# Patient Record
Sex: Female | Born: 1960 | Race: White | Hispanic: No | Marital: Single | Smoking: Current every day smoker
Health system: Southern US, Community
[De-identification: ages and names within clinical notes are randomized; demographics above are authoritative.]

## PROBLEM LIST (undated history)

## (undated) DIAGNOSIS — J449 Chronic obstructive pulmonary disease, unspecified: Secondary | ICD-10-CM

## (undated) DIAGNOSIS — N390 Urinary tract infection, site not specified: Secondary | ICD-10-CM

## (undated) DIAGNOSIS — F32A Depression, unspecified: Secondary | ICD-10-CM

## (undated) DIAGNOSIS — K219 Gastro-esophageal reflux disease without esophagitis: Secondary | ICD-10-CM

## (undated) DIAGNOSIS — F329 Major depressive disorder, single episode, unspecified: Secondary | ICD-10-CM

## (undated) DIAGNOSIS — B019 Varicella without complication: Secondary | ICD-10-CM

## (undated) DIAGNOSIS — J45909 Unspecified asthma, uncomplicated: Secondary | ICD-10-CM

## (undated) DIAGNOSIS — R51 Headache: Secondary | ICD-10-CM

## (undated) DIAGNOSIS — I10 Essential (primary) hypertension: Secondary | ICD-10-CM

## (undated) DIAGNOSIS — R519 Headache, unspecified: Secondary | ICD-10-CM

## (undated) HISTORY — DX: Unspecified asthma, uncomplicated: J45.909

## (undated) HISTORY — DX: Chronic obstructive pulmonary disease, unspecified: J44.9

## (undated) HISTORY — DX: Major depressive disorder, single episode, unspecified: F32.9

## (undated) HISTORY — PX: TONSILLECTOMY: SUR1361

## (undated) HISTORY — PX: ESOPHAGEAL DILATION: SHX303

## (undated) HISTORY — DX: Headache: R51

## (undated) HISTORY — DX: Varicella without complication: B01.9

## (undated) HISTORY — DX: Headache, unspecified: R51.9

## (undated) HISTORY — DX: Depression, unspecified: F32.A

## (undated) HISTORY — DX: Gastro-esophageal reflux disease without esophagitis: K21.9

## (undated) HISTORY — DX: Urinary tract infection, site not specified: N39.0

---

## 2003-03-16 ENCOUNTER — Encounter: Admission: RE | Admit: 2003-03-16 | Discharge: 2003-06-14 | Payer: Self-pay | Admitting: Surgical Oncology

## 2005-03-20 ENCOUNTER — Emergency Department (HOSPITAL_COMMUNITY): Admission: EM | Admit: 2005-03-20 | Discharge: 2005-03-21 | Payer: Self-pay | Admitting: *Deleted

## 2005-12-25 ENCOUNTER — Emergency Department (HOSPITAL_COMMUNITY): Admission: EM | Admit: 2005-12-25 | Discharge: 2005-12-25 | Payer: Self-pay | Admitting: Emergency Medicine

## 2005-12-25 IMAGING — CT CT HEAD W/O CM
1 series · 16 of 28 positions shown, 20 images · IV contrast (agent unspecified)
Comparison: none

CLINICAL DATA: Severe headache for a day. 
 HEAD CT WITHOUT CONTRAST:
TECHNIQUE: Contiguous axial images were obtained from the base of the skull through the vertex according to standard protocol without contrast.
 There is no evidence of intracranial hemorrhage, brain edema, or mass effect.  No other intra-axial abnormalities are seen, and the ventricles are within normal limits.  No abnormal extra-axial fluid collections or masses are identified.  No skull abnormalities are noted.

[Series 751: — · axial · 0.43mm/px · z∈[-653,-528]mm · 16 of 28 slices shown, 20 images]
[im 2/28  brain]
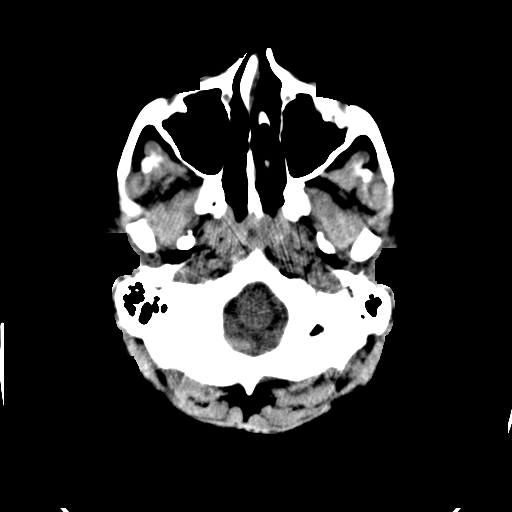
[im 2/28  bone]
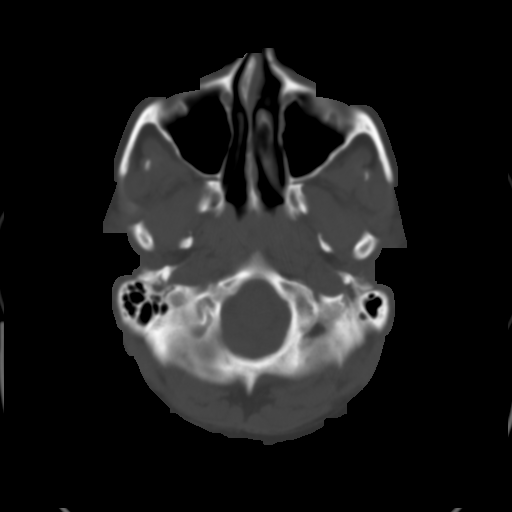
[im 4/28  brain]
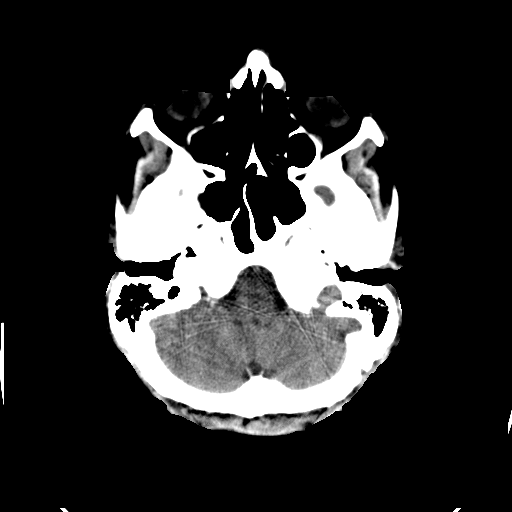
[im 6/28  brain]
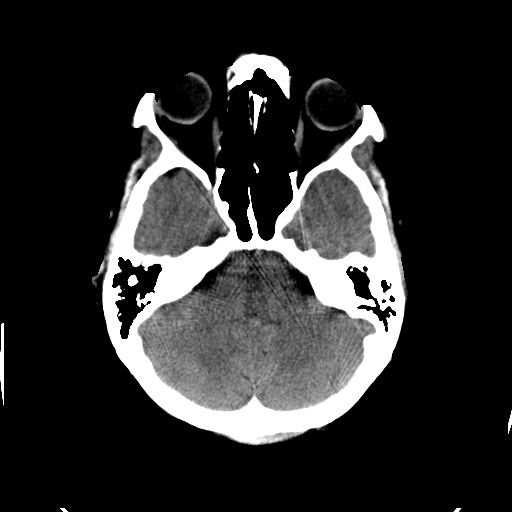
[im 7/28  brain]
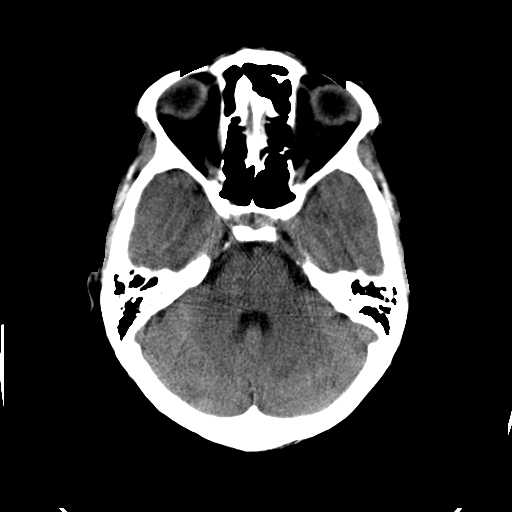
[im 9/28  brain]
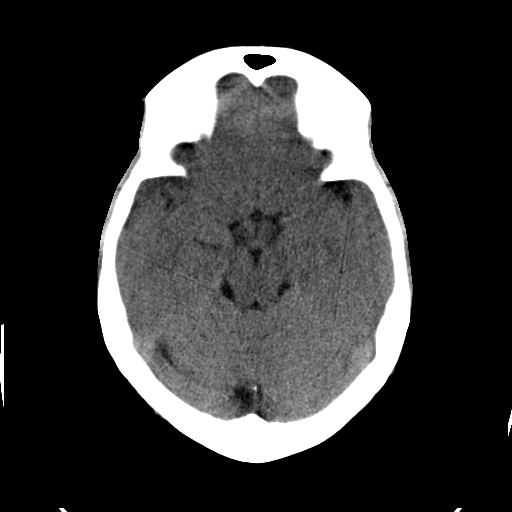
[im 9/28  bone]
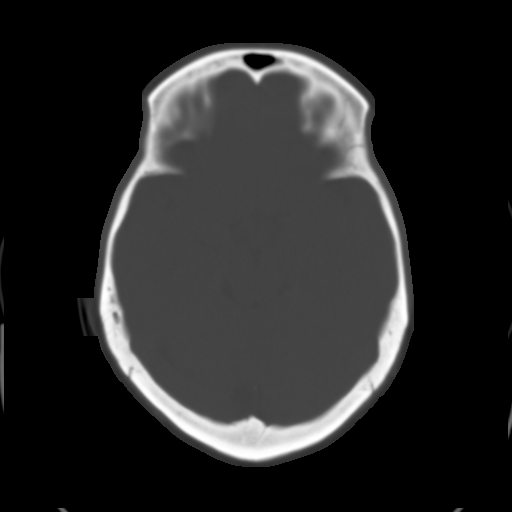
[im 10/28  brain]
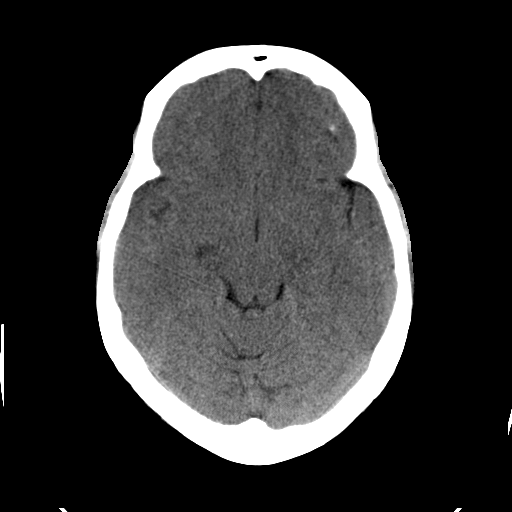
[im 12/28  brain]
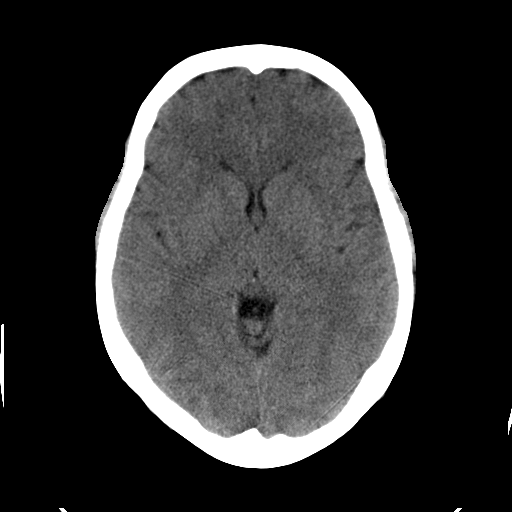
[im 14/28  brain]
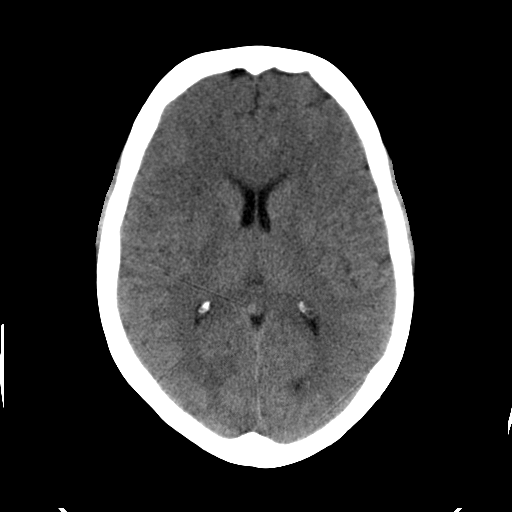
[im 15/28  brain]
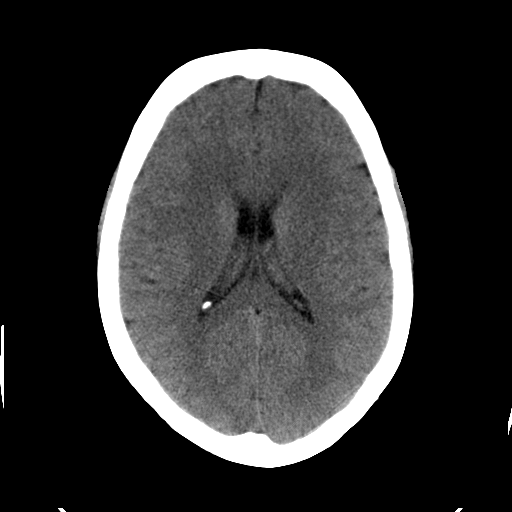
[im 15/28  bone]
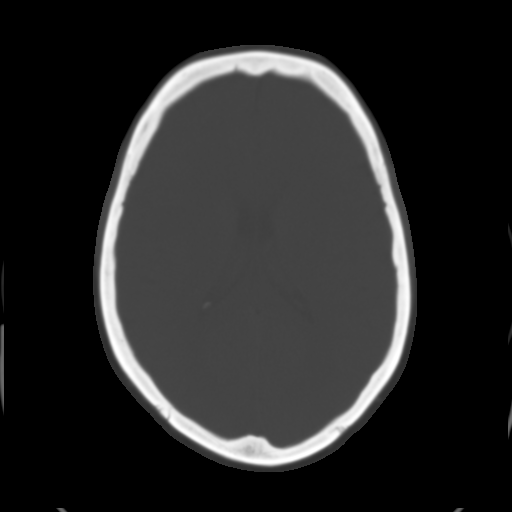
[im 17/28  brain]
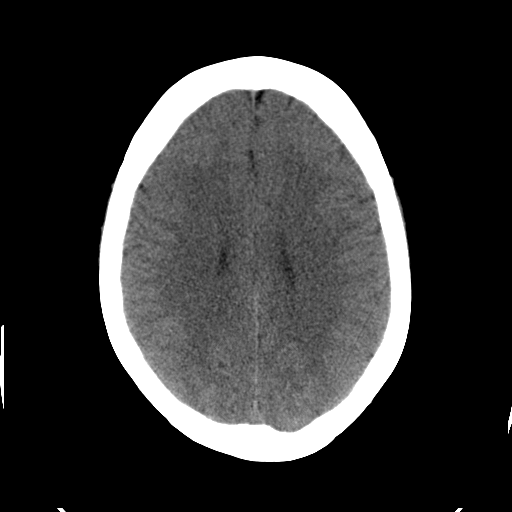
[im 19/28  brain]
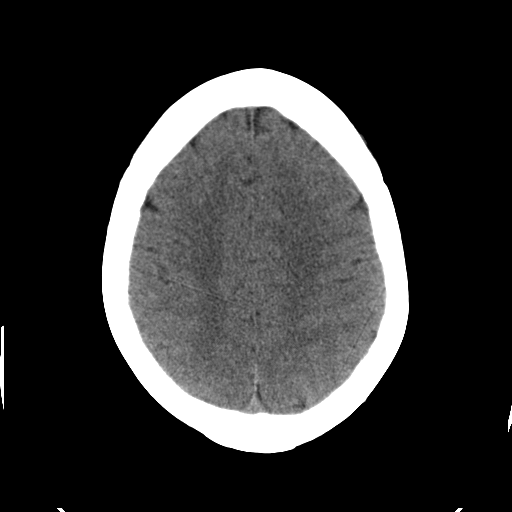
[im 20/28  brain]
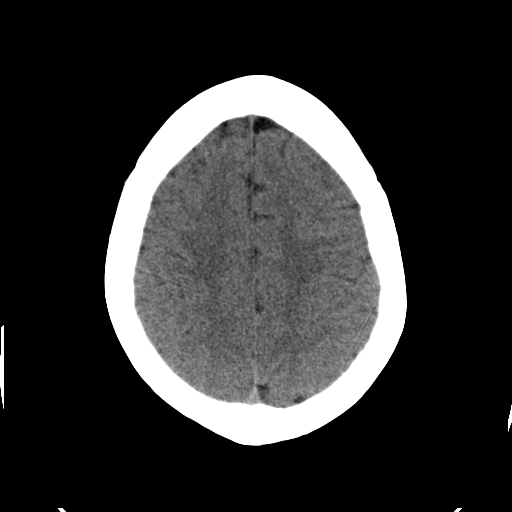
[im 22/28  brain]
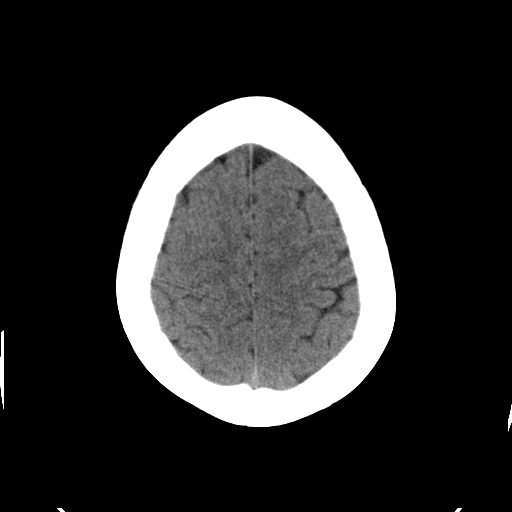
[im 22/28  bone]
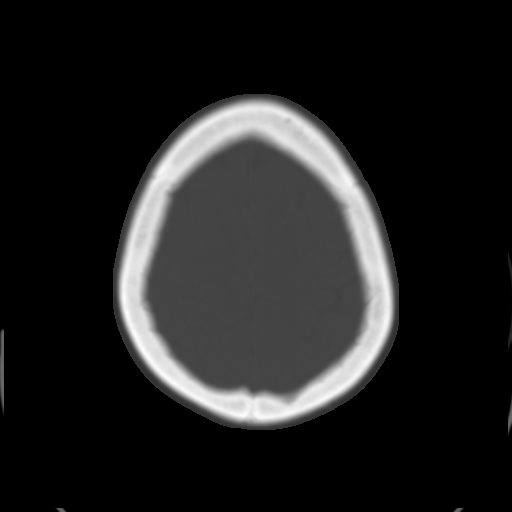
[im 23/28  brain]
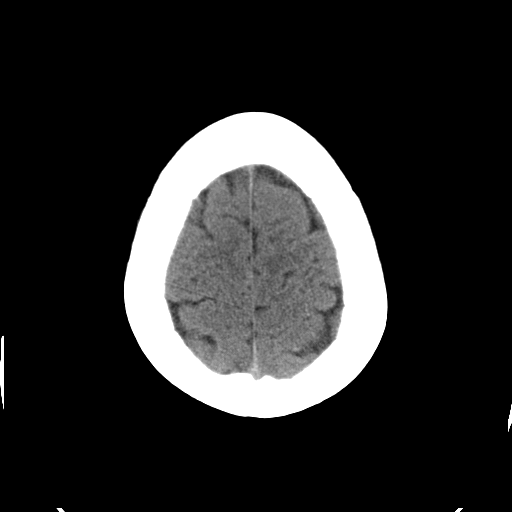
[im 25/28  brain]
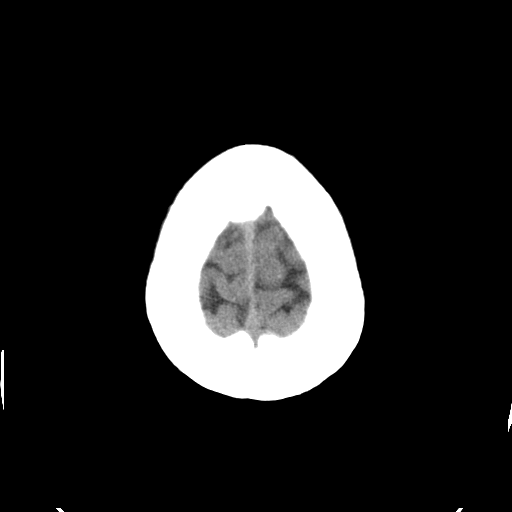
[im 27/28  brain]
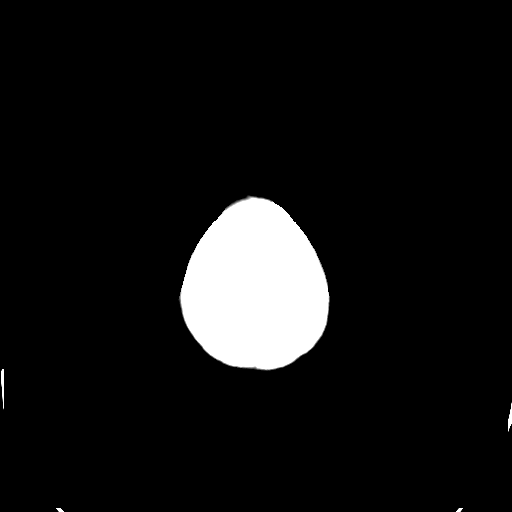

[16 of 28 positions shown; findings below may reference images not displayed]

IMPRESSION: Negative non-contrast head CT.

## 2010-12-13 ENCOUNTER — Emergency Department (HOSPITAL_COMMUNITY)
Admission: EM | Admit: 2010-12-13 | Discharge: 2010-12-13 | Disposition: A | Discharge status: Home / Self Care | Payer: Self-pay | Attending: Emergency Medicine | Admitting: Emergency Medicine

## 2010-12-13 DIAGNOSIS — H6690 Otitis media, unspecified, unspecified ear: Secondary | ICD-10-CM | POA: Insufficient documentation

## 2010-12-13 DIAGNOSIS — B850 Pediculosis due to Pediculus humanus capitis: Secondary | ICD-10-CM | POA: Insufficient documentation

## 2010-12-13 DIAGNOSIS — J45909 Unspecified asthma, uncomplicated: Secondary | ICD-10-CM | POA: Insufficient documentation

## 2010-12-13 DIAGNOSIS — R509 Fever, unspecified: Secondary | ICD-10-CM | POA: Insufficient documentation

## 2010-12-13 DIAGNOSIS — R51 Headache: Secondary | ICD-10-CM | POA: Insufficient documentation

## 2013-01-27 DIAGNOSIS — R55 Syncope and collapse: Secondary | ICD-10-CM

## 2013-10-06 ENCOUNTER — Encounter: Payer: Self-pay | Admitting: Physician Assistant

## 2013-10-06 ENCOUNTER — Ambulatory Visit (INDEPENDENT_AMBULATORY_CARE_PROVIDER_SITE_OTHER): Payer: Self-pay | Admitting: Physician Assistant

## 2013-10-06 VITALS — BP 108/72 | HR 84 | Temp 97.6°F | Resp 18 | Ht 61.5 in | Wt 167.0 lb

## 2013-10-06 DIAGNOSIS — F411 Generalized anxiety disorder: Secondary | ICD-10-CM

## 2013-10-06 DIAGNOSIS — J449 Chronic obstructive pulmonary disease, unspecified: Secondary | ICD-10-CM

## 2013-10-06 DIAGNOSIS — Z23 Encounter for immunization: Secondary | ICD-10-CM

## 2013-10-06 DIAGNOSIS — Z0000 Encounter for general adult medical examination without abnormal findings: Secondary | ICD-10-CM

## 2013-10-06 DIAGNOSIS — K219 Gastro-esophageal reflux disease without esophagitis: Secondary | ICD-10-CM

## 2013-10-06 DIAGNOSIS — Z720 Tobacco use: Secondary | ICD-10-CM

## 2013-10-06 DIAGNOSIS — F17200 Nicotine dependence, unspecified, uncomplicated: Secondary | ICD-10-CM

## 2013-10-06 MED ORDER — UMECLIDINIUM-VILANTEROL 62.5-25 MCG/INH IN AEPB: 1.0000 | INHALATION_SPRAY | Freq: Every day | RESPIRATORY_TRACT | Status: DC

## 2013-10-06 MED ORDER — SERTRALINE HCL 50 MG PO TABS: 50.0000 mg | ORAL_TABLET | Freq: Every day | ORAL | Status: DC

## 2013-10-06 MED ORDER — ALPRAZOLAM 0.25 MG PO TABS: 0.2500 mg | ORAL_TABLET | Freq: Two times a day (BID) | ORAL | Status: DC | PRN

## 2013-10-06 NOTE — Progress Notes (Signed)
Pre visit review using our clinic review tool, if applicable. No additional management support is needed unless otherwise documented below in the visit note/SLS  

## 2013-10-06 NOTE — Patient Instructions (Signed)
It was great to meet you today Michaela Wells  Labs have been ordered for you, when you report to lab please be fasting.     Chronic Obstructive Pulmonary Disease Chronic obstructive pulmonary disease (COPD) is a common lung problem. In COPD, the flow of air from the lungs is limited. The way your lungs work will probably never return to normal, but there are things you can do to improve you lungs and make yourself feel better. HOME CARE  Take all medicines as told by your doctor.  Only take over-the-counter or prescription medicines as told by your doctor.  Avoid medicines or cough syrups that dry up your airway (such as antihistamines) and do not allow you to get rid of thick spit. You do not need to avoid them if told differently by your doctor.  If you smoke, stop. Smoking makes the problem worse.  Avoid being around things that make your breathing worse (like smoke, chemicals, and fumes).  Use oxygen therapy and therapy to help improve your lungs (pulmonary rehabilitation) if told by your doctor. If you need home oxygen therapy, ask your doctor if you should buy a tool to measure your oxygen level (oximeter).  Avoid people who have a sickness you can catch (contagious).  Avoid going outside when it is very hot, cold, or humid.  Eat healthy foods. Eat smaller meals more often. Rest before meals.  Stay active, but remember to also rest.  Make sure to get all the shots (vaccines) your doctor recommends. Ask your doctor if you need a pneumonia shot.  Learn and use tips on how to relax.  Learn and use tips on how to control your breathing as told by your doctor. Try:  Breathing in (inhaling) through your nose for 1 second. Then, pucker your lips and breath out (exhale) through your lips for 2 seconds.  Putting one hand on your belly (abdomen). Breathe in slowly through your nose for 1 second. Your hand on your belly should move out. Pucker your lips and breathe out slowly through  your lips. Your hand on your belly should move in as you breathe out.  Learn and use controlled coughing to clear thick spit from your lungs. 1. Lean your head a little forward. 2. Breathe in deeply. 3. Try to hold your breath for 3 seconds. 4. Keep your mouth slightly open while coughing 2 times. 5. Spit any thick spit out into a tissue. 6. Rest and do the steps again 1 or 2 times as needed. GET HELP IF:  You cough up more thick spit than usual.  There is a change in the color or thickness of the spit.  It is harder to breathe than usual.  Your breathing is faster than usual. GET HELP RIGHT AWAY IF:   You have shortness of breath while resting.  You have shortness of breath that stops you from:  Being able to talk.  Doing normal activities.  You chest hurts for longer than 5 minutes.  Your skin color is more blue than usual.  Your pulse oximeter shows that you have low oxygen for longer than 5 minutes. MAKE SURE YOU:   Understand these instructions.  Will watch your condition.  Will get help right away if you are not doing well or get worse. Document Released: 01/29/2008 Document Revised: 06/02/2013 Document Reviewed: 04/08/2013 [facility] Patient Information 2014 [place], [place].  Generalized Anxiety Disorder Generalized anxiety disorder (GAD) is a mental disorder. It interferes with life functions, including relationships,  work, and school. GAD is different from normal anxiety, which everyone experiences at some point in their lives in response to specific life events and activities. Normal anxiety actually helps [place] prepare for and get through these life events and activities. Normal anxiety goes away after the event or activity is over.  GAD causes anxiety that is not necessarily related to specific events or activities. It also causes excess anxiety in proportion to specific events or activities. The anxiety associated with GAD is also difficult to control. GAD can  vary from mild to severe. People with severe GAD can have intense waves of anxiety with physical symptoms (panic attacks).  SYMPTOMS The anxiety and worry associated with GAD are difficult to control. This anxiety and worry are related to many life events and activities and also occur more days than not for 6 months or longer. People with GAD also have three or more of the following symptoms (one or more in children):  Restlessness.   Fatigue.  Difficulty concentrating.   Irritability.  Muscle tension.  Difficulty sleeping or unsatisfying sleep. DIAGNOSIS GAD is diagnosed through an assessment by your caregiver. Your caregiver will ask you questions aboutyour mood,physical symptoms, and events in your life. Your caregiver may ask you about your medical history and use of alcohol or drugs, including prescription medications. Your caregiver may also do a physical exam and blood tests. Certain medical conditions and the use of certain substances can cause symptoms similar to those associated with GAD. Your caregiver may refer you to a mental health specialist for further evaluation. TREATMENT The following therapies are usually used to treat GAD:   Medication Antidepressant medication usually is prescribed for long-term daily control. Antianxiety medications may be added in severe cases, especially when panic attacks occur.   Talk therapy (psychotherapy) Certain types of talk therapy can be helpful in treating GAD by providing support, education, and guidance. A form of talk therapy called cognitive behavioral therapy can teach you healthy ways to think about and react to daily life events and activities.  Stress managementtechniques These include yoga, meditation, and exercise and can be very helpful when they are practiced regularly. A mental health specialist can help determine which treatment is best for you. Some people see improvement with one therapy. However, other people require  a combination of therapies. Document Released: 12/07/2012 Document Reviewed: 12/07/2012 [facility] Patient Information 2014 [place], [place].     Health Maintenance, Female A healthy lifestyle and preventative care can promote health and wellness.  Maintain regular health, dental, and eye exams.  Eat a healthy diet. Foods like vegetables, fruits, whole grains, low-fat dairy products, and lean protein foods contain the nutrients you need without too many calories. Decrease your intake of foods high in solid fats, added sugars, and salt. Get information about a proper diet from your caregiver, if necessary.  Regular physical exercise is one of the most important things you can do for your health. Most adults should get at least 150 minutes of moderate-intensity exercise (any activity that increases your heart rate and causes you to sweat) each week. In addition, most adults need muscle-strengthening exercises on 2 or more days a week.   Maintain a healthy weight. The body mass index (BMI) is a screening tool to identify possible weight problems. It provides an estimate of body fat based on height and weight. Your caregiver can help determine your BMI, and can help you achieve or maintain a healthy weight. For adults 20 years and older:  A BMI below 18.5 is considered underweight.  A BMI of 18.5 to 24.9 is normal.  A BMI of 25 to 29.9 is considered overweight.  A BMI of 30 and above is considered obese.  Maintain normal blood lipids and cholesterol by exercising and minimizing your intake of saturated fat. Eat a balanced diet with plenty of fruits and vegetables. Blood tests for lipids and cholesterol should begin at age 15 and be repeated every 5 years. If your lipid or cholesterol levels are high, you are over 50, or you are a high risk for heart disease, you may need your cholesterol levels checked more frequently.Ongoing high lipid and cholesterol levels should be treated with medicines if  diet and exercise are not effective.  If you smoke, find out from your caregiver how to quit. If you do not use tobacco, do not start.  Lung cancer screening is recommended for adults aged 62 80 years who are at high risk for developing lung cancer because of a history of smoking. Yearly low-dose computed tomography (CT) is recommended for people who have at least a 30-pack-year history of smoking and are a current smoker or have quit within the past 15 years. A pack year of smoking is smoking an average of 1 pack of cigarettes a day for 1 year (for example: 1 pack a day for 30 years or 2 packs a day for 15 years). Yearly screening should continue until the smoker has stopped smoking for at least 15 years. Yearly screening should also be stopped for people who develop a health problem that would prevent them from having lung cancer treatment.  If you are pregnant, do not drink alcohol. If you are breastfeeding, be very cautious about drinking alcohol. If you are not pregnant and choose to drink alcohol, do not exceed 1 drink per day. One drink is considered to be 12 ounces (355 mL) of beer, 5 ounces (148 mL) of wine, or 1.5 ounces (44 mL) of liquor.  Avoid use of street drugs. Do not share needles with anyone. Ask for help if you need support or instructions about stopping the use of drugs.  High blood pressure causes heart disease and increases the risk of stroke. Blood pressure should be checked at least every 1 to 2 years. Ongoing high blood pressure should be treated with medicines, if weight loss and exercise are not effective.  If you are 69 to 54 years old, ask your caregiver if you should take aspirin to prevent strokes.  Diabetes screening involves taking a blood sample to check your fasting blood sugar level. This should be done once every 3 years, after age 53, if you are within normal weight and without risk factors for diabetes. Testing should be considered at a younger age or be carried  out more frequently if you are overweight and have at least 1 risk factor for diabetes.  Breast cancer screening is essential preventative care for women. You should practice "breast self-awareness." This means understanding the normal appearance and feel of your breasts and may include breast self-examination. Any changes detected, no matter how small, should be reported to a caregiver. Women in their 36s and 30s should have a clinical breast exam (CBE) by a caregiver as part of a regular health exam every 1 to 3 years. After age 28, women should have a CBE every year. Starting at age 27, women should consider having a mammogram (breast X-ray) every year. Women who have a family history of breast cancer  should talk to their caregiver about genetic screening. Women at a high risk of breast cancer should talk to their caregiver about having an MRI and a mammogram every year.  Breast cancer gene (BRCA)-related cancer risk assessment is recommended for women who have family members with BRCA-related cancers. BRCA-related cancers include breast, ovarian, tubal, and peritoneal cancers. Having family members with these cancers may be associated with an increased risk for harmful changes (mutations) in the breast cancer genes BRCA1 and BRCA2. Results of the assessment will determine the need for genetic counseling and BRCA1 and BRCA2 testing.  The Pap test is a screening test for cervical cancer. Women should have a Pap test starting at age 47. Between ages 49 and 61, Pap tests should be repeated every 2 years. Beginning at age 13, you should have a Pap test every 3 years as long as the past 3 Pap tests have been normal. If you had a hysterectomy for a problem that was not cancer or a condition that could lead to cancer, then you no longer need Pap tests. If you are between ages 9 and 48, and you have had normal Pap tests going back 10 years, you no longer need Pap tests. If you have had past treatment for cervical  cancer or a condition that could lead to cancer, you need Pap tests and screening for cancer for at least 20 years after your treatment. If Pap tests have been discontinued, risk factors (such as a new sexual partner) need to be reassessed to determine if screening should be resumed. Some women have medical problems that increase the chance of getting cervical cancer. In these cases, your caregiver may recommend more frequent screening and Pap tests.  The human papillomavirus (HPV) test is an additional test that may be used for cervical cancer screening. The HPV test looks for the virus that can cause the cell changes on the cervix. The cells collected during the Pap test can be tested for HPV. The HPV test could be used to screen women aged 62 years and older, and should be used in women of any age who have unclear Pap test results. After the age of 98, women should have HPV testing at the same frequency as a Pap test.  Colorectal cancer can be detected and often prevented. Most routine colorectal cancer screening begins at the age of 80 and continues through age 36. However, your caregiver may recommend screening at an earlier age if you have risk factors for colon cancer. On a yearly basis, your caregiver may provide home test kits to check for hidden blood in the stool. Use of a small camera at the end of a tube, to directly examine the colon (sigmoidoscopy or colonoscopy), can detect the earliest forms of colorectal cancer. Talk to your caregiver about this at age 64, when routine screening begins. Direct examination of the colon should be repeated every 5 to 10 years through age 77, unless early forms of pre-cancerous polyps or small growths are found.  Hepatitis C blood testing is recommended for all people born from [birth year] through 1966 and any individual with known risks for hepatitis C.  Practice safe sex. Use condoms and avoid high-risk sexual practices to reduce the spread of sexually transmitted  infections (STIs). Sexually active women aged 58 and younger should be checked for Chlamydia, which is a common sexually transmitted infection. Older women with new or multiple partners should also be tested for Chlamydia. Testing for other STIs is recommended if  you are sexually active and at increased risk.  Osteoporosis is a disease in which the bones lose minerals and strength with aging. This can result in serious bone fractures. The risk of osteoporosis can be identified using a bone density scan. Women ages 20 and over and women at risk for fractures or osteoporosis should discuss screening with their caregivers. Ask your caregiver whether you should be taking a calcium supplement or vitamin D to reduce the rate of osteoporosis.  Menopause can be associated with physical symptoms and risks. Hormone replacement therapy is available to decrease symptoms and risks. You should talk to your caregiver about whether hormone replacement therapy is right for you.  Use sunscreen. Apply sunscreen liberally and repeatedly throughout the day. You should seek shade when your shadow is shorter than you. Protect yourself by wearing long sleeves, pants, a wide-brimmed hat, and sunglasses year round, whenever you are outdoors.  Notify your caregiver of new moles or changes in moles, especially if there is a change in shape or color. Also notify your caregiver if a mole is larger than the size of a pencil eraser.  Stay current with your immunizations. Document Released: 02/25/2011 Document Revised: 12/07/2012 Document Reviewed: 02/25/2011 [facility] Patient Information 2014 [place].

## 2013-10-07 ENCOUNTER — Telehealth: Payer: Self-pay | Admitting: *Deleted

## 2013-10-07 NOTE — Telephone Encounter (Signed)
Consulted with Baltazar ApoNancy Hartman, PA-C, no "cough syrup" was intended-only her maintenance and her rescue inhalers.  Attempted to phone patient back, left message educating patient on differences between "maintenance inhaler" and "rescue inhalers".

## 2013-10-07 NOTE — Telephone Encounter (Signed)
Patient phoned triage line at 1720 yesterday, stating she had just seen Baltazar ApoNancy Hartman in [place] and was provided 3 prescriptions & was under the understanding that Harriett Sineancy was prescribing something for her cough.  Upon reviewing MAR & newly prescribed meds, there are 2 inhalers listed.  Weren't these the meds for her cough or did you intend to prescribe additional?  Please advise.   CB# [phone]

## 2013-10-09 DIAGNOSIS — Z720 Tobacco use: Secondary | ICD-10-CM | POA: Insufficient documentation

## 2013-10-09 DIAGNOSIS — J449 Chronic obstructive pulmonary disease, unspecified: Secondary | ICD-10-CM | POA: Insufficient documentation

## 2013-10-09 DIAGNOSIS — K219 Gastro-esophageal reflux disease without esophagitis: Secondary | ICD-10-CM | POA: Insufficient documentation

## 2013-10-09 DIAGNOSIS — F411 Generalized anxiety disorder: Secondary | ICD-10-CM | POA: Insufficient documentation

## 2013-10-09 NOTE — Assessment & Plan Note (Signed)
Spend 5 minutes of overall visit discussing diet, exercise and triggers. Patient will continue use of TUMS for now.

## 2013-10-09 NOTE — Assessment & Plan Note (Signed)
Spent 5 minutes of overall visit discussing smoking cessation and the benefits to overall health and current medical conditions.  Patient is wanting to quit, has decreased amount in last couple weeks. Will continue to work hard.  Encouraged patient for effort.

## 2013-10-09 NOTE — Assessment & Plan Note (Addendum)
Rx for Zoloft 50 mg one tab daily and Xanax 0.25 mg one tab twice daily as needed. Counseled patient on use of medication and explained Xanax prescription would be short term until Zoloft had time to take full effect. Counseled patient on Toxicology Assurance program Patient to RTO in two months for evaluation of anxiety.

## 2013-10-09 NOTE — Assessment & Plan Note (Signed)
Anora, 1 puff daily. Samples provided for 72 days.

## 2013-10-09 NOTE — Progress Notes (Signed)
Patient ID: Michaela PaneRhonda L Wells is a 53 y.o. female DOB: [phone] MRN: [id number]     HPI:  Patient is a 53 year old female who presents to the office to establish care. Patient reports has not been to the PCP in nearly 13 years. Has smoked for over 30 years. Recently seen in local ED with sinus issues and SOB. Treated for sinus infection and COPD. Placed on antibiotics, completed, a cough suppressant and an albuterol inhaler which is almost gone. States has never taken care of herself and is very worried about her health. Reports she is depressed and anxious over her children, two girls in prison, one daughter always truant in school, and now patient has to go to court because of truancy. Has not been to GYN since 2001, has not had recommended screenings or immunizations in years. Would like to quit smoking, is working on weaning herself off since recent diagnosis of COPD. Smokes only 10 cigarettes a day however, experiences anxiety over quitting because smoked for so long. History of GERD uses Tums for symptom relief. Denies chest pain/palpitations, fever, change in activity/appetite, change in bowel/bladder habits, visual change/disturbance, extremity swelling, numbness, tingling or weakness.   Influenza: no Tetanus: not for a long time PAP:2001 LMP: 2009 Mammogram: 1997 Colonoscopy: never   ROS: As stated in HPI. All other systems negative  Past Medical History  Diagnosis Date  . COPD (chronic obstructive pulmonary disease)   . Bronchial asthma   . Chicken pox   . Depression   . Frequent headaches   . UTI (lower urinary tract infection)   . GERD (gastroesophageal reflux disease)    Family History  Problem Relation Age of Onset  . Other Mother     MVA-Deceased [pt was age 25]  . COPD Father     Deceased  . Heart attack Maternal Grandmother   . Stroke Maternal Grandmother   . Brain cancer Maternal Grandmother   . Arthritis/Rheumatoid Maternal Grandmother   . Heart attack  Maternal Grandfather   . Stroke Maternal Grandfather   . Heart disease Maternal Aunt   . Stroke Maternal Aunt     #1  . Heart attack Maternal Aunt     #1  . Hypertension Maternal Aunt     #1  . Arthritis/Rheumatoid Sister   . COPD Sister   . Heart disease Maternal Uncle   . Heart attack Maternal Uncle     x4  . Heart disease Brother     #1  . Heart attack Brother     #1  . Diabetes Brother   . Diabetes Maternal Aunt   . Mental retardation Maternal Aunt   . Healthy Daughter     x2  . Drug abuse Daughter     #1  . Drug abuse Daughter     #2  . Kidney Stones Daughter     #2  . Other Son     Agoraphobia   History   Social History  . Marital Status: Single    Spouse Name: N/A    Number of Children: N/A  . Years of Education: N/A   Social History Main Topics  . Smoking status: Current Every Day Smoker  . Smokeless tobacco: Not on file  . Alcohol Use: Not on file  . Drug Use: Not on file  . Sexual Activity: Not on file   Other Topics Concern  . Not on file   Social History Narrative  . No narrative on file  Past Surgical History  Procedure Laterality Date  . Tonsillectomy    . Esophageal dilation     No current outpatient prescriptions on file prior to visit.   No current facility-administered medications on file prior to visit.   Allergies  Allergen Reactions  . Penicillins     PE: CONSTITUTIONAL: Well developed, well nourished, pleasant, appears older than stated age, appears anxious. Thumbs tucked into clenched fists bilateral. HEENT: normocephalic, atraumatic, bilateral ext/int canals normal. Bilateral TM's without injections, bulging, erythema. Nose normal, uvula midline, oropharynx clear and moist. Poor dentition throughout, multiple caries. EYES: PERRLA, bilateral EOM and conjunctiva normal NECK: FROM, supple, without thyromegaly or mass CARDIO: RRR, normal S1 and S2, distal pulses intact. Extremities without edema. PULM/CHEST CTA bilateral,  no wheezes, rales or rhonchi. Non tender. ABD: appearance normal, soft, nontender. Normal bowel sounds x 4 quadrants GU: deferred to GYN. MUSC: FROM U/LE bilateral LYMPH: no cervical, supraclavicular adenopathy NEURO: alert and oriented x 3, no cranial nerve deficit, motor strength and coordination NL. Negative romberg. Gait normal. SKIN: warm, dry, no rash or lesions noted. PSYCH: Mood and affect anxious, speech normal.    ASSESSMENT and PLAN  CPX/v70.0 - Patient has been counseled on age-appropriate routine health concerns for screening and prevention. These are reviewed and up-to-date. Immunizations are up-to-date or declined. Labs ordered and will be reviewed.  Tdap update today.  COPD: Anora, 1 puff daily. Samples provided for 72 days.  GAD: Rx for Zoloft and Xanax. Counseled patient on use of medication and explained Xanax prescription would be short term until Zoloft had time to take full effect. Counseled patient on Toxicology Assurance program Patient to RTO in 2 months for evaluation of anxiety.  GERD: Spend 5 minutes of overall visit discussing diet, exercise and triggers. Patient will continue use of TUMS for now.  Smoking: Spent 5 minutes of overall visit discussing smoking cessation and the benefits to overall health and current medical conditions.  Patient is wanting to quit, has decreased amount in last couple weeks. Will continue to work hard.  Encouraged patient for effort.

## 2013-10-12 ENCOUNTER — Encounter: Payer: Self-pay | Admitting: Obstetrics and Gynecology

## 2013-10-14 ENCOUNTER — Encounter: Payer: Self-pay | Admitting: Physician Assistant

## 2013-10-20 ENCOUNTER — Other Ambulatory Visit: Payer: Self-pay | Admitting: *Deleted

## 2013-10-20 ENCOUNTER — Telehealth: Payer: Self-pay | Admitting: Physician Assistant

## 2013-10-20 MED ORDER — ALBUTEROL SULFATE HFA 108 (90 BASE) MCG/ACT IN AERS: 1.0000 | INHALATION_SPRAY | RESPIRATORY_TRACT | Status: DC | PRN

## 2013-10-20 NOTE — Telephone Encounter (Signed)
Patient wants to know if she can use Nicoderm patches that she bought at the store to help her stop smoking. States that on the package is says to ask her PCP before using.  Patient also wants to know if she can have an inhaler to assist her with her breathing issues. Please advise.

## 2013-10-20 NOTE — Telephone Encounter (Signed)
Pt needs an inhaler that she can carry around and use whenever she needs it. Whether it be once a day or multiple times a day. Pt also wanted to know if she can use more than one inhaler together.

## 2013-11-19 ENCOUNTER — Encounter: Payer: Self-pay | Admitting: Medical

## 2013-12-07 ENCOUNTER — Ambulatory Visit: Payer: Self-pay | Admitting: Physician Assistant

## 2014-11-07 DIAGNOSIS — F1210 Cannabis abuse, uncomplicated: Secondary | ICD-10-CM | POA: Insufficient documentation

## 2014-11-07 DIAGNOSIS — F419 Anxiety disorder, unspecified: Secondary | ICD-10-CM | POA: Insufficient documentation

## 2014-11-07 DIAGNOSIS — F32A Depression, unspecified: Secondary | ICD-10-CM | POA: Diagnosis present

## 2015-01-24 ENCOUNTER — Other Ambulatory Visit (HOSPITAL_COMMUNITY)
Admission: RE | Admit: 2015-01-24 | Discharge: 2015-01-24 | Disposition: A | Discharge status: Home / Self Care | Payer: Self-pay | Source: Ambulatory Visit | Attending: Family Medicine | Admitting: Family Medicine

## 2015-01-24 DIAGNOSIS — Z01419 Encounter for gynecological examination (general) (routine) without abnormal findings: Secondary | ICD-10-CM | POA: Insufficient documentation

## 2015-01-24 DIAGNOSIS — Z1151 Encounter for screening for human papillomavirus (HPV): Secondary | ICD-10-CM | POA: Insufficient documentation

## 2018-01-04 ENCOUNTER — Encounter (HOSPITAL_COMMUNITY): Payer: Self-pay | Admitting: Emergency Medicine

## 2018-01-04 ENCOUNTER — Emergency Department (HOSPITAL_COMMUNITY): Payer: Self-pay

## 2018-01-04 ENCOUNTER — Emergency Department (HOSPITAL_COMMUNITY)
Admission: EM | Admit: 2018-01-04 | Discharge: 2018-01-04 | Disposition: A | Discharge status: Home / Self Care | Payer: Self-pay | Attending: Emergency Medicine | Admitting: Emergency Medicine

## 2018-01-04 DIAGNOSIS — Y9389 Activity, other specified: Secondary | ICD-10-CM | POA: Insufficient documentation

## 2018-01-04 DIAGNOSIS — F319 Bipolar disorder, unspecified: Secondary | ICD-10-CM

## 2018-01-04 DIAGNOSIS — Y999 Unspecified external cause status: Secondary | ICD-10-CM | POA: Insufficient documentation

## 2018-01-04 DIAGNOSIS — R51 Headache: Secondary | ICD-10-CM | POA: Insufficient documentation

## 2018-01-04 DIAGNOSIS — S82891A Other fracture of right lower leg, initial encounter for closed fracture: Secondary | ICD-10-CM

## 2018-01-04 DIAGNOSIS — Y92008 Other place in unspecified non-institutional (private) residence as the place of occurrence of the external cause: Secondary | ICD-10-CM | POA: Insufficient documentation

## 2018-01-04 DIAGNOSIS — S20211A Contusion of right front wall of thorax, initial encounter: Secondary | ICD-10-CM | POA: Insufficient documentation

## 2018-01-04 DIAGNOSIS — W1789XA Other fall from one level to another, initial encounter: Secondary | ICD-10-CM | POA: Insufficient documentation

## 2018-01-04 DIAGNOSIS — F22 Delusional disorders: Secondary | ICD-10-CM

## 2018-01-04 DIAGNOSIS — J449 Chronic obstructive pulmonary disease, unspecified: Secondary | ICD-10-CM | POA: Insufficient documentation

## 2018-01-04 DIAGNOSIS — Z79899 Other long term (current) drug therapy: Secondary | ICD-10-CM | POA: Insufficient documentation

## 2018-01-04 DIAGNOSIS — S298XXA Other specified injuries of thorax, initial encounter: Secondary | ICD-10-CM

## 2018-01-04 DIAGNOSIS — W19XXXA Unspecified fall, initial encounter: Secondary | ICD-10-CM

## 2018-01-04 DIAGNOSIS — F17200 Nicotine dependence, unspecified, uncomplicated: Secondary | ICD-10-CM | POA: Insufficient documentation

## 2018-01-04 LAB — BASIC METABOLIC PANEL
Anion gap: 11 (ref 5–15)
BUN: 7 mg/dL (ref 6–20)
CHLORIDE: 108 mmol/L (ref 101–111)
CO2: 23 mmol/L (ref 22–32)
CREATININE: 1 mg/dL (ref 0.44–1.00)
Calcium: 7.9 mg/dL — ABNORMAL LOW (ref 8.9–10.3)
GFR calc non Af Amer: >60 mL/min (ref >60)
Glucose, Bld: 101 mg/dL — ABNORMAL HIGH (ref 65–99)
POTASSIUM: 3.3 mmol/L — AB (ref 3.5–5.1)
Sodium: 142 mmol/L (ref 135–145)

## 2018-01-04 LAB — CBC WITH DIFFERENTIAL/PLATELET
BASOS ABS: 0 10*3/uL (ref 0.0–0.1)
Basophils Relative: 0 %
EOS PCT: 3 %
Eosinophils Absolute: 0.3 10*3/uL (ref 0.0–0.7)
HEMATOCRIT: 37.7 % (ref 36.0–46.0)
HEMOGLOBIN: 11.8 g/dL — AB (ref 12.0–15.0)
LYMPHS ABS: 2.2 10*3/uL (ref 0.7–4.0)
LYMPHS PCT: 28 %
MCH: 29.1 pg (ref 26.0–34.0)
MCHC: 31.3 g/dL (ref 30.0–36.0)
MCV: 93.1 fL (ref 78.0–100.0)
Monocytes Absolute: 0.9 10*3/uL (ref 0.1–1.0)
Monocytes Relative: 11 %
NEUTROS ABS: 4.3 10*3/uL (ref 1.7–7.7)
Neutrophils Relative %: 58 %
PLATELETS: 414 10*3/uL — AB (ref 150–400)
RBC: 4.05 MIL/uL (ref 3.87–5.11)
RDW: 14.4 % (ref 11.5–15.5)
WBC: 7.6 10*3/uL (ref 4.0–10.5)

## 2018-01-04 LAB — RAPID URINE DRUG SCREEN, HOSP PERFORMED
AMPHETAMINES: NOT DETECTED
Barbiturates: NOT DETECTED
Benzodiazepines: POSITIVE — AB
Cocaine: NOT DETECTED
OPIATES: NOT DETECTED
TETRAHYDROCANNABINOL: POSITIVE — AB

## 2018-01-04 LAB — ETHANOL

## 2018-01-04 LAB — URINALYSIS, ROUTINE W REFLEX MICROSCOPIC
Bilirubin Urine: NEGATIVE
Glucose, UA: NEGATIVE mg/dL
Hgb urine dipstick: NEGATIVE
Ketones, ur: NEGATIVE mg/dL
Nitrite: NEGATIVE
PH: 5 (ref 5.0–8.0)
Protein, ur: NEGATIVE mg/dL
Specific Gravity, Urine: 1.021 (ref 1.005–1.030)

## 2018-01-04 IMAGING — DX DG ANKLE COMPLETE 3+V*R*
3 series · 3 of 3 positions shown · non-contrast
Comparison: None.

CLINICAL DATA: Acute RIGHT ankle pain following fall. Initial
encounter.

EXAM:
RIGHT ANKLE - COMPLETE 3+ VIEW

[x ankle ap right]
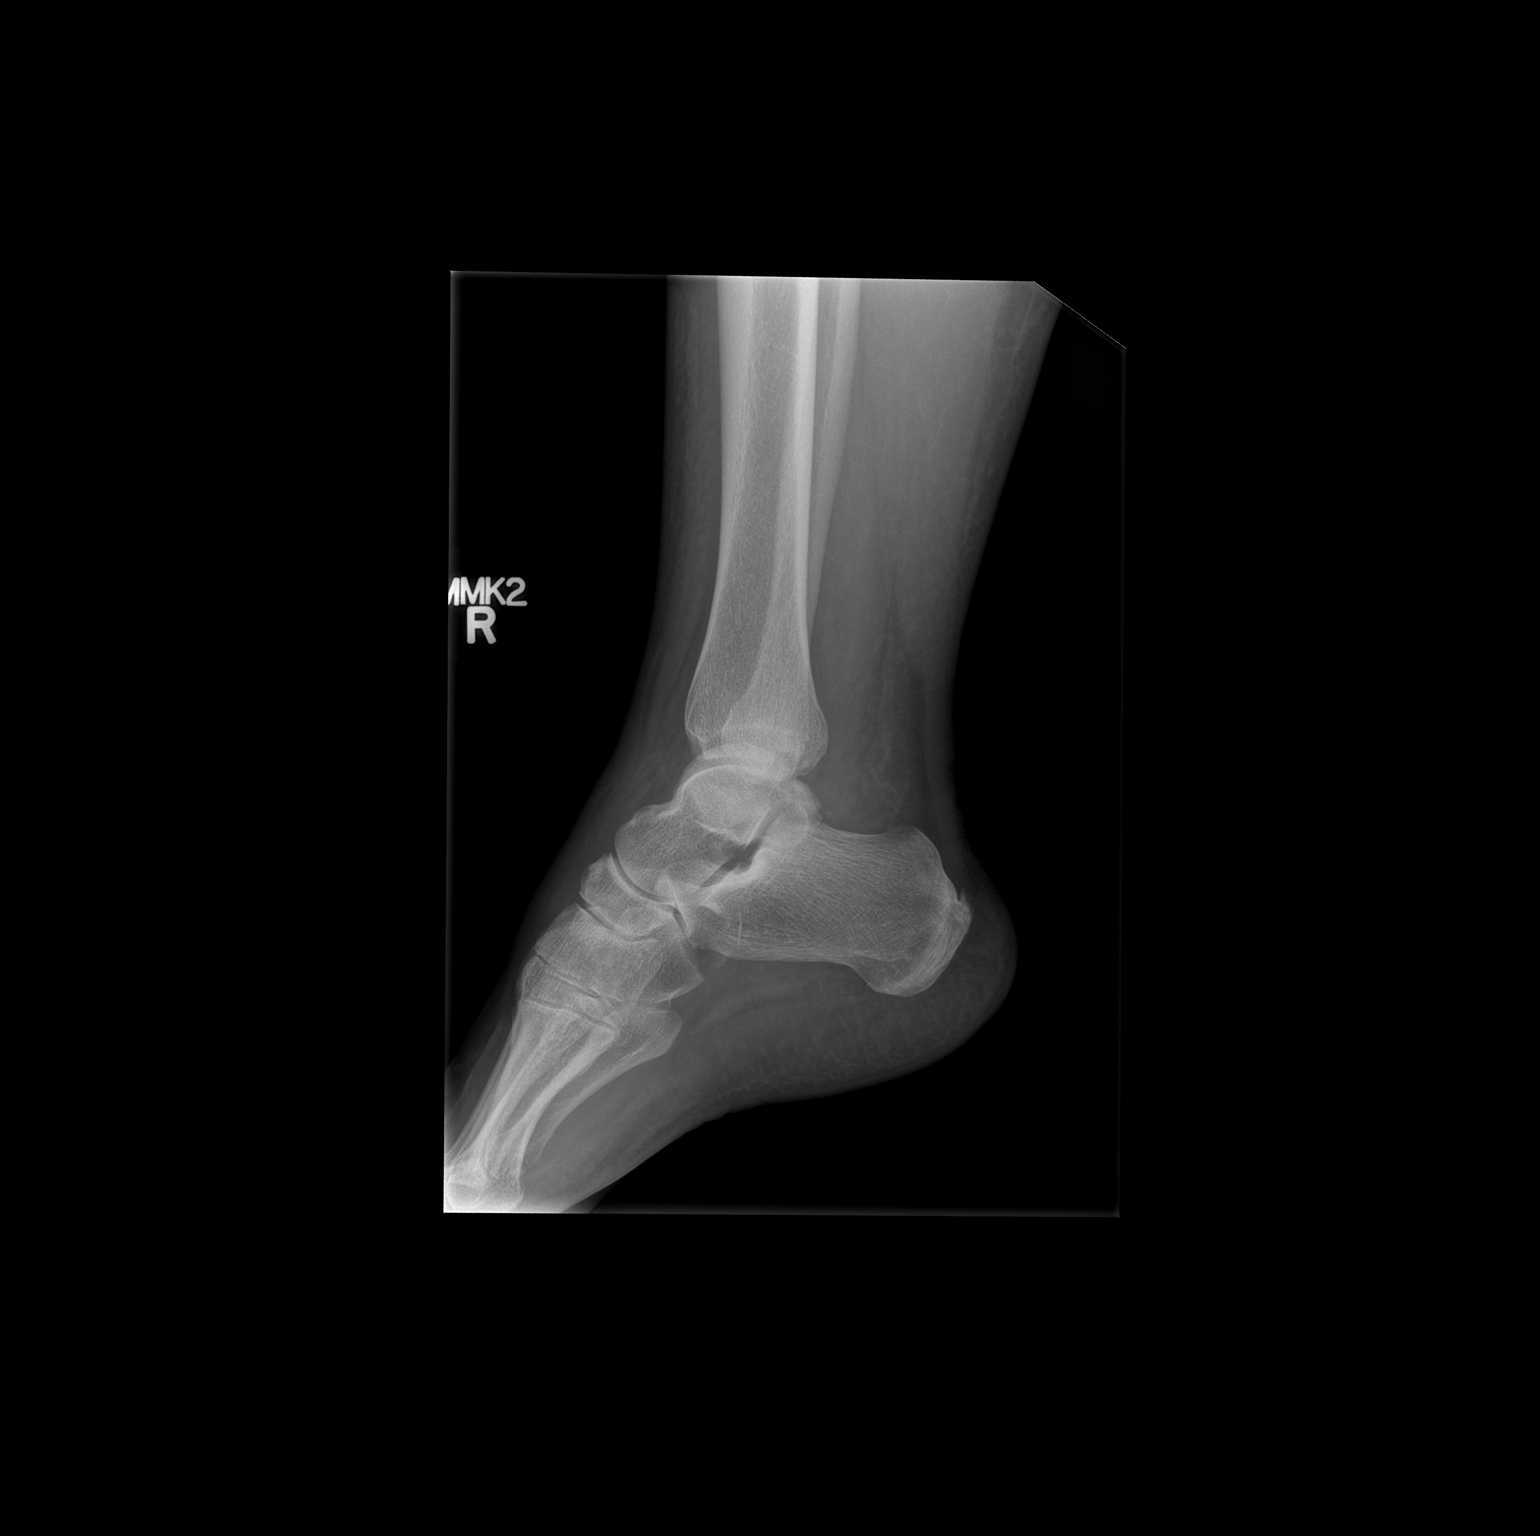

[x ankle obl right]
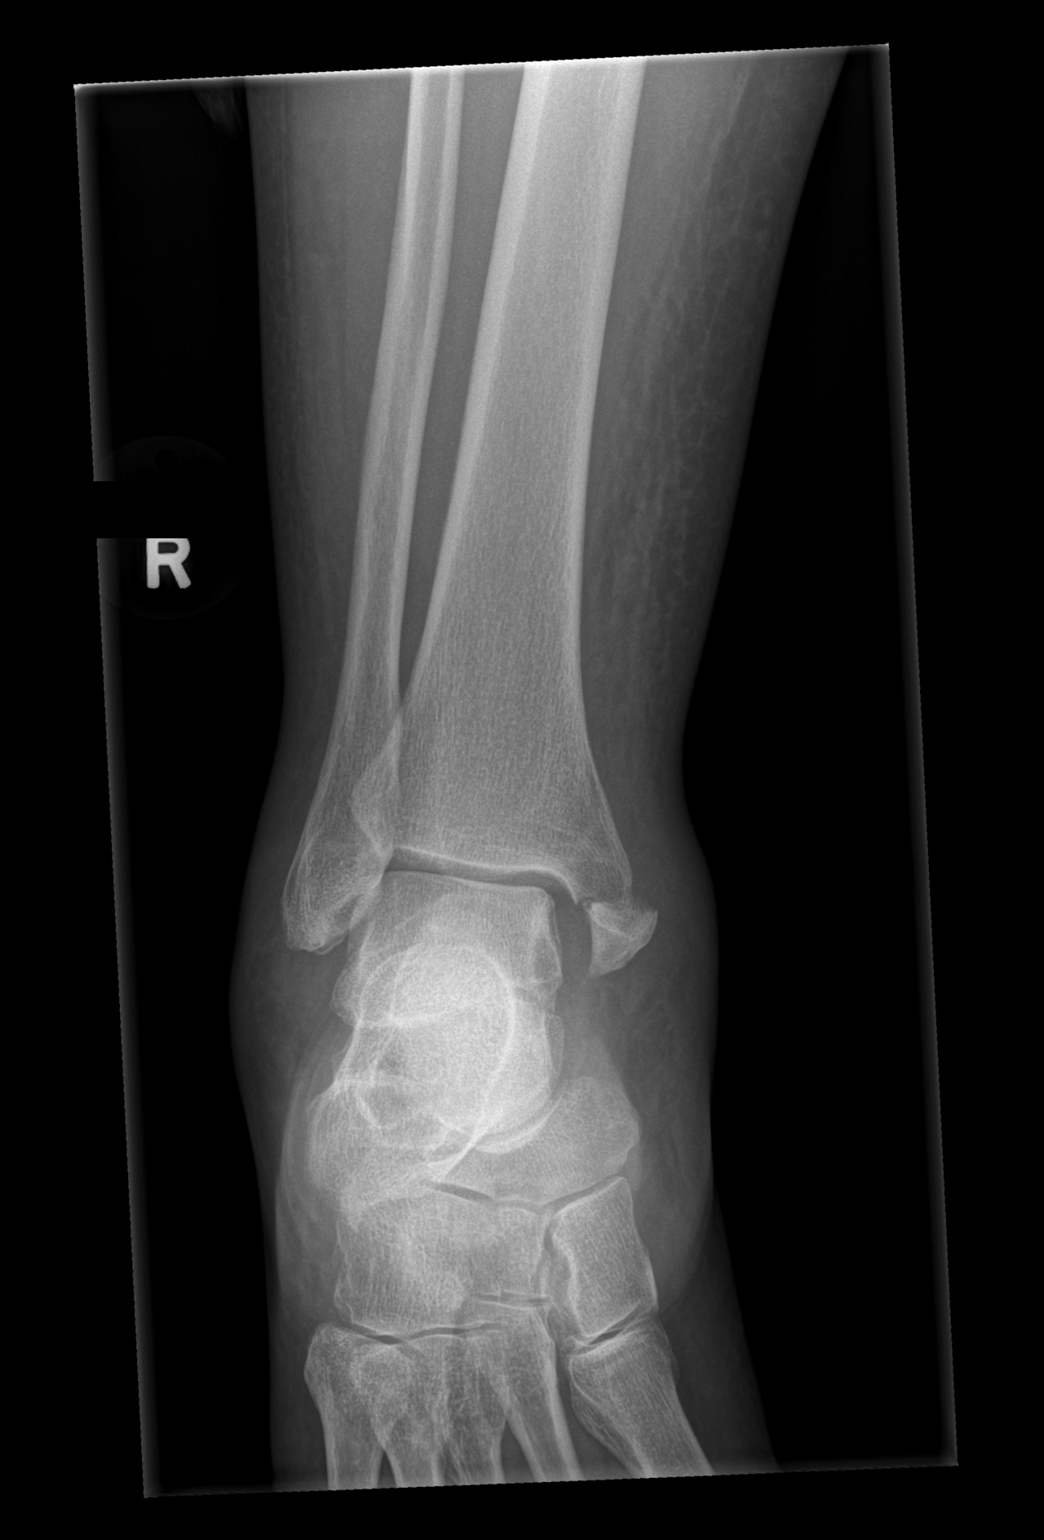

[x ankle lat right]
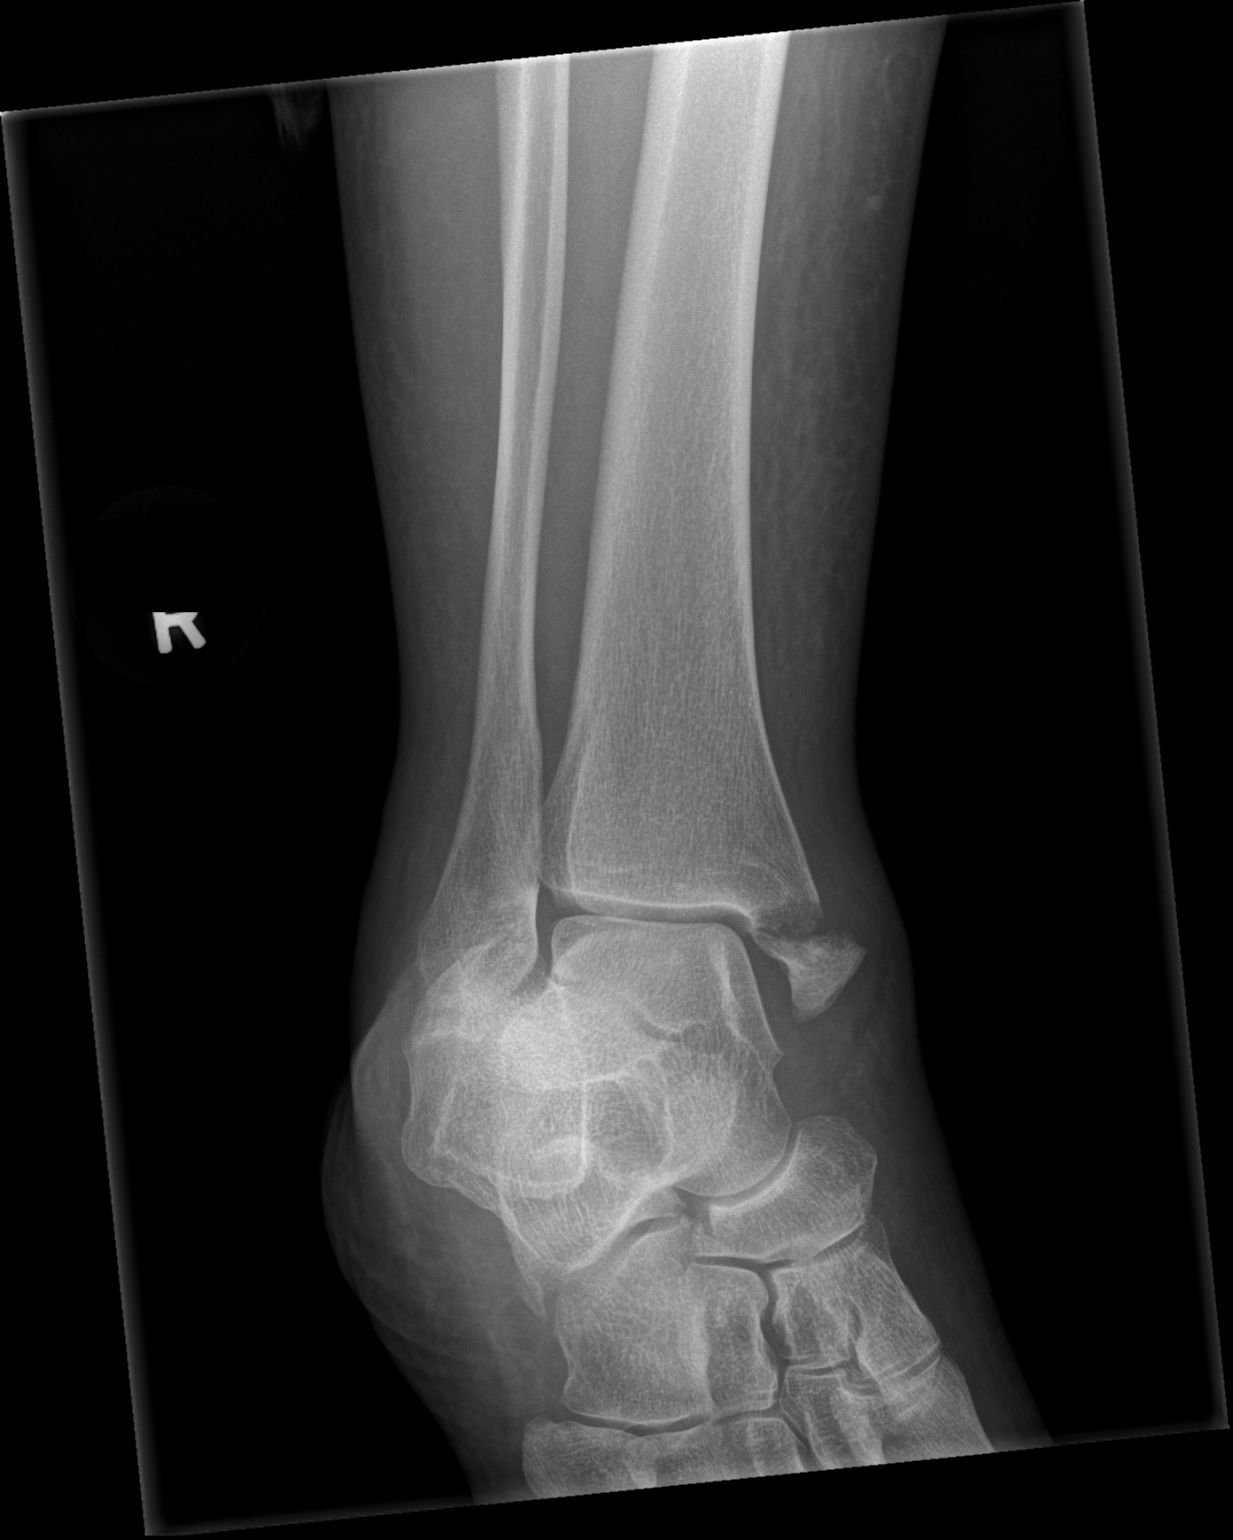

[3 of 3 positions shown; findings below may reference images not displayed]

FINDINGS: A transverse fracture of the MEDIAL malleolus is noted with
approximately 4 mm distraction.

No dislocation identified.

No other fractures are identified.
IMPRESSION: MEDIAL malleolar fracture as described. Consider imaging of the
proximal fibula to exclude fracture as clinically indicated.

## 2018-01-04 IMAGING — DX DG RIBS W/ CHEST 3+V*R*
3 series · 3 of 3 positions shown · non-contrast
Comparison: [DATE] and prior chest radiographs

CLINICAL DATA: Chest and RIGHT rib pain following fall. Initial
encounter.

EXAM:
RIGHT RIBS AND CHEST - 3+ VIEW

[t chest supine]
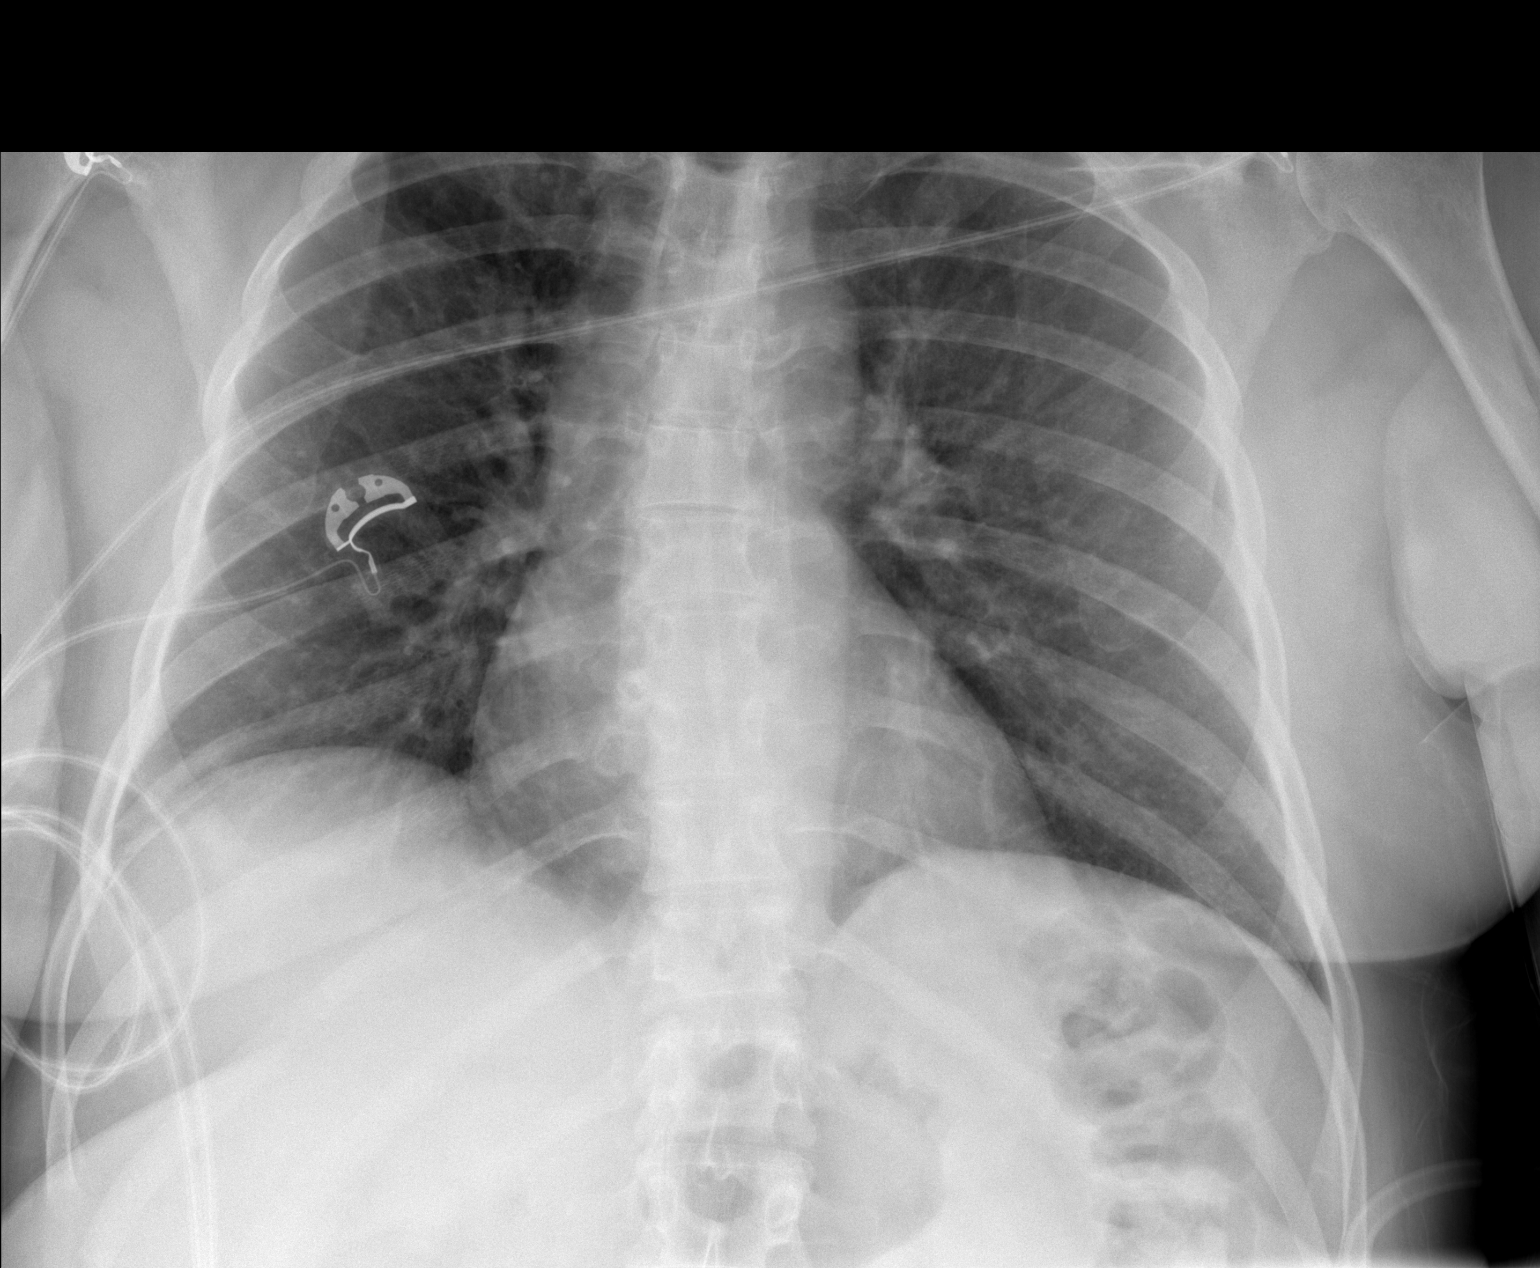

[t ribs ap upper right]
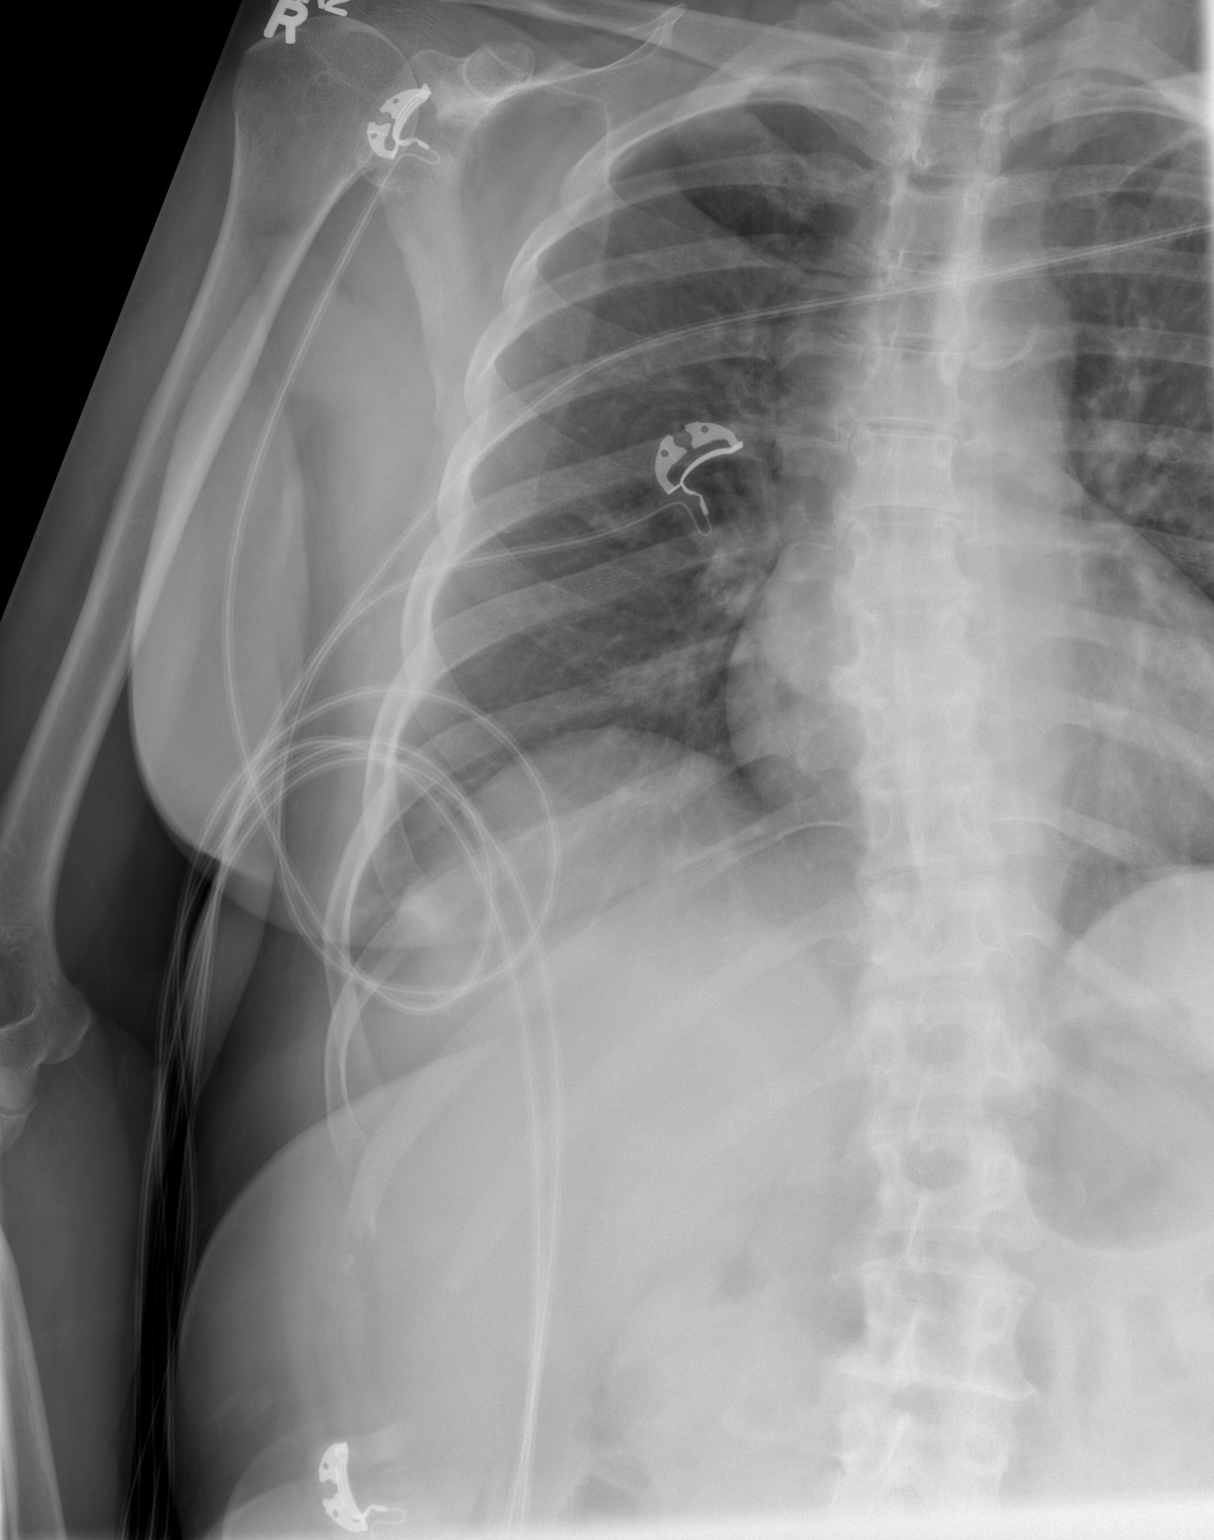

[t ribs ap lower right]
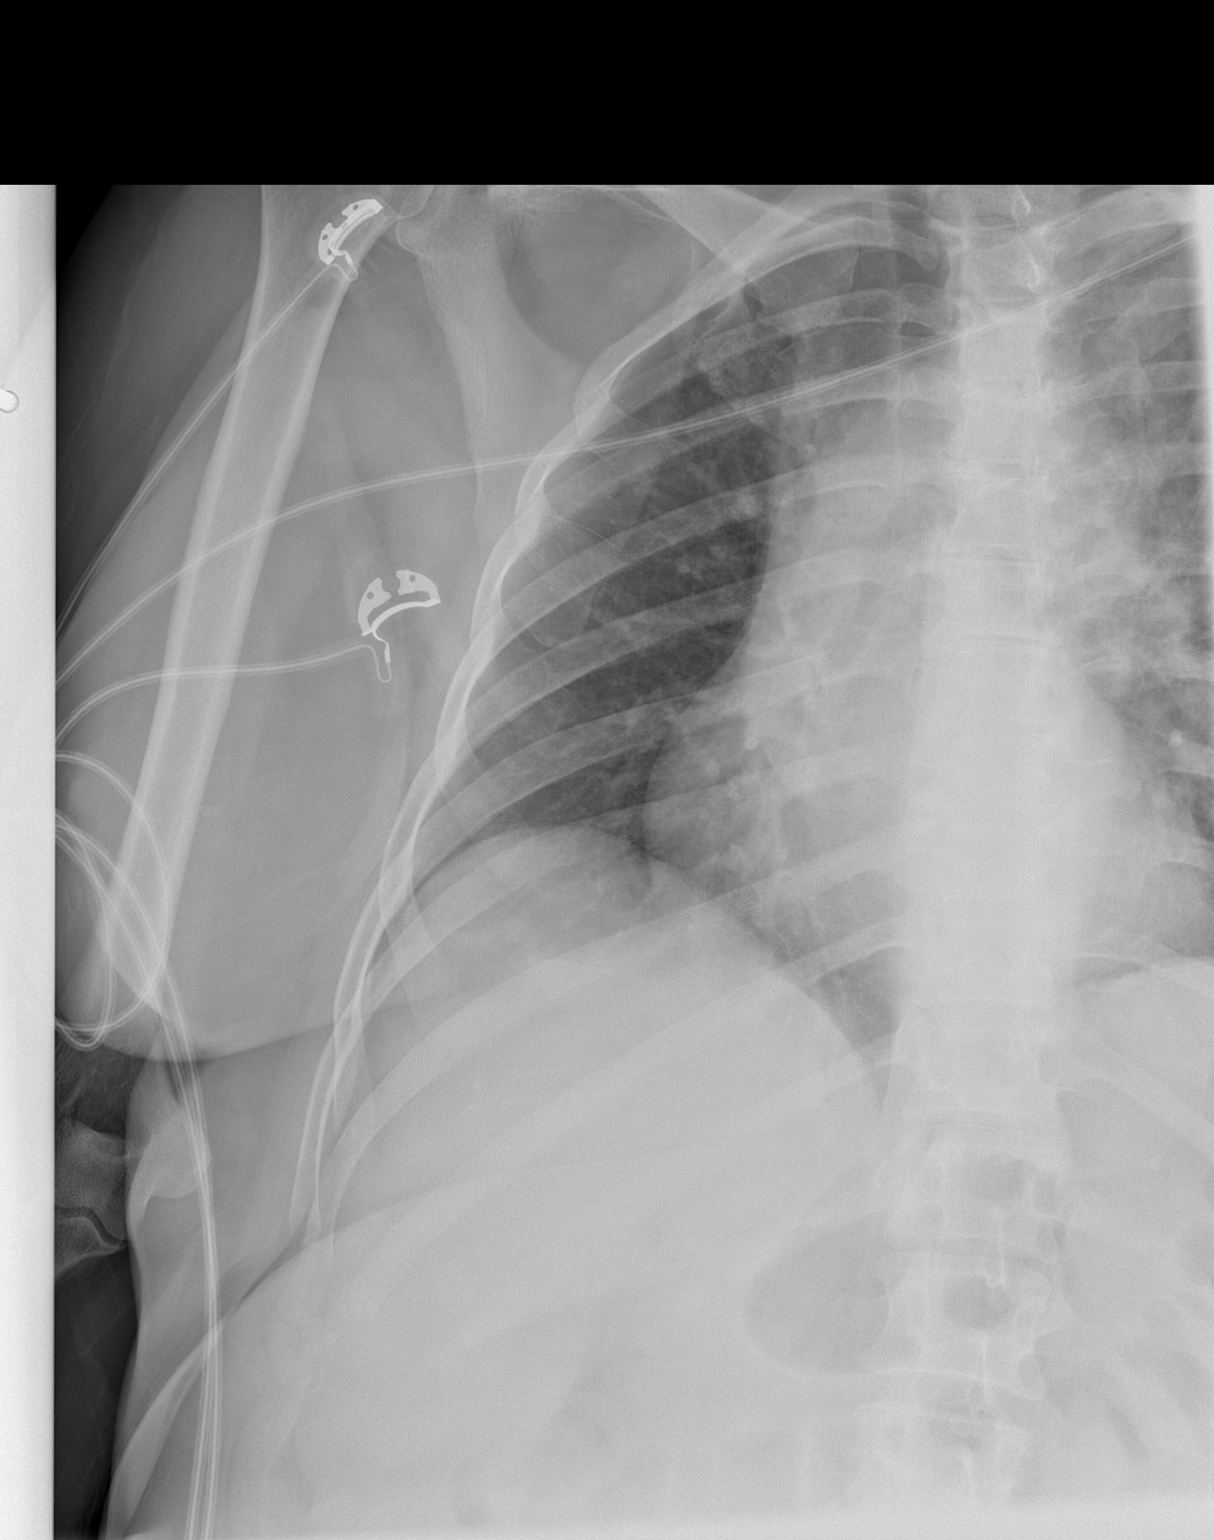

[3 of 3 positions shown; findings below may reference images not displayed]

FINDINGS: No fracture or other bone lesions are seen involving the ribs. There
is no evidence of pneumothorax or pleural effusion. Both lungs are
clear. Heart size and mediastinal contours are within normal limits.
IMPRESSION: Negative.

## 2018-01-04 IMAGING — CT CT CERVICAL SPINE W/O CM
5 of 8 series · 13 of 33 positions shown, 14 images · non-contrast
Comparison: Head CT [DATE]

CLINICAL DATA: Fell from attic about 9 feet

EXAM:
CT HEAD WITHOUT CONTRAST
CT CERVICAL SPINE WITHOUT CONTRAST
TECHNIQUE: Multidetector CT imaging of the head and cervical spine was
performed following the standard protocol without intravenous
contrast. Multiplanar CT image reconstructions of the cervical spine
were also generated.

[Series 5: head bone · axial · 0.45mm/px · z∈[-91,-35]mm · 2 of 86 slices shown]
[im 29/86  bone]
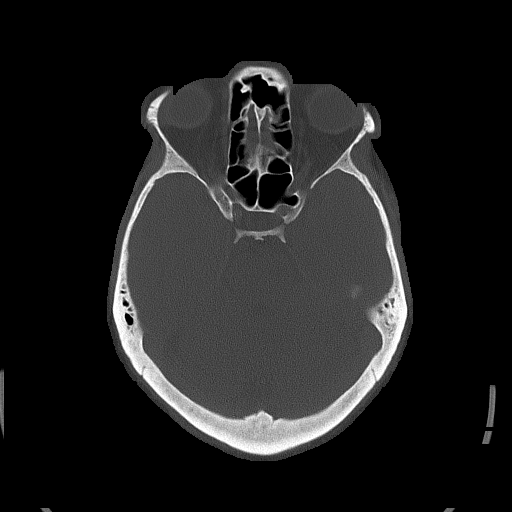
[im 57/86  bone]
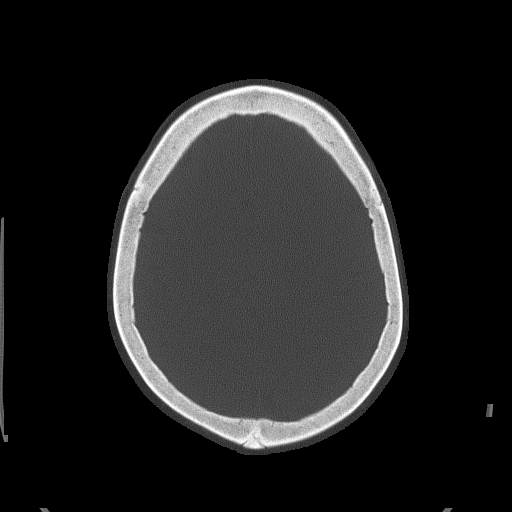

[Series 6: head without cor · coronal · non-contrast · 0.34mm/px · 3 of 77 slices shown]
[im 20/77  bone]
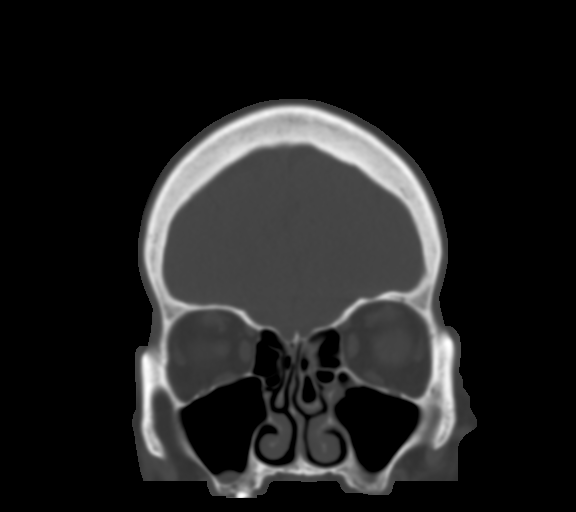
[im 39/77  bone]
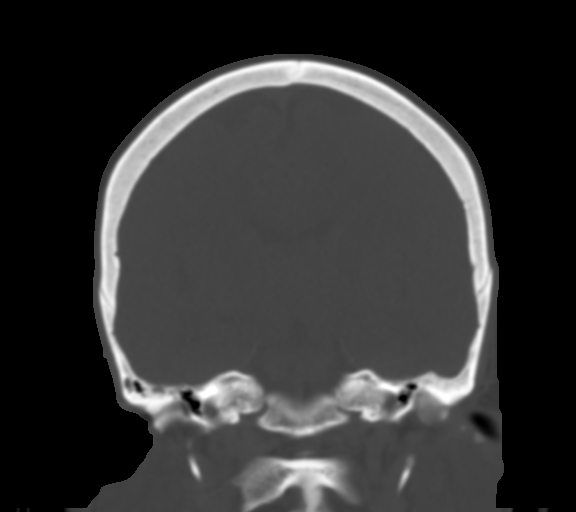
[im 58/77  bone]
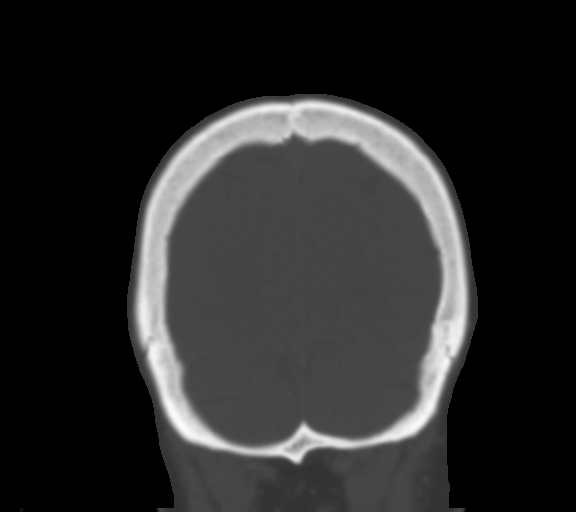

[Series 8: c_spine 2.0 st · axial · 0.37mm/px · z∈[-205,-149]mm · 2 of 86 slices shown, 3 images]
[im 29/86  soft-tissue]
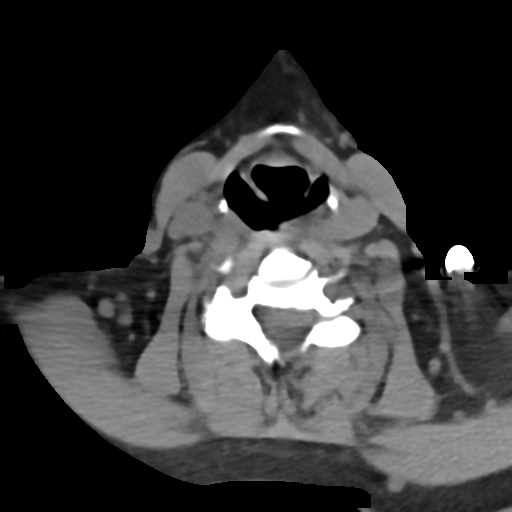
[im 29/86  bone]
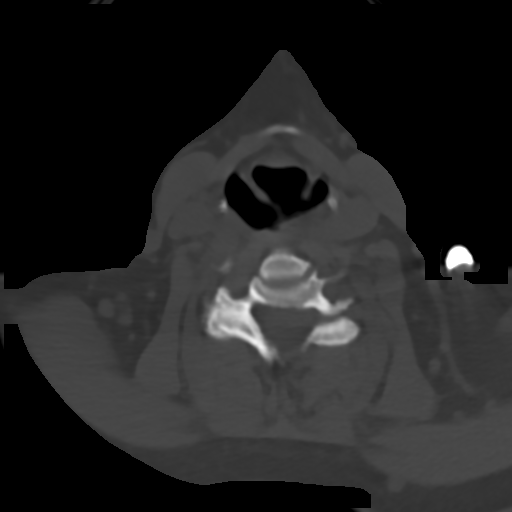
[im 57/86  bone]
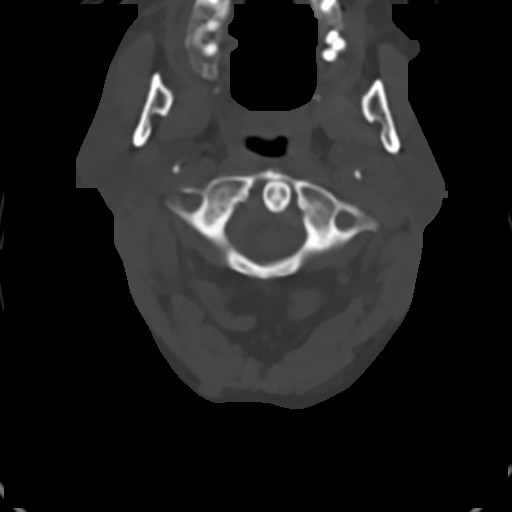

[Series 10: c_spine 2.0 sag bone · sagittal · 0.29mm/px · 4 of 61 slices shown]
[im 13/61  bone]
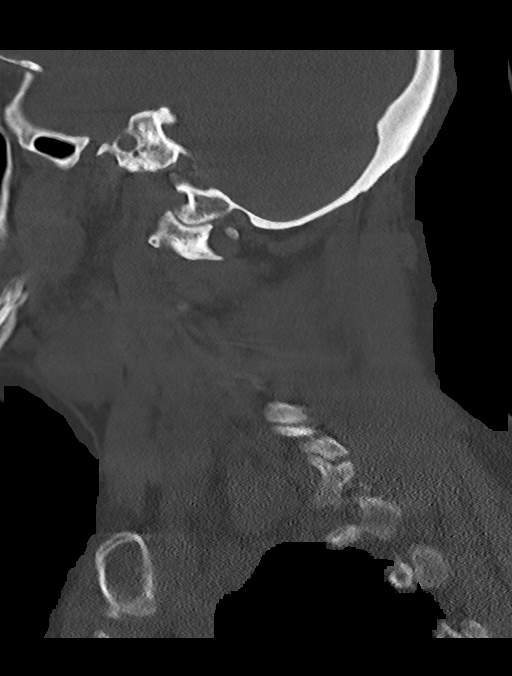
[im 25/61  bone]
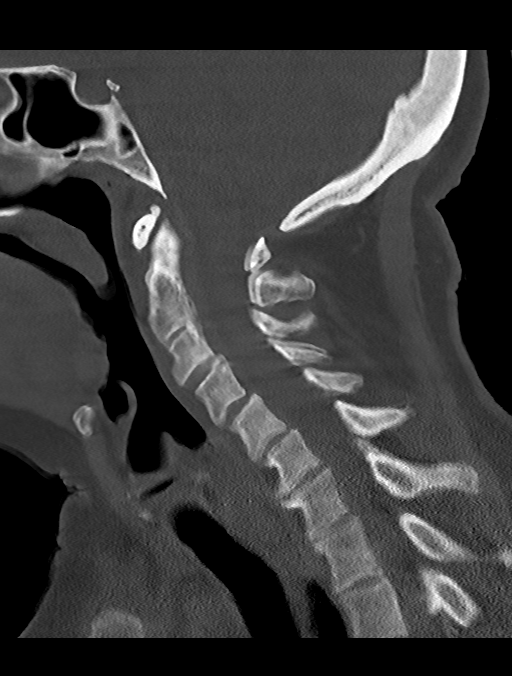
[im 37/61  bone]
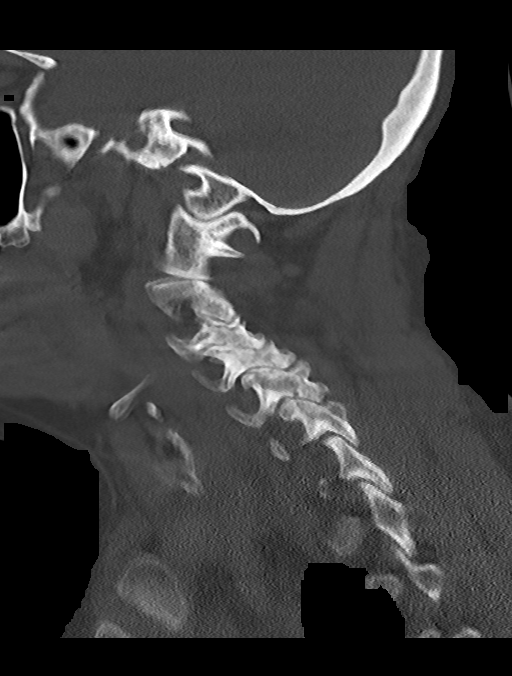
[im 49/61  bone]
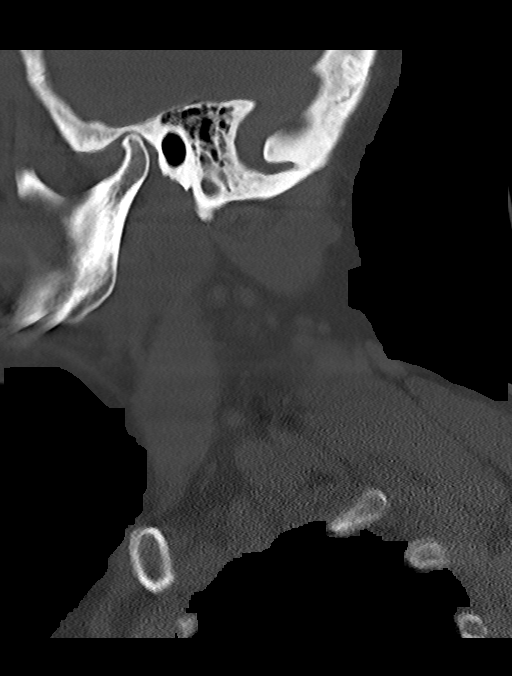

[Series 12: c_spine 2.0 orthogonals · axial · 0.21mm/px · z∈[-235,-188]mm · 2 of 84 slices shown]
[im 28/84  bone]
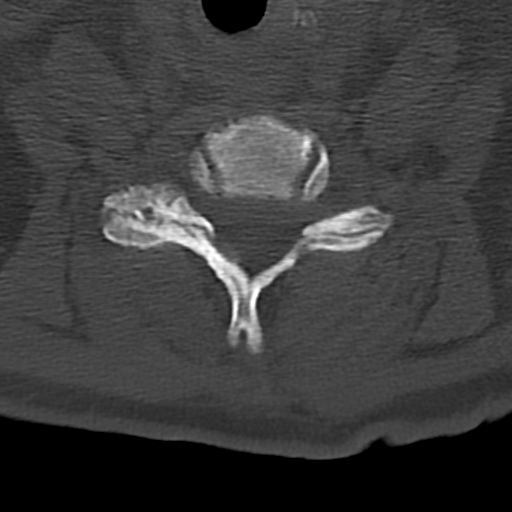
[im 56/84  bone]
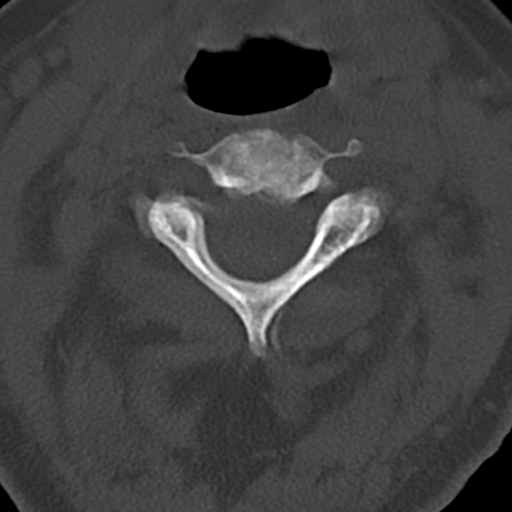

[13 of 33 positions shown; findings below may reference images not displayed]

FINDINGS: CT HEAD FINDINGS

Brain: No evidence of acute infarction, hemorrhage, hydrocephalus,
extra-axial collection or mass lesion/mass effect.

Vascular: No hyperdense vessel or unexpected calcification.

Skull: No fracture.  Left mastoid effusion.

Sinuses/Orbits: Mucosal thickening in the maxillary ethmoid and
sphenoid sinuses. No acute orbital abnormality

Other: None

CT CERVICAL SPINE FINDINGS

Alignment: Trace anterolisthesis C5 on C6. Facet alignment within
normal limits. Motion degraded study.

Skull base and vertebrae: No obvious fracture. Vertebral body
heights grossly maintained.

Soft tissues and spinal canal: No prevertebral fluid or swelling. No
visible canal hematoma.

Disc levels: Mild to moderate degenerative changes at C5-C6 and
C6-C7. Multiple level facet hypertrophy, left greater than right.

Upper chest: Negative.

Other: Lymph nodes at the base of neck with small subcentimeter
lymph nodes at the supraclavicular fossa right greater than left.
IMPRESSION: 1. Negative non contrasted CT appearance of the brain.
2. Motion degraded cervical spine CT which limits the study. Trace
anterolisthesis of C5 on C6 likely degenerative. No gross fracture
or malalignment seen
3. Multiple small cervical and supraclavicular fossa lymph nodes

## 2018-01-04 MED ORDER — TRAMADOL HCL 50 MG PO TABS: 50.0000 mg | ORAL_TABLET | Freq: Four times a day (QID) | ORAL | 0 refills | Status: DC | PRN

## 2018-01-04 MED ORDER — TRAMADOL HCL 50 MG PO TABS
50.0000 mg | ORAL_TABLET | Freq: Once | ORAL | Status: AC
  Administered 2018-01-04: 50 mg via ORAL
  Filled 2018-01-04: qty 1

## 2018-01-04 NOTE — Discharge Instructions (Addendum)
It is very important to follow-up for psychiatry treatment as soon as possible.  Use the information attached to help you find a doctor.  One of the best places to go is [facility] for immediate treatment.  You also need to see an orthopedic doctor about the right ankle fracture.  Wear the cam walker, whenever you are up to help the discomfort.  Return here, if needed, for problems.

## 2018-01-04 NOTE — ED Triage Notes (Signed)
Per [facility] pt coming from home. Reports behavioral crisis x 2 days. Pt went into attic and fell through ceiling yesterday landing on vinyl floor, approx 16ft. Pt again went into attic today and fell, landing on mattress. Pt has hx of manic depression. EMS reports house was covered in feces and filthy. Pt given  albuterol and 0.5 Atrovent en route. 20 G in right hand.

## 2018-01-04 NOTE — ED Notes (Signed)
Dr. Wentz at the bedside.  

## 2018-01-04 NOTE — ED Provider Notes (Signed)
[facility] EMERGENCY DEPARTMENT Provider Note   CSN: [id number] Arrival date & time: 01/04/18  1857     History   Chief Complaint Chief Complaint  Patient presents with  . Fall  . Behavioral Issues    HPI Michaela Wells is a 57 y.o. female.  HPI   She is here for evaluation of falling, "from my attic into my house," yesterday and today.  She states that she had gone there looking for glasses that someone put up there.  After the second fall family members called EMS, because she was complaining of chest pain.  Yesterday she hurt her right ankle but was still able to walk.  Family members are concerned that she is not taking care of herself, or taking her regular medications.  They feel that she only takes her Xanax, and drinks too much alcohol.  She has previously been treated by psychiatry but has not seen them recently.  The patient admits that she has had some thoughts that are delusions, and that she does need to see her psychiatrist.  She is willing to do that as an outpatient.  The patient denies suicidal or homicidal ideation.  Past Medical History:  Diagnosis Date  . Bronchial asthma   . Chicken pox   . COPD (chronic obstructive pulmonary disease) (HCC)   . Depression   . Frequent headaches   . GERD (gastroesophageal reflux disease)   . UTI (lower urinary tract infection)     Patient Active Problem List   Diagnosis Date Noted  . COPD (chronic obstructive pulmonary disease) (HCC) 10/09/2013  . GAD (generalized anxiety disorder) 10/09/2013  . Smoking trying to quit 10/09/2013  . GERD (gastroesophageal reflux disease) 10/09/2013    Past Surgical History:  Procedure Laterality Date  . ESOPHAGEAL DILATION    . TONSILLECTOMY       OB History   None      Home Medications    Prior to Admission medications   Medication Sig Start Date End Date Taking? Authorizing Provider  albuterol (PROVENTIL HFA;VENTOLIN HFA) 108 (90 BASE) MCG/ACT inhaler  Inhale 1 puff into the lungs every 4 (four) hours as needed for wheezing or shortness of breath. Ventolin 10/20/13  Yes Ascencion Dike, PA-C  ALPRAZolam Prudy Feeler) 0.25 MG tablet Take 1 tablet (0.25 mg total) by mouth 2 (two) times daily as needed for anxiety. 10/20/13  Yes Ascencion Dike, PA-C  Aspirin-Salicylamide-Caffeine (BC HEADACHE POWDER PO) Take 1-2 Packages by mouth daily as needed (pain).    Yes [provider]  metoprolol tartrate (LOPRESSOR) 50 MG tablet Take 50 mg by mouth 2 (two) times daily.   Yes [provider]  PARoxetine (PAXIL) 40 MG tablet Take 40 mg by mouth every morning.   Yes [provider]  traMADol (ULTRAM) 50 MG tablet Take 1 tablet (50 mg total) by mouth every 6 (six) hours as needed for moderate pain. 01/04/18   Mancel Bale, MD  Umeclidinium-Vilanterol [facility] ELLIPTA) 62.5-25 MCG/INH AEPB Inhale 1 puff into the lungs daily. 10/20/13   Ascencion Dike, PA-C    Family History Family History  Problem Relation Age of Onset  . Other Mother        MVA-Deceased [pt was age 59]  . COPD Father        Deceased  . Heart attack Maternal Grandmother   . Stroke Maternal Grandmother   . Brain cancer Maternal Grandmother   . Arthritis/Rheumatoid Maternal Grandmother   . Heart  attack Maternal Grandfather   . Stroke Maternal Grandfather   . Heart disease Maternal Aunt   . Stroke Maternal Aunt        #1  . Heart attack Maternal Aunt        #1  . Hypertension Maternal Aunt        #1  . Arthritis/Rheumatoid Sister   . COPD Sister   . Heart disease Maternal Uncle   . Heart attack Maternal Uncle        x4  . Heart disease Brother        #1  . Heart attack Brother        #1  . Diabetes Brother   . Diabetes Maternal Aunt   . Mental retardation Maternal Aunt   . Healthy Daughter        x2  . Drug abuse Daughter        #1  . Drug abuse Daughter        #2  . Kidney Stones Daughter        #2  . Other Son        Agoraphobia    Social  History Social History   Tobacco Use  . Smoking status: Current Every Day Smoker  Substance Use Topics  . Alcohol use: Not on file  . Drug use: Not on file     Allergies   Penicillins   Review of Systems Review of Systems  All other systems reviewed and are negative.    Physical Exam Updated Vital Signs BP (!) 143/100   Pulse 65   Temp 98.1 F (36.7 C) (Oral)   Resp 17   Ht  (1.575 m)   Wt 72.6 kg (160 lb)   SpO2 97%   BMI 29.26 kg/m   Physical Exam  Constitutional: She is oriented to person, place, and time. She appears well-developed. She appears distressed (She is uncomfortable).  Appears older than stated age.  Poor hygiene.  HENT:  Head: Normocephalic and atraumatic.  Eyes: Pupils are equal, round, and reactive to light. Conjunctivae and EOM are normal.  Neck: Normal range of motion and phonation normal. Neck supple.  Cardiovascular: Normal rate and regular rhythm.  Pulmonary/Chest: Effort normal and breath sounds normal. She exhibits tenderness (Mild tenderness right chest wall without crepitation or deformity.).  Abdominal: Soft. She exhibits no distension. There is no tenderness. There is no guarding.  Musculoskeletal:  Tender and swollen right ankle without deformity.  No skin break.  Right knee is nontender and has normal range of motion.  No deformity of the right tib-fib region.  Neurological: She is alert and oriented to person, place, and time. She exhibits normal muscle tone.  No dysarthria, aphasia or nystagmus.  Skin: Skin is warm and dry.  Had excoriations of her legs without evidence for infection.  Psychiatric: She has a normal mood and affect. Her behavior is normal. Judgment and thought content normal.  She does not appear to be responding to internal stimuli.  Nursing note and vitals reviewed.    ED Treatments / Results  Labs (all labs ordered are listed, but only abnormal results are displayed) Labs Reviewed  BASIC METABOLIC  PANEL - Abnormal; Notable for the following components:      Result Value   Potassium 3.3 (*)    Glucose, Bld 101 (*)    Calcium 7.9 (*)    All other components within normal limits  CBC WITH DIFFERENTIAL/PLATELET - Abnormal; Notable for the following  components:   Hemoglobin 11.8 (*)    Platelets 414 (*)    All other components within normal limits  URINALYSIS, ROUTINE W REFLEX MICROSCOPIC - Abnormal; Notable for the following components:   APPearance HAZY (*)    Leukocytes, UA TRACE (*)    Bacteria, UA RARE (*)    All other components within normal limits  RAPID URINE DRUG SCREEN, HOSP PERFORMED - Abnormal; Notable for the following components:   Benzodiazepines POSITIVE (*)    Tetrahydrocannabinol POSITIVE (*)    All other components within normal limits  ETHANOL    EKG None  Radiology Dg Ribs Unilateral W/chest Right  Result Date: 01/04/2018 CLINICAL DATA:  Chest and RIGHT rib pain following fall. Initial encounter. EXAM: RIGHT RIBS AND CHEST - 3+ VIEW COMPARISON:  11/07/2014 and prior chest radiographs FINDINGS: No fracture or other bone lesions are seen involving the ribs. There is no evidence of pneumothorax or pleural effusion. Both lungs are clear. Heart size and mediastinal contours are within normal limits. IMPRESSION: Negative. Electronically Signed   By: Harmon Pier M.D.   On: 01/04/2018 20:37   Dg Ankle Complete Right  Result Date: 01/04/2018 CLINICAL DATA:  Acute RIGHT ankle pain following fall. Initial encounter. EXAM: RIGHT ANKLE - COMPLETE 3+ VIEW COMPARISON:  None. FINDINGS: A transverse fracture of the MEDIAL malleolus is noted with approximately 4 mm distraction. No dislocation identified. No other fractures are identified. IMPRESSION: MEDIAL malleolar fracture as described. Consider imaging of the proximal fibula to exclude fracture as clinically indicated. Electronically Signed   By: Harmon Pier M.D.   On: 01/04/2018 20:39   Ct Head Wo Contrast  Result Date:  01/04/2018 CLINICAL DATA:  Larey Seat from attic about 9 feet EXAM: CT HEAD WITHOUT CONTRAST CT CERVICAL SPINE WITHOUT CONTRAST TECHNIQUE: Multidetector CT imaging of the head and cervical spine was performed following the standard protocol without intravenous contrast. Multiplanar CT image reconstructions of the cervical spine were also generated. COMPARISON:  Head CT 12/25/2005 FINDINGS: CT HEAD FINDINGS Brain: No evidence of acute infarction, hemorrhage, hydrocephalus, extra-axial collection or mass lesion/mass effect. Vascular: No hyperdense vessel or unexpected calcification. Skull: No fracture.  Left mastoid effusion. Sinuses/Orbits: Mucosal thickening in the maxillary ethmoid and sphenoid sinuses. No acute orbital abnormality Other: None CT CERVICAL SPINE FINDINGS Alignment: Trace anterolisthesis C5 on C6. Facet alignment within normal limits. Motion degraded study. Skull base and vertebrae: No obvious fracture. Vertebral body heights grossly maintained. Soft tissues and spinal canal: No prevertebral fluid or swelling. No visible canal hematoma. Disc levels: Mild to moderate degenerative changes at C5-C6 and C6-C7. Multiple level facet hypertrophy, left greater than right. Upper chest: Negative. Other: Lymph nodes at the base of neck with small subcentimeter lymph nodes at the supraclavicular fossa right greater than left. IMPRESSION: 1. Negative non contrasted CT appearance of the brain. 2. Motion degraded cervical spine CT which limits the study. Trace anterolisthesis of C5 on C6 likely degenerative. No gross fracture or malalignment seen 3. Multiple small cervical and supraclavicular fossa lymph nodes Electronically Signed   By: Jasmine Pang M.D.   On: 01/04/2018 21:36   Ct Cervical Spine Wo Contrast  Result Date: 01/04/2018 CLINICAL DATA:  Larey Seat from attic about 9 feet EXAM: CT HEAD WITHOUT CONTRAST CT CERVICAL SPINE WITHOUT CONTRAST TECHNIQUE: Multidetector CT imaging of the head and cervical spine was  performed following the standard protocol without intravenous contrast. Multiplanar CT image reconstructions of the cervical spine were also generated. COMPARISON:  Head CT 12/25/2005  FINDINGS: CT HEAD FINDINGS Brain: No evidence of acute infarction, hemorrhage, hydrocephalus, extra-axial collection or mass lesion/mass effect. Vascular: No hyperdense vessel or unexpected calcification. Skull: No fracture.  Left mastoid effusion. Sinuses/Orbits: Mucosal thickening in the maxillary ethmoid and sphenoid sinuses. No acute orbital abnormality Other: None CT CERVICAL SPINE FINDINGS Alignment: Trace anterolisthesis C5 on C6. Facet alignment within normal limits. Motion degraded study. Skull base and vertebrae: No obvious fracture. Vertebral body heights grossly maintained. Soft tissues and spinal canal: No prevertebral fluid or swelling. No visible canal hematoma. Disc levels: Mild to moderate degenerative changes at C5-C6 and C6-C7. Multiple level facet hypertrophy, left greater than right. Upper chest: Negative. Other: Lymph nodes at the base of neck with small subcentimeter lymph nodes at the supraclavicular fossa right greater than left. IMPRESSION: 1. Negative non contrasted CT appearance of the brain. 2. Motion degraded cervical spine CT which limits the study. Trace anterolisthesis of C5 on C6 likely degenerative. No gross fracture or malalignment seen 3. Multiple small cervical and supraclavicular fossa lymph nodes Electronically Signed   By: Jasmine Pang M.D.   On: 01/04/2018 21:36    Procedures Procedures (including critical care time)  Medications Ordered in ED Medications  traMADol (ULTRAM) tablet 50 mg (has no administration in time range)     Initial Impression / Assessment and Plan / ED Course  I have reviewed the triage vital signs and the nursing notes.  Pertinent labs & imaging results that were available during my care of the patient were reviewed by me and considered in my medical  decision making (see chart for details).  Clinical Course as of Jan 04 2309  Wynelle Link Jan 04, 2018  2235 Abnormal, presence of benzodiazepines and THC  Urine rapid drug screen (hosp performed)(!) [EW]  2235 Abnormal, hemoglobin low  CBC with Differential(!) [EW]  2235 Abnormal, hazy color and trace leukocyte esterase  Urinalysis, Routine w reflex microscopic(!) [EW]  2235 Normal  Ethanol [EW]  2235 Normal except potassium low 3.3, glucose high 101, calcium low 7.9  Basic metabolic panel(!) [EW]    Clinical Course User Index [EW] Mancel Bale, MD     Patient Vitals for the past 24 hrs:  BP Temp Temp src Pulse Resp SpO2 Height Weight  01/04/18 2300 (!) 143/100 - - 65 - 97 % - -  01/04/18 2215 129/79 - - 68 - 96 % - -  01/04/18 2145 127/69 - - 73 - 95 % - -  01/04/18 2100 (!) 146/82 - - - 17 - - -  01/04/18 2000 133/84 - - 69 20 99 % - -  01/04/18 1913 136/70 98.1 F (36.7 C) Oral 99 17 99 % 5\' 2"  (1.575 m) 72.6 kg (160 lb)    11:02 PM Reevaluation with update and discussion. After initial assessment and treatment, an updated evaluation reveals patient is comfortable, insightful and cooperative.  Family members are here and findings were discussed with her.  All questions were answered. Mancel Bale   Medical Decision Making: Fall associated with psychiatric delusion, with out significant unstable psychiatric illness.  Injury right ankle with malleolus fracture without evidence for other injury in the right leg.  The patient is stable for outpatient management of both the orthopedic injury, and her psychiatric illness.  Family members understand the importance of helping her seek care at a psychiatry facility.  CRITICAL CARE-no Performed by: Mancel Bale   Nursing Notes Reviewed/ Care Coordinated Applicable Imaging Reviewed Interpretation of Laboratory Data incorporated into ED treatment  The patient appears reasonably screened and/or stabilized for discharge and I doubt any  other medical condition or other [facility] requiring further screening, evaluation, or treatment in the ED at this time prior to discharge.  Plan: Home Medications-continue current medications; Home Treatments-rest, fluids, CAM Walker; return here if the recommended treatment, does not improve the symptoms; Recommended follow up-orthopedic follow-up 1 week for ankle injury/fracture, psychiatry follow-up as soon as possible for further evaluation and treatment of delusions.     Final Clinical Impressions(s) / ED Diagnoses   Final diagnoses:  Fall, initial encounter  Closed fracture of right ankle, initial encounter  Contusion of rib on right side, initial encounter  Delusion (HCC)  Bipolar affective disorder, remission status unspecified [facility])    ED Discharge Orders        Ordered    traMADol (ULTRAM) 50 MG tablet  Every 6 hours PRN     01/04/18 2310       Mancel Bale, MD 01/04/18 2310

## 2018-01-04 NOTE — ED Notes (Signed)
Back from CT

## 2018-05-04 ENCOUNTER — Other Ambulatory Visit: Payer: Self-pay

## 2018-05-04 DIAGNOSIS — F419 Anxiety disorder, unspecified: Secondary | ICD-10-CM | POA: Insufficient documentation

## 2018-05-04 DIAGNOSIS — Z046 Encounter for general psychiatric examination, requested by authority: Secondary | ICD-10-CM | POA: Insufficient documentation

## 2018-05-04 DIAGNOSIS — J449 Chronic obstructive pulmonary disease, unspecified: Secondary | ICD-10-CM | POA: Insufficient documentation

## 2018-05-04 DIAGNOSIS — F333 Major depressive disorder, recurrent, severe with psychotic symptoms: Secondary | ICD-10-CM | POA: Insufficient documentation

## 2018-05-04 DIAGNOSIS — F17200 Nicotine dependence, unspecified, uncomplicated: Secondary | ICD-10-CM | POA: Insufficient documentation

## 2018-05-04 DIAGNOSIS — Z79899 Other long term (current) drug therapy: Secondary | ICD-10-CM | POA: Insufficient documentation

## 2018-05-04 NOTE — ED Triage Notes (Signed)
Pt arrives with officer, voluntarily from home. Reports they picked her up "wandering" outside her home. She called them because she said she "hears my daughter in the next room saying she wants to kill me, she is right next to me and I hear her. I called my son and told him, and he don't believe me. I just want to make sure I am not mental or something". Officer states the daughter does not live with her- she lives in [place], and neither does the son. Denies SI/HI

## 2018-05-05 ENCOUNTER — Emergency Department (HOSPITAL_COMMUNITY)
Admission: EM | Admit: 2018-05-05 | Discharge: 2018-05-05 | Disposition: A | Discharge status: Other Acute Inpatient Hospital | Payer: Self-pay | Attending: Emergency Medicine | Admitting: Emergency Medicine

## 2018-05-05 ENCOUNTER — Encounter (HOSPITAL_COMMUNITY): Payer: Self-pay | Admitting: *Deleted

## 2018-05-05 ENCOUNTER — Other Ambulatory Visit: Payer: Self-pay

## 2018-05-05 ENCOUNTER — Inpatient Hospital Stay (HOSPITAL_COMMUNITY)
Admission: AD | Admit: 2018-05-05 | Discharge: 2018-05-11 | DRG: 885 | Disposition: A | Discharge status: Home / Self Care | Payer: Federal, State, Local not specified - Other | Source: Intra-hospital | Attending: Psychiatry | Admitting: Psychiatry

## 2018-05-05 DIAGNOSIS — G4700 Insomnia, unspecified: Secondary | ICD-10-CM | POA: Diagnosis present

## 2018-05-05 DIAGNOSIS — Z915 Personal history of self-harm: Secondary | ICD-10-CM

## 2018-05-05 DIAGNOSIS — Z7982 Long term (current) use of aspirin: Secondary | ICD-10-CM | POA: Diagnosis not present

## 2018-05-05 DIAGNOSIS — Z818 Family history of other mental and behavioral disorders: Secondary | ICD-10-CM

## 2018-05-05 DIAGNOSIS — K219 Gastro-esophageal reflux disease without esophagitis: Secondary | ICD-10-CM | POA: Diagnosis present

## 2018-05-05 DIAGNOSIS — Z79899 Other long term (current) drug therapy: Secondary | ICD-10-CM | POA: Diagnosis not present

## 2018-05-05 DIAGNOSIS — F17210 Nicotine dependence, cigarettes, uncomplicated: Secondary | ICD-10-CM | POA: Diagnosis present

## 2018-05-05 DIAGNOSIS — J449 Chronic obstructive pulmonary disease, unspecified: Secondary | ICD-10-CM | POA: Diagnosis present

## 2018-05-05 DIAGNOSIS — Z825 Family history of asthma and other chronic lower respiratory diseases: Secondary | ICD-10-CM | POA: Diagnosis not present

## 2018-05-05 DIAGNOSIS — F333 Major depressive disorder, recurrent, severe with psychotic symptoms: Secondary | ICD-10-CM

## 2018-05-05 DIAGNOSIS — Z62810 Personal history of physical and sexual abuse in childhood: Secondary | ICD-10-CM | POA: Diagnosis present

## 2018-05-05 DIAGNOSIS — Z813 Family history of other psychoactive substance abuse and dependence: Secondary | ICD-10-CM | POA: Diagnosis not present

## 2018-05-05 DIAGNOSIS — Z23 Encounter for immunization: Secondary | ICD-10-CM

## 2018-05-05 DIAGNOSIS — F315 Bipolar disorder, current episode depressed, severe, with psychotic features: Principal | ICD-10-CM | POA: Diagnosis present

## 2018-05-05 DIAGNOSIS — F22 Delusional disorders: Secondary | ICD-10-CM | POA: Diagnosis present

## 2018-05-05 DIAGNOSIS — F411 Generalized anxiety disorder: Secondary | ICD-10-CM | POA: Diagnosis present

## 2018-05-05 DIAGNOSIS — F4310 Post-traumatic stress disorder, unspecified: Secondary | ICD-10-CM

## 2018-05-05 LAB — COMPREHENSIVE METABOLIC PANEL
ALBUMIN: 4.5 g/dL (ref 3.5–5.0)
ALT: 18 U/L (ref 0–44)
AST: 35 U/L (ref 15–41)
Alkaline Phosphatase: 81 U/L (ref 38–126)
Anion gap: 17 — ABNORMAL HIGH (ref 5–15)
BUN: 17 mg/dL (ref 6–20)
CHLORIDE: 107 mmol/L (ref 98–111)
CO2: 20 mmol/L — ABNORMAL LOW (ref 22–32)
Calcium: 9.2 mg/dL (ref 8.9–10.3)
Creatinine, Ser: 1.04 mg/dL — ABNORMAL HIGH (ref 0.44–1.00)
GFR calc Af Amer: >60 mL/min (ref >60)
GFR calc non Af Amer: 58 mL/min — ABNORMAL LOW (ref >60)
GLUCOSE: 116 mg/dL — AB (ref 70–99)
POTASSIUM: 3.6 mmol/L (ref 3.5–5.1)
SODIUM: 144 mmol/L (ref 135–145)
Total Bilirubin: 0.4 mg/dL (ref 0.3–1.2)
Total Protein: 7.8 g/dL (ref 6.5–8.1)

## 2018-05-05 LAB — CBC
HEMATOCRIT: 40.8 % (ref 36.0–46.0)
Hemoglobin: 13.7 g/dL (ref 12.0–15.0)
MCH: 29 pg (ref 26.0–34.0)
MCHC: 33.6 g/dL (ref 30.0–36.0)
MCV: 86.3 fL (ref 78.0–100.0)
PLATELETS: 398 10*3/uL (ref 150–400)
RBC: 4.73 MIL/uL (ref 3.87–5.11)
RDW: 14.4 % (ref 11.5–15.5)
WBC: 8.3 10*3/uL (ref 4.0–10.5)

## 2018-05-05 LAB — RAPID URINE DRUG SCREEN, HOSP PERFORMED
AMPHETAMINES: NOT DETECTED
BENZODIAZEPINES: POSITIVE — AB
Barbiturates: NOT DETECTED
Cocaine: NOT DETECTED
Opiates: NOT DETECTED

## 2018-05-05 LAB — ETHANOL

## 2018-05-05 LAB — ACETAMINOPHEN LEVEL: Acetaminophen (Tylenol), Serum: <10 ug/mL — ABNORMAL LOW (ref 10–30)

## 2018-05-05 LAB — SALICYLATE LEVEL: Salicylate Lvl: 28.3 mg/dL (ref 2.8–30.0)

## 2018-05-05 MED ORDER — HYDROXYZINE HCL 25 MG PO TABS: 25.0000 mg | ORAL_TABLET | Freq: Three times a day (TID) | ORAL | Status: DC | PRN

## 2018-05-05 MED ORDER — PNEUMOCOCCAL VAC POLYVALENT 25 MCG/0.5ML IJ INJ
0.5000 mL | INJECTION | INTRAMUSCULAR | Status: AC
  Administered 2018-05-06: 0.5 mL via INTRAMUSCULAR

## 2018-05-05 MED ORDER — ACETAMINOPHEN 325 MG PO TABS
650.0000 mg | ORAL_TABLET | Freq: Four times a day (QID) | ORAL | Status: DC | PRN
  Administered 2018-05-06 – 2018-05-11 (×9): 650 mg via ORAL
  Filled 2018-05-05 (×7): qty 2

## 2018-05-05 MED ORDER — OLANZAPINE 5 MG PO TBDP
5.0000 mg | ORAL_TABLET | Freq: Every day | ORAL | Status: DC
Start: 2018-05-05 — End: 2018-05-05
  Administered 2018-05-05: 5 mg via ORAL
  Filled 2018-05-05: qty 1

## 2018-05-05 MED ORDER — ACETAMINOPHEN 325 MG PO TABS: 650.0000 mg | ORAL_TABLET | ORAL | Status: DC | PRN

## 2018-05-05 MED ORDER — MAGNESIUM HYDROXIDE 400 MG/5ML PO SUSP: 30.0000 mL | Freq: Every day | ORAL | Status: DC | PRN

## 2018-05-05 MED ORDER — UMECLIDINIUM-VILANTEROL 62.5-25 MCG/INH IN AEPB: 1.0000 | INHALATION_SPRAY | Freq: Every day | RESPIRATORY_TRACT | Status: DC

## 2018-05-05 MED ORDER — NICOTINE 21 MG/24HR TD PT24
21.0000 mg | MEDICATED_PATCH | Freq: Every day | TRANSDERMAL | Status: DC
  Administered 2018-05-05 – 2018-05-11 (×7): 21 mg via TRANSDERMAL
  Filled 2018-05-05 (×8): qty 1
  Filled 2018-05-05: qty 14
  Filled 2018-05-05: qty 1

## 2018-05-05 MED ORDER — ONDANSETRON HCL 4 MG PO TABS: 4.0000 mg | ORAL_TABLET | Freq: Three times a day (TID) | ORAL | Status: DC | PRN

## 2018-05-05 MED ORDER — CYCLOBENZAPRINE HCL 10 MG PO TABS
10.0000 mg | ORAL_TABLET | Freq: Every day | ORAL | Status: DC | PRN
  Administered 2018-05-08 – 2018-05-10 (×3): 10 mg via ORAL
  Filled 2018-05-05 (×3): qty 2

## 2018-05-05 MED ORDER — UMECLIDINIUM-VILANTEROL 62.5-25 MCG/INH IN AEPB
1.0000 | INHALATION_SPRAY | Freq: Every day | RESPIRATORY_TRACT | Status: DC
  Administered 2018-05-05: 1 via RESPIRATORY_TRACT
  Filled 2018-05-05: qty 14

## 2018-05-05 MED ORDER — ALUM & MAG HYDROXIDE-SIMETH 200-200-20 MG/5ML PO SUSP: 30.0000 mL | ORAL | Status: DC | PRN

## 2018-05-05 MED ORDER — ALBUTEROL SULFATE HFA 108 (90 BASE) MCG/ACT IN AERS: 1.0000 | INHALATION_SPRAY | RESPIRATORY_TRACT | Status: DC | PRN

## 2018-05-05 MED ORDER — INFLUENZA VAC SPLIT QUAD 0.5 ML IM SUSY
0.5000 mL | PREFILLED_SYRINGE | INTRAMUSCULAR | Status: AC
  Administered 2018-05-06: 0.5 mL via INTRAMUSCULAR
  Filled 2018-05-05: qty 0.5

## 2018-05-05 MED ORDER — ZOLPIDEM TARTRATE 5 MG PO TABS: 5.0000 mg | ORAL_TABLET | Freq: Every evening | ORAL | Status: DC | PRN

## 2018-05-05 MED ORDER — UMECLIDINIUM-VILANTEROL 62.5-25 MCG/INH IN AEPB
1.0000 | INHALATION_SPRAY | Freq: Every day | RESPIRATORY_TRACT | Status: DC
  Administered 2018-05-08 – 2018-05-11 (×4): 1 via RESPIRATORY_TRACT

## 2018-05-05 MED ORDER — ALPRAZOLAM 1 MG PO TABS
1.0000 mg | ORAL_TABLET | Freq: Three times a day (TID) | ORAL | Status: DC | PRN
  Administered 2018-05-05: 1 mg via ORAL
  Filled 2018-05-05: qty 1

## 2018-05-05 NOTE — ED Notes (Signed)
Bed: [facility]4 Expected date:  Expected time:  Means of arrival:  Comments: 

## 2018-05-05 NOTE — ED Notes (Signed)
Pt was resting when she jumped up from her sleep. Pt started pacing. Asked Pt what was wrong, Pt stated that she was hearing voiced. Asked Pt what voices were saying and she did not say. Tried to make Pt more comfortable but giving warm blankets and drink. Pt is tearful. Pt lying in stretcher and denies any needs at this time.
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
## 2018-05-05 NOTE — ED Notes (Signed)
Bed: WBH35 Expected date:  Expected time:  Means of arrival:  Comments: Hold for hall c 

## 2018-05-05 NOTE — ED Notes (Signed)
Called report to [employer] at [facility] and [employer] for transport to [facility]
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
## 2018-05-05 NOTE — Progress Notes (Signed)
Did not attend group 

## 2018-05-05 NOTE — ED Notes (Signed)
Transported to [facility] by [employer]. All belongings returned to pt. Pt calm and cooperative. Report called to [employer] on [employer] at [facility].

## 2018-05-05 NOTE — ED Provider Notes (Signed)
11:41 AM The patient is medically clear at this time. No metabolic derangements. MSE complete. Will benefit from psychiatric inpatient evaluation   Azalia Bilis, MD 05/05/18 1142

## 2018-05-05 NOTE — ED Notes (Signed)
Pt wanded by security and escorted back to the Acute Area.  Report given to Diane, RN.

## 2018-05-05 NOTE — BH Assessment (Signed)
[facility] Assessment Progress Note  Per Juanetta Beets, DO, this pt requires psychiatric hospitalization at this time.  Malva Limes, RN, [facility] has assigned pt to [facility] Rm [id number]; [facility] will be ready to receive pt at 14:00.  Pt has signed Voluntary Admission and Consent for Treatment, as well as Consent to Release Information to her sister and to her daughter Michaela Wells, and signed forms have been faxed to [facility].  Pt's nurse, Diane, has been notified, and agrees to send original paperwork along with pt via Juel Burrow, and to call report to [phone].  Doylene Canning, [place] [facility] Coordinator [phone]

## 2018-05-05 NOTE — ED Notes (Signed)
Pt pacing in her room. Aked if the Xanax helped her anxiety and she said, "Well it might have it my daughter had not stepped in here and started talking. She is talking to the doctor now."  Pt continues to be a little tearful.

## 2018-05-05 NOTE — ED Provider Notes (Signed)
[facility]-EMERGENCY DEPT Provider Note   CSN: [id number] Arrival date & time: 05/04/18  2249     History   Chief Complaint Chief Complaint  Patient presents with  . Hallucinations    HPI Michaela Wells is a 57 y.o. female.  Patient is a 57 year old female with past medical history of COPD and past psychiatric history of depression.  She presents today for evaluation of hallucinations and paranoia.  She tells me that she is here for [place] to determine "if she is crazy".  She reports hearing women's voices in her home coming from the walls.  She is convinced that her daughter is living in the house when I am told she is not.  She was brought today by police who found her wandering outside of her home.  She denies to me she is experiencing suicidal or homicidal ideation.  She denies any other physical complaints.  The history is provided by the patient.    Past Medical History:  Diagnosis Date  . Bronchial asthma   . Chicken pox   . COPD (chronic obstructive pulmonary disease) (HCC)   . Depression   . Frequent headaches   . GERD (gastroesophageal reflux disease)   . UTI (lower urinary tract infection)     Patient Active Problem List   Diagnosis Date Noted  . COPD (chronic obstructive pulmonary disease) (HCC) 10/09/2013  . GAD (generalized anxiety disorder) 10/09/2013  . Smoking trying to quit 10/09/2013  . GERD (gastroesophageal reflux disease) 10/09/2013    Past Surgical History:  Procedure Laterality Date  . ESOPHAGEAL DILATION    . TONSILLECTOMY       OB History   None      Home Medications    Prior to Admission medications   Medication Sig Start Date End Date Taking? Authorizing Provider  ALPRAZolam Prudy Feeler) 1 MG tablet Take 1 mg by mouth 3 (three) times daily as needed for anxiety.  04/21/18  Yes [provider]  Aspirin-Salicylamide-Caffeine (BC HEADACHE POWDER PO) Take 1-2 Packages by mouth daily as needed (pain).    Yes  [provider]  cyclobenzaprine (FLEXERIL) 10 MG tablet Take 10 mg by mouth daily as needed for muscle spasms.  02/24/18  Yes [provider]  zolpidem (AMBIEN) 10 MG tablet Take 10 mg by mouth at bedtime as needed for sleep.  02/24/18  Yes [provider]  albuterol (PROVENTIL HFA;VENTOLIN HFA) 108 (90 BASE) MCG/ACT inhaler Inhale 1 puff into the lungs every 4 (four) hours as needed for wheezing or shortness of breath. Ventolin Patient not taking: Reported on 05/05/2018 10/20/13   Ascencion Dike, PA-C  ALPRAZolam Prudy Feeler) 0.25 MG tablet Take 1 tablet (0.25 mg total) by mouth 2 (two) times daily as needed for anxiety. Patient not taking: Reported on 05/05/2018 10/20/13   Ascencion Dike, PA-C  traMADol (ULTRAM) 50 MG tablet Take 1 tablet (50 mg total) by mouth every 6 (six) hours as needed for moderate pain. Patient not taking: Reported on 05/05/2018 01/04/18   Mancel Bale, MD  Umeclidinium-Vilanterol [facility] ELLIPTA) 62.5-25 MCG/INH AEPB Inhale 1 puff into the lungs daily. Patient not taking: Reported on 05/05/2018 10/20/13   Ascencion Dike, PA-C    Family History Family History  Problem Relation Age of Onset  . Other Mother        MVA-Deceased [pt was age 65]  . COPD Father        Deceased  . Heart attack Maternal Grandmother   .  Stroke Maternal Grandmother   . Brain cancer Maternal Grandmother   . Arthritis/Rheumatoid Maternal Grandmother   . Heart attack Maternal Grandfather   . Stroke Maternal Grandfather   . Heart disease Maternal Aunt   . Stroke Maternal Aunt        #1  . Heart attack Maternal Aunt        #1  . Hypertension Maternal Aunt        #1  . Arthritis/Rheumatoid Sister   . COPD Sister   . Heart disease Maternal Uncle   . Heart attack Maternal Uncle        x4  . Heart disease Brother        #1  . Heart attack Brother        #1  . Diabetes Brother   . Diabetes Maternal Aunt   . Mental retardation Maternal Aunt   . Healthy Daughter         x2  . Drug abuse Daughter        #1  . Drug abuse Daughter        #2  . Kidney Stones Daughter        #2  . Other Son        Agoraphobia    Social History Social History   Tobacco Use  . Smoking status: Current Every Day Smoker  Substance Use Topics  . Alcohol use: Not on file  . Drug use: Not on file     Allergies   Penicillins   Review of Systems Review of Systems  All other systems reviewed and are negative.    Physical Exam Updated Vital Signs BP 140/87   Pulse 93   Temp 97.7 F (36.5 C) (Oral)   Resp 16   SpO2 98%   Physical Exam  Constitutional: She is oriented to person, place, and time. She appears well-developed and well-nourished. No distress.  HENT:  Head: Normocephalic and atraumatic.  Neck: Normal range of motion. Neck supple.  Cardiovascular: Normal rate and regular rhythm. Exam reveals no gallop and no friction rub.  No murmur heard. Pulmonary/Chest: Effort normal and breath sounds normal. No respiratory distress. She has no wheezes.  Abdominal: Soft. Bowel sounds are normal. She exhibits no distension. There is no tenderness.  Musculoskeletal: Normal range of motion.  Neurological: She is alert and oriented to person, place, and time.  Skin: Skin is warm and dry. She is not diaphoretic.  Psychiatric: Her mood appears anxious. Her affect is blunt and labile. Her speech is tangential. She is hyperactive. Thought content is paranoid and delusional. Cognition and memory are normal. She expresses no homicidal and no suicidal ideation.  Nursing note and vitals reviewed.    ED Treatments / Results  Labs (all labs ordered are listed, but only abnormal results are displayed) Labs Reviewed  COMPREHENSIVE METABOLIC PANEL  ETHANOL  SALICYLATE LEVEL  ACETAMINOPHEN LEVEL  CBC  RAPID URINE DRUG SCREEN, HOSP PERFORMED    EKG None  Radiology No results found.  Procedures Procedures (including critical care time)  Medications Ordered in  ED Medications - No data to display   Initial Impression / Assessment and Plan / ED Course  I have reviewed the triage vital signs and the nursing notes.  Pertinent labs & imaging results that were available during my care of the patient were reviewed by me and considered in my medical decision making (see chart for details).  Patient is medically cleared.  She will be evaluated by  TTS who will determine the final disposition.  Final Clinical Impressions(s) / ED Diagnoses   Final diagnoses:  None    ED Discharge Orders    None       Geoffery Lyons, MD 05/05/18 2314

## 2018-05-05 NOTE — ED Notes (Signed)
TTS at bedside. 

## 2018-05-05 NOTE — Tx Team (Signed)
Initial Treatment Plan 05/05/2018 4:24 PM Michaela Wells OZY:[id number]    PATIENT STRESSORS: Financial difficulties Health problems Marital or family conflict   PATIENT STRENGTHS: Ability for insight Average or above average intelligence Capable of independent living   PATIENT IDENTIFIED PROBLEMS: Depression Paranoia Auditory hallucinations Suicidal thoughts "I'm not crazy like they make me out to be"                     DISCHARGE CRITERIA:  Ability to meet basic life and health needs Improved stabilization in mood, thinking, and/or behavior Verbal commitment to aftercare and medication compliance  PRELIMINARY DISCHARGE PLAN: Attend aftercare/continuing care group Return to previous living arrangement  PATIENT/FAMILY INVOLVEMENT: This treatment plan has been presented to and reviewed with the patient, Michaela Wells, and/or family member, .  The patient and family have been given the opportunity to ask questions and make suggestions.  Khyren Hing, [place], [place] 05/05/2018, 4:24 PM

## 2018-05-06 DIAGNOSIS — F315 Bipolar disorder, current episode depressed, severe, with psychotic features: Principal | ICD-10-CM

## 2018-05-06 DIAGNOSIS — F4310 Post-traumatic stress disorder, unspecified: Secondary | ICD-10-CM

## 2018-05-06 LAB — HEMOGLOBIN A1C
HEMOGLOBIN A1C: 6.7 % — AB (ref 4.8–5.6)
MEAN PLASMA GLUCOSE: 145.59 mg/dL

## 2018-05-06 LAB — LIPID PANEL
CHOLESTEROL: 203 mg/dL — AB (ref 0–200)
HDL: 51 mg/dL (ref >40)
LDL CALC: 137 mg/dL — AB (ref 0–99)
TRIGLYCERIDES: 73 mg/dL (ref <150)
Total CHOL/HDL Ratio: 4 RATIO
VLDL: 15 mg/dL (ref 0–40)

## 2018-05-06 LAB — TSH: TSH: 1.195 u[IU]/mL (ref 0.350–4.500)

## 2018-05-06 MED ORDER — TRAZODONE HCL 50 MG PO TABS
50.0000 mg | ORAL_TABLET | Freq: Every evening | ORAL | Status: DC | PRN
  Administered 2018-05-06 – 2018-05-10 (×5): 50 mg via ORAL
  Filled 2018-05-06: qty 6
  Filled 2018-05-06: qty 1

## 2018-05-06 MED ORDER — OLANZAPINE 10 MG PO TABS
10.0000 mg | ORAL_TABLET | Freq: Every day | ORAL | Status: DC
  Administered 2018-05-06 – 2018-05-10 (×5): 10 mg via ORAL
  Filled 2018-05-06 (×5): qty 1
  Filled 2018-05-06: qty 7
  Filled 2018-05-06: qty 1

## 2018-05-06 MED ORDER — ALBUTEROL SULFATE HFA 108 (90 BASE) MCG/ACT IN AERS: 2.0000 | INHALATION_SPRAY | RESPIRATORY_TRACT | Status: DC | PRN

## 2018-05-06 MED ORDER — HYDROXYZINE HCL 50 MG PO TABS
50.0000 mg | ORAL_TABLET | Freq: Four times a day (QID) | ORAL | Status: DC | PRN
  Filled 2018-05-06: qty 10

## 2018-05-06 MED ORDER — DIVALPROEX SODIUM 250 MG PO DR TAB
750.0000 mg | DELAYED_RELEASE_TABLET | Freq: Every day | ORAL | Status: DC
  Administered 2018-05-06 – 2018-05-10 (×5): 750 mg via ORAL
  Filled 2018-05-06 (×3): qty 3
  Filled 2018-05-06: qty 21
  Filled 2018-05-06 (×3): qty 3

## 2018-05-06 MED ORDER — OLANZAPINE 5 MG PO TBDP: 5.0000 mg | ORAL_TABLET | Freq: Three times a day (TID) | ORAL | Status: DC | PRN

## 2018-05-06 MED ORDER — PANTOPRAZOLE SODIUM 40 MG PO TBEC
40.0000 mg | DELAYED_RELEASE_TABLET | Freq: Every day | ORAL | Status: DC
Start: 2018-05-06 — End: 2018-05-11
  Administered 2018-05-06 – 2018-05-11 (×6): 40 mg via ORAL
  Filled 2018-05-06 (×9): qty 1

## 2018-05-06 NOTE — Tx Team (Signed)
Interdisciplinary Treatment and Diagnostic Plan Update  05/06/2018 Time of Session: 2:57 PM  Michaela Wells MRN: [id number]  Principal Diagnosis: <principal problem not specified>  Secondary Diagnoses: Active Problems:   MDD (major depressive disorder), recurrent, severe, with psychosis ([place])   Current Medications:  Current Facility-Administered Medications  Medication Dose Route Frequency Provider Last Rate Last Dose  . acetaminophen (TYLENOL) tablet 650 mg  650 mg Oral Q6H PRN Nanci Pina, FNP      . albuterol (PROVENTIL HFA;VENTOLIN HFA) 108 (90 Base) MCG/ACT inhaler 2 puff  2 puff Inhalation Q4H PRN Pennelope Bracken, MD      . alum & mag hydroxide-simeth (MAALOX/MYLANTA) 200-200-20 MG/5ML suspension 30 mL  30 mL Oral Q4H PRN Starkes, Takia S, FNP      . cyclobenzaprine (FLEXERIL) tablet 10 mg  10 mg Oral Daily PRN Starkes, Takia S, FNP      . divalproex (DEPAKOTE) DR tablet 750 mg  750 mg Oral QHS Pennelope Bracken, MD      . hydrOXYzine (ATARAX/VISTARIL) tablet 50 mg  50 mg Oral Q6H PRN Pennelope Bracken, MD      . magnesium hydroxide (MILK OF MAGNESIA) suspension 30 mL  30 mL Oral Daily PRN Starkes, Takia S, FNP      . nicotine (NICODERM CQ - dosed in mg/24 hours) patch 21 mg  21 mg Transdermal Daily Cobos, Myer Peer, MD   21 mg at 05/06/18 0752  . OLANZapine (ZYPREXA) tablet 10 mg  10 mg Oral QHS [place], Christopher T, MD      . OLANZapine zydis (ZYPREXA) disintegrating tablet 5 mg  5 mg Oral Q8H PRN Pennelope Bracken, MD      . ondansetron (ZOFRAN) tablet 4 mg  4 mg Oral Q8H PRN Nanci Pina, FNP      . pantoprazole (PROTONIX) EC tablet 40 mg  40 mg Oral Daily Pennelope Bracken, MD      . traZODone (DESYREL) tablet 50 mg  50 mg Oral QHS PRN,MR X 1 [place], Christopher T, MD      . umeclidinium-vilanterol (ANORO ELLIPTA) 62.5-25 MCG/INH 1 puff  1 puff Inhalation Daily Starkes, Takia S, FNP        PTA Medications: Medications  Prior to Admission  Medication Sig Dispense Refill Last Dose  . albuterol (PROVENTIL HFA;VENTOLIN HFA) 108 (90 BASE) MCG/ACT inhaler Inhale 1 puff into the lungs every 4 (four) hours as needed for wheezing or shortness of breath. Ventolin (Patient not taking: Reported on 05/05/2018) 1 Inhaler 2 Not Taking at Unknown time  . ALPRAZolam (XANAX) 1 MG tablet Take 1 mg by mouth 3 (three) times daily as needed for anxiety.   5 Past Week at Unknown time  . Aspirin-Salicylamide-Caffeine (BC HEADACHE POWDER PO) Take 1-2 Packages by mouth daily as needed (pain).    unk  . cyclobenzaprine (FLEXERIL) 10 MG tablet Take 10 mg by mouth daily as needed for muscle spasms.   5 Past Month at Unknown time  . traMADol (ULTRAM) 50 MG tablet Take 1 tablet (50 mg total) by mouth every 6 (six) hours as needed for moderate pain. (Patient not taking: Reported on 05/05/2018) 15 tablet 0 Completed Course at Unknown time  . Umeclidinium-Vilanterol (ANORO ELLIPTA) 62.5-25 MCG/INH AEPB Inhale 1 puff into the lungs daily. (Patient not taking: Reported on 05/05/2018) 30 each 11 Not Taking at Unknown time  . zolpidem (AMBIEN) 10 MG tablet Take 10 mg by mouth at bedtime as needed for sleep.  5 Past Month at Unknown time    Patient Stressors: Financial difficulties Health problems Marital or family conflict  Patient Strengths: Ability for insight Average or above average intelligence Capable of independent living  Treatment Modalities: Medication Management, Group therapy, Case management,  1 to 1 session with clinician, Psychoeducation, Recreational therapy.   Physician Treatment Plan for Primary Diagnosis: <principal problem not specified> Long Term Goal(s): Improvement in symptoms so as ready for discharge  Short Term Goals:    Medication Management: Evaluate patient's response, side effects, and tolerance of medication regimen.  Therapeutic Interventions: 1 to 1 sessions, Unit Group sessions and Medication  administration.  Evaluation of Outcomes: Progressing  Physician Treatment Plan for Secondary Diagnosis: Active Problems:   MDD (major depressive disorder), recurrent, severe, with psychosis ([place])   Long Term Goal(s): Improvement in symptoms so as ready for discharge  Short Term Goals:    Medication Management: Evaluate patient's response, side effects, and tolerance of medication regimen.  Therapeutic Interventions: 1 to 1 sessions, Unit Group sessions and Medication administration.  Evaluation of Outcomes: Progressing   RN Treatment Plan for Primary Diagnosis: <principal problem not specified> Long Term Goal(s): Knowledge of disease and therapeutic regimen to maintain health will improve  Short Term Goals: Ability to identify and develop effective coping behaviors will improve and Compliance with prescribed medications will improve  Medication Management: RN will administer medications as ordered by provider, will assess and evaluate patient's response and provide education to patient for prescribed medication. RN will report any adverse and/or side effects to prescribing provider.  Therapeutic Interventions: 1 on 1 counseling sessions, Psychoeducation, Medication administration, Evaluate responses to treatment, Monitor vital signs and CBGs as ordered, Perform/monitor CIWA, COWS, AIMS and Fall Risk screenings as ordered, Perform wound care treatments as ordered.  Evaluation of Outcomes: Progressing   LCSW Treatment Plan for Primary Diagnosis: <principal problem not specified> Long Term Goal(s): Safe transition to appropriate next level of care at discharge, Engage patient in therapeutic group addressing interpersonal concerns.  Short Term Goals: Engage patient in aftercare planning with referrals and resources  Therapeutic Interventions: Assess for all discharge needs, 1 to 1 time with Social worker, Explore available resources and support systems, Assess for adequacy in  community support network, Educate family and significant other(s) on suicide prevention, Complete Psychosocial Assessment, Interpersonal group therapy.  Evaluation of Outcomes: Met  Return home, follow up outpt-[facility]   Progress in Treatment: Attending groups: Yes Participating in groups: Yes Taking medication as prescribed: Yes Toleration medication: Yes, no side effects reported at this time Family/Significant other contact  Yes Discussing patient identified problems/goals with staff: Yes Medical problems stabilized or resolved: Yes Denies suicidal/homicidal ideation: Yes Issues/concerns per patient self-inventory: None Other: N/A  New problem(s) identified: None identified at this time.   New Short Term/Long Term Goal(s): "I want to find out if I am crazy, I want to be happy and I want to find myslef."   Discharge Plan or Barriers:   Reason for Continuation of Hospitalization:  Delusions  Depression Paranoia Medication stabilization   Estimated Length of Stay: 9/13  Attendees: Patient: Michaela Wells 05/06/2018  2:57 PM  Physician: Maris Berger, MD 05/06/2018  2:57 PM  Nursing: Elesa Massed RN 05/06/2018  2:57 PM  RN Care Manager: Lars Pinks, RN 05/06/2018  2:57 PM  Social Worker: Ripley Fraise 05/06/2018  2:57 PM  Recreational Therapist: Winfield Cunas 05/06/2018  2:57 PM  Other: Norberto Sorenson 05/06/2018  2:57 PM  Other:  05/06/2018  2:57 PM  Scribe for Treatment Team:  Roque Lias LCSW 05/06/2018 2:57 PM
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
## 2018-05-06 NOTE — Progress Notes (Signed)
Recreation Therapy Notes  Date: 9.11.19 Time: 1000 Location: [address] Dayroom  Group Topic: Music Therapy  Goal Area(s) Addresses:  Patient will identify the benefits of listening to relaxing music. Patient will identify music that can help you relax.  Behavioral Response: Engaged  Intervention:  Music  Activity:  Music Therapy.  LRT played some soft music the patients.  Patients had the opportunity to request a soothing song that they wanted played for the group.  Education: Communication, Discharge Planning  Education Outcome: Acknowledges understanding/In group clarification offered/Needs additional education.   Clinical Observations/Feedback: Pt was quiet, attentive and working on a puzzle.  Pt left early with PA and did not return.   Caroll Rancher, LRT/CTRS     Caroll Rancher A 05/06/2018 12:11 PM

## 2018-05-06 NOTE — [facility] Suicide Risk Assessment (Signed)
[facility] INPATIENT:  Family/Significant Other Suicide Prevention Education  Suicide Prevention Education:  Education Completed; No one has been identified by the patient as the family member/significant other with whom the patient will be residing, and identified as the person(s) who will aid the patient in the event of a mental health crisis (suicidal ideations/suicide attempt).  With written consent from the patient, the family member/significant other has been provided the following suicide prevention education, prior to the and/or following the discharge of the patient.  The suicide prevention education provided includes the following:  Suicide risk factors  Suicide prevention and interventions  National Suicide Hotline telephone number  [facility] assessment telephone number  [facility] Emergency Assistance 911  [facility] and/or Residential Mobile Crisis Unit telephone number  Request made of family/significant other to:  Remove weapons (e.g., guns, rifles, knives), all items previously/currently identified as safety concern.    Remove drugs/medications (over-the-counter, prescriptions, illicit drugs), all items previously/currently identified as a safety concern.  The family member/significant other verbalizes understanding of the suicide prevention education information provided.  The family member/significant other agrees to remove the items of safety concern listed above. The patient did not endorse SI at the time of admission, nor did the patient c/o SI during the stay here.  SPE not required. However, I talked to daughter, Michaela Wells, [phone].Plan for mother is to move to a home in [place] close to sister, and youngest daughter will be staying with patient to help support her.  Went over crises plan and treatment team recommendations. Michaela Wells 05/06/2018, 2:35 PM
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
## 2018-05-06 NOTE — Progress Notes (Signed)
Nursing Progress Note: 7p-7a D: Pt currently presents with a anxious/depressed/tangential/paranoid affect and behavior. Pt states "I am doing well." Interacting appropriately with the milieu. Pt reports good sleep during the previous night with current medication regimen. Pt did attend wrap-up group.  A: Pt provided with medications per providers orders. Pt's labs and vitals were monitored throughout the night. Pt supported emotionally and encouraged to express concerns and questions. Pt educated on medications.  R: Pt's safety ensured with 15 minute and environmental checks. Pt currently denies SI, HI, and VH and endoreses AH. Pt verbally contracts to seek staff if SI,HI, or AVH occurs and to consult with staff before acting on any harmful thoughts. Will continue to monitor.

## 2018-05-06 NOTE — [facility] Suicide Risk Assessment (Signed)
[facility] Admission Suicide Risk Assessment   Nursing information obtained from:  Patient Demographic factors:  Divorced or widowed, Caucasian, Low socioeconomic status, Unemployed Current Mental Status:  NA Loss Factors:  Decline in physical health Historical Factors:  Prior suicide attempts, Family history of mental illness or substance abuse Risk Reduction Factors:  Positive coping skills or problem solving skills  Total Time spent with patient: 30 minutes Principal Problem: Bipolar I disorder, current or most recent episode depressed, with psychotic features (HCC) Diagnosis:   Patient Active Problem List   Diagnosis Date Noted  . PTSD (post-traumatic stress disorder) [F43.10] 05/06/2018  . Bipolar I disorder, current or most recent episode depressed, with psychotic features (HCC) [F31.5] 05/05/2018  . COPD (chronic obstructive pulmonary disease) (HCC) [J44.9] 10/09/2013  . GAD (generalized anxiety disorder) [F41.1] 10/09/2013  . Smoking trying to quit [Z72.0] 10/09/2013  . GERD (gastroesophageal reflux disease) [K21.9] 10/09/2013   Subjective Data: See H&P  Continued Clinical Symptoms:  Alcohol Use Disorder Identification Test Final Score (AUDIT): 4 The "Alcohol Use Disorders Identification Test", Guidelines for Use in Primary Care, Second Edition.  World Science writer [facility]). Score between 0-7:  no or low risk or alcohol related problems. Score between 8-15:  moderate risk of alcohol related problems. Score between 16-19:  high risk of alcohol related problems. Score 20 or above:  warrants further diagnostic evaluation for alcohol dependence and treatment.  Psychiatric Specialty Exam: Physical Exam  Nursing note and vitals reviewed.     Blood pressure (!) 132/94, pulse 75, temperature 98.5 F (36.9 C), temperature source Oral, resp. rate 18, height 5\' 2"  (1.575 m), weight 65.3 kg.Body mass index is 26.34 kg/m.    COGNITIVE FEATURES THAT CONTRIBUTE TO RISK:  None     SUICIDE RISK:   Mild:  Suicidal ideation of limited frequency, intensity, duration, and specificity.  There are no identifiable plans, no associated intent, mild dysphoria and related symptoms, good self-control (both objective and subjective assessment), few other risk factors, and identifiable protective factors, including available and accessible social support.  PLAN OF CARE: See H&P  I certify that inpatient services furnished can reasonably be expected to improve the patient's condition.   Micheal Likens, MD 05/06/2018, 4:40 PM

## 2018-05-06 NOTE — [facility] Group Notes (Signed)
LCSW Group Therapy Note  05/06/2018 1:15pm  Type of Therapy/Topic:  Group Therapy:  Emotion Regulation  Participation Level:  Active   Description of Group:   The purpose of this group is to assist patients in learning to regulate negative emotions and experience positive emotions. Patients will be guided to discuss ways in which they have been vulnerable to their negative emotions. These vulnerabilities will be juxtaposed with experiences of positive emotions or situations, and patients will be challenged to use positive emotions to combat negative ones. Special emphasis will be placed on coping with negative emotions in conflict situations, and patients will process healthy conflict resolution skills.  Therapeutic Goals: 1. Patient will identify two positive emotions or experiences to reflect on in order to balance out negative emotions 2. Patient will label two or more emotions that they find the most difficult to experience 3. Patient will demonstrate positive conflict resolution skills through discussion and/or role plays  Summary of Patient Progress:  Michaela Wells was appropriate and attended most of the session.She contributed to the discussion. Left to attend her follow up with the doctor, before the activity began.       Therapeutic Modalities:   Cognitive Behavioral Therapy Feelings Identification Dialectical Behavioral Therapy   Ida Rogue, LCSW 05/06/2018 1:44 PM
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
## 2018-05-07 NOTE — Progress Notes (Addendum)
Nursing Progress Note: 7p-7a D: Pt currently presents with a anxious/pleasant affect and behavior. Pt states "I am really feeling better.. More human even since I was able to sleep." Interacting appropriately with the milieu. Pt reports good sleep during the previous night with current medication regimen. Pt did attend wrap-up group.  A: Pt provided with medications per providers orders. Pt's labs and vitals were monitored throughout the night. Pt supported emotionally and encouraged to express concerns and questions. Pt educated on medications.  R: Pt's safety ensured with 15 minute and environmental checks. Pt currently denies SI, HI, and VH and endorses AH. Pt verbally contracts to seek staff if SI,HI, or AVH occurs and to consult with staff before acting on any harmful thoughts. Will continue to monitor.

## 2018-05-07 NOTE — Progress Notes (Signed)
The patient shared in grouip that she had a good night's rest and that she felt more like a "human".

## 2018-05-07 NOTE — B[facility] Group Notes (Signed)
LCSW Group Therapy Note  05/07/2018 1:15pm  Type of Therapy/Topic:  Group Therapy:  Balance in Life  Participation Level:  Active  Description of Group:    This group will address the concept of balance and how it feels and looks when one is unbalanced. Patients will be encouraged to process areas in their lives that are out of balance and identify reasons for remaining unbalanced. Facilitators will guide patients in utilizing problem-solving interventions to address and correct the stressor making their life unbalanced. Understanding and applying boundaries will be explored and addressed for obtaining and maintaining a balanced life. Patients will be encouraged to explore ways to assertively make their unbalanced needs known to significant others in their lives, using other group members and facilitator for support and feedback.  Therapeutic Goals: 1. Patient will identify two or more emotions or situations they have that consume much of in their lives. 2. Patient will identify signs/triggers that life has become out of balance:  3. Patient will identify two ways to set boundaries in order to achieve balance in their lives:  4. Patient will demonstrate ability to communicate their needs through discussion and/or role plays  Summary of Patient Progress:  Stayed the entire session. Engaged with the discussion. Michaela Wells described imbalance as sadness, feeling in harmony right now.    Therapeutic Modalities:   Cognitive Behavioral Therapy Solution-Focused Therapy Assertiveness Training  Ida RogueRodney B Shelli [place], [place] 05/07/2018 1:44 PM

## 2018-05-07 NOTE — Progress Notes (Signed)
Recreation Therapy Notes  Date: 9.12.19 Time: 0955 Location: [address] Dayroom  Group Topic: Self-Esteem  Goal Area(s) Addresses:  Patient will successfully identify positive attributes about themselves.  Patient will successfully identify benefit of improved self-esteem.   Behavioral Response: Engaged  Intervention: Worksheet, colored pencils, markers  Activity: Customized Plates.  Patients were given a blank outline of a license plate.  Patients were to design the plate with things that describe them such as birth dates, favorite foods, important dates, accomplishments, things that make them unique or things they are proud of.  Education:  Self-Esteem, Building control surveyorDischarge Planning.   Education Outcome: Acknowledges education/In group clarification offered/Needs additional education  Clinical Observations/Feedback: Pt arrived late but was very engaged in the activity.  Pt showed on her plate that she was "becoming free of all the bad stuff".  Pt also had a four leaf clover for good luck, has 8 grand kids and stated her son was on Youtube by the name of [employer] Delirious.    Caroll RancherMarjette Shelisha Wells, LRT/CTRS     Caroll RancherLindsay, Mckinna Demars A 05/07/2018 11:32 AM

## 2018-05-07 NOTE — Plan of Care (Signed)
  Problem: Education: Goal: Emotional status will improve Outcome: Progressing   Problem: Activity: Goal: Interest or engagement in activities will improve Outcome: Progressing   Problem: Safety: Goal: Periods of time without injury will increase Outcome: Progressing  DAR NOTE: Patient presents with anxious affect and mood.  Denies suicidal thoughts,pain, auditory and visual hallucinations.  Described energy level as high and concentration as good.  Rates depression at 0, hopelessness at 5, and anxiety at 0.  Maintained on routine safety checks.  Medications given as prescribed.  Support and encouragement offered as needed.  Attended group and participated.  States goal for today is "my self and not wanting a cigarette."  Patient observed socializing with peers in the dayroom.  Offered no complaint.
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
## 2018-05-08 NOTE — Progress Notes (Signed)
[facility]  05/08/2018 11:16 AM Jenne PaneRhonda L Spindle  MRN:  [id number] Subjective:    Quentin AngstRhonda Morken is a 57 y/o F with history oftreatment for depressionwho [facility] anxiety, depression, paranoia, and possible psychotic symptoms of AH/VH.She was deemed medically stable in the ED and was recommended for inpatient psychiatric care. She was started on trial of zyprexa and depakote. She has been reporting incremental improvement of her presenting symptoms.  Today upon evaluation, pt shares, "I've been doing some thinking about bipolar and I read up on the symptoms, and I see a lot of that in myself now. I'm doing better though. I'm not thinking about death.." Pt denies any specific concerns. She is sleeping well. Her appetite is good. She denies physical complaints. She reports her mood is doing well. She denies anxiety. She denies paranoia. She denies SI/HI/AH/VH. She is tolerating her medications well, and she feels that they have been helpful. She is in agreement to continue her current regimen without changes. She had no further questions, comments, or concerns.  Principal Problem: Bipolar I disorder, current or most recent episode depressed, with psychotic features [facility](HCC) Diagnosis:   Patient Active Problem List   Diagnosis Date Noted  . PTSD (post-traumatic stress disorder) [F43.10] 05/06/2018  . Bipolar I disorder, current or most recent episode depressed, with psychotic features (HCC) [F31.5] 05/05/2018  . COPD (chronic obstructive pulmonary disease) (HCC) [J44.9] 10/09/2013  . GAD (generalized anxiety disorder) [F41.1] 10/09/2013  . Smoking trying to quit [Z72.0] 10/09/2013  . GERD (gastroesophageal reflux disease) [K21.9] 10/09/2013   Total Time spent with patient: 30 minutes  Past Psychiatric History: see H&P  Past Medical History:  Past Medical History:  Diagnosis Date  . Bronchial asthma   . Chicken pox   . COPD (chronic obstructive  pulmonary disease) (HCC)   . Depression   . Frequent headaches   . GERD (gastroesophageal reflux disease)   . UTI (lower urinary tract infection)     Past Surgical History:  Procedure Laterality Date  . ESOPHAGEAL DILATION    . TONSILLECTOMY     Family History:  Family History  Problem Relation Age of Onset  . Other Mother        MVA-Deceased [pt was age 67]  . COPD Father        Deceased  . Heart attack Maternal Grandmother   . Stroke Maternal Grandmother   . Brain cancer Maternal Grandmother   . Arthritis/Rheumatoid Maternal Grandmother   . Heart attack Maternal Grandfather   . Stroke Maternal Grandfather   . Heart disease Maternal Aunt   . Stroke Maternal Aunt        #1  . Heart attack Maternal Aunt        #1  . Hypertension Maternal Aunt        #1  . Arthritis/Rheumatoid Sister   . COPD Sister   . Heart disease Maternal Uncle   . Heart attack Maternal Uncle        x4  . Heart disease Brother        #1  . Heart attack Brother        #1  . Diabetes Brother   . Diabetes Maternal Aunt   . Mental retardation Maternal Aunt   . Healthy Daughter        x2  . Drug abuse Daughter        #1  . Drug abuse Daughter        #  2  . Kidney Stones Daughter        #2  . Other Son        Agoraphobia   Family Psychiatric  History: see H&P Social History:  Social History   Substance and Sexual Activity  Alcohol Use Yes     Social History   Substance and Sexual Activity  Drug Use Not Currently    Social History   Socioeconomic History  . Marital status: Single    Spouse name: Not on file  . Number of children: Not on file  . Years of education: Not on file  . Highest education level: Not on file  Occupational History  . Not on file  Social Needs  . Financial resource strain: Not on file  . Food insecurity:    Worry: Not on file    Inability: Not on file  . Transportation needs:    Medical: Not on file    Non-medical: Not on file  Tobacco Use  . Smoking  status: Current Every Day Smoker  . Smokeless tobacco: Never Used  Substance and Sexual Activity  . Alcohol use: Yes  . Drug use: Not Currently  . Sexual activity: Not Currently  Lifestyle  . Physical activity:    Days per week: Not on file    Minutes per session: Not on file  . Stress: Not on file  Relationships  . Social connections:    Talks on phone: Not on file    Gets together: Not on file    Attends religious service: Not on file    Active member of club or organization: Not on file    Attends meetings of clubs or organizations: Not on file    Relationship status: Not on file  Other Topics Concern  . Not on file  Social History Narrative  . Not on file   Additional Social History:                         Sleep: Good  Appetite:  Good  Current Medications: Current Facility-Administered Medications  Medication Dose Route Frequency Provider Last Rate Last Dose  . acetaminophen (TYLENOL) tablet 650 mg  650 mg Oral Q6H PRN Truman Hayward, FNP   650 mg at 05/08/18 0820  . albuterol (PROVENTIL HFA;VENTOLIN HFA) 108 (90 Base) MCG/ACT inhaler 2 puff  2 puff Inhalation Q4H PRN Micheal Likens, MD      . alum & mag hydroxide-simeth (MAALOX/MYLANTA) 200-200-20 MG/5ML suspension 30 mL  30 mL Oral Q4H PRN Starkes, Takia S, FNP      . cyclobenzaprine (FLEXERIL) tablet 10 mg  10 mg Oral Daily PRN Darcella Gasman, Takia S, FNP      . divalproex (DEPAKOTE) DR tablet 750 mg  750 mg Oral QHS Micheal Likens, MD   750 mg at 05/07/18 2134  . hydrOXYzine (ATARAX/VISTARIL) tablet 50 mg  50 mg Oral Q6H PRN Micheal Likens, MD      . magnesium hydroxide (MILK OF MAGNESIA) suspension 30 mL  30 mL Oral Daily PRN Starkes, Takia S, FNP      . nicotine (NICODERM CQ - dosed in mg/24 hours) patch 21 mg  21 mg Transdermal Daily Cobos, Rockey Situ, MD   21 mg at 05/08/18 0803  . OLANZapine (ZYPREXA) tablet 10 mg  10 mg Oral QHS Micheal Likens, MD   10 mg at  05/07/18 2134  . OLANZapine zydis (ZYPREXA) disintegrating tablet 5 mg  5 mg Oral Q8H PRN Micheal Likens, MD      . ondansetron [facility]) tablet 4 mg  4 mg Oral Q8H PRN Truman Hayward, FNP      . pantoprazole (PROTONIX) EC tablet 40 mg  40 mg Oral Daily Micheal Likens, MD   40 mg at 05/08/18 0801  . traZODone (DESYREL) tablet 50 mg  50 mg Oral QHS PRN,MR X 1 Micheal Likens, MD   50 mg at 05/07/18 2134  . umeclidinium-vilanterol (ANORO ELLIPTA) 62.5-25 MCG/INH 1 puff  1 puff Inhalation Daily Truman Hayward, FNP   1 puff at 05/08/18 0802    Lab Results: No results found for this or any previous visit (from the past 48 hour(s)).  Blood Alcohol level:  Lab Results  Component Value Date   ETH <10 05/05/2018   ETH <10 01/04/2018    Metabolic Disorder Labs: Lab Results  Component Value Date   HGBA1C 6.7 (H) 05/06/2018   MPG 145.59 05/06/2018   No results found for: PROLACTIN Lab Results  Component Value Date   CHOL 203 (H) 05/06/2018   TRIG 73 05/06/2018   HDL 51 05/06/2018   CHOLHDL 4.0 05/06/2018   VLDL 15 05/06/2018   LDLCALC 137 (H) 05/06/2018    Physical Findings: AIMS: Facial and Oral Movements Muscles of Facial Expression: None, normal Lips and Perioral Area: None, normal Jaw: None, normal Tongue: None, normal,Extremity Movements Upper (arms, wrists, hands, fingers): None, normal Lower (legs, knees, ankles, toes): None, normal, Trunk Movements Neck, shoulders, hips: None, normal, Overall Severity Severity of abnormal movements (highest score from questions above): None, normal Incapacitation due to abnormal movements: None, normal Patient's awareness of abnormal movements (rate only patient's report): No Awareness, Dental Status Current problems with teeth and/or dentures?: No Does patient usually wear dentures?: No  CIWA:    COWS:     Musculoskeletal: Strength & Muscle Tone: within normal limits Gait & Station: normal Patient  leans: N/A  Psychiatric Specialty Exam: Physical Exam  Nursing note and vitals reviewed.   Review of Systems  Constitutional: Negative for chills and fever.  Respiratory: Negative for cough and shortness of breath.   Cardiovascular: Negative for chest pain.  Gastrointestinal: Negative for abdominal pain, heartburn, nausea and vomiting.  Psychiatric/Behavioral: Negative for depression, hallucinations and suicidal ideas. The patient is not nervous/anxious and does not have insomnia.     Blood pressure (!) 131/94, pulse 97, temperature 98.1 F (36.7 C), temperature source Oral, resp. rate 18, height 5\' 2"  (1.575 m), weight 65.3 kg.Body mass index is 26.34 kg/m.  General Appearance: Casual and Fairly Groomed  Eye Contact:  Good  Speech:  Clear and Coherent  Volume:  Normal  Mood:  Anxious  Affect:  Appropriate and Congruent  Thought Process:  Coherent and Goal Directed  Orientation:  Full (Time, Place, and Person)  Thought Content:  Logical  Suicidal Thoughts:  No  Homicidal Thoughts:  No  Memory:  Immediate;   Fair Recent;   Fair Remote;   Fair  Judgement:  Fair  Insight:  Lacking  Psychomotor Activity:  Normal  Concentration:  Concentration: Good  Recall:  [employer] of Knowledge:  Fair  Language:  Fair  Akathisia:  No  Handed:    AIMS (if indicated):     Assets:  Resilience Social Support  ADL's:  Intact  Cognition:  WNL  Sleep:  Number of Hours: 6   Treatment Plan Summary: Daily contact with patient to assess and  evaluate symptoms and progress in treatment and Medication management    -Continue inpatient hospitalization  -Bipolar I current episode depressed with psychotic features and PTSD             -Continue zyprexa 10mg  po qhs             -Continue depakote DR 750mg  po qhs    -Depakote level on 9/15 AM  -chronic muscle spasms             -Continue flexeril 10mg  po qDay prn muscle spasm  -anxiety                        -Continue vistaril 50mg  po q6h  prn anxiety  -agitation                      -Continue zydis 5mg  po q8h prn agitation  -insomnia             -Continue trazodone 50mg  po qhs prn insomnia (may repeat x1)  -Asthma              -Continue Anoro Ellipta 1 puff daily  -GERD             -Continue protonix 40mg  po qDay  -Encourage participation in groups and therapeutic milieu  -disposition planning will be ongoing  Micheal Likens, MD 05/08/2018, 11:16 AM

## 2018-05-08 NOTE — Progress Notes (Signed)
Nursing Progress Note: 7p-7a D: Pt currently presents with a anxious/pleasant affect and behavior. Pt states "I am still hearing voices." Interacting appropriately with the milieu. Pt reports good sleep during the previous night with current medication regimen. Pt did attend wrap-up group.  A: Pt provided with medications per providers orders. Pt's labs and vitals were monitored throughout the night. Pt supported emotionally and encouraged to express concerns and questions. Pt educated on medications.  R: Pt's safety ensured with 15 minute and environmental checks. Pt currently denies SI, HI, and VH and endorses AH. Pt verbally contracts to seek staff if SI,HI, or AVH occurs and to consult with staff before acting on any harmful thoughts. Will continue to monitor.

## 2018-05-08 NOTE — Progress Notes (Signed)
Adult Psychoeducational Group Note  Date:  05/08/2018 Time:  10:09 PM  Group Topic/Focus:  Wrap-Up Group:   The focus of this group is to help patients review their daily goal of treatment and discuss progress on daily workbooks.  Participation Level:  Active  Participation Quality:  Appropriate  Affect:  Appropriate  Cognitive:  Appropriate  Insight: Appropriate  Engagement in Group:  Engaged  Modes of Intervention:  Discussion  Additional Comments:  Patient said her day was a 9. Her goal for today was to learn more about herself.   Kingslee Dowse W Magie Ciampa 05/08/2018, 10:09 PM

## 2018-05-08 NOTE — Plan of Care (Signed)
  Problem: Education: Goal: Emotional status will improve Outcome: Progressing   Problem: Activity: Goal: Interest or engagement in activities will improve Outcome: Progressing   Problem: Safety: Goal: Periods of time without injury will increase Outcome: Progressing  DAR NOTE: Patient presents with anxious affect and depressed mood.  Denies pain, auditory and visual hallucinations.  Rates depression at 3, hopelessness at 3, and anxiety at 3.  Maintained on routine safety checks.  Medications given as prescribed.  Support and encouragement offered as needed.  Attended group and participated.  States goal for today is "myself".  Patient observed socializing with peers in the dayroom.  Offered no complaint.

## 2018-05-08 NOTE — [facility] Group Notes (Signed)
[facility] LCSW Group Therapy  05/08/2018  1:05 PM  Type of Therapy:  Group therapy  Participation Level:  Active  Participation Quality:  Attentive  Affect:  Flat  Cognitive:  Oriented  Insight:  Limited  Engagement in Therapy:  Limited  Modes of Intervention:  Discussion, Socialization  Summary of Progress/Problems:  Chaplain was here to lead a group on themes of hope and courage.  Ida Rogueorth, Jalesia Loudenslager B 05/08/2018 2:33 PM

## 2018-05-09 DIAGNOSIS — Z813 Family history of other psychoactive substance abuse and dependence: Secondary | ICD-10-CM

## 2018-05-09 DIAGNOSIS — G4700 Insomnia, unspecified: Secondary | ICD-10-CM

## 2018-05-09 DIAGNOSIS — K219 Gastro-esophageal reflux disease without esophagitis: Secondary | ICD-10-CM

## 2018-05-09 DIAGNOSIS — J45909 Unspecified asthma, uncomplicated: Secondary | ICD-10-CM

## 2018-05-09 DIAGNOSIS — Z818 Family history of other mental and behavioral disorders: Secondary | ICD-10-CM

## 2018-05-09 DIAGNOSIS — F17210 Nicotine dependence, cigarettes, uncomplicated: Secondary | ICD-10-CM
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
## 2018-05-09 NOTE — Progress Notes (Signed)
DAR NOTE: Patient presents with calm affect and pleasant mood. Pt has been in the dayroom with peers. Pt stated she has a good day with her peers, feeling better than the first day she got here. Pt has compliant with unit rules and activities. Reports good sleep, good appetite, normal energy, and good concentration. Complained of muscle pain,  Denied auditory and visual hallucinations.  Rates depression at 3, hopelessness at 3, and anxiety at 5.  Maintained on routine safety checks.  Medications given as prescribed.  Support and encouragement offered as needed.  Attended group and participated.  States goal for today is " work on telling people how I feel."  Patient observed socializing with peers in the dayroom.  Offered no complaint.
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
## 2018-05-09 NOTE — [facility] Group Notes (Signed)
Adult Psychoeducational Group Note  Date:  05/09/2018 Time:  10:39 AM  Group Topic/Focus:  Building Self Esteem:   The Focus of this group is helping patients become aware of the effects of self-esteem on their lives, the things they and others do that enhance or undermine their self-esteem, seeing the relationship between their level of self-esteem and the choices they make and learning ways to enhance self-esteem.  Participation Level:  Active  Participation Quality:  Appropriate  Affect:  Appropriate  Cognitive:  Appropriate  Insight: Appropriate and Good  Engagement in Group:  Engaged and Supportive  Modes of Intervention:  Activity, Discussion and Socialization  Additional Comments:    Michaela Wells, Michaela Wells 05/09/2018, 10:39 AM

## 2018-05-09 NOTE — [facility] Group Notes (Signed)
[facility] Group Notes:  (Nursing/MHT/Case Management/Adjunct)  Date:  05/09/2018  Time:  3:24 PM Type of Therapy:  Psychoeducational Skills  Participation Level:  Active  Participation Quality:  Appropriate  Affect:  Appropriate  Cognitive:  Appropriate  Insight:  Appropriate  Engagement in Group:  Engaged  Modes of Intervention:  Problem-solving  Summary of Progress/Problems:  Topic was on anger management.  Bethann PunchesJane O Hermen Mario 05/09/2018, 3:24 PM

## 2018-05-09 NOTE — Progress Notes (Signed)
Adult Psychoeducational Group Note  Date:  05/09/2018 Time:  8:54 PM  Group Topic/Focus:  Wrap-Up Group:   The focus of this group is to help patients review their daily goal of treatment and discuss progress on daily workbooks.  Participation Level:  Active  Participation Quality:  Appropriate  Affect:  Appropriate  Cognitive:  Appropriate  Insight: Appropriate  Engagement in Group:  Engaged  Modes of Intervention:  Discussion  Additional Comments: The patient expressed that he attended group.The patient also said that he rates today a 10.  Octavio Mannshigpen, Ayriel Texidor Lee 05/09/2018, 8:54 PM

## 2018-05-09 NOTE — Progress Notes (Signed)
Nursing Progress Note: 7p-7a D: Pt currently presents with a anxious/pleasant affect and behavior. Pt states "I love trazodone. It helps me sleep so well. I need it when I get out." Interacting appropriately with the milieu. Pt reports good sleep during the previous night with current medication regimen. Pt did attend wrap-up group.  A: Pt provided with medications per providers orders. Pt's labs and vitals were monitored throughout the night. Pt supported emotionally and encouraged to express concerns and questions. Pt educated on medications.  R: Pt's safety ensured with 15 minute and environmental checks. Pt currently denies SI, HI, and VH and endorses AH. Pt verbally contracts to seek staff if SI,HI, or AVH occurs and to consult with staff before acting on any harmful thoughts. Will continue to monitor.

## 2018-05-10 LAB — VALPROIC ACID LEVEL: Valproic Acid Lvl: 86 ug/mL (ref 50.0–100.0)

## 2018-05-10 NOTE — Progress Notes (Signed)
Adult Psychoeducational Group Note  Date:  05/10/2018 Time:  8:59 PM  Group Topic/Focus:  Wrap-Up Group:   The focus of this group is to help patients review their daily goal of treatment and discuss progress on daily workbooks.  Participation Level:  Active  Participation Quality:  Appropriate  Affect:  Appropriate  Cognitive:  Appropriate  Insight: Appropriate  Engagement in Group:  Engaged  Modes of Intervention:  Discussion  Additional Comments: The patient expressed that she rates today a 10.The patient also said that she she attended groups.  Octavio Mannshigpen, Alyssa Rotondo Lee 05/10/2018, 8:59 PM

## 2018-05-10 NOTE — [facility] Group Notes (Signed)
[facility]  Date/Time:  05/10/2018  11:00AM-12:00PM  Type of Therapy and Topic:  Group Therapy:  Music and Mood  Participation Level:  Active   Description of Group: In this process group, members listened to a variety of genres of music and identified that different types of music evoke different responses.  Patients were encouraged to identify music that was soothing for them and music that was energizing for them.  Patients discussed how this knowledge can help with wellness and recovery in various ways including managing depression and anxiety as well as encouraging healthy sleep habits.    Therapeutic Goals: 1. Patients will explore the impact of different varieties of music on mood 2. Patients will verbalize the thoughts they have when listening to different types of music 3. Patients will identify music that is soothing to them as well as music that is energizing to them 4. Patients will discuss how to use this knowledge to assist in maintaining wellness and recovery 5. Patients will explore the use of music as a coping skill  Summary of Patient Progress:  At the beginning of group, patient expressed that she felt great, and at the end of group said she felt even better.  She danced, and sang, and smiled quite a bit.  Therapeutic Modalities: Solution Focused Brief Therapy Activity   Ambrose MantleMareida Grossman-Orr, LCSW

## 2018-05-10 NOTE — Progress Notes (Signed)
Michaela Wells: Michaela Wells reports, "It is going better today than yesterday. I slept well last night. I will just rate my depression at #1 today because there is no way that I can be completely cured of depression in a week. I'm doing great on the medicines".  Michaela Wells is a 57 y/o F with history oftreatment for depressionwho was [facility] voluntarilywith anxiety, depression, paranoia, and possible psychotic symptoms of AH/VH.She was deemed medically stable in the ED and was recommended for inpatient psychiatric care. She was started on trial of zyprexa and depakote. She has been reporting incremental improvement of her presenting symptoms.  Today, Michaela Wells is seen, chart reviewed. The chart findings discussed with the treatment team. She presents alert & oriented. She is visible on the unit, attending group sessions. She is doing well on her medications & her symptoms are responding well to her treatment regimen. Pt denies any specific concerns. She is sleeping well. Her appetite is good. She denies physical complaints. She reports her mood is great. She denies anxiety. She denies paranoia. She denies SI/HI/AH/VH. She is tolerating her medications well, and she feels that they have been helpful. She is in agreement to continue her current regimen without changes. She had no further questions, comments, or concerns.  Principal Problem: Bipolar I disorder, current or most recent episode depressed, with psychotic features [facility]) Diagnosis:   Michaela Active Problem List   Diagnosis Date Noted  . PTSD (post-traumatic stress disorder) [F43.10] 05/06/2018  . Bipolar I disorder, current or most recent episode depressed, with psychotic features (HCC) [F31.5] 05/05/2018  . COPD (chronic obstructive pulmonary disease) (HCC) [J44.9] 10/09/2013  . GAD (generalized anxiety disorder) [F41.1] 10/09/2013  . Smoking trying to quit  [Z72.0] 10/09/2013  . GERD (gastroesophageal reflux disease) [K21.9] 10/09/2013   Total Time spent with Michaela: 15 minutes  Past Psychiatric History: See H&P  Past Medical History:  Past Medical History:  Diagnosis Date  . Bronchial asthma   . Chicken pox   . COPD (chronic obstructive pulmonary disease) (HCC)   . Depression   . Frequent headaches   . GERD (gastroesophageal reflux disease)   . UTI (lower urinary tract infection)     Past Surgical History:  Procedure Laterality Date  . ESOPHAGEAL DILATION    . TONSILLECTOMY     Family History:  Family History  Problem Relation Age of Onset  . Other Mother        MVA-Deceased [pt was age 43]  . COPD Father        Deceased  . Heart attack Maternal Grandmother   . Stroke Maternal Grandmother   . Brain cancer Maternal Grandmother   . Arthritis/Rheumatoid Maternal Grandmother   . Heart attack Maternal Grandfather   . Stroke Maternal Grandfather   . Heart disease Maternal Aunt   . Stroke Maternal Aunt        #1  . Heart attack Maternal Aunt        #1  . Hypertension Maternal Aunt        #1  . Arthritis/Rheumatoid Sister   . COPD Sister   . Heart disease Maternal Uncle   . Heart attack Maternal Uncle        x4  . Heart disease Brother        #1  . Heart attack Brother        #1  . Diabetes Brother   .  Diabetes Maternal Aunt   . Mental retardation Maternal Aunt   . Healthy Daughter        x2  . Drug abuse Daughter        #1  . Drug abuse Daughter        #2  . Kidney Stones Daughter        #2  . Other Son        Agoraphobia   Family Psychiatric  History: See H&P  Social History:  Social History   Substance and Sexual Activity  Alcohol Use Yes     Social History   Substance and Sexual Activity  Drug Use Not Currently    Social History   Socioeconomic History  . Marital status: Single    Spouse name: Not on file  . Number of children: Not on file  . Years of education: Not on file  . Highest  education level: Not on file  Occupational History  . Not on file  Social Needs  . Financial resource strain: Not on file  . Food insecurity:    Worry: Not on file    Inability: Not on file  . Transportation needs:    Medical: Not on file    Non-medical: Not on file  Tobacco Use  . Smoking status: Current Every Day Smoker  . Smokeless tobacco: Never Used  Substance and Sexual Activity  . Alcohol use: Yes  . Drug use: Not Currently  . Sexual activity: Not Currently  Lifestyle  . Physical activity:    Days per week: Not on file    Minutes per session: Not on file  . Stress: Not on file  Relationships  . Social connections:    Talks on phone: Not on file    Gets together: Not on file    Attends religious service: Not on file    Active member of club or organization: Not on file    Attends meetings of clubs or organizations: Not on file    Relationship status: Not on file  Other Topics Concern  . Not on file  Social History Narrative  . Not on file   Additional Social History:   Sleep: Good  Appetite:  Good  Current Medications: Current Facility-Administered Medications  Medication Dose Route Frequency Provider Last Rate Last Dose  . acetaminophen (TYLENOL) tablet 650 mg  650 mg Oral Q6H PRN Truman Hayward, FNP   650 mg at 05/10/18 1717  . albuterol (PROVENTIL HFA;VENTOLIN HFA) 108 (90 Base) MCG/ACT inhaler 2 puff  2 puff Inhalation Q4H PRN Micheal Likens, MD      . alum & mag hydroxide-simeth (MAALOX/MYLANTA) 200-200-20 MG/5ML suspension 30 mL  30 mL Oral Q4H PRN Starkes, Takia S, FNP      . cyclobenzaprine (FLEXERIL) tablet 10 mg  10 mg Oral Daily PRN Truman Hayward, FNP   10 mg at 05/10/18 1716  . divalproex (DEPAKOTE) DR tablet 750 mg  750 mg Oral QHS Jolyne Loa T, MD   750 mg at 05/09/18 2126  . hydrOXYzine (ATARAX/VISTARIL) tablet 50 mg  50 mg Oral Q6H PRN Micheal Likens, MD      . magnesium hydroxide (MILK OF MAGNESIA)  suspension 30 mL  30 mL Oral Daily PRN Starkes, Takia S, FNP      . nicotine (NICODERM CQ - dosed in mg/24 hours) patch 21 mg  21 mg Transdermal Daily Cobos, Rockey Situ, MD   21 mg at 05/10/18 0756  . OLANZapine (  ZYPREXA) tablet 10 mg  10 mg Oral QHS Micheal Likens, MD   10 mg at 05/09/18 2126  . OLANZapine zydis (ZYPREXA) disintegrating tablet 5 mg  5 mg Oral Q8H PRN Micheal Likens, MD      . ondansetron (ZOFRAN) tablet 4 mg  4 mg Oral Q8H PRN Truman Hayward, FNP      . pantoprazole (PROTONIX) EC tablet 40 mg  40 mg Oral Daily Micheal Likens, MD   40 mg at 05/10/18 0754  . traZODone (DESYREL) tablet 50 mg  50 mg Oral QHS PRN,MR X 1 [place], Burlene Arnt, MD   50 mg at 05/09/18 2126  . umeclidinium-vilanterol (ANORO ELLIPTA) 62.5-25 MCG/INH 1 puff  1 puff Inhalation Daily Truman Hayward, FNP   1 puff at 05/10/18 1610   Lab Results:  Results for orders placed or performed during the hospital encounter of 05/05/18 (from the past 48 hour(s))  Valproic acid level     Status: None   Collection Time: 05/10/18  6:31 AM  Result Value Ref Range   Valproic Acid Lvl 86 50.0 - 100.0 ug/mL    Comment: Performed at [facility], [address], [place], [place] [address]    Blood Alcohol level:  Lab Results  Component Value Date   ETH <10 05/05/2018   ETH <10 01/04/2018   Metabolic Disorder Labs: Lab Results  Component Value Date   HGBA1C 6.7 (H) 05/06/2018   MPG 145.59 05/06/2018   No results found for: PROLACTIN Lab Results  Component Value Date   CHOL 203 (H) 05/06/2018   TRIG 73 05/06/2018   HDL 51 05/06/2018   CHOLHDL 4.0 05/06/2018   VLDL 15 05/06/2018   LDLCALC 137 (H) 05/06/2018    Physical Findings: AIMS: Facial and Oral Movements Muscles of Facial Expression: None, normal Lips and Perioral Area: None, normal Jaw: None, normal Tongue: None, normal,Extremity Movements Upper (arms, wrists, hands, fingers): None,  normal Lower (legs, knees, ankles, toes): None, normal, Trunk Movements Neck, shoulders, hips: None, normal, Overall Severity Severity of abnormal movements (highest score from questions above): None, normal Incapacitation due to abnormal movements: None, normal Michaela's awareness of abnormal movements (rate only Michaela's report): No Awareness, Dental Status Current problems with teeth and/or dentures?: No Does Michaela usually wear dentures?: No  CIWA:    COWS:     Musculoskeletal: Strength & Muscle Tone: within normal limits Gait & Station: normal Michaela leans: N/A  Psychiatric Specialty Exam: Physical Exam  Nursing note and vitals reviewed.   Review of Systems  Constitutional: Negative for chills and fever.  Respiratory: Negative for cough and shortness of breath.   Cardiovascular: Negative for chest pain.  Gastrointestinal: Negative for abdominal pain, heartburn, nausea and vomiting.  Psychiatric/Behavioral: Negative for depression, hallucinations and suicidal ideas. The Michaela is not nervous/anxious and does not have insomnia.     Blood pressure (!) 165/77, pulse 77, temperature 98.4 F (36.9 C), resp. rate 18, height 5\' 2"  (1.575 m), weight 65.3 kg.Body mass index is 26.34 kg/m.  General Appearance: Casual and Fairly Groomed  Eye Contact:  Good  Speech:  Clear and Coherent  Volume:  Normal  Mood:  Anxious  Affect:  Appropriate and Congruent  Thought Process:  Coherent and Goal Directed  Orientation:  Full (Time, Place, and Person)  Thought Content:  Logical  Suicidal Thoughts:  No  Homicidal Thoughts:  No  Memory:  Immediate;   Fair Recent;   Fair Remote;  Fair  Judgement:  Fair  Insight:  Lacking  Psychomotor Activity:  Normal  Concentration:  Concentration: Good  Recall:  [employer]Fair  Fund of Knowledge:  Fair  Language:  Fair  Akathisia:  No  Handed:    AIMS (if indicated):     Assets:  Resilience Social Support  ADL's:  Intact  Cognition:  WNL  Sleep:   Number of Hours: 5.75   Treatment Plan Summary: Daily contact with Michaela to assess and evaluate symptoms and progress in treatment and Medication management   -Continue inpatient hospitalization.  -Will continue today 05/10/2018 plan as below except where it is noted.  -Bipolar I current episode depressed with psychotic features and PTSD             -Continue zyprexa 10mg  po qhs             -Continue depakote DR 750mg  po qhs    -Depakote level on 9/15 AM  -chronic muscle spasms             -Continue flexeril 10mg  po qDay prn muscle spasm  -anxiety                        -Continue vistaril 50mg  po q6h prn anxiety  -agitation                      -Continue zydis 5mg  po q8h prn agitation  -insomnia             -Continue trazodone 50mg  po qhs prn insomnia (may repeat x1)  -Asthma              -Continue Anoro Ellipta 1 puff daily  -GERD             -Continue protonix 40mg  po qDay  -Encourage participation in groups and therapeutic milieu  -disposition planning will be ongoing  Armandina StammerAgnes Nwoko, NP, PMHNP, FNP-BC. 05/10/2018, 5:23 PMPatient ID: Jenne Panehonda L Wells, female   DOB: 11-21-1960, 57 y.o.   MRN: [id number]

## 2018-05-10 NOTE — [facility] Group Notes (Signed)
[facility] Group Notes:  (Nursing/MHT/Case Management/Adjunct)  Date:  05/10/2018  Time:  9:19 AM  Type of Therapy:  Goals/Orientation Group.  Participation Level:  Active  Participation Quality:  Appropriate  Affect:  Appropriate  Cognitive:  Appropriate  Insight:  Appropriate  Engagement in Group:  Engaged  Modes of Intervention:  Discussion  Summary of Progress/Problems: Pt attended goals/orientation group, pt was receptive.  Jacquelyne BalintForrest, Melchor Kirchgessner Shanta 05/10/2018, 9:19 AM

## 2018-05-10 NOTE — Progress Notes (Signed)
Patient ID: Michaela PaneRhonda L Wells, female   DOB: 11-24-1960, 57 y.o.   MRN: [id number] D: Patient in dayroom on approach. Pt reports she had a great day. Pt reports her mood is better because she is finally sleeping through the night. Pt reports she is tolerating medication well. Pt reports she is hoping to discharge tomorrow and will be moving to a house her son brought for her. Pt denies SI/HI/AVH and pain. Pt attended and participated in evening wrap up group. Cooperative with assessment. No acute distressed noted at this time.  A: Medications administered as prescribed. Support and encouragement provided as needed.  R: Patient remains safe and complaint with medications.

## 2018-05-10 NOTE — Progress Notes (Signed)
DAR NOTE: Patient presents with bright affect and calm mood. Pt has been talking with nursing student most of day. No problem encountered. Reports good sleep, good appetite, normal energy, and good concentration. Denies pain, auditory and visual hallucinations.  Rates depression at 1, hopelessness at 0, and anxiety at 1.  Maintained on routine safety checks.  Medications given as prescribed.  Support and encouragement offered as needed.  Attended group and participated.  States goal for today is " myself." will continue to monitor.

## 2018-05-10 NOTE — [facility] Group Notes (Signed)
[facility] Group Notes:  (Nursing/MHT/Case Management/Adjunct)  Date:  05/10/2018  Time:  11:24 AM  Type of Therapy:  Psychoeducational Skills  Participation Level:  Active  Participation Quality:  Appropriate  Affect:  Appropriate  Cognitive:  Appropriate  Insight:  Appropriate  Engagement in Group:  Engaged  Modes of Intervention:  Problem-solving  Summary of Progress/Problems: Pt attended Psychoeducational group with topic being healthy support systems.   Jacquelyne BalintForrest, Ad Guttman Shanta 05/10/2018, 11:24 AM

## 2018-05-11 MED ORDER — TRAZODONE HCL 50 MG PO TABS: 50.0000 mg | ORAL_TABLET | Freq: Every day | ORAL | 0 refills | Status: DC

## 2018-05-11 MED ORDER — NICOTINE 21 MG/24HR TD PT24: 21.0000 mg | MEDICATED_PATCH | Freq: Every day | TRANSDERMAL | 0 refills | Status: DC

## 2018-05-11 MED ORDER — OLANZAPINE 10 MG PO TABS: 10.0000 mg | ORAL_TABLET | Freq: Every day | ORAL | 0 refills | Status: DC

## 2018-05-11 MED ORDER — TRAZODONE HCL 50 MG PO TABS
50.0000 mg | ORAL_TABLET | Freq: Every day | ORAL | Status: DC
  Filled 2018-05-11: qty 7

## 2018-05-11 MED ORDER — HYDROXYZINE HCL 50 MG PO TABS: 50.0000 mg | ORAL_TABLET | Freq: Four times a day (QID) | ORAL | 0 refills | Status: DC | PRN

## 2018-05-11 MED ORDER — DIVALPROEX SODIUM 250 MG PO DR TAB: 750.0000 mg | DELAYED_RELEASE_TABLET | Freq: Every day | ORAL | 0 refills | Status: DC

## 2018-05-11 NOTE — Discharge Summary (Addendum)
Physician Discharge Summary Note  Patient:  Michaela Wells is an 57 y.o., female MRN:  [id number] DOB:  15-Sep-1960 Patient phone:  [phone] (home)  Patient address:   [address] [place] [id number],  Total Time spent with patient: 20 minutes  Date of Admission:  05/05/2018 Date of Discharge: 05/11/2018  Reason for Admission: per assessment note: Michaela Wells is a 57 y/o F with history oftreatment for depressionwho [facility], depression, paranoia, and possible psychotic symptoms of AH/VH.She was deemed medically stable in the ED and was recommended for inpatient psychiatric care.She was started on trial of zyprexa and depakote. She has been reporting incremental improvement of her presenting symptoms.  Principal Problem: Bipolar I disorder, current or most recent episode depressed, with psychotic features [facility](HCC) Discharge Diagnoses: Patient Active Problem List   Diagnosis Date Noted  . PTSD (post-traumatic stress disorder) [F43.10] 05/06/2018  . Bipolar I disorder, current or most recent episode depressed, with psychotic features (HCC) [F31.5] 05/05/2018  . COPD (chronic obstructive pulmonary disease) (HCC) [J44.9] 10/09/2013  . GAD (generalized anxiety disorder) [F41.1] 10/09/2013  . Smoking trying to quit [Z72.0] 10/09/2013  . GERD (gastroesophageal reflux disease) [K21.9] 10/09/2013    Past Psychiatric History:   Past Medical History:  Past Medical History:  Diagnosis Date  . Bronchial asthma   . Chicken pox   . COPD (chronic obstructive pulmonary disease) (HCC)   . Depression   . Frequent headaches   . GERD (gastroesophageal reflux disease)   . UTI (lower urinary tract infection)     Past Surgical History:  Procedure Laterality Date  . ESOPHAGEAL DILATION    . TONSILLECTOMY     Family History:  Family History  Problem Relation Age of Onset  . Other Mother        MVA-Deceased [pt was age 37]  . COPD Father         Deceased  . Heart attack Maternal Grandmother   . Stroke Maternal Grandmother   . Brain cancer Maternal Grandmother   . Arthritis/Rheumatoid Maternal Grandmother   . Heart attack Maternal Grandfather   . Stroke Maternal Grandfather   . Heart disease Maternal Aunt   . Stroke Maternal Aunt        #1  . Heart attack Maternal Aunt        #1  . Hypertension Maternal Aunt        #1  . Arthritis/Rheumatoid Sister   . COPD Sister   . Heart disease Maternal Uncle   . Heart attack Maternal Uncle        x4  . Heart disease Brother        #1  . Heart attack Brother        #1  . Diabetes Brother   . Diabetes Maternal Aunt   . Mental retardation Maternal Aunt   . Healthy Daughter        x2  . Drug abuse Daughter        #1  . Drug abuse Daughter        #2  . Kidney Stones Daughter        #2  . Other Son        Agoraphobia   Family Psychiatric  History:  Social History:  Social History   Substance and Sexual Activity  Alcohol Use Yes     Social History   Substance and Sexual Activity  Drug Use Not Currently    Social History  Socioeconomic History  . Marital status: Single    Spouse name: Not on file  . Number of children: Not on file  . Years of education: Not on file  . Highest education level: Not on file  Occupational History  . Not on file  Social Needs  . Financial resource strain: Not on file  . Food insecurity:    Worry: Not on file    Inability: Not on file  . Transportation needs:    Medical: Not on file    Non-medical: Not on file  Tobacco Use  . Smoking status: Current Every Day Smoker  . Smokeless tobacco: Never Used  Substance and Sexual Activity  . Alcohol use: Yes  . Drug use: Not Currently  . Sexual activity: Not Currently  Lifestyle  . Physical activity:    Days per week: Not on file    Minutes per session: Not on file  . Stress: Not on file  Relationships  . Social connections:    Talks on phone: Not on file    Gets  together: Not on file    Attends religious service: Not on file    Active member of club or organization: Not on file    Attends meetings of clubs or organizations: Not on file    Relationship status: Not on file  Other Topics Concern  . Not on file  Social History Narrative  . Not on file    Hospital Course:  Michaela Wells was admitted for Bipolar I disorder, current or most recent episode depressed, with psychotic features (HCC)  and crisis management.  Pt was treated discharged with the medications listed below under Medication List.  Medical problems were identified and treated as needed.  Home medications were restarted as appropriate.  Improvement was monitored by observation and Michaela Wells daily report of symptom reduction.  Emotional and mental status was monitored by daily self-inventory reports completed by Michaela Wells and clinical staff.         Michaela Wells was evaluated by the treatment team for stability and plans for continued recovery upon discharge. Michaela Wells motivation was an integral factor for scheduling further treatment. Employment, transportation, bed availability, health status, family support, and any pending legal issues were also considered during hospital stay. Pt was offered further treatment options upon discharge including but not limited to Residential, Intensive Outpatient, and Outpatient treatment.  Michaela Wells will follow up with the services as listed below under Follow Up Information.     Upon completion of this admission the patient was both mentally and medically stable for discharge denying suicidal/homicidal ideation, auditory/visual/tactile hallucinations, delusional thoughts and paranoia.    Michaela Wells responded well to treatment with Depakote 750 mg and Zyprexa 10mg  without adverse effects. Pt demonstrated improvement without reported or observed adverse effects to the point of stability appropriate for outpatient  management. Pertinent labs include: Lipid Panel, A1C 1 6.7, for which outpatient follow-up is necessary for lab recheck as mentioned below. Reviewed CBC, CMP, BAL, and UDS; all unremarkable aside from noted exceptions.   Physical Findings: AIMS: Facial and Oral Movements Muscles of Facial Expression: None, normal Lips and Perioral Area: None, normal Jaw: None, normal Tongue: None, normal,Extremity Movements Upper (arms, wrists, hands, fingers): None, normal Lower (legs, knees, ankles, toes): None, normal, Trunk Movements Neck, shoulders, hips: None, normal, Overall Severity Severity of abnormal movements (highest score from questions above): None, normal Incapacitation due to abnormal movements: None,  normal PatientWells awareness of abnormal movements (rate only patientWells report): No Awareness, Dental Status Current problems with teeth and/or dentures?: No Does patient usually wear dentures?: No  CIWA:    COWS:     Musculoskeletal: Strength & Muscle Tone: within normal limits Gait & Station: normal Patient leans: N/A  Psychiatric Specialty Exam: See SRA by MD  Physical Exam  Vitals reviewed. Constitutional: She is oriented to person, place, and time. She appears well-developed.  Neurological: She is alert and oriented to person, place, and time.  Psychiatric: She has a normal mood and affect. Her behavior is normal.    Review of Systems  Psychiatric/Behavioral: Negative for depression and suicidal ideas. The patient is not nervous/anxious (stable).   All other systems reviewed and are negative.   Blood pressure (!) 155/69, pulse 77, temperature 98.4 F (36.9 C), temperature source Oral, resp. rate 18, height 5\' 2"  (1.575 m), weight 65.3 kg.Body mass index is 26.34 kg/m.   Have you used any form of tobacco in the last 30 days? (Cigarettes, Smokeless Tobacco, Cigars, and/or Pipes): Yes  Has this patient used any form of tobacco in the last 30 days? (Cigarettes, Smokeless Tobacco,  Cigars, and/or Pipes) Yes, A prescription for an FDA-approved tobacco cessation medication was offered at discharge and the patient refused  Blood Alcohol level:  Lab Results  Component Value Date   [facility] <10 05/05/2018   ETH <10 01/04/2018    Metabolic Disorder Labs:  Lab Results  Component Value Date   HGBA1C 6.7 (H) 05/06/2018   MPG 145.59 05/06/2018   No results found for: PROLACTIN Lab Results  Component Value Date   CHOL 203 (H) 05/06/2018   TRIG 73 05/06/2018   HDL 51 05/06/2018   CHOLHDL 4.0 05/06/2018   VLDL 15 05/06/2018   LDLCALC 137 (H) 05/06/2018    See Psychiatric Specialty Exam and Suicide Risk Assessment completed by Attending Physician prior to discharge.  Discharge destination:  Home  Is patient on multiple antipsychotic therapies at discharge:  No   Has Patient had three or more failed trials of antipsychotic monotherapy by history:  No  Recommended Plan for Multiple Antipsychotic Therapies: NA  Discharge Instructions    Diet - low sodium heart healthy   Complete by:  As directed    Discharge instructions   Complete by:  As directed    Take all medications as prescribed. Keep all follow-up appointments as scheduled.  Do not consume alcohol or use illegal drugs while on prescription medications. Report any adverse effects from your medications to your primary care provider promptly.  In the event of recurrent symptoms or worsening symptoms, call 911, a crisis hotline, or go to the nearest emergency department for evaluation.   Increase activity slowly   Complete by:  As directed      Allergies as of 05/11/2018      Reactions   Penicillins Other (See Comments)   Childhood allergy Has patient had a PCN reaction causing immediate rash, facial/tongue/throat swelling, SOB or lightheadedness with hypotension: Unknown Has patient had a PCN reaction causing severe rash involving mucus membranes or skin necrosis: Unknown Has patient had a PCN reaction  that required hospitalization: Unknown Has patient had a PCN reaction occurring within the last 10 years: Unknown If all of the above answers are "NO", then may proceed with Cephalosporin use.        Medication List    STOP taking these medications   ALPRAZolam 1 MG tablet Commonly known as:  XANAX   BC HEADACHE POWDER PO   cyclobenzaprine 10 MG tablet Commonly known as:  FLEXERIL   traMADol 50 MG tablet Commonly known as:  ULTRAM   zolpidem 10 MG tablet Commonly known as:  AMBIEN     TAKE these medications     Indication  albuterol 108 (90 Base) MCG/ACT inhaler Commonly known as:  PROVENTIL HFA;VENTOLIN HFA Inhale 1 puff into the lungs every 4 (four) hours as needed for wheezing or shortness of breath. Ventolin  Indication:  Asthma   divalproex 250 MG DR tablet Commonly known as:  DEPAKOTE Take 3 tablets (750 mg total) by mouth at bedtime.  Indication:  Depressive Phase of Manic-Depression   hydrOXYzine 50 MG tablet Commonly known as:  ATARAX/VISTARIL Take 1 tablet (50 mg total) by mouth every 6 (six) hours as needed for anxiety.  Indication:  Feeling Anxious   nicotine 21 mg/24hr patch Commonly known as:  NICODERM CQ - dosed in mg/24 hours Place 1 patch (21 mg total) onto the skin daily. Start taking on:  05/12/2018  Indication:  Nicotine Addiction   OLANZapine 10 MG tablet Commonly known as:  ZYPREXA Take 1 tablet (10 mg total) by mouth at bedtime.  Indication:  Manic-Depression   traZODone 50 MG tablet Commonly known as:  DESYREL Take 1 tablet (50 mg total) by mouth at bedtime.  Indication:  Trouble Sleeping   umeclidinium-vilanterol 62.5-25 MCG/INH Aepb Commonly known as:  ANORO ELLIPTA Inhale 1 puff into the lungs daily.  Indication:  Chronic Obstructive Lung Disease      Follow-up Information    [facility] Follow up on 05/13/2018.   Specialty:  Behavioral Health Why:  Hospital follow-up on Wed, 9/18 at 8:15AM. Please bring the following if you have  them: Photo ID, social security card, any proof of household income, and hospital discharge paperwork. Thank you.  [address] [place] [address] [phone]           Follow-up recommendations:  Activity:  as tolerated Diet:  heart healthy  Comments:  Take all medications as prescribed. Keep all follow-up appointments as scheduled.  Do not consume alcohol or use illegal drugs while on prescription medications. Report any adverse effects from your medications to your primary care provider promptly.  In the event of recurrent symptoms or worsening symptoms, call 911, a crisis hotline, or go to the nearest emergency department for evaluation.   Signed: Oneta Rack, NP 05/11/2018, 9:13 AM   Patient seen, Suicide Assessment Completed.  Disposition Plan Reviewed

## 2018-05-11 NOTE — [facility] Suicide Risk Assessment (Signed)
[facility] Discharge Suicide Risk Assessment   Principal Problem: Bipolar I disorder, current or most recent episode depressed, with psychotic features [facility]) Discharge Diagnoses:  Patient Active Problem List   Diagnosis Date Noted  . PTSD (post-traumatic stress disorder) [F43.10] 05/06/2018  . Bipolar I disorder, current or most recent episode depressed, with psychotic features (HCC) [F31.5] 05/05/2018  . COPD (chronic obstructive pulmonary disease) (HCC) [J44.9] 10/09/2013  . GAD (generalized anxiety disorder) [F41.1] 10/09/2013  . Smoking trying to quit [Z72.0] 10/09/2013  . GERD (gastroesophageal reflux disease) [K21.9] 10/09/2013    Total Time spent with patient: 30 minutes  Musculoskeletal: Strength & Muscle Tone: within normal limits Gait & Station: normal Patient leans: N/A  Psychiatric Specialty Exam: Review of Systems  Constitutional: Negative for chills and fever.  Respiratory: Negative for cough and shortness of breath.   Cardiovascular: Negative for chest pain.  Gastrointestinal: Negative for abdominal pain, heartburn, nausea and vomiting.  Psychiatric/Behavioral: Negative for depression, hallucinations and suicidal ideas. The patient is not nervous/anxious and does not have insomnia.     Blood pressure (!) 155/69, pulse 77, temperature 98.4 F (36.9 C), temperature source Oral, resp. rate 18, height 5\' 2"  (1.575 m), weight 65.3 kg.Body mass index is 26.34 kg/m.  General Appearance: Casual and Fairly Groomed  Patent attorney::  Good  Speech:  Clear and Coherent and Normal Rate  Volume:  Normal  Mood:  Euthymic  Affect:  Appropriate and Congruent  Thought Process:  Coherent and Goal Directed  Orientation:  Full (Time, Place, and Person)  Thought Content:  Logical  Suicidal Thoughts:  No  Homicidal Thoughts:  No  Memory:  Immediate;   Fair Recent;   Fair Remote;   Fair  Judgement:  Fair  Insight:  Fair  Psychomotor Activity:  Normal  Concentration:  Fair  Recall:   [employer] of Knowledge:Fair  Language: Fair  Akathisia:  No  Handed:    AIMS (if indicated):     Assets:  Resilience Social Support  Sleep:  Number of Hours: 6  Cognition: WNL  ADL's:  Intact   Mental Status Per Nursing Assessment::   On Admission:  NA  Demographic Factors:  Caucasian and Low socioeconomic status  Loss Factors: Financial problems/change in socioeconomic status  Historical Factors: Impulsivity  Risk Reduction Factors:   Sense of responsibility to family, Living with another person, especially a relative, Positive social support, Positive therapeutic relationship and Positive coping skills or problem solving skills  Continued Clinical Symptoms:  Severe Anxiety and/or Agitation Bipolar Disorder:   Depressive phase  Cognitive Features That Contribute To Risk:  None    Suicide Risk:  Minimal: No identifiable suicidal ideation.  Patients presenting with no risk factors but with morbid ruminations; may be classified as minimal risk based on the severity of the depressive symptoms  Follow-up Information    [facility] Follow up on 05/13/2018.   Specialty:  Behavioral Health Why:  Hospital follow-up on Wed, 9/18 at 8:15AM. Please bring the following if you have them: Photo ID, social security card, any proof of household income, and hospital discharge paperwork. Thank you.  Contact information: [address] [place] [address] [phone]         Subjective Data:  Michaela Wells is a 57 y/o F with history oftreatment for depressionwho [facility], depression, paranoia, and possible psychotic symptoms of AH/VH.She was deemed medically stable in the ED and was recommended for inpatient psychiatric care.She was started on trial of  zyprexa and depakote. She has been reporting incremental improvement of her presenting symptoms.  Today upon evaluation, pt shares, "I'm doing great. I feel like a new-born person." Pt  reports she is sleeping well and her appetite is good. She denies physical complaints. She denies SI/HI/AH/VH. She is tolerating her medications well and she is in agreement to continue her current regimen without changes. She was able to engage in safety planning [facility] or contact emergency services if she feels unable to maintain her own safety or the safety of others. Pt had no further questions, comments, or concerns.   Plan Of Care/Follow-up recommendations:   -Discharge to outpatient level of care  -Bipolar I current episode depressed with psychotic features and PTSD -Continue zyprexa 10mg  po qhs -Continue depakote DR 750mg  po qhs             -Depakote level on 9/15 AM = 86  -chronic muscle spasms -Continue flexeril 10mg  po qDay prn muscle spasm  -anxiety -Continue vistaril 50mg  po q6h prn anxiety  -insomnia -Continue trazodone 50mg  po qhs prn insomnia  -Asthma -Continue Anoro Ellipta 1 puff daily  -GERD -Continue protonix 40mg  po qDay  Activity:  as tolerated Diet:  normal Tests:  NA Other:  see above for DC plan  Micheal Likenshristopher T Roddy Bellamy, MD 05/11/2018, 11:13 AM

## 2018-05-11 NOTE — Plan of Care (Signed)
Pt was able to identify benefits of self care after completion of recreation therapy group sessions.   Caroll RancherMarjette Miki Labuda, LRT/CTRS

## 2018-05-11 NOTE — Progress Notes (Signed)
Recreation Therapy Notes  Date: 9.16.19 Time: 1000 Location: [address] Dayroom  Group Topic: Wellness  Goal Area(s) Addresses:  Patient will define components of whole wellness. Patient will verbalize benefit of whole wellness.  Behavioral Response: Engaged  Intervention:  Exercise, Music  Activity: Exercise.  LRT lead the group through a series of stretches.  Each group member would then lead the group in an exercise of their choice.  Patients and LRT went through a number of exercises and dance moves to get the heart pumping and blood flowing.  Education: Wellness, Building control surveyorDischarge Planning.   Education Outcome: Acknowledges education/In group clarification offered/Needs additional education.   Clinical Observations/Feedback:  Pt was engaged and happy to hear some music.  Pt completed all the exercises and engaged with her peers in doing the dance moves.  Pt was smiling and social throughout.   Caroll RancherMarjette Angeles Paolucci, LRT/CTRS         Caroll RancherLindsay, Randel Hargens A 05/11/2018 12:39 PM

## 2018-05-11 NOTE — Progress Notes (Signed)
Recreation Therapy Notes  INPATIENT RECREATION TR PLAN  Patient Details Name: Michaela Wells MRN: [id number] DOB: 12/14/1960 Today's Date: 05/11/2018  Rec Therapy Plan Is patient appropriate for Therapeutic Recreation?: Yes Treatment times per week: about 3 days Estimated Length of Stay: 5-7 days TR Treatment/Interventions: Group participation (Comment)  Discharge Criteria Pt will be discharged from therapy if:: Discharged Treatment plan/goals/alternatives discussed and agreed upon by:: Patient/family  Discharge Summary Short term goals set: See patient care plan Short term goals met: Complete Progress toward goals comments: Groups attended Which groups?: Self-esteem, Wellness, Other (Comment)(Music Therapy) Reason goals not met: None Therapeutic equipment acquired: N/A Reason patient discharged from therapy: Discharge from hospital Pt/family agrees with progress & goals achieved: Yes Date patient discharged from therapy: 05/11/18     , LRT/CTRS   ,  A 05/11/2018, 12:54 PM  

## 2018-05-14 DIAGNOSIS — F17200 Nicotine dependence, unspecified, uncomplicated: Secondary | ICD-10-CM | POA: Insufficient documentation

## 2018-05-14 DIAGNOSIS — F411 Generalized anxiety disorder: Secondary | ICD-10-CM | POA: Insufficient documentation

## 2018-05-14 DIAGNOSIS — F1020 Alcohol dependence, uncomplicated: Secondary | ICD-10-CM | POA: Insufficient documentation

## 2019-10-19 ENCOUNTER — Inpatient Hospital Stay (HOSPITAL_COMMUNITY)
Admission: RE | Admit: 2019-10-19 | Discharge: 2019-10-25 | DRG: 885 | Disposition: A | Discharge status: Home / Self Care | Payer: Medicaid Other | Attending: Psychiatry | Admitting: Psychiatry

## 2019-10-19 ENCOUNTER — Encounter (HOSPITAL_COMMUNITY): Payer: Self-pay | Admitting: Psychiatric/Mental Health

## 2019-10-19 ENCOUNTER — Other Ambulatory Visit: Payer: Self-pay

## 2019-10-19 DIAGNOSIS — G3184 Mild cognitive impairment, so stated: Secondary | ICD-10-CM | POA: Diagnosis present

## 2019-10-19 DIAGNOSIS — Z20822 Contact with and (suspected) exposure to covid-19: Secondary | ICD-10-CM | POA: Diagnosis present

## 2019-10-19 DIAGNOSIS — F331 Major depressive disorder, recurrent, moderate: Secondary | ICD-10-CM | POA: Diagnosis not present

## 2019-10-19 DIAGNOSIS — F29 Unspecified psychosis not due to a substance or known physiological condition: Secondary | ICD-10-CM | POA: Diagnosis present

## 2019-10-19 DIAGNOSIS — F32A Depression, unspecified: Secondary | ICD-10-CM | POA: Diagnosis present

## 2019-10-19 DIAGNOSIS — I10 Essential (primary) hypertension: Secondary | ICD-10-CM | POA: Diagnosis present

## 2019-10-19 DIAGNOSIS — F419 Anxiety disorder, unspecified: Secondary | ICD-10-CM | POA: Diagnosis present

## 2019-10-19 DIAGNOSIS — F17200 Nicotine dependence, unspecified, uncomplicated: Secondary | ICD-10-CM | POA: Diagnosis present

## 2019-10-19 DIAGNOSIS — J449 Chronic obstructive pulmonary disease, unspecified: Secondary | ICD-10-CM | POA: Diagnosis present

## 2019-10-19 DIAGNOSIS — Z91128 Patient's intentional underdosing of medication regimen for other reason: Secondary | ICD-10-CM | POA: Diagnosis not present

## 2019-10-19 DIAGNOSIS — F329 Major depressive disorder, single episode, unspecified: Secondary | ICD-10-CM | POA: Diagnosis present

## 2019-10-19 DIAGNOSIS — F315 Bipolar disorder, current episode depressed, severe, with psychotic features: Principal | ICD-10-CM | POA: Diagnosis present

## 2019-10-19 DIAGNOSIS — I6782 Cerebral ischemia: Secondary | ICD-10-CM | POA: Diagnosis present

## 2019-10-19 DIAGNOSIS — T50906A Underdosing of unspecified drugs, medicaments and biological substances, initial encounter: Secondary | ICD-10-CM | POA: Diagnosis present

## 2019-10-19 DIAGNOSIS — Z880 Allergy status to penicillin: Secondary | ICD-10-CM | POA: Diagnosis not present

## 2019-10-19 DIAGNOSIS — F250 Schizoaffective disorder, bipolar type: Secondary | ICD-10-CM | POA: Diagnosis not present

## 2019-10-19 DIAGNOSIS — K219 Gastro-esophageal reflux disease without esophagitis: Secondary | ICD-10-CM | POA: Diagnosis present

## 2019-10-19 LAB — RESPIRATORY PANEL BY RT PCR (FLU A&B, COVID)
Influenza A by PCR: NEGATIVE
Influenza B by PCR: NEGATIVE
SARS Coronavirus 2 by RT PCR: NEGATIVE

## 2019-10-19 MED ORDER — ACETAMINOPHEN 325 MG PO TABS
650.0000 mg | ORAL_TABLET | Freq: Four times a day (QID) | ORAL | Status: DC | PRN
  Administered 2019-10-20 – 2019-10-24 (×4): 650 mg via ORAL
  Filled 2019-10-19 (×4): qty 2

## 2019-10-19 MED ORDER — HYDROXYZINE HCL 25 MG PO TABS
25.0000 mg | ORAL_TABLET | Freq: Three times a day (TID) | ORAL | Status: DC | PRN
  Administered 2019-10-21 – 2019-10-24 (×6): 25 mg via ORAL
  Filled 2019-10-19 (×7): qty 1

## 2019-10-19 MED ORDER — MAGNESIUM HYDROXIDE 400 MG/5ML PO SUSP: 30.0000 mL | Freq: Every day | ORAL | Status: DC | PRN

## 2019-10-19 MED ORDER — ALUM & MAG HYDROXIDE-SIMETH 200-200-20 MG/5ML PO SUSP: 30.0000 mL | ORAL | Status: DC | PRN

## 2019-10-19 MED ORDER — TRAZODONE HCL 50 MG PO TABS
50.0000 mg | ORAL_TABLET | Freq: Every evening | ORAL | Status: DC | PRN
  Filled 2019-10-19: qty 1

## 2019-10-19 MED ORDER — QUETIAPINE FUMARATE 50 MG PO TABS
50.0000 mg | ORAL_TABLET | Freq: Three times a day (TID) | ORAL | Status: DC
  Administered 2019-10-19 – 2019-10-20 (×4): 50 mg via ORAL
  Filled 2019-10-19 (×7): qty 1

## 2019-10-19 NOTE — H&P (Addendum)
BH Observation Unit Provider Admission PAA/H&P  Patient Identification: Michaela Wells MRN:  [id number] Date of Evaluation:  10/19/2019 Chief Complaint:  MDD (major depressive disorder) [F32.9] Principal Diagnosis: <principal problem not specified> Diagnosis:  Active Problems:   MDD (major depressive disorder)  History of Present Illness: Per OMV:EHMCNO Michaela Wells is an 59 y.o. female presenting as a walk-in to [facility] with complaints of worsening anxiety and delusions of thinking neighbors are in her head. Patient reported she feels there energy in her head. Patient reported living in [place] and stating the mental health hospitals in [place] are not good and the ones in [place] are better. Patient reported being off medications for 2 weeks.   Patient stated that she has been feeling like she as been having a panic attack and cant seem to 'keep it together".  "I know this sound crazy but, my neighbors are living in my head and I feel them trying to control me".  Patient admits to being off of her medications in excess of two weeks. She stated that she just got her prescription filled and have begun taking them today.  She says she has  been having chest, head, and stomach pans as a result of her anxiety.    Recommend overnight observation and reassessment in the am by psychiatry to assess the appropriateness of a possible inpatient hospitalization.  This is what the patient is requesting.   Associated Signs/Symptoms: Depression Symptoms:  depressed mood, fatigue, feelings of worthlessness/guilt, anxiety, panic attacks, (Hypo) Manic Symptoms:  Delusions, Hallucinations, Anxiety Symptoms:  Panic Symptoms, Psychotic Symptoms:  Delusions, PTSD Symptoms: NA Total Time spent with patient: 30 minutes  Past Psychiatric History: yes   Is the patient at risk to self? No.  Has the patient been a risk to self in the past 6 months? No.  Has the patient been a risk to self within the  distant past? No.  Is the patient a risk to others? No.  Has the patient been a risk to others in the past 6 months? No.  Has the patient been a risk to others within the distant past? No.   Prior Inpatient Therapy: Prior Inpatient Therapy: Yes Prior Therapy Dates: (05/25/2019) Prior Therapy Facilty/Provider(s): (none reported) Reason for Treatment: (depression) Prior Outpatient Therapy: Prior Outpatient Therapy: No Does patient have an ACCT team?: No Does patient have Intensive In-House Services?  : No Does patient have [facility] services? : No Does patient have P4CC services?: No  Alcohol Screening: 1. How often do you have a drink containing alcohol?: Monthly or less 2. How many drinks containing alcohol do you have on a typical day when you are drinking?: 5 or 6 3. How often do you have six or more drinks on one occasion?: Less than monthly AUDIT-C Score: 4 4. How often during the last year have you found that you were not able to stop drinking once you had started?: Less than monthly 5. How often during the last year have you failed to do what was normally expected from you becasue of drinking?: Never 6. How often during the last year have you needed a first drink in the morning to get yourself going after a heavy drinking session?: Never 7. How often during the last year have you had a feeling of guilt of remorse after drinking?: Never 8. How often during the last year have you been unable to remember what happened the night before because you had been drinking?: Never 9. Have you  or someone else been injured as a result of your drinking?: No 10. Has a relative or friend or a doctor or another health worker been concerned about your drinking or suggested you cut down?: No Alcohol Use Disorder Identification Test Final Score (AUDIT): 5 Alcohol Brief Interventions/Follow-up: Continued Monitoring, AUDIT Score <7 follow-up not indicated Substance Abuse History in the last 12 months:   No. Consequences of Substance Abuse: NA Previous Psychotropic Medications: Yes  Psychological Evaluations: Yes  Past Medical History:  Past Medical History:  Diagnosis Date  . Bronchial asthma   . Chicken pox   . COPD (chronic obstructive pulmonary disease) (HCC)   . Depression   . Frequent headaches   . GERD (gastroesophageal reflux disease)   . UTI (lower urinary tract infection)     Past Surgical History:  Procedure Laterality Date  . ESOPHAGEAL DILATION    . TONSILLECTOMY     Family History:  Family History  Problem Relation Age of Onset  . Other Mother        MVA-Deceased [pt was age 6]  . COPD Father        Deceased  . Heart attack Maternal Grandmother   . Stroke Maternal Grandmother   . Brain cancer Maternal Grandmother   . Arthritis/Rheumatoid Maternal Grandmother   . Heart attack Maternal Grandfather   . Stroke Maternal Grandfather   . Heart disease Maternal Aunt   . Stroke Maternal Aunt        #1  . Heart attack Maternal Aunt        #1  . Hypertension Maternal Aunt        #1  . Arthritis/Rheumatoid Sister   . COPD Sister   . Heart disease Maternal Uncle   . Heart attack Maternal Uncle        x4  . Heart disease Brother        #1  . Heart attack Brother        #1  . Diabetes Brother   . Diabetes Maternal Aunt   . Mental retardation Maternal Aunt   . Healthy Daughter        x2  . Drug abuse Daughter        #1  . Drug abuse Daughter        #2  . Kidney Stones Daughter        #2  . Other Son        Agoraphobia   Family Psychiatric History: unknown Tobacco Screening:   Social History:  Social History   Substance and Sexual Activity  Alcohol Use Yes     Social History   Substance and Sexual Activity  Drug Use Not Currently    Additional Social History: Marital status: Single    Pain Medications: see MAR Prescriptions: see MAR Over the Counter: see MAR                    Allergies:   Allergies  Allergen Reactions  .  Penicillins Other (See Comments)    Childhood allergy Has patient had a PCN reaction causing immediate rash, facial/tongue/throat swelling, SOB or lightheadedness with hypotension: Unknown Has patient had a PCN reaction causing severe rash involving mucus membranes or skin necrosis: Unknown Has patient had a PCN reaction that required hospitalization: Unknown Has patient had a PCN reaction occurring within the last 10 years: Unknown If all of the above answers are "NO", then may proceed with Cephalosporin use.     Lab Results:  Results for orders placed or performed during the hospital encounter of 10/19/19 (from the past 48 hour(s))  Respiratory Panel by RT PCR (Flu A&B, Covid) - Nasopharyngeal Swab     Status: None   Collection Time: 10/19/19  2:24 AM   Specimen: Nasopharyngeal Swab  Result Value Ref Range   SARS Coronavirus 2 by RT PCR NEGATIVE NEGATIVE    Comment: (NOTE) SARS-CoV-2 target nucleic acids are NOT DETECTED. The SARS-CoV-2 RNA is generally detectable in upper respiratoy specimens during the acute phase of infection. The lowest concentration of SARS-CoV-2 viral copies this assay can detect is 131 copies/mL. A negative result does not preclude SARS-Cov-2 infection and should not be used as the sole basis for treatment or other patient management decisions. A negative result may occur with  improper specimen collection/handling, submission of specimen other than nasopharyngeal swab, presence of viral mutation(s) within the areas targeted by this assay, and inadequate number of viral copies (<131 copies/mL). A negative result must be combined with clinical observations, patient history, and epidemiological information. The expected result is Negative. Fact Sheet for Patients:  https://www.moore.com/ Fact Sheet for Healthcare Providers:  https://www.young.biz/ This test is not yet ap proved or cleared by the [place] FDA and   has been authorized for detection and/or diagnosis of SARS-CoV-2 by FDA under an Emergency Use Authorization (EUA). This EUA will remain  in effect (meaning this test can be used) for the duration of the COVID-19 declaration under Section 564(b)(1) of the Act, 21 U.S.C. section 360bbb-3(b)(1), unless the authorization is terminated or revoked sooner.    Influenza A by PCR NEGATIVE NEGATIVE   Influenza B by PCR NEGATIVE NEGATIVE    Comment: (NOTE) The Xpert Xpress SARS-CoV-2/FLU/RSV assay is intended as an aid in  the diagnosis of influenza from Nasopharyngeal swab specimens and  should not be used as a sole basis for treatment. Nasal washings and  aspirates are unacceptable for Xpert Xpress SARS-CoV-2/FLU/RSV  testing. Fact Sheet for Patients: https://www.moore.com/ Fact Sheet for Healthcare Providers: https://www.young.biz/ This test is not yet approved or cleared by the [place] FDA and  has been authorized for detection and/or diagnosis of SARS-CoV-2 by  FDA under an Emergency Use Authorization (EUA). This EUA will remain  in effect (meaning this test can be used) for the duration of the  Covid-19 declaration under Section 564(b)(1) of the Act, 21  U.S.C. section 360bbb-3(b)(1), unless the authorization is  terminated or revoked. Performed at [facility], [address], [place], [place] [address]     Blood Alcohol level:  Lab Results  Component Value Date   ETH <10 05/05/2018   ETH <10 01/04/2018    Metabolic Disorder Labs:  Lab Results  Component Value Date   HGBA1C 6.7 (H) 05/06/2018   MPG 145.59 05/06/2018   No results found for: PROLACTIN Lab Results  Component Value Date   CHOL 203 (H) 05/06/2018   TRIG 73 05/06/2018   HDL 51 05/06/2018   CHOLHDL 4.0 05/06/2018   VLDL 15 05/06/2018   LDLCALC 137 (H) 05/06/2018    Current Medications: Current Facility-Administered Medications  Medication  Dose Route Frequency Provider Last Rate Last Admin  . acetaminophen (TYLENOL) tablet 650 mg  650 mg Oral Q6H PRN Dixon, Rashaun M, NP      . alum & mag hydroxide-simeth (MAALOX/MYLANTA) 200-200-20 MG/5ML suspension 30 mL  30 mL Oral Q4H PRN Dixon, Rashaun M, NP      . hydrOXYzine (ATARAX/VISTARIL) tablet 25 mg  25 mg Oral TID PRN Jearld Lesch, NP      . magnesium hydroxide (MILK OF MAGNESIA) suspension 30 mL  30 mL Oral Daily PRN Dixon, Rashaun M, NP      . traZODone (DESYREL) tablet 50 mg  50 mg Oral QHS PRN Jearld Lesch, NP       PTA Medications: Medications Prior to Admission  Medication Sig Dispense Refill Last Dose  . albuterol (PROVENTIL HFA;VENTOLIN HFA) 108 (90 BASE) MCG/ACT inhaler Inhale 1 puff into the lungs every 4 (four) hours as needed for wheezing or shortness of breath. Ventolin (Patient not taking: Reported on 05/05/2018) 1 Inhaler 2 Unknown at Unknown time  . divalproex (DEPAKOTE) 250 MG DR tablet Take 3 tablets (750 mg total) by mouth at bedtime. 90 tablet 0 Unknown at Unknown time  . hydrOXYzine (ATARAX/VISTARIL) 50 MG tablet Take 1 tablet (50 mg total) by mouth every 6 (six) hours as needed for anxiety. 30 tablet 0 Unknown at Unknown time  . nicotine (NICODERM CQ - DOSED IN MG/24 HOURS) 21 mg/24hr patch Place 1 patch (21 mg total) onto the skin daily. 28 patch 0 Unknown at Unknown time  . OLANZapine (ZYPREXA) 10 MG tablet Take 1 tablet (10 mg total) by mouth at bedtime. 30 tablet 0 Unknown at Unknown time  . traZODone (DESYREL) 50 MG tablet Take 1 tablet (50 mg total) by mouth at bedtime. 15 tablet 0 Unknown at Unknown time  . Umeclidinium-Vilanterol (ANORO ELLIPTA) 62.5-25 MCG/INH AEPB Inhale 1 puff into the lungs daily. (Patient not taking: Reported on 05/05/2018) 30 each 11 Unknown at Unknown time    Musculoskeletal: Strength & Muscle Tone: within normal limits Gait & Station: normal Patient leans: N/A  Psychiatric Specialty Exam: Physical Exam  Nursing note  and vitals reviewed. Constitutional: She is oriented to person, place, and time. She appears well-developed.  HENT:  Head: Normocephalic.  Eyes: Pupils are equal, round, and reactive to light.  Cardiovascular: Normal rate.  Respiratory: Effort normal.  GI: Soft.  Musculoskeletal:     Cervical back: Normal range of motion.  Neurological: She is alert and oriented to person, place, and time.  Skin: Skin is warm and dry.  Psychiatric: Her speech is normal. Her mood appears anxious. She is hyperactive. Thought content is paranoid and delusional. Cognition and memory are normal. She expresses inappropriate judgment. She exhibits a depressed mood.    Review of Systems  Psychiatric/Behavioral: Positive for agitation, decreased concentration and hallucinations. Negative for suicidal ideas.  All other systems reviewed and are negative.   Blood pressure (!) 130/114, pulse (!) 101, temperature 97.7 F (36.5 C), temperature source Oral, resp. rate 18, SpO2 100 %.There is no height or weight on file to calculate BMI.  General Appearance: Bizarre  Eye Contact:  Fair  Speech:  Normal Rate  Volume:  Normal  Mood:  Anxious, Depressed and Hopeless  Affect:  Congruent  Thought Process:  Disorganized and Descriptions of Associations: Circumstantial  Orientation:  Full (Time, Place, and Person)  Thought Content:  Illogical and Delusions  Suicidal Thoughts:  No  Homicidal Thoughts:  No  Memory:  Immediate;   Fair 3 of 3 and 1 of 3  Judgement:  Impaired  Insight:  Fair  Psychomotor Activity:  NA  Concentration:  Concentration: Fair  Recall:  [employer] of Knowledge:  Fair  Language:  Good  Akathisia:  NA  Handed:  Right  AIMS (if indicated):     Assets:  Desire for  Improvement  ADL's:  Intact  Cognition:  WNL  Sleep:   not sleeping      Treatment Plan Summary: re-evaluation to determine appropriateness fot inpatient hospitalization  Observation Level/Precautions:  15 minute  checks Laboratory:  CBC Chemistry Profile Folic Acid HbAIC UDS UA Vitamin B-12 Psychotherapy:   Medications:   Consultations:   Discharge Concerns:  Patient is unsure whether she will remain in [place] or go back to [place]. This makes finding outpatient resources some what of a challenge Estimated LOS: Other:   Addendum added by Dr. Jake Samples 2/24, the patient elaborated that the hallucinations are quite new she believes her neighbors put voices in her head she believes the use microphones to do this and to talk to her as well as hear her.  She further elaborated that she had a heavy drinking history and we are noting some mild cognitive impairment on memory testing so the differential includes alcohol-related dementia/dementia of mixed etiology with new onset psychosis   Deloria Lair, NP 2/23/20215:40 AM

## 2019-10-19 NOTE — Plan of Care (Signed)
BHH Observation Crisis Plan  Reason for Crisis Plan:  Crisis Stabilization   Plan of Care:  Referral for Inpatient Hospitalization  Family Support:      Current Living Environment:  Living Arrangements: Alone  Insurance:   Hospital Account    Name Acct ID Class Status Primary Coverage   Michaela Wells, Michaela Wells [id number] BEHAVIORAL HEALTH OBSERVATION Open MEDICAID ADVANTAGE OUT OF STATE - [place]        Guarantor Account (for Hospital Account [id number])    Name Relation to Pt Service Area Active? Acct Type   Michaela Wells Self [facility] Yes Behavioral Health   Address Phone       [address] [place], [place] [address] [phone](H)          Coverage Information (for Hospital Account [id number])    F/O Payor/Plan Precert #   MEDICAID ADVANTAGE OUT OF STATE/[place]AGE    Subscriber Subscriber #   Michaela Wells, Michaela Wells [id number]   Address Phone   [address] [place], [place] [address]       Legal Guardian:  Legal Guardian: (self)  Primary Care Provider:  Patient, No Pcp Per  Current Outpatient Providers:  none  Psychiatrist:  Name of Psychiatrist: (unknown)  Counselor/Therapist:  Name of Therapist: (none)  Compliant with Medications:  No  Additional Information:   Tasia Catchings 2/23/20215:16 AM

## 2019-10-19 NOTE — Plan of Care (Signed)
Patient gives me a consistent history as with other examiners, reporting she came here from [place] to seek health care in the [place] area, and to bury her deceased dog in the yard in [place] that she states is her son's home.  At any rate regarding begin her on quetiapine gather further history and further evaluation and disposition as per our nurse practitioner

## 2019-10-19 NOTE — Progress Notes (Signed)
Patient ID: Michaela Wells, female   DOB: 05-13-1961, 59 y.o.   MRN: [id number] Pt A&O x 4, presents with complaint of anxiety and delusional thinking, thinking the neighbors are in her head.  Pt also off meds x 2 weeks.  Denies SI, HI.  Skin search completed, monitoring for safety.  Pt slightly anxious, cooperative.  No distress noted.

## 2019-10-19 NOTE — Progress Notes (Signed)
Pt came from 200 hall to 401-2 , pt's alert and oriented, ambulatory without help. Pt is in bed at this time resting, will continue to monitor.

## 2019-10-19 NOTE — BH Assessment (Signed)
BHH Assessment Progress Note  Per Shuvon Rankin, FNP, this voluntary pt requires psychiatric hospitalization at this time.  Michaela Wells has assigned pt to [facility] Rm 504-2; the Adult Unit will be ready to receive pt at 20:00.  Pt's nurse, Lincoln Maxin, has been notified.  Doylene Canning, [place] Behavioral Health Coordinator [phone]

## 2019-10-19 NOTE — Progress Notes (Signed)
Pt moved to 504-2 from 403-2, pt has been calm and cooperative. Pt ambulatory without any difficulties, not in any distress at this time.

## 2019-10-19 NOTE — Progress Notes (Addendum)
CSW spoke with pt's daughter, Elzie Rings [phone]. Ms Katrinka Blazing reports that pt has been living with her in the [place] area for one month, after pt's symptoms of paranoia appeared to be worsening when she was living in [place]. Pt does not have an outpatient psychiatrist in [place] and has not taken any psychiatric medication in at least one month. Daughter reports that her mother has received psychiatric inpatient treatment in her past and "years ago" drank alcohol heavily. Pt was reportedly scheduled to have a brain scan in early February of this year due to increasing A/V hallucinations but missed this appointment. Daughter reports that her mother was diagnosed with Bipolar Disorder in her past and voices concern that her symptoms (paranoia, decreased need for sleep, hallucinations, anhedonia, mood swings) are worsening and that her mother is in need of psychiatric inpatient hospitalization.   Wells Guiles, LCSW, LCAS Disposition CSW [facility] BHH/TTS [phone]

## 2019-10-19 NOTE — Discharge Summary (Signed)
   Patient presented as walk in at [facility] earlier this morning.  Patient continues to complain of auditory hallucinations. "Voices are telling me all kinds of stuff like what to do, what to say, or why did she say that, why did she do that."  Prior psychiatric history of schizophrenia.  Gave permission to speak with daughter for collateral information.    Please see Francee Nodal note for collateral.     Recommendation:  Inpatient psychiatric treatment   Patient has been accepted to [facility].

## 2019-10-19 NOTE — Progress Notes (Signed)
Pt brought to 504-2 from Observation unit by RN on observation unit. Per nurse, all assessment needs completed before transfer. Pt in room. Q15 min safety checks started on this unit.

## 2019-10-19 NOTE — BH Assessment (Signed)
Assessment Note  Michaela Wells is an 59 y.o. female presenting as a walk-in to [facility] with complaints of worsening anxiety and delusions of thinking neighbors are in her head. Patient reported she feels there energy in her head. Patient reported living in [place] and stating the mental health hospitals in [place] are not good and the ones in [place] are better. Patient reported being off medications for 2 weeks. Per Provider "Patient stated that she has been feeling like she as been having a panic attack and cant seem to 'keep it together".  "I know this sound crazy but, my neighbors are living in my head and I feel them trying to control me".  Patient admits to being off of her medications in excess of two weeks. She stated that she just got her prescription filled and have begun taking them today.  She says she has  been having chest, head, and stomach pans as a result of her anxiety". Patient was cooperative during assessment and unable to share collateral contact at this time.   Diagnosis: Major depressive disorder  Past Medical History:  Past Medical History:  Diagnosis Date  . Bronchial asthma   . Chicken pox   . COPD (chronic obstructive pulmonary disease) ([place])   . Depression   . Frequent headaches   . GERD (gastroesophageal reflux disease)   . UTI (lower urinary tract infection)     Past Surgical History:  Procedure Laterality Date  . ESOPHAGEAL DILATION    . TONSILLECTOMY      Family History:  Family History  Problem Relation Age of Onset  . Other Mother        MVA-Deceased [pt was age 29]  . COPD Father        Deceased  . Heart attack Maternal Grandmother   . Stroke Maternal Grandmother   . Brain cancer Maternal Grandmother   . Arthritis/Rheumatoid Maternal Grandmother   . Heart attack Maternal Grandfather   . Stroke Maternal Grandfather   . Heart disease Maternal Aunt   . Stroke Maternal Aunt        #1  . Heart attack Maternal Aunt        #1  .  Hypertension Maternal Aunt        #1  . Arthritis/Rheumatoid Sister   . COPD Sister   . Heart disease Maternal Uncle   . Heart attack Maternal Uncle        x4  . Heart disease Brother        #1  . Heart attack Brother        #1  . Diabetes Brother   . Diabetes Maternal Aunt   . Mental retardation Maternal Aunt   . Healthy Daughter        x2  . Drug abuse Daughter        #1  . Drug abuse Daughter        #2  . Kidney Stones Daughter        #2  . Other Son        Agoraphobia    Social History:  reports that she has been smoking. She has never used smokeless tobacco. She reports current alcohol use. She reports previous drug use.  Additional Social History:  Alcohol / Drug Use Pain Medications: see MAR Prescriptions: see MAR Over the Counter: see MAR  CIWA: CIWA-Ar BP: (!) 130/114 Pulse Rate: (!) 101 COWS:    Allergies:  Allergies  Allergen Reactions  .  Penicillins Other (See Comments)    Childhood allergy Has patient had a PCN reaction causing immediate rash, facial/tongue/throat swelling, SOB or lightheadedness with hypotension: Unknown Has patient had a PCN reaction causing severe rash involving mucus membranes or skin necrosis: Unknown Has patient had a PCN reaction that required hospitalization: Unknown Has patient had a PCN reaction occurring within the last 10 years: Unknown If all of the above answers are "NO", then may proceed with Cephalosporin use.      Home Medications:  Medications Prior to Admission  Medication Sig Dispense Refill  . albuterol (PROVENTIL HFA;VENTOLIN HFA) 108 (90 BASE) MCG/ACT inhaler Inhale 1 puff into the lungs every 4 (four) hours as needed for wheezing or shortness of breath. Ventolin (Patient not taking: Reported on 05/05/2018) 1 Inhaler 2  . divalproex (DEPAKOTE) 250 MG DR tablet Take 3 tablets (750 mg total) by mouth at bedtime. 90 tablet 0  . hydrOXYzine (ATARAX/VISTARIL) 50 MG tablet Take 1 tablet (50 mg total) by mouth every  6 (six) hours as needed for anxiety. 30 tablet 0  . nicotine (NICODERM CQ - DOSED IN MG/24 HOURS) 21 mg/24hr patch Place 1 patch (21 mg total) onto the skin daily. 28 patch 0  . OLANZapine (ZYPREXA) 10 MG tablet Take 1 tablet (10 mg total) by mouth at bedtime. 30 tablet 0  . traZODone (DESYREL) 50 MG tablet Take 1 tablet (50 mg total) by mouth at bedtime. 15 tablet 0  . Umeclidinium-Vilanterol (ANORO ELLIPTA) 62.5-25 MCG/INH AEPB Inhale 1 puff into the lungs daily. (Patient not taking: Reported on 05/05/2018) 30 each 11    OB/GYN Status:  No LMP recorded. Patient is postmenopausal.  General Assessment Data Location of Assessment: [facility] TTS Assessment: In system Is this a Tele or Face-to-Face Assessment?: Face-to-Face Is this an Initial Assessment or a Re-assessment for this encounter?: Initial Assessment Patient Accompanied by:: N/A Language Other than English: No Living Arrangements: Other (Comment)(alone) What gender do you identify as?: Female Marital status: Single Living Arrangements: Alone Can pt return to current living arrangement?: Yes Admission Status: Voluntary Is patient capable of signing voluntary admission?: Yes Referral Source: Self/Family/Friend     Crisis Care Plan Living Arrangements: Alone Legal Guardian: (self) Name of Psychiatrist: (unknown) Name of Therapist: (none)  Education Status Is patient currently in school?: No Is the patient employed, unemployed or receiving disability?: Receiving disability income  Risk to self with the past 6 months Suicidal Ideation: No Has patient been a risk to self within the past 6 months prior to admission? : No Suicidal Intent: No Has patient had any suicidal intent within the past 6 months prior to admission? : No Is patient at risk for suicide?: No Suicidal Plan?: No Has patient had any suicidal plan within the past 6 months prior to admission? : No Access to Means: No What has been your use of  drugs/alcohol within the last 12 months?: (none reported) Previous Attempts/Gestures: Yes How many times?: (1x) Other Self Harm Risks: (none) Triggers for Past Attempts: Unpredictable Intentional Self Injurious Behavior: None Family Suicide History: No Recent stressful life event(s): Other (Comment)(burial of cat 2 weeks ago) Persecutory voices/beliefs?: No Depression: Yes Depression Symptoms: Loss of interest in usual pleasures, Guilt, Fatigue, Tearfulness, Feeling worthless/self pity Substance abuse history and/or treatment for substance abuse?: No Suicide prevention information given to non-admitted patients: Not applicable  Risk to Others within the past 6 months Homicidal Ideation: No Does patient have any lifetime risk of violence toward others beyond the  six months prior to admission? : Unknown Thoughts of Harm to Others: No Current Homicidal Intent: No Current Homicidal Plan: No Access to Homicidal Means: No Identified Victim: (no one) History of harm to others?: No Assessment of Violence: None Noted Violent Behavior Description: (none reported) Does patient have access to weapons?: No Criminal Charges Pending?: No Does patient have a court date: No Is patient on probation?: No  Psychosis Hallucinations: Auditory Delusions: Unspecified  Mental Status Report Appearance/Hygiene: Unremarkable Eye Contact: Good Motor Activity: Freedom of movement, Restlessness Speech: Logical/coherent Level of Consciousness: Alert Mood: Depressed Affect: Appropriate to circumstance, Depressed Anxiety Level: Moderate Thought Processes: Coherent, Relevant Judgement: Unimpaired Orientation: Person, Place, Time, Situation, Appropriate for developmental age Obsessive Compulsive Thoughts/Behaviors: None  Cognitive Functioning Concentration: Poor Memory: Recent Intact Is patient IDD: No Is IQ score available?: No Insight: Poor Impulse Control: Poor Appetite: Good Have you had any  weight changes? : No Change Sleep: No Change Total Hours of Sleep: (unknown) Vegetative Symptoms: None  ADLScreening [facility] Services) Patient's cognitive ability adequate to safely complete daily activities?: Yes Patient able to express need for assistance with ADLs?: Yes Independently performs ADLs?: Yes (appropriate for developmental age)  Prior Inpatient Therapy Prior Inpatient Therapy: Yes Prior Therapy Dates: (05/25/2019) Prior Therapy Facilty/Provider(s): (none reported) Reason for Treatment: (depression)  Prior Outpatient Therapy Prior Outpatient Therapy: No Does patient have an ACCT team?: No Does patient have Intensive In-House Services?  : No Does patient have [facility] services? : No Does patient have P4CC services?: No  ADL Screening (condition at time of admission) Patient's cognitive ability adequate to safely complete daily activities?: Yes Patient able to express need for assistance with ADLs?: Yes Independently performs ADLs?: Yes (appropriate for developmental age)  Disposition:  Disposition Initial Assessment Completed for this Encounter: Yes  Sherron Flemings, NP, recommends overnight observation for safety and stabilization with psych reassessment in the AM.  On Site Evaluation by:   Reviewed with Physician:    Burnetta Sabin 10/19/2019 3:22 AM

## 2019-10-20 DIAGNOSIS — F331 Major depressive disorder, recurrent, moderate: Secondary | ICD-10-CM

## 2019-10-20 MED ORDER — MEMANTINE HCL 5 MG PO TABS
5.0000 mg | ORAL_TABLET | Freq: Two times a day (BID) | ORAL | Status: DC
  Administered 2019-10-20 – 2019-10-21 (×2): 5 mg via ORAL
  Filled 2019-10-20 (×4): qty 1

## 2019-10-20 MED ORDER — LISINOPRIL 20 MG PO TABS
20.0000 mg | ORAL_TABLET | Freq: Every day | ORAL | Status: DC
  Administered 2019-10-20 – 2019-10-24 (×5): 20 mg via ORAL
  Filled 2019-10-20 (×6): qty 1

## 2019-10-20 MED ORDER — DONEPEZIL HCL 10 MG PO TABS
10.0000 mg | ORAL_TABLET | Freq: Every day | ORAL | Status: DC
  Administered 2019-10-20 – 2019-10-22 (×3): 10 mg via ORAL
  Filled 2019-10-20 (×5): qty 1

## 2019-10-20 MED ORDER — ALBUTEROL SULFATE HFA 108 (90 BASE) MCG/ACT IN AERS
2.0000 | INHALATION_SPRAY | Freq: Four times a day (QID) | RESPIRATORY_TRACT | Status: DC | PRN
  Administered 2019-10-23 – 2019-10-24 (×2): 2 via RESPIRATORY_TRACT
  Filled 2019-10-20: qty 6.7

## 2019-10-20 MED ORDER — PRENATAL MULTIVITAMIN CH
1.0000 | ORAL_TABLET | Freq: Every day | ORAL | Status: DC
  Administered 2019-10-20 – 2019-10-24 (×5): 1 via ORAL
  Filled 2019-10-20 (×6): qty 1

## 2019-10-20 MED ORDER — PERPHENAZINE 2 MG PO TABS
2.0000 mg | ORAL_TABLET | Freq: Two times a day (BID) | ORAL | Status: DC
  Administered 2019-10-20 – 2019-10-21 (×2): 2 mg via ORAL
  Filled 2019-10-20 (×4): qty 1

## 2019-10-20 MED ORDER — PANTOPRAZOLE SODIUM 20 MG PO TBEC
20.0000 mg | DELAYED_RELEASE_TABLET | Freq: Every day | ORAL | Status: DC
  Administered 2019-10-20 – 2019-10-25 (×6): 20 mg via ORAL
  Filled 2019-10-20 (×7): qty 1

## 2019-10-20 MED ORDER — UMECLIDINIUM-VILANTEROL 62.5-25 MCG/INH IN AEPB
1.0000 | INHALATION_SPRAY | Freq: Every day | RESPIRATORY_TRACT | Status: DC
  Administered 2019-10-20 – 2019-10-25 (×6): 1 via RESPIRATORY_TRACT
  Filled 2019-10-20: qty 14

## 2019-10-20 MED ORDER — TEMAZEPAM 15 MG PO CAPS
15.0000 mg | ORAL_CAPSULE | Freq: Every day | ORAL | Status: DC
  Administered 2019-10-20 – 2019-10-24 (×5): 15 mg via ORAL
  Filled 2019-10-20 (×5): qty 1

## 2019-10-20 NOTE — Progress Notes (Signed)
Recreation Therapy Notes  Date: 2.24.21 Time: 0950 Location: 500 Hall Dayroom  Group Topic: Wellness  Goal Area(s) Addresses:  Patient will define components of whole wellness. Patient will verbalize benefit of whole wellness.  Intervention: Music  Activity:  Exercise.  LRT led the group in a series of stretches before going into the exercises.  Once patients completed the stretches, each person was given the chace to lead the group in an exercise of their choice.  LRT and patients were to complete at least 30 minutes of exercise.  Education: Wellness, Building control surveyor.   Education Outcome: Acknowledges education/In group clarification offered/Needs additional education.   Clinical Observations/Feedback:  Pt did not attend group.    Caroll Rancher, LRT/CTRS    Lillia Abed, Sameen Leas A 10/20/2019 12:00 PM

## 2019-10-20 NOTE — Progress Notes (Signed)
[facility] MD Progress Note  10/20/2019 1:44 PM Michaela Wells  MRN:  [id number] Subjective:   Patient is a 59 year old recovering alcoholic who suffering from auditory and visual hallucinations at home, multiple delusional believes focused on neighbors to include even thought insertion type delusions, and increased paranoia his daughter reported she had been deteriorating over the last few weeks.  The patient is seen individually and continues to insist her neighbors are using "microphones" with these activities and she does not except that they are hallucinatory further she states in rounds her goal is to stop this activity as well as in treatment team. Principal Problem: Psychosis/history of alcohol dependency Diagnosis: Active Problems:   MDD (major depressive disorder)  Total Time spent with patient: 20 minutes  Past Psychiatric History: History of alcohol dependency  Past Medical History:  Past Medical History:  Diagnosis Date  . Bronchial asthma   . Chicken pox   . COPD (chronic obstructive pulmonary disease) ([place])   . Depression   . Frequent headaches   . GERD (gastroesophageal reflux disease)   . UTI (lower urinary tract infection)     Past Surgical History:  Procedure Laterality Date  . ESOPHAGEAL DILATION    . TONSILLECTOMY     Family History:  Family History  Problem Relation Age of Onset  . Other Mother        MVA-Deceased [pt was age 54]  . COPD Father        Deceased  . Heart attack Maternal Grandmother   . Stroke Maternal Grandmother   . Brain cancer Maternal Grandmother   . Arthritis/Rheumatoid Maternal Grandmother   . Heart attack Maternal Grandfather   . Stroke Maternal Grandfather   . Heart disease Maternal Aunt   . Stroke Maternal Aunt        #1  . Heart attack Maternal Aunt        #1  . Hypertension Maternal Aunt        #1  . Arthritis/Rheumatoid Sister   . COPD Sister   . Heart disease Maternal Uncle   . Heart attack Maternal Uncle        x4  .  Heart disease Brother        #1  . Heart attack Brother        #1  . Diabetes Brother   . Diabetes Maternal Aunt   . Mental retardation Maternal Aunt   . Healthy Daughter        x2  . Drug abuse Daughter        #1  . Drug abuse Daughter        #2  . Kidney Stones Daughter        #2  . Other Son        Agoraphobia   Family Psychiatric  History: Denied Social History:  Social History   Substance and Sexual Activity  Alcohol Use Yes     Social History   Substance and Sexual Activity  Drug Use Not Currently    Social History   Socioeconomic History  . Marital status: Single    Spouse name: Not on file  . Number of children: Not on file  . Years of education: Not on file  . Highest education level: Not on file  Occupational History  . Not on file  Tobacco Use  . Smoking status: Current Every Day Smoker  . Smokeless tobacco: Never Used  Substance and Sexual Activity  . Alcohol use: Yes  .  Drug use: Not Currently  . Sexual activity: Not Currently  Other Topics Concern  . Not on file  Social History Narrative  . Not on file   Social Determinants of Health   Financial Resource Strain:   . Difficulty of Paying Living Expenses: Not on file  Food Insecurity:   . Worried About Programme researcher, broadcasting/film/video in the Last Year: Not on file  . Ran Out of Food in the Last Year: Not on file  Transportation Needs:   . Lack of Transportation (Medical): Not on file  . Lack of Transportation (Non-Medical): Not on file  Physical Activity:   . Days of Exercise per Week: Not on file  . Minutes of Exercise per Session: Not on file  Stress:   . Feeling of Stress : Not on file  Social Connections:   . Frequency of Communication with Friends and Family: Not on file  . Frequency of Social Gatherings with Friends and Family: Not on file  . Attends Religious Services: Not on file  . Active Member of Clubs or Organizations: Not on file  . Attends Banker Meetings: Not on file   . Marital Status: Not on file   Additional Social History:    Pain Medications: see MAR Prescriptions: see MAR Over the Counter: see MAR                    Sleep: Good  Appetite:  Good  Current Medications: Current Facility-Administered Medications  Medication Dose Route Frequency Provider Last Rate Last Admin  . acetaminophen (TYLENOL) tablet 650 mg  650 mg Oral Q6H PRN Jearld Lesch, NP   650 mg at 10/20/19 0554  . alum & mag hydroxide-simeth (MAALOX/MYLANTA) 200-200-20 MG/5ML suspension 30 mL  30 mL Oral Q4H PRN Dixon, Rashaun M, NP      . hydrOXYzine (ATARAX/VISTARIL) tablet 25 mg  25 mg Oral TID PRN Dixon, Elray Buba, NP      . magnesium hydroxide (MILK OF MAGNESIA) suspension 30 mL  30 mL Oral Daily PRN Dixon, Rashaun M, NP      . QUEtiapine (SEROQUEL) tablet 50 mg  50 mg Oral TID Malvin Johns, MD   50 mg at 10/20/19 1211  . traZODone (DESYREL) tablet 50 mg  50 mg Oral QHS PRN Jearld Lesch, NP        Lab Results:  Results for orders placed or performed during the hospital encounter of 10/19/19 (from the past 48 hour(s))  Respiratory Panel by RT PCR (Flu A&B, Covid) - Nasopharyngeal Swab     Status: None   Collection Time: 10/19/19  2:24 AM   Specimen: Nasopharyngeal Swab  Result Value Ref Range   SARS Coronavirus 2 by RT PCR NEGATIVE NEGATIVE    Comment: (NOTE) SARS-CoV-2 target nucleic acids are NOT DETECTED. The SARS-CoV-2 RNA is generally detectable in upper respiratoy specimens during the acute phase of infection. The lowest concentration of SARS-CoV-2 viral copies this assay can detect is 131 copies/mL. A negative result does not preclude SARS-Cov-2 infection and should not be used as the sole basis for treatment or other patient management decisions. A negative result may occur with  improper specimen collection/handling, submission of specimen other than nasopharyngeal swab, presence of viral mutation(s) within the areas targeted by this assay,  and inadequate number of viral copies (<131 copies/mL). A negative result must be combined with clinical observations, patient history, and epidemiological information. The expected result is Negative. Fact Sheet for Patients:  https://www.moore.com/ Fact Sheet for Healthcare Providers:  https://www.young.biz/ This test is not yet ap proved or cleared by the [place] FDA and  has been authorized for detection and/or diagnosis of SARS-CoV-2 by FDA under an Emergency Use Authorization (EUA). This EUA will remain  in effect (meaning this test can be used) for the duration of the COVID-19 declaration under Section 564(b)(1) of the Act, 21 U.S.C. section 360bbb-3(b)(1), unless the authorization is terminated or revoked sooner.    Influenza A by PCR NEGATIVE NEGATIVE   Influenza B by PCR NEGATIVE NEGATIVE    Comment: (NOTE) The Xpert Xpress SARS-CoV-2/FLU/RSV assay is intended as an aid in  the diagnosis of influenza from Nasopharyngeal swab specimens and  should not be used as a sole basis for treatment. Nasal washings and  aspirates are unacceptable for Xpert Xpress SARS-CoV-2/FLU/RSV  testing. Fact Sheet for Patients: https://www.moore.com/ Fact Sheet for Healthcare Providers: https://www.young.biz/ This test is not yet approved or cleared by the [place] FDA and  has been authorized for detection and/or diagnosis of SARS-CoV-2 by  FDA under an Emergency Use Authorization (EUA). This EUA will remain  in effect (meaning this test can be used) for the duration of the  Covid-19 declaration under Section 564(b)(1) of the Act, 21  U.S.C. section 360bbb-3(b)(1), unless the authorization is  terminated or revoked. Performed at [facility], [address], [place], [place] [address]     Blood Alcohol level:  Lab Results  Component Value Date   ETH <10 05/05/2018   ETH <10  01/04/2018    Metabolic Disorder Labs: Lab Results  Component Value Date   HGBA1C 6.7 (H) 05/06/2018   MPG 145.59 05/06/2018   No results found for: PROLACTIN Lab Results  Component Value Date   CHOL 203 (H) 05/06/2018   TRIG 73 05/06/2018   HDL 51 05/06/2018   CHOLHDL 4.0 05/06/2018   VLDL 15 05/06/2018   LDLCALC 137 (H) 05/06/2018    Physical Findings: AIMS: Facial and Oral Movements Muscles of Facial Expression: None, normal Lips and Perioral Area: None, normal Jaw: None, normal Tongue: None, normal,Extremity Movements Upper (arms, wrists, hands, fingers): None, normal Lower (legs, knees, ankles, toes): None, normal, Trunk Movements Neck, shoulders, hips: None, normal, Overall Severity Severity of abnormal movements (highest score from questions above): None, normal Incapacitation due to abnormal movements: None, normal Patient's awareness of abnormal movements (rate only patient's report): No Awareness, Dental Status Current problems with teeth and/or dentures?: No Does patient usually wear dentures?: No  CIWA:  CIWA-Ar Total: 0 COWS:  COWS Total Score: 1  Musculoskeletal: Strength & Muscle Tone: within normal limits Gait & Station: normal Patient leans: N/A  Psychiatric Specialty Exam: Physical Exam  Review of Systems  Blood pressure (!) 147/92, pulse 88, temperature (!) 97.5 F (36.4 C), temperature source Oral, resp. rate 18, SpO2 100 %.There is no height or weight on file to calculate BMI.  General Appearance: Casual  Eye Contact:  Fair  Speech:  Clear and Coherent  Volume:  Normal  Mood:  Anxious and Dysphoric  Affect:  Congruent  Thought Process:  Irrelevant and Descriptions of Associations: Circumstantial  Orientation:  Full (Time, Place, and Person)  Thought Content:  Illogical, Delusions and Hallucinations: Auditory  Suicidal Thoughts:  No  Homicidal Thoughts:  No  Memory:  Immediate;   Fair Recent;   Fair Remote;   Fair 3 of 3/2 thus improved  from yesterday  Judgement:  Fair  Insight:  Fair  Psychomotor Activity:  Normal  Concentration:  Concentration: Fair and Attention Span: Fair  Recall:  [employer] of Knowledge:  Fair  Language:  Fair  Akathisia:  Negative  Handed:  Right  AIMS (if indicated):     Assets:  Physical Health Resilience  ADL's:  Intact  Cognition:  WNL  Sleep:  Number of Hours: 6.5     Treatment Plan Summary: Daily contact with patient to assess and evaluate symptoms and progress in treatment and Medication management  Psychosis begin low-dose perphenazine For mild cognitive impairment rule out alcoholic dementia and Namenda Aricept and vitamins Add Sleep Aid monitor for safety  Malvin Johns, MD 10/20/2019, 1:44 PM

## 2019-10-20 NOTE — Plan of Care (Addendum)
Progress note  D: pt found in bed; compliant with medication administration. . Pt is pleasant. Pt states they are continuing to have issues sleeping. Pt denies si/hi/vh and verbally agrees to approach staff if these become apparent or before harming themself/others while at [facility]. Pt does state they are having voices. "There are about 4". Pt denies any physical complaints but does have back pain they rate at a 2/10. Pt declined medication. A: Pt provided support and encouragement. Pt given medication per protocol and standing orders. Q29m safety checks implemented and continued.  R: Pt safe on the unit. Will continue to monitor.  Pt progressing in the following metrics  Problem: Coping: Goal: Ability to identify and develop effective coping behavior will improve Outcome: Progressing   Problem: Activity: Goal: Interest or engagement in leisure activities will improve Outcome: Progressing Goal: Imbalance in normal sleep/wake cycle will improve Outcome: Progressing

## 2019-10-20 NOTE — Progress Notes (Signed)
   10/20/19 0555  Psych Admission Type (Psych Patients Only)  Admission Status Voluntary  Psychosocial Assessment  Patient Complaints Anxiety  Eye Contact Fair  Facial Expression Anxious  Affect Appropriate to circumstance  Speech Logical/coherent  Interaction Assertive  Motor Activity Restless  Appearance/Hygiene Unremarkable  Behavior Characteristics Cooperative  Mood Pleasant  Aggressive Behavior  Effect No apparent injury  Thought Process  Coherency WDL  Content Preoccupation  Delusions None reported or observed  Perception Hallucinations;Derealization  Hallucination Auditory  Judgment Impaired  Confusion None  Danger to Self  Current suicidal ideation? Denies  Danger to Others  Danger to Others None reported or observed   Pt seen in the dayroom this morning. Pt endorses AH that are not commanding, but "doing things to me. That's why I'm here." Pt said she did not sleep well. Used to having Seroquel at night for sleep. Endorses pain 4/10 in her back. Given Tylenol 650 mg.

## 2019-10-20 NOTE — Progress Notes (Signed)
[facility] LCSW Group Therapy Note  Date/Time: 2/24 @ 1:30pm   Type of Therapy and Topic:  Group Therapy:  Overcoming Obstacles  Participation Level:  [facility] PARTICIPATION LEVEL: Did Not Attend  Description of Group:    In this group patients will be encouraged to explore what they see as obstacles to their own wellness and recovery. They will be guided to discuss their thoughts, feelings, and behaviors related to these obstacles. The group will process together ways to cope with barriers, with attention given to specific choices patients can make. Each patient will be challenged to identify changes they are motivated to make in order to overcome their obstacles. This group will be process-oriented, with patients participating in exploration of their own experiences as well as giving and receiving support and challenge from other group members.  Therapeutic Goals: 1. Patient will identify personal and current obstacles as they relate to admission. 2. Patient will identify barriers that currently interfere with their wellness or overcoming obstacles.  3. Patient will identify feelings, thought process and behaviors related to these barriers. 4. Patient will identify two changes they are willing to make to overcome these obstacles:    Summary of Patient Progress  Pt did not attend group due to acuity in the hall and being asleep.     Therapeutic Modalities:   Cognitive Behavioral Therapy Solution Focused Therapy Motivational Interviewing Relapse Prevention Therapy   Kade Rickels, MSW intern   

## 2019-10-20 NOTE — Tx Team (Signed)
Interdisciplinary Treatment and Diagnostic Plan Update  10/20/2019 Time of Session: 9:20am  Michaela Wells MRN: [id number]  Principal Diagnosis: <principal problem not specified>  Secondary Diagnoses: Active Problems:   MDD (major depressive disorder)   Current Medications:  Current Facility-Administered Medications  Medication Dose Route Frequency Provider Last Rate Last Admin  . acetaminophen (TYLENOL) tablet 650 mg  650 mg Oral Q6H PRN Jearld Lesch, NP   650 mg at 10/20/19 0554  . alum & mag hydroxide-simeth (MAALOX/MYLANTA) 200-200-20 MG/5ML suspension 30 mL  30 mL Oral Q4H PRN Dixon, Rashaun M, NP      . hydrOXYzine (ATARAX/VISTARIL) tablet 25 mg  25 mg Oral TID PRN Dixon, Elray Buba, NP      . magnesium hydroxide (MILK OF MAGNESIA) suspension 30 mL  30 mL Oral Daily PRN Dixon, Rashaun M, NP      . QUEtiapine (SEROQUEL) tablet 50 mg  50 mg Oral TID Malvin Johns, MD   50 mg at 10/20/19 0736  . traZODone (DESYREL) tablet 50 mg  50 mg Oral QHS PRN Jearld Lesch, NP       PTA Medications: Medications Prior to Admission  Medication Sig Dispense Refill Last Dose  . fluticasone (FLONASE) 50 MCG/ACT nasal spray Place 2 sprays into both nostrils daily.     Marland Kitchen lisinopril (ZESTRIL) 20 MG tablet Take 20 mg by mouth daily.     . pantoprazole (PROTONIX) 20 MG tablet Take 20 mg by mouth daily.     . QUEtiapine (SEROQUEL) 100 MG tablet Take 100 mg by mouth at bedtime.     Marland Kitchen albuterol (PROVENTIL HFA;VENTOLIN HFA) 108 (90 BASE) MCG/ACT inhaler Inhale 1 puff into the lungs every 4 (four) hours as needed for wheezing or shortness of breath. Ventolin (Patient not taking: Reported on 05/05/2018) 1 Inhaler 2 Not Taking at Unknown time  . divalproex (DEPAKOTE) 250 MG DR tablet Take 3 tablets (750 mg total) by mouth at bedtime. (Patient not taking: Reported on 10/19/2019) 90 tablet 0 Not Taking at Unknown time  . hydrOXYzine (ATARAX/VISTARIL) 50 MG tablet Take 1 tablet (50 mg total) by mouth every 6  (six) hours as needed for anxiety. (Patient not taking: Reported on 10/19/2019) 30 tablet 0 Not Taking at Unknown time  . nicotine (NICODERM CQ - DOSED IN MG/24 HOURS) 21 mg/24hr patch Place 1 patch (21 mg total) onto the skin daily. (Patient not taking: Reported on 10/19/2019) 28 patch 0 Not Taking at Unknown time  . OLANZapine (ZYPREXA) 10 MG tablet Take 1 tablet (10 mg total) by mouth at bedtime. (Patient not taking: Reported on 10/19/2019) 30 tablet 0 Not Taking at Unknown time  . traZODone (DESYREL) 50 MG tablet Take 1 tablet (50 mg total) by mouth at bedtime. (Patient not taking: Reported on 10/19/2019) 15 tablet 0 Not Taking at Unknown time  . Umeclidinium-Vilanterol (ANORO ELLIPTA) 62.5-25 MCG/INH AEPB Inhale 1 puff into the lungs daily. (Patient not taking: Reported on 05/05/2018) 30 each 11 Not Taking at Unknown time    Patient Stressors:    Patient Strengths:    Treatment Modalities: Medication Management, Group therapy, Case management,  1 to 1 session with clinician, Psychoeducation, Recreational therapy.   Physician Treatment Plan for Primary Diagnosis: <principal problem not specified> Long Term Goal(s):     Short Term Goals:    Medication Management: Evaluate patient's response, side effects, and tolerance of medication regimen.  Therapeutic Interventions: 1 to 1 sessions, Unit Group sessions and Medication administration.  Evaluation of  Outcomes: Progressing  Physician Treatment Plan for Secondary Diagnosis: Active Problems:   MDD (major depressive disorder)  Long Term Goal(s):     Short Term Goals:       Medication Management: Evaluate patient's response, side effects, and tolerance of medication regimen.  Therapeutic Interventions: 1 to 1 sessions, Unit Group sessions and Medication administration.  Evaluation of Outcomes: Progressing   RN Treatment Plan for Primary Diagnosis: <principal problem not specified> Long Term Goal(s): Knowledge of disease and  therapeutic regimen to maintain health will improve  Short Term Goals: Ability to participate in decision making will improve, Ability to verbalize feelings will improve, Ability to disclose and discuss suicidal ideas, Ability to identify and develop effective coping behaviors will improve and Compliance with prescribed medications will improve  Medication Management: RN will administer medications as ordered by provider, will assess and evaluate patient's response and provide education to patient for prescribed medication. RN will report any adverse and/or side effects to prescribing provider.  Therapeutic Interventions: 1 on 1 counseling sessions, Psychoeducation, Medication administration, Evaluate responses to treatment, Monitor vital signs and CBGs as ordered, Perform/monitor CIWA, COWS, AIMS and Fall Risk screenings as ordered, Perform wound care treatments as ordered.  Evaluation of Outcomes: Progressing   LCSW Treatment Plan for Primary Diagnosis: <principal problem not specified> Long Term Goal(s): Safe transition to appropriate next level of care at discharge, Engage patient in therapeutic group addressing interpersonal concerns.  Short Term Goals: Engage patient in aftercare planning with referrals and resources and Increase skills for wellness and recovery  Therapeutic Interventions: Assess for all discharge needs, 1 to 1 time with Social worker, Explore available resources and support systems, Assess for adequacy in community support network, Educate family and significant other(s) on suicide prevention, Complete Psychosocial Assessment, Interpersonal group therapy.  Evaluation of Outcomes: Progressing   Progress in Treatment: Attending groups: No. Participating in groups: No. Taking medication as prescribed: Yes. Toleration medication: Yes. Family/Significant other contact made: No, will contact:  if given consent  Patient understands diagnosis: Yes. Discussing patient  identified problems/goals with staff: Yes. Medical problems stabilized or resolved: Yes. Denies suicidal/homicidal ideation: Yes. Issues/concerns per patient self-inventory: No. Other:   New problem(s) identified: No, Describe:  none  New Short Term/Long Term Goal(s): Medication stabilization, elimination of SI thoughts, and development of a comprehensive mental wellness plan.   Patient Goals:  "Get rid of my 4 voices in my head"  Discharge Plan or Barriers: CSW will continue to follow up for appropriate referrals and possible discharge planning  Reason for Continuation of Hospitalization: Anxiety Delusions  Hallucinations Medication stabilization  Estimated Length of Stay: 3-5 days   Attendees: Patient: Michaela Wells 10/20/2019   Physician: Dr. Jake Samples, MD 10/20/2019   Nursing: Legrand Como, RN 10/20/2019   RN Care Manager: 10/20/2019   Social Worker: Ardelle Anton, [place] 10/20/2019   Recreational Therapist:  10/20/2019   Other: Ovidio Kin, MSW intern  10/20/2019   Other:  10/20/2019   Other: 10/20/2019     Scribe for Treatment Team: Billey Chang, Student-Social Work 10/20/2019 10:06 AM
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
## 2019-10-20 NOTE — [facility] Suicide Risk Assessment (Cosign Needed)
[facility] INPATIENT:  Family/Significant Other Suicide Prevention Education  Suicide Prevention Education:  Education Completed; with daughter, Michaela Wells, [phone] has been identified by the patient as the family member/significant other with whom the patient will be residing, and identified as the person(s) who will aid the patient in the event of a mental health crisis (suicidal ideations/suicide attempt).  With written consent from the patient, the family member/significant other has been provided the following suicide prevention education, prior to the and/or following the discharge of the patient.  The suicide prevention education provided includes the following:  Suicide risk factors  Suicide prevention and interventions  National Suicide Hotline telephone number  [facility] assessment telephone number  [facility] Emergency Assistance 911  [facility] and/or Residential Mobile Crisis Unit telephone number  Request made of family/significant other to:  Remove weapons (e.g., guns, rifles, knives), all items previously/currently identified as safety concern.    Remove drugs/medications (over-the-counter, prescriptions, illicit drugs), all items previously/currently identified as a safety concern.  The family member/significant other verbalizes understanding of the suicide prevention education information provided.  The family member/significant other agrees to remove the items of safety concern listed above.   Pt's daughter had questions about pt's diagnosis and also stated that they would like to get an MRI done to see if there could be something in her brain. Pt's daughter mentioned that they will need to know the price but are willing to pay if insurance doesn't cover.    Michaela Wells 10/20/2019, 3:25 PM

## 2019-10-20 NOTE — Progress Notes (Signed)
Recreation Therapy Notes  INPATIENT RECREATION THERAPY ASSESSMENT  Patient Details Name: Michaela Wells MRN: [id number] DOB: 11-06-1960 Today's Date: 10/20/2019       Information Obtained From: Patient  Able to Participate in Assessment/Interview: Yes  Patient Presentation: Alert  Reason for Admission (Per Patient): Other (Comments)(Hearing voices)  Patient Stressors: Death(Recent death of dog)  Coping Skills:   Isolation, Journal, Arguments, Music, Exercise, Deep Breathing, Talk, Write, Prayer, Avoidance, Read  Leisure Interests (2+):  Individual - Writing, Social - Social Media, Individual - Other (Comment)(Watch Youtube, play games on phone)  Frequency of Recreation/Participation: Other (Comment)  Awareness of Community Resources:  Yes  Community Resources:  Library, Other (Comment)(Ball field)  Current Use: No  If no, Barriers?: Other (Comment)(Pt was unable to explain why)  Expressed Interest in [employer] Information: No  [place] of Residence:  [place]  Patient Main Form of Transportation: Walk  Patient Strengths:  Keeping self alive  Patient Identified Areas of Improvement:  "Everything"  Patient Goal for Hospitalization:  "find out what makes me tick like that and not tick right"  Current SI (including self-harm):  No  Current HI:  No  Current AVH: Yes(Hearing voices -"talking about what we are talking about")  Staff Intervention Plan: Group Attendance, Collaborate with Interdisciplinary Treatment Team  Consent to Intern Participation: N/A   Caroll Rancher, LRT/CTRS  Lillia Abed, Ricci Dirocco A 10/20/2019, 1:37 PM

## 2019-10-20 NOTE — Progress Notes (Signed)
   10/20/19 2100  Psych Admission Type (Psych Patients Only)  Admission Status Voluntary  Psychosocial Assessment  Patient Complaints Anxiety;Depression  Eye Contact Fair  Facial Expression Anxious;Pensive;Worried  Affect Anxious;Appropriate to circumstance;Preoccupied  Speech Logical/coherent  Interaction Assertive  Motor Activity Fidgety;Restless  Appearance/Hygiene Unremarkable  Behavior Characteristics Cooperative;Anxious  Mood Depressed  Thought Process  Coherency WDL  Content WDL  Delusions None reported or observed  Perception Hallucinations  Hallucination Auditory  Judgment Poor  Confusion None  Danger to Self  Current suicidal ideation? Denies  Danger to Others  Danger to Others None reported or observed

## 2019-10-21 ENCOUNTER — Inpatient Hospital Stay (HOSPITAL_COMMUNITY): Payer: Medicaid Other

## 2019-10-21 LAB — POCT I-STAT CREATININE: Creatinine, Ser: 0.9 mg/dL (ref 0.44–1.00)

## 2019-10-21 IMAGING — MR MR HEAD WO/W CM
14 series · 48 of 48 positions shown · IV contrast (gadavist)
Comparison: Noncontrast head CT [DATE]

CLINICAL DATA: Psychosis. Additional history provided: Patient
reports worsening anxiety and delusions

EXAM:
MRI HEAD WITHOUT AND WITH CONTRAST
TECHNIQUE: Multiplanar, multiecho pulse sequences of the brain and surrounding
structures were obtained without and with intravenous contrast.
CONTRAST:  6.5mL GADAVIST GADOBUTROL 1 MMOL/ML IV SOLN

[Series 5: T1 · sagittal · 5.0mm · 0.75mm/px · 1 of 24 slices shown (1 of 2)]
[im 1/24]
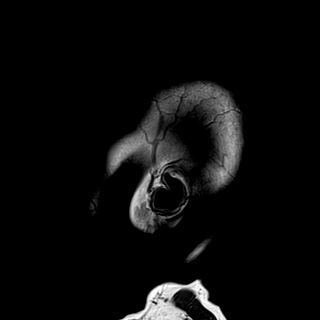

[Series 10: T2 · axial · 5.0mm · 0.62mm/px · 1 of 24 slices shown]
[im 1/24]
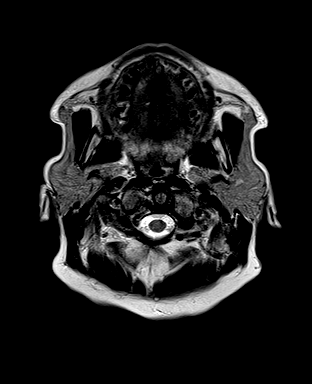

[Series 11: DWI · axial · 3.0mm · 1.36mm/px · z∈[-87,+60]mm · 6 of 100 slices shown (1 of 4)]
[im 1/100]
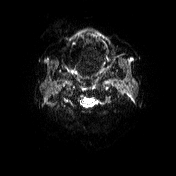
[im 20/100]
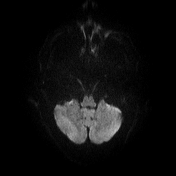
[im 40/100]
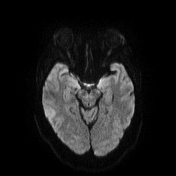
[im 60/100]
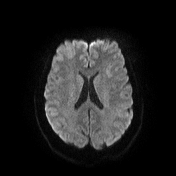
[im 80/100]
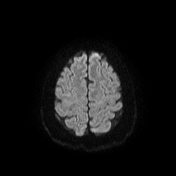
[im 100/100]
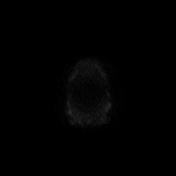

[Series 12: DWI · axial · 3.0mm · 1.36mm/px · z∈[-87,+60]mm · 3 of 50 slices shown (2 of 4)]
[im 1/50]
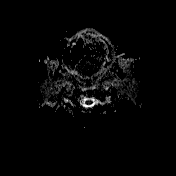
[im 25/50]
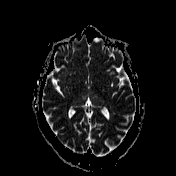
[im 50/50]
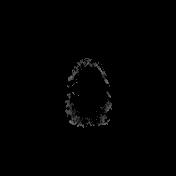

[Series 13: mip_images(sw) · axial · 24.0mm · 0.75mm/px · z∈[-109,+83]mm · 4 of 65 slices shown]
[im 1/65]
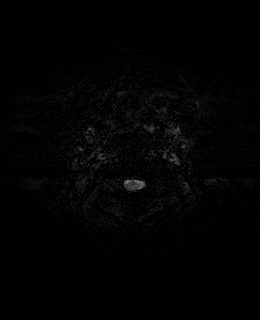
[im 22/65]
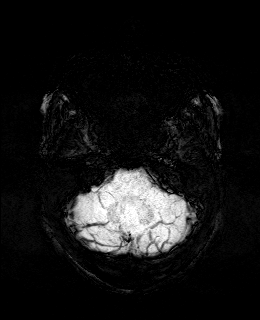
[im 43/65]
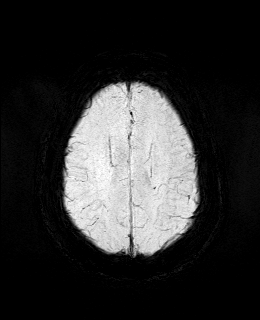
[im 65/65]
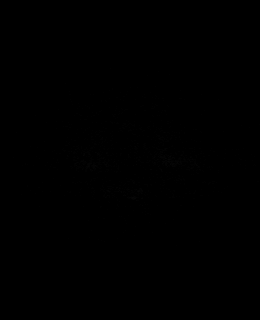

[Series 14: swi_images · axial · 3.0mm · 0.75mm/px · z∈[-120,+93]mm · 4 of 72 slices shown]
[im 1/72]
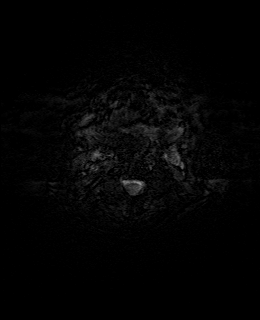
[im 24/72]
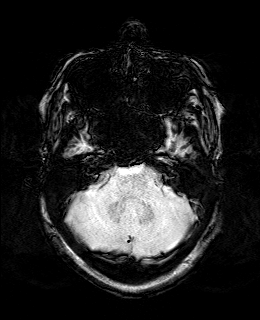
[im 48/72]
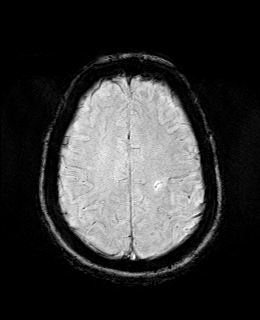
[im 72/72]
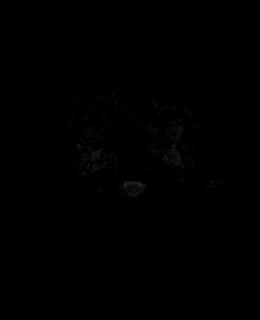

[Series 15: FLAIR · axial · 3.0mm · 0.75mm/px · z∈[-73,+47]mm · 2 of 41 slices shown]
[im 1/41]
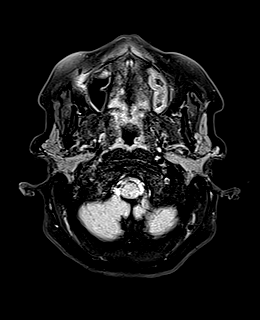
[im 41/41]
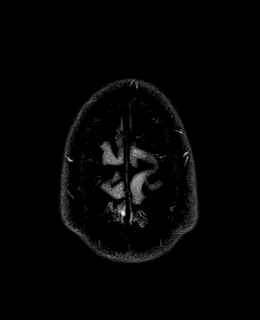

[Series 16: T1 · axial · 1.0mm · 0.94mm/px · z∈[-90,+53]mm · 8 of 144 slices shown (2 of 2)]
[im 1/144]
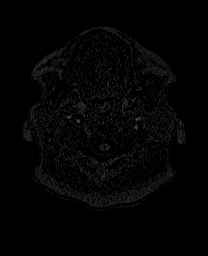
[im 21/144]
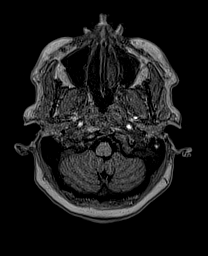
[im 41/144]
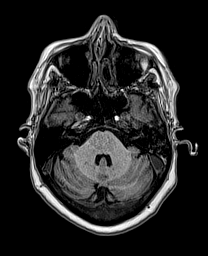
[im 62/144]
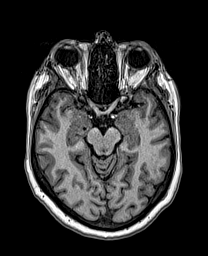
[im 82/144]
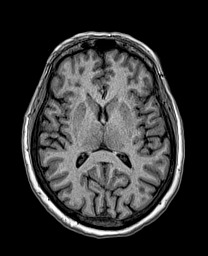
[im 103/144]
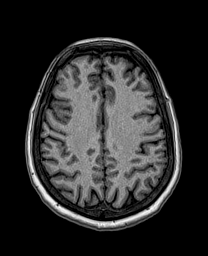
[im 123/144]
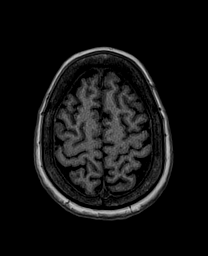
[im 144/144]
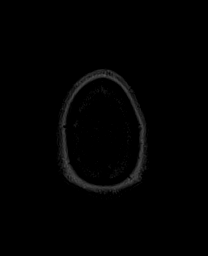

[Series 17: DWI · coronal · 5.0mm · 1.31mm/px · 4 of 64 slices shown (3 of 4)]
[im 1/64]
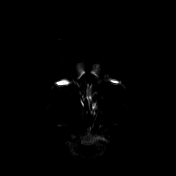
[im 22/64]
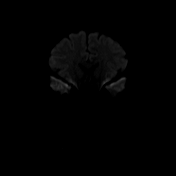
[im 43/64]
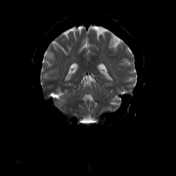
[im 64/64]
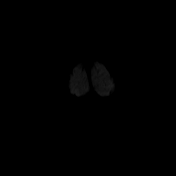

[Series 18: DWI · coronal · 5.0mm · 1.31mm/px · 2 of 32 slices shown (4 of 4)]
[im 1/32]
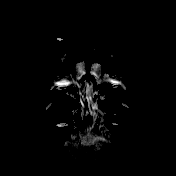
[im 32/32]
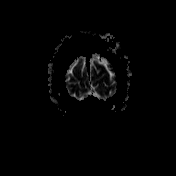

[Series 19: T2 post-contrast · coronal · 5.0mm · 0.57mm/px · 2 of 32 slices shown]
[im 1/32]
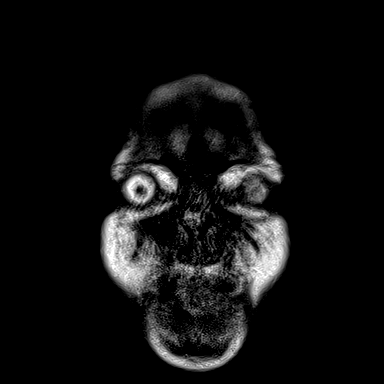
[im 32/32]
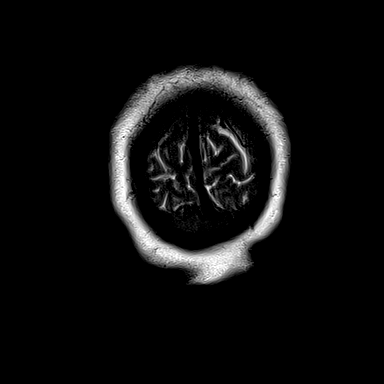

[Series 20: T1 post-contrast · axial · 1.0mm · 0.94mm/px · z∈[-83,+60]mm · 8 of 144 slices shown (1 of 3)]
[im 1/144]
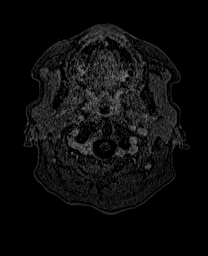
[im 21/144]
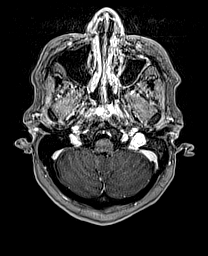
[im 41/144]
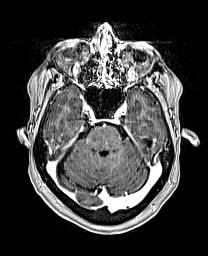
[im 62/144]
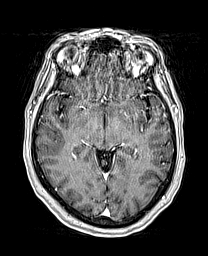
[im 82/144]
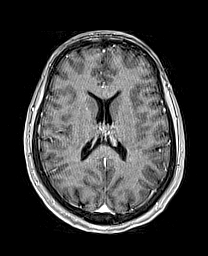
[im 103/144]
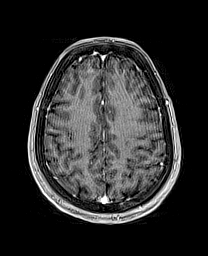
[im 123/144]
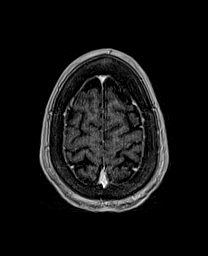
[im 144/144]
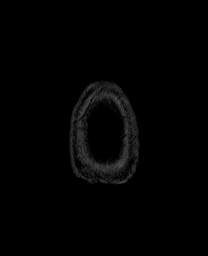

[Series 21: T1 post-contrast · coronal · 5.0mm · 0.43mm/px · 2 of 32 slices shown (2 of 3)]
[im 1/32]
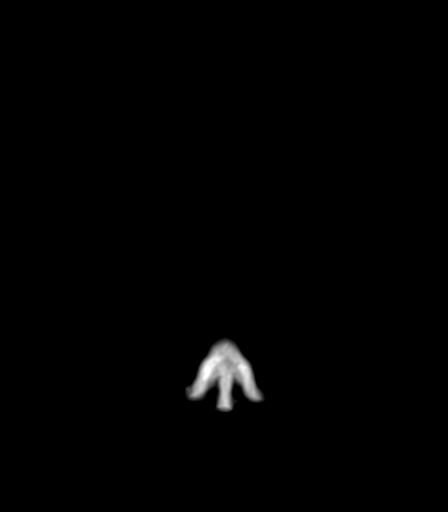
[im 32/32]
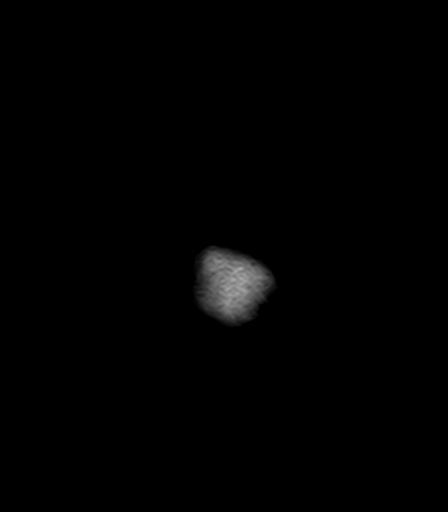

[Series 22: T1 post-contrast · sagittal · 5.0mm · 0.75mm/px · 1 of 24 slices shown (3 of 3)]
[im 1/24]
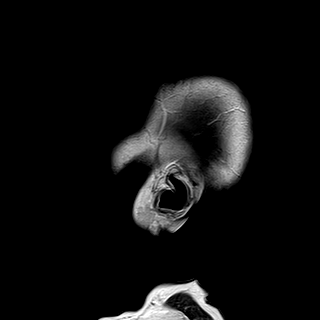

[48 of 48 positions shown; findings below may reference images not displayed]

FINDINGS: Brain:

The examination is motion degraded. Most notably there is moderate
motion degradation of the axial T2 weighted sequence,
moderate/severe motion degradation of the coronal T2 weighted
sequence and mild-to-moderate motion degradation of the T1 weighted
postcontrast imaging.

There is no evidence of acute infarct.

No evidence of intracranial mass.

No midline shift or extra-axial fluid collection.

No chronic intracranial blood products.

Scattered T2/FLAIR hyperintensity within the cerebral white matter
and pons.

Cerebral volume is normal for age.

Within limitations of motion degradation, no abnormal intracranial
enhancement is identified.

Vascular: Flow voids maintained within the proximal large arterial
vessels.

Skull and upper cervical spine: No focal marrow lesion.

Sinuses/Orbits: Visualized orbits demonstrate no acute abnormality.
Mild paranasal sinus mucosal thickening greatest within the left
maxillary sinus. Left mastoid effusion.
IMPRESSION: 1. Motion degraded examination as described.
2. No evidence of acute intracranial abnormality.
3. Scattered T2 hyperintense signal changes within the cerebral
white matter and pons. Findings are advanced for age and
nonspecific, but most commonly seen on the basis of chronic small
vessel ischemic disease.
4. Mild paranasal sinus mucosal thickening.
5. Left mastoid effusion.

## 2019-10-21 MED ORDER — MEMANTINE HCL 10 MG PO TABS
10.0000 mg | ORAL_TABLET | Freq: Two times a day (BID) | ORAL | Status: DC
  Administered 2019-10-21 – 2019-10-23 (×4): 10 mg via ORAL
  Filled 2019-10-21 (×10): qty 1

## 2019-10-21 MED ORDER — GADOBUTROL 1 MMOL/ML IV SOLN
8.0000 mL | Freq: Once | INTRAVENOUS | Status: AC | PRN
  Administered 2019-10-21: 14:00:00 6.5 mL via INTRAVENOUS
  Filled 2019-10-21: qty 8

## 2019-10-21 MED ORDER — CARVEDILOL 25 MG PO TABS
25.0000 mg | ORAL_TABLET | Freq: Two times a day (BID) | ORAL | Status: DC
  Administered 2019-10-21 – 2019-10-25 (×9): 25 mg via ORAL
  Filled 2019-10-21 (×11): qty 1

## 2019-10-21 MED ORDER — LORAZEPAM 1 MG PO TABS
2.0000 mg | ORAL_TABLET | Freq: Once | ORAL | Status: AC
  Administered 2019-10-21: 2 mg via ORAL
  Filled 2019-10-21: qty 2

## 2019-10-21 MED ORDER — VORTIOXETINE HBR 10 MG PO TABS
10.0000 mg | ORAL_TABLET | Freq: Every day | ORAL | Status: DC
  Administered 2019-10-21 – 2019-10-23 (×3): 10 mg via ORAL
  Filled 2019-10-21 (×5): qty 1

## 2019-10-21 MED ORDER — PERPHENAZINE 4 MG PO TABS
4.0000 mg | ORAL_TABLET | Freq: Two times a day (BID) | ORAL | Status: DC
  Administered 2019-10-21 – 2019-10-22 (×2): 4 mg via ORAL
  Filled 2019-10-21 (×6): qty 1

## 2019-10-21 NOTE — Progress Notes (Signed)
Pt woke up stating she did not like the way the medication made her fel, pt given  25 mg Vistaril per [facility]

## 2019-10-21 NOTE — Progress Notes (Signed)
    10/21/19 1500  Psych Admission Type (Psych Patients Only)  Admission Status Voluntary  Psychosocial Assessment  Patient Complaints Depression  Eye Contact Fair  Facial Expression Anxious;Pensive;Worried  Affect Anxious;Appropriate to circumstance;Preoccupied  Speech Logical/coherent  Interaction Assertive  Motor Activity Fidgety;Restless  Appearance/Hygiene Unremarkable  Behavior Characteristics Cooperative  Mood Depressed  Thought Process  Coherency WDL  Content WDL  Delusions None reported or observed  Perception Hallucinations  Hallucination Auditory  Judgment Poor  Confusion None  Danger to Self  Current suicidal ideation? Denies  Danger to Others  Danger to Others None reported or observed

## 2019-10-21 NOTE — Progress Notes (Cosign Needed)
MSW intern called pt's daughter, [name], [phone] to let her know that the MRI order was put in and she will get it before she is discharged.   Earlyne Iba, MSW intern

## 2019-10-21 NOTE — Progress Notes (Signed)
The patient verbalized in group that her positive event for the day was that she went out for an M.R.I. Her goal for tomorrow is to have her medications adjusted.

## 2019-10-21 NOTE — Progress Notes (Signed)
[facility] MD Progress Note  10/21/2019 8:34 AM Michaela Wells  MRN:  [id number] Subjective:   Michaela Poulton Moxleyis an 59 y.o.femalepresenting as a walk-in to [facility] with complaints of worsening anxiety and delusions of thinking neighbors are in her head. Patient reported she feels there energy in her head. Patient reported living in [place] and stating the mental health hospitals in [place] are not good and the ones in [place] are better. Patient reported being off medications for 2 weeks. Patient also elaborated a history of alcohol dependency in remission.  Several years but tested to have mild cognitive impairment on initial evaluation  Patient continues to endorse the neighbors are "in her head" and will not leave her alone she begins crying, becomes distraught and tearful it was "the neighbors are doing inside her head" She denies wanting to harm herself she continues to have the same delusional beliefs.  Daughter is requesting MRI which is certainly reasonable  Principal Problem: Delusions Diagnosis: Active Problems:   MDD (major depressive disorder)  Total Time spent with patient: 20 minutes  Past Psychiatric History: He had history of alcohol dependency but this is the first onset of psychosis  Past Medical History:  Past Medical History:  Diagnosis Date  . Bronchial asthma   . Chicken pox   . COPD (chronic obstructive pulmonary disease) ([place])   . Depression   . Frequent headaches   . GERD (gastroesophageal reflux disease)   . UTI (lower urinary tract infection)     Past Surgical History:  Procedure Laterality Date  . ESOPHAGEAL DILATION    . TONSILLECTOMY     Family History:  Family History  Problem Relation Age of Onset  . Other Mother        MVA-Deceased [pt was age 40]  . COPD Father        Deceased  . Heart attack Maternal Grandmother   . Stroke Maternal Grandmother   . Brain cancer Maternal Grandmother   . Arthritis/Rheumatoid Maternal Grandmother   .  Heart attack Maternal Grandfather   . Stroke Maternal Grandfather   . Heart disease Maternal Aunt   . Stroke Maternal Aunt        #1  . Heart attack Maternal Aunt        #1  . Hypertension Maternal Aunt        #1  . Arthritis/Rheumatoid Sister   . COPD Sister   . Heart disease Maternal Uncle   . Heart attack Maternal Uncle        x4  . Heart disease Brother        #1  . Heart attack Brother        #1  . Diabetes Brother   . Diabetes Maternal Aunt   . Mental retardation Maternal Aunt   . Healthy Daughter        x2  . Drug abuse Daughter        #1  . Drug abuse Daughter        #2  . Kidney Stones Daughter        #2  . Other Son        Agoraphobia   Family Psychiatric  History: see Evaluation Social History:  Social History   Substance and Sexual Activity  Alcohol Use Yes     Social History   Substance and Sexual Activity  Drug Use Not Currently    Social History   Socioeconomic History  . Marital status: Single  Spouse name: Not on file  . Number of children: Not on file  . Years of education: Not on file  . Highest education level: Not on file  Occupational History  . Not on file  Tobacco Use  . Smoking status: Current Every Day Smoker  . Smokeless tobacco: Never Used  Substance and Sexual Activity  . Alcohol use: Yes  . Drug use: Not Currently  . Sexual activity: Not Currently  Other Topics Concern  . Not on file  Social History Narrative  . Not on file   Social Determinants of Health   Financial Resource Strain:   . Difficulty of Paying Living Expenses: Not on file  Food Insecurity:   . Worried About Programme researcher, broadcasting/film/video in the Last Year: Not on file  . Ran Out of Food in the Last Year: Not on file  Transportation Needs:   . Lack of Transportation (Medical): Not on file  . Lack of Transportation (Non-Medical): Not on file  Physical Activity:   . Days of Exercise per Week: Not on file  . Minutes of Exercise per Session: Not on file   Stress:   . Feeling of Stress : Not on file  Social Connections:   . Frequency of Communication with Friends and Family: Not on file  . Frequency of Social Gatherings with Friends and Family: Not on file  . Attends Religious Services: Not on file  . Active Member of Clubs or Organizations: Not on file  . Attends Banker Meetings: Not on file  . Marital Status: Not on file   Additional Social History:    Pain Medications: see MAR Prescriptions: see MAR Over the Counter: see MAR                    Sleep: Fair  Appetite:  Fair  Current Medications: Current Facility-Administered Medications  Medication Dose Route Frequency Provider Last Rate Last Admin  . acetaminophen (TYLENOL) tablet 650 mg  650 mg Oral Q6H PRN Jearld Lesch, NP   650 mg at 10/20/19 0554  . albuterol (VENTOLIN HFA) 108 (90 Base) MCG/ACT inhaler 2 puff  2 puff Inhalation Q6H PRN Malvin Johns, MD      . alum & mag hydroxide-simeth (MAALOX/MYLANTA) 200-200-20 MG/5ML suspension 30 mL  30 mL Oral Q4H PRN Dixon, Rashaun M, NP      . donepezil (ARICEPT) tablet 10 mg  10 mg Oral QHS Malvin Johns, MD   10 mg at 10/20/19 2108  . hydrOXYzine (ATARAX/VISTARIL) tablet 25 mg  25 mg Oral TID PRN Jearld Lesch, NP      . lisinopril (ZESTRIL) tablet 20 mg  20 mg Oral Daily Aldean Baker, NP   20 mg at 10/21/19 0813  . magnesium hydroxide (MILK OF MAGNESIA) suspension 30 mL  30 mL Oral Daily PRN Jearld Lesch, NP      . memantine (NAMENDA) tablet 10 mg  10 mg Oral BID Malvin Johns, MD      . pantoprazole (PROTONIX) EC tablet 20 mg  20 mg Oral Daily Aldean Baker, NP   20 mg at 10/21/19 0813  . perphenazine (TRILAFON) tablet 4 mg  4 mg Oral BID Malvin Johns, MD      . prenatal multivitamin tablet 1 tablet  1 tablet Oral Q1200 Malvin Johns, MD   1 tablet at 10/20/19 1621  . temazepam (RESTORIL) capsule 15 mg  15 mg Oral QHS Malvin Johns, MD   15  mg at 10/20/19 2109  . umeclidinium-vilanterol (ANORO  ELLIPTA) 62.5-25 MCG/INH 1 puff  1 puff Inhalation Daily Malvin Johns, MD   1 puff at 10/21/19 1093    Lab Results: No results found for this or any previous visit (from the past 48 hour(s)).  Blood Alcohol level:  Lab Results  Component Value Date   ETH <10 05/05/2018   ETH <10 01/04/2018    Metabolic Disorder Labs: Lab Results  Component Value Date   HGBA1C 6.7 (H) 05/06/2018   MPG 145.59 05/06/2018   No results found for: PROLACTIN Lab Results  Component Value Date   CHOL 203 (H) 05/06/2018   TRIG 73 05/06/2018   HDL 51 05/06/2018   CHOLHDL 4.0 05/06/2018   VLDL 15 05/06/2018   LDLCALC 137 (H) 05/06/2018    Physical Findings: AIMS: Facial and Oral Movements Muscles of Facial Expression: None, normal Lips and Perioral Area: None, normal Jaw: None, normal Tongue: None, normal,Extremity Movements Upper (arms, wrists, hands, fingers): None, normal Lower (legs, knees, ankles, toes): None, normal, Trunk Movements Neck, shoulders, hips: None, normal, Overall Severity Severity of abnormal movements (highest score from questions above): None, normal Incapacitation due to abnormal movements: None, normal Patient's awareness of abnormal movements (rate only patient's report): No Awareness, Dental Status Current problems with teeth and/or dentures?: No Does patient usually wear dentures?: No  CIWA:  CIWA-Ar Total: 0 COWS:  COWS Total Score: 1  Musculoskeletal: Strength & Muscle Tone: within normal limits Gait & Station: normal Patient leans: N/A  Psychiatric Specialty Exam: Physical Exam  Review of Systems  Blood pressure (!) 193/109, pulse 80, temperature 97.7 F (36.5 C), temperature source Oral, resp. rate 18, SpO2 100 %.There is no height or weight on file to calculate BMI.  General Appearance: Casual  Eye Contact:  Good  Speech:  Clear and Coherent and Pressured  Volume:  Increased  Mood:  Anxious and Depressed  Affect:  Tearful  Thought Process:  Goal  Directed and Descriptions of Associations: Circumstantial  Orientation:  Full (Time, Place, and Person)  Thought Content:  Illogical, Delusions and Hallucinations: Auditory  Suicidal Thoughts:  No  Homicidal Thoughts:  No  Memory:  Immediate;   Fair Recent;   Fair Remote;   Fair  Judgement:  Fair  Insight:  Fair  Psychomotor Activity:  Normal  Concentration:  Concentration: Fair and Attention Span: Fair  Recall:  [employer] of Knowledge:  Fair  Language:  Fair  Akathisia:  Negative  Handed:  Right  AIMS (if indicated):     Assets:  Physical Health Resilience  ADL's:  Intact  Cognition:  WNL  Sleep:  Number of Hours: 6.75     Treatment Plan Summary: Daily contact with patient to assess and evaluate symptoms and progress in treatment and Medication management  Refusing for psychosis Continue cognitive therapy continue antidepressant therapy check MRI for evidence of dementing illness address hypertension  Kimberlyn Quiocho, MD 10/21/2019, 8:34 AM

## 2019-10-21 NOTE — Progress Notes (Signed)
   10/21/19 2100  Psych Admission Type (Psych Patients Only)  Admission Status Voluntary  Psychosocial Assessment  Patient Complaints Depression  Eye Contact Fair  Facial Expression Anxious;Pensive;Worried  Affect Anxious;Appropriate to circumstance;Preoccupied  Speech Logical/coherent  Interaction Assertive  Motor Activity Fidgety;Restless  Appearance/Hygiene Unremarkable  Behavior Characteristics Cooperative  Mood Depressed  Thought Process  Coherency WDL  Content WDL  Delusions None reported or observed  Perception Hallucinations  Hallucination Auditory  Judgment Poor  Confusion None  Danger to Self  Current suicidal ideation? Denies  Danger to Others  Danger to Others None reported or observed   Pt stated AH getting better

## 2019-10-22 MED ORDER — PERPHENAZINE 2 MG PO TABS
4.0000 mg | ORAL_TABLET | Freq: Every day | ORAL | Status: DC
  Administered 2019-10-23 – 2019-10-25 (×3): 4 mg via ORAL
  Filled 2019-10-22 (×4): qty 2

## 2019-10-22 MED ORDER — BUSPIRONE HCL 15 MG PO TABS
15.0000 mg | ORAL_TABLET | Freq: Three times a day (TID) | ORAL | Status: DC
  Administered 2019-10-22 – 2019-10-23 (×3): 15 mg via ORAL
  Filled 2019-10-22 (×6): qty 1

## 2019-10-22 MED ORDER — PERPHENAZINE 2 MG PO TABS
6.0000 mg | ORAL_TABLET | Freq: Every day | ORAL | Status: DC
  Administered 2019-10-23 (×2): 6 mg via ORAL
  Filled 2019-10-22 (×3): qty 3

## 2019-10-22 NOTE — Progress Notes (Signed)
Pt attended spirituality group facilitated by Wilkie Aye, MDIv, BCC.  Group Description:  Group focused on topic of hope.  Patients participated in facilitated discussion around topic, connecting with one another around experiences and definitions for hope.  Group members engaged with visual explorer photos, reflecting on what hope looks like for them today.  Group engaged in discussion around how their definitions of hope are present today in hospital.   Modalities: Psycho-social ed, Adlerian, Narrative, MI  Patient Progress: [place] was present throughout group.  Alert to topic, she engaged in discussion with facilitator and group members.  She noted she found hope in her family and she learning to understand one another better.  She also spoke of her faith practices - and learning new ways to connect to love.

## 2019-10-22 NOTE — Progress Notes (Signed)
Recreation Therapy Notes  Date: 2.26.21 Time: 0950 Location:  [address] Dayroom  Group Topic: Communication, Team Building, Problem Solving  Goal Area(s) Addresses:  Patient will effectively work with peer towards shared goal.  Patient will identify skills used to make activity successful.  Patient will identify how skills used during activity can be used to reach post d/c goals.   Behavioral Response: Engaged  Intervention: STEM Activity  Activity: Stage manager. In teams patients were given 12 plastic drinking straws and a length of masking tape. Using the materials provided patients were asked to build a landing pad to catch a golf ball dropped from approximately 6 feet in the air.   Education: Pharmacist, community, Discharge Planning   Education Outcome: Acknowledges education/In group clarification offered/Needs additional education.   Clinical Observations/Feedback:  Pt and partner were able to construct and landing pad that caught the ball.  Pt expressed they had to work together to complete the activity.  Pt was bright, active and engaged during activity.    Caroll Rancher, LRT/CTRS         Caroll Rancher A 10/22/2019 10:56 AM

## 2019-10-22 NOTE — Progress Notes (Signed)
   10/22/19 2000  Psych Admission Type (Psych Patients Only)  Admission Status Voluntary  Psychosocial Assessment  Patient Complaints Depression  Eye Contact Fair  Facial Expression Anxious;Pensive;Worried  Affect Anxious;Appropriate to circumstance;Preoccupied  Speech Logical/coherent  Interaction Assertive  Motor Activity Fidgety;Restless  Appearance/Hygiene Unremarkable  Behavior Characteristics Cooperative  Mood Depressed  Thought Process  Coherency WDL  Content WDL  Delusions None reported or observed  Perception Hallucinations  Hallucination Auditory  Judgment Poor  Confusion None  Danger to Self  Current suicidal ideation? Denies  Danger to Others  Danger to Others None reported or observed

## 2019-10-22 NOTE — Progress Notes (Signed)
[facility] MD Progress Note  10/22/2019 9:15 AM Michaela Wells  MRN:  [id number] Subjective:   Michaela Wells is a 59, she has a history of alcohol dependency that is in remission, her MRI of 2/25 demonstrated advanced small vessel ischemic changes, and she presented on 2/23 with great distress and anxiety related to her delusional believes, that neighbors were inside her head" putting voices in her head, and expressed numerous other paranoid delusions.  She had been prescribed quetiapine as an outpatient with minimal benefit and was noncompliant at the point of discharge.  Since admission she has benefited somewhat from antipsychotic and reality based therapy and reports the voices are "a lot less" but are still present.  She is still delusional but again there is less intensity and conviction. Have left messages for her daughter and will try to continue to be in touch Patient is less depressed and is not tearful today Findings of the scan are discussed with her she is at risk for vascular type dementia only seen for new onset psychosis as a harbinger of this type of process  Principal Problem: Psychosis/depression/CNS small vessel ischemic disease Diagnosis: Active Problems:   MDD (major depressive disorder)  Total Time spent with patient: 20 minutes  Past Psychiatric History: History of alcohol dependency  Past Medical History:  Past Medical History:  Diagnosis Date  . Bronchial asthma   . Chicken pox   . COPD (chronic obstructive pulmonary disease) ([facility])   . Depression   . Frequent headaches   . GERD (gastroesophageal reflux disease)   . UTI (lower urinary tract infection)     Past Surgical History:  Procedure Laterality Date  . ESOPHAGEAL DILATION    . TONSILLECTOMY     Family History:  Family History  Problem Relation Age of Onset  . Other Mother        MVA-Deceased [pt was age 28]  . COPD Father        Deceased  . Heart attack Maternal Grandmother   . Stroke Maternal  Grandmother   . Brain cancer Maternal Grandmother   . Arthritis/Rheumatoid Maternal Grandmother   . Heart attack Maternal Grandfather   . Stroke Maternal Grandfather   . Heart disease Maternal Aunt   . Stroke Maternal Aunt        #1  . Heart attack Maternal Aunt        #1  . Hypertension Maternal Aunt        #1  . Arthritis/Rheumatoid Sister   . COPD Sister   . Heart disease Maternal Uncle   . Heart attack Maternal Uncle        x4  . Heart disease Brother        #1  . Heart attack Brother        #1  . Diabetes Brother   . Diabetes Maternal Aunt   . Mental retardation Maternal Aunt   . Healthy Daughter        x2  . Drug abuse Daughter        #1  . Drug abuse Daughter        #2  . Kidney Stones Daughter        #2  . Other Son        Agoraphobia   Family Psychiatric  History: No new data Social History:  Social History   Substance and Sexual Activity  Alcohol Use Yes     Social History   Substance and Sexual Activity  Drug  Use Not Currently    Social History   Socioeconomic History  . Marital status: Single    Spouse name: Not on file  . Number of children: Not on file  . Years of education: Not on file  . Highest education level: Not on file  Occupational History  . Not on file  Tobacco Use  . Smoking status: Current Every Day Smoker  . Smokeless tobacco: Never Used  Substance and Sexual Activity  . Alcohol use: Yes  . Drug use: Not Currently  . Sexual activity: Not Currently  Other Topics Concern  . Not on file  Social History Narrative  . Not on file   Social Determinants of Health   Financial Resource Strain:   . Difficulty of Paying Living Expenses: Not on file  Food Insecurity:   . Worried About Programme researcher, broadcasting/film/video in the Last Year: Not on file  . Ran Out of Food in the Last Year: Not on file  Transportation Needs:   . Lack of Transportation (Medical): Not on file  . Lack of Transportation (Non-Medical): Not on file  Physical Activity:    . Days of Exercise per Week: Not on file  . Minutes of Exercise per Session: Not on file  Stress:   . Feeling of Stress : Not on file  Social Connections:   . Frequency of Communication with Friends and Family: Not on file  . Frequency of Social Gatherings with Friends and Family: Not on file  . Attends Religious Services: Not on file  . Active Member of Clubs or Organizations: Not on file  . Attends Banker Meetings: Not on file  . Marital Status: Not on file   Additional Social History:    Pain Medications: see MAR Prescriptions: see MAR Over the Counter: see MAR                    Sleep: Fair  Appetite:  Fair  Current Medications: Current Facility-Administered Medications  Medication Dose Route Frequency Provider Last Rate Last Admin  . acetaminophen (TYLENOL) tablet 650 mg  650 mg Oral Q6H PRN Jearld Lesch, NP   650 mg at 10/20/19 0554  . albuterol (VENTOLIN HFA) 108 (90 Base) MCG/ACT inhaler 2 puff  2 puff Inhalation Q6H PRN Malvin Johns, MD      . alum & mag hydroxide-simeth (MAALOX/MYLANTA) 200-200-20 MG/5ML suspension 30 mL  30 mL Oral Q4H PRN Dixon, Rashaun M, NP      . carvedilol (COREG) tablet 25 mg  25 mg Oral BID WC Malvin Johns, MD   25 mg at 10/22/19 0820  . donepezil (ARICEPT) tablet 10 mg  10 mg Oral QHS Malvin Johns, MD   10 mg at 10/21/19 2058  . hydrOXYzine (ATARAX/VISTARIL) tablet 25 mg  25 mg Oral TID PRN Jearld Lesch, NP   25 mg at 10/22/19 0821  . lisinopril (ZESTRIL) tablet 20 mg  20 mg Oral Daily Aldean Baker, NP   20 mg at 10/22/19 0938  . magnesium hydroxide (MILK OF MAGNESIA) suspension 30 mL  30 mL Oral Daily PRN Jearld Lesch, NP      . memantine (NAMENDA) tablet 10 mg  10 mg Oral BID Malvin Johns, MD   10 mg at 10/21/19 1844  . pantoprazole (PROTONIX) EC tablet 20 mg  20 mg Oral Daily Aldean Baker, NP   20 mg at 10/22/19 0820  . perphenazine (TRILAFON) tablet 4 mg  4 mg  Oral BID Johnn Hai, MD   4 mg at  10/22/19 3532  . prenatal multivitamin tablet 1 tablet  1 tablet Oral Q1200 Johnn Hai, MD   1 tablet at 10/21/19 1221  . temazepam (RESTORIL) capsule 15 mg  15 mg Oral QHS Johnn Hai, MD   15 mg at 10/21/19 2058  . umeclidinium-vilanterol (ANORO ELLIPTA) 62.5-25 MCG/INH 1 puff  1 puff Inhalation Daily Johnn Hai, MD   1 puff at 10/22/19 0820  . vortioxetine HBr (TRINTELLIX) tablet 10 mg  10 mg Oral Daily Johnn Hai, MD   10 mg at 10/22/19 0820    Lab Results:  Results for orders placed or performed during the hospital encounter of 10/19/19 (from the past 48 hour(s))  I-STAT creatinine     Status: None   Collection Time: 10/21/19  2:16 PM  Result Value Ref Range   Creatinine, Ser 0.90 0.44 - 1.00 mg/dL    Blood Alcohol level:  Lab Results  Component Value Date   ETH <10 05/05/2018   ETH <10 99/24/2683    Metabolic Disorder Labs: Lab Results  Component Value Date   HGBA1C 6.7 (H) 05/06/2018   MPG 145.59 05/06/2018   No results found for: PROLACTIN Lab Results  Component Value Date   CHOL 203 (H) 05/06/2018   TRIG 73 05/06/2018   HDL 51 05/06/2018   CHOLHDL 4.0 05/06/2018   VLDL 15 05/06/2018   LDLCALC 137 (H) 05/06/2018    Physical Findings: AIMS: Facial and Oral Movements Muscles of Facial Expression: None, normal Lips and Perioral Area: None, normal Jaw: None, normal Tongue: None, normal,Extremity Movements Upper (arms, wrists, hands, fingers): None, normal Lower (legs, knees, ankles, toes): None, normal, Trunk Movements Neck, shoulders, hips: None, normal, Overall Severity Severity of abnormal movements (highest score from questions above): None, normal Incapacitation due to abnormal movements: None, normal Patient's awareness of abnormal movements (rate only patient's report): No Awareness, Dental Status Current problems with teeth and/or dentures?: No Does patient usually wear dentures?: No  CIWA:  CIWA-Ar Total: 0 COWS:  COWS Total Score:  1  Musculoskeletal: Strength & Muscle Tone: within normal limits Gait & Station: normal Patient leans: N/A  Psychiatric Specialty Exam: Physical Exam  Review of Systems  Blood pressure (!) 159/97, pulse 67, temperature 98.1 F (36.7 C), temperature source Oral, resp. rate 18, SpO2 100 %.There is no height or weight on file to calculate BMI.  General Appearance: Casual  Eye Contact:  Fair  Speech:  Clear and Coherent  Volume:  Normal  Mood:  Anxious and Dysphoric  Affect:  Appropriate and Congruent  Thought Process:  Coherent and Goal Directed  Orientation:  Full (Time, Place, and Person)  Thought Content:  Illogical, Delusions and Hallucinations: Auditory  Suicidal Thoughts:  No  Homicidal Thoughts:  No  Memory:  Immediate;   Poor Recent;   Fair Remote;   Fair  Judgement:  Fair  Insight:  Fair  Psychomotor Activity:  Normal  Concentration:  Concentration: Fair and Attention Span: Fair  Recall:  [employer] of Knowledge:  Fair  Language:  Fair  Akathisia:  Negative  Handed:  Right  AIMS (if indicated):     Assets:  Leisure Time Physical Health Resilience Social Support  ADL's:  Intact  Cognition:  WNL  Sleep:  Number of Hours: 8   Treatment Plan Summary: Daily contact with patient to assess and evaluate symptoms and progress in treatment and Medication management  For depression continue vortioxetine, for anxiety  complaints add BuSpar as augmentor - dementia component- continue Namenda/Aricept/B vitamins For psychosis escalate perphenazine to 10 mg total No change in precautions continue to try to reach daughter     Malvin Johns, MD 10/22/2019, 9:15 AM

## 2019-10-23 ENCOUNTER — Other Ambulatory Visit: Payer: Self-pay

## 2019-10-23 DIAGNOSIS — F250 Schizoaffective disorder, bipolar type: Secondary | ICD-10-CM

## 2019-10-23 DIAGNOSIS — F29 Unspecified psychosis not due to a substance or known physiological condition: Secondary | ICD-10-CM | POA: Diagnosis present

## 2019-10-23 LAB — COMPREHENSIVE METABOLIC PANEL
ALT: 15 U/L (ref 0–44)
AST: 16 U/L (ref 15–41)
Albumin: 4.2 g/dL (ref 3.5–5.0)
Alkaline Phosphatase: 55 U/L (ref 38–126)
Anion gap: 7 (ref 5–15)
BUN: 21 mg/dL — ABNORMAL HIGH (ref 6–20)
CO2: 24 mmol/L (ref 22–32)
Calcium: 8.6 mg/dL — ABNORMAL LOW (ref 8.9–10.3)
Chloride: 108 mmol/L (ref 98–111)
Creatinine, Ser: 1.04 mg/dL — ABNORMAL HIGH (ref 0.44–1.00)
GFR calc Af Amer: >60 mL/min (ref >60)
GFR calc non Af Amer: 59 mL/min — ABNORMAL LOW (ref >60)
Glucose, Bld: 116 mg/dL — ABNORMAL HIGH (ref 70–99)
Potassium: 3.9 mmol/L (ref 3.5–5.1)
Sodium: 139 mmol/L (ref 135–145)
Total Bilirubin: 0.3 mg/dL (ref 0.3–1.2)
Total Protein: 7.4 g/dL (ref 6.5–8.1)

## 2019-10-23 LAB — CBC WITH DIFFERENTIAL/PLATELET
Abs Immature Granulocytes: 0.05 10*3/uL (ref 0.00–0.07)
Basophils Absolute: 0.1 10*3/uL (ref 0.0–0.1)
Basophils Relative: 1 %
Eosinophils Absolute: 0.2 10*3/uL (ref 0.0–0.5)
Eosinophils Relative: 2 %
HCT: 41 % (ref 36.0–46.0)
Hemoglobin: 13.1 g/dL (ref 12.0–15.0)
Immature Granulocytes: 1 %
Lymphocytes Relative: 37 %
Lymphs Abs: 2.8 10*3/uL (ref 0.7–4.0)
MCH: 30.3 pg (ref 26.0–34.0)
MCHC: 32 g/dL (ref 30.0–36.0)
MCV: 94.9 fL (ref 80.0–100.0)
Monocytes Absolute: 0.7 10*3/uL (ref 0.1–1.0)
Monocytes Relative: 10 %
Neutro Abs: 3.7 10*3/uL (ref 1.7–7.7)
Neutrophils Relative %: 49 %
Platelets: 308 10*3/uL (ref 150–400)
RBC: 4.32 MIL/uL (ref 3.87–5.11)
RDW: 13.3 % (ref 11.5–15.5)
WBC: 7.5 10*3/uL (ref 4.0–10.5)
nRBC: 0 % (ref 0.0–0.2)

## 2019-10-23 LAB — URINALYSIS, COMPLETE (UACMP) WITH MICROSCOPIC
Bacteria, UA: NONE SEEN
Bilirubin Urine: NEGATIVE
Glucose, UA: NEGATIVE mg/dL
Hgb urine dipstick: NEGATIVE
Ketones, ur: NEGATIVE mg/dL
Leukocytes,Ua: NEGATIVE
Nitrite: NEGATIVE
Protein, ur: NEGATIVE mg/dL
Specific Gravity, Urine: 1.005 (ref 1.005–1.030)
pH: 6 (ref 5.0–8.0)

## 2019-10-23 LAB — TSH: TSH: 3.388 u[IU]/mL (ref 0.350–4.500)

## 2019-10-23 MED ORDER — OMEGA-3-ACID ETHYL ESTERS 1 G PO CAPS
1.0000 g | ORAL_CAPSULE | Freq: Two times a day (BID) | ORAL | Status: DC
  Administered 2019-10-23 – 2019-10-25 (×4): 1 g via ORAL
  Filled 2019-10-23 (×8): qty 1

## 2019-10-23 NOTE — Progress Notes (Signed)
   10/23/19 1400  Psych Admission Type (Psych Patients Only)  Admission Status Voluntary  Psychosocial Assessment  Patient Complaints Depression  Eye Contact Fair  Facial Expression Anxious;Worried  Affect Anxious;Appropriate to circumstance;Preoccupied  Speech Logical/coherent  Interaction Assertive  Motor Activity Fidgety;Restless  Appearance/Hygiene Unremarkable  Behavior Characteristics Cooperative;Anxious  Mood Depressed  Aggressive Behavior  Effect No apparent injury  Thought Process  Coherency WDL  Content WDL  Delusions None reported or observed  Perception Hallucinations  Hallucination Auditory  Judgment Poor  Confusion None  Danger to Self  Current suicidal ideation? Denies  Danger to Others  Danger to Others None reported or observed

## 2019-10-23 NOTE — Progress Notes (Signed)
[facility] MD Progress Note  10/23/2019 11:44 AM Michaela Wells  MRN:  [id number] Subjective: Patient is a 59 year old female who presented to the [facility] health hospital as a walk-in on 10/19/2019.  She complained of worsening anxiety and auditory hallucinations and paranoia.  Objective: Patient is seen and examined.  Patient is a 59 year old female with a past psychiatric history significant for previous diagnosis of schizophrenia, alcohol dependence in remission, and cerebrovascular disease by MRI.  She is seen in follow-up.  She stated that she is still having auditory hallucinations.  She stated she thinks that this is from her neighbor.  She stated that is the reason why she came to the hospital, but those have not really changed.  She stated that the nurses keep mixing up her medications.  Sometimes they tell her that she has trazodone available, and other times they do not.  Because of her cerebrovascular disease I checked on her knowledge base as well as her orientation.  She is oriented x3 today without great difficulty.  Her current medications include BuSpar, Coreg, Aricept, lisinopril, Namenda, Trilafon, Restoril, and Trintellix.  Her blood pressure is significantly elevated at 180/99 and repeat was 182/107.  He only slept 4.75 hours last night.  Unfortunately it does not appear as though any of her labs have been collected at this point.  Also we do not have an EKG at this point.  Principal Problem: <principal problem not specified> Diagnosis: Active Problems:   MDD (major depressive disorder)  Total Time spent with patient: 20 minutes  Past Psychiatric History: See admission H&P  Past Medical History:  Past Medical History:  Diagnosis Date  . Bronchial asthma   . Chicken pox   . COPD (chronic obstructive pulmonary disease) (HCC)   . Depression   . Frequent headaches   . GERD (gastroesophageal reflux disease)   . UTI (lower urinary tract infection)     Past Surgical History:   Procedure Laterality Date  . ESOPHAGEAL DILATION    . TONSILLECTOMY     Family History:  Family History  Problem Relation Age of Onset  . Other Mother        MVA-Deceased [pt was age 24]  . COPD Father        Deceased  . Heart attack Maternal Grandmother   . Stroke Maternal Grandmother   . Brain cancer Maternal Grandmother   . Arthritis/Rheumatoid Maternal Grandmother   . Heart attack Maternal Grandfather   . Stroke Maternal Grandfather   . Heart disease Maternal Aunt   . Stroke Maternal Aunt        #1  . Heart attack Maternal Aunt        #1  . Hypertension Maternal Aunt        #1  . Arthritis/Rheumatoid Sister   . COPD Sister   . Heart disease Maternal Uncle   . Heart attack Maternal Uncle        x4  . Heart disease Brother        #1  . Heart attack Brother        #1  . Diabetes Brother   . Diabetes Maternal Aunt   . Mental retardation Maternal Aunt   . Healthy Daughter        x2  . Drug abuse Daughter        #1  . Drug abuse Daughter        #2  . Kidney Stones Daughter        #  2  . Other Son        Agoraphobia   Family Psychiatric  History: Admission H&P Social History:  Social History   Substance and Sexual Activity  Alcohol Use Yes     Social History   Substance and Sexual Activity  Drug Use Not Currently    Social History   Socioeconomic History  . Marital status: Single    Spouse name: Not on file  . Number of children: Not on file  . Years of education: Not on file  . Highest education level: Not on file  Occupational History  . Not on file  Tobacco Use  . Smoking status: Current Every Day Smoker  . Smokeless tobacco: Never Used  Substance and Sexual Activity  . Alcohol use: Yes  . Drug use: Not Currently  . Sexual activity: Not Currently  Other Topics Concern  . Not on file  Social History Narrative  . Not on file   Social Determinants of Health   Financial Resource Strain:   . Difficulty of Paying Living Expenses: Not on  file  Food Insecurity:   . Worried About Programme researcher, broadcasting/film/video in the Last Year: Not on file  . Ran Out of Food in the Last Year: Not on file  Transportation Needs:   . Lack of Transportation (Medical): Not on file  . Lack of Transportation (Non-Medical): Not on file  Physical Activity:   . Days of Exercise per Week: Not on file  . Minutes of Exercise per Session: Not on file  Stress:   . Feeling of Stress : Not on file  Social Connections:   . Frequency of Communication with Friends and Family: Not on file  . Frequency of Social Gatherings with Friends and Family: Not on file  . Attends Religious Services: Not on file  . Active Member of Clubs or Organizations: Not on file  . Attends Banker Meetings: Not on file  . Marital Status: Not on file   Additional Social History:    Pain Medications: see MAR Prescriptions: see MAR Over the Counter: see MAR                    Sleep: Fair  Appetite:  Fair  Current Medications: Current Facility-Administered Medications  Medication Dose Route Frequency Provider Last Rate Last Admin  . acetaminophen (TYLENOL) tablet 650 mg  650 mg Oral Q6H PRN Jearld Lesch, NP   650 mg at 10/20/19 0554  . albuterol (VENTOLIN HFA) 108 (90 Base) MCG/ACT inhaler 2 puff  2 puff Inhalation Q6H PRN Malvin Johns, MD      . alum & mag hydroxide-simeth (MAALOX/MYLANTA) 200-200-20 MG/5ML suspension 30 mL  30 mL Oral Q4H PRN Dixon, Rashaun M, NP      . busPIRone (BUSPAR) tablet 15 mg  15 mg Oral TID Malvin Johns, MD   15 mg at 10/23/19 0818  . carvedilol (COREG) tablet 25 mg  25 mg Oral BID WC Malvin Johns, MD   25 mg at 10/23/19 0818  . donepezil (ARICEPT) tablet 10 mg  10 mg Oral QHS Malvin Johns, MD   10 mg at 10/22/19 2054  . hydrOXYzine (ATARAX/VISTARIL) tablet 25 mg  25 mg Oral TID PRN Jearld Lesch, NP   25 mg at 10/22/19 2054  . lisinopril (ZESTRIL) tablet 20 mg  20 mg Oral Daily Aldean Baker, NP   20 mg at 10/23/19 0818  .  magnesium hydroxide (MILK OF MAGNESIA)  suspension 30 mL  30 mL Oral Daily PRN Deloria Lair, NP      . memantine [facility]) tablet 10 mg  10 mg Oral BID Johnn Hai, MD   10 mg at 10/22/19 2053  . pantoprazole (PROTONIX) EC tablet 20 mg  20 mg Oral Daily Connye Burkitt, NP   20 mg at 10/23/19 0818  . perphenazine (TRILAFON) tablet 4 mg  4 mg Oral Daily Johnn Hai, MD   4 mg at 10/23/19 0818  . perphenazine (TRILAFON) tablet 6 mg  6 mg Oral QHS Johnn Hai, MD   6 mg at 10/23/19 0141  . prenatal multivitamin tablet 1 tablet  1 tablet Oral Q1200 Johnn Hai, MD   1 tablet at 10/22/19 1419  . temazepam (RESTORIL) capsule 15 mg  15 mg Oral QHS Johnn Hai, MD   15 mg at 10/22/19 2054  . umeclidinium-vilanterol (ANORO ELLIPTA) 62.5-25 MCG/INH 1 puff  1 puff Inhalation Daily Johnn Hai, MD   1 puff at 10/23/19 [phone]  . vortioxetine HBr (TRINTELLIX) tablet 10 mg  10 mg Oral Daily Johnn Hai, MD   10 mg at 10/23/19 7322    Lab Results:  Results for orders placed or performed during the hospital encounter of 10/19/19 (from the past 48 hour(s))  I-STAT creatinine     Status: None   Collection Time: 10/21/19  2:16 PM  Result Value Ref Range   Creatinine, Ser 0.90 0.44 - 1.00 mg/dL    Blood Alcohol level:  Lab Results  Component Value Date   ETH <10 05/05/2018   ETH <10 02/54/2706    Metabolic Disorder Labs: Lab Results  Component Value Date   HGBA1C 6.7 (H) 05/06/2018   MPG 145.59 05/06/2018   No results found for: PROLACTIN Lab Results  Component Value Date   CHOL 203 (H) 05/06/2018   TRIG 73 05/06/2018   HDL 51 05/06/2018   CHOLHDL 4.0 05/06/2018   VLDL 15 05/06/2018   LDLCALC 137 (H) 05/06/2018    Physical Findings: AIMS: Facial and Oral Movements Muscles of Facial Expression: None, normal Lips and Perioral Area: None, normal Jaw: None, normal Tongue: None, normal,Extremity Movements Upper (arms, wrists, hands, fingers): None, normal Lower (legs, knees, ankles,  toes): None, normal, Trunk Movements Neck, shoulders, hips: None, normal, Overall Severity Severity of abnormal movements (highest score from questions above): None, normal Incapacitation due to abnormal movements: None, normal Patient's awareness of abnormal movements (rate only patient's report): No Awareness, Dental Status Current problems with teeth and/or dentures?: No Does patient usually wear dentures?: No  CIWA:  CIWA-Ar Total: 0 COWS:  COWS Total Score: 1  Musculoskeletal: Strength & Muscle Tone: within normal limits Gait & Station: normal Patient leans: N/A  Psychiatric Specialty Exam: Physical Exam  Nursing note and vitals reviewed. Constitutional: She is oriented to person, place, and time. She appears well-developed and well-nourished.  HENT:  Head: Normocephalic and atraumatic.  Respiratory: Effort normal.  Neurological: She is alert and oriented to person, place, and time.    Review of Systems  Blood pressure (!) 182/107, pulse (!) 57, temperature 98.1 F (36.7 C), temperature source Oral, resp. rate 20, SpO2 100 %.There is no height or weight on file to calculate BMI.  General Appearance: Casual  Eye Contact:  Fair  Speech:  Normal Rate  Volume:  Normal  Mood:  Anxious and Dysphoric  Affect:  Congruent  Thought Process:  Goal Directed and Descriptions of Associations: Loose  Orientation:  Full (Time, Place,  and Person)  Thought Content:  Delusions and Hallucinations: Auditory  Suicidal Thoughts:  No  Homicidal Thoughts:  No  Memory:  Immediate;   Fair Recent;   Fair Remote;   Fair  Judgement:  Intact  Insight:  Fair  Psychomotor Activity:  Normal  Concentration:  Concentration: Fair and Attention Span: Fair  Recall:  [employer] of Knowledge:  Fair  Language:  Good  Akathisia:  Negative  Handed:  Right  AIMS (if indicated):     Assets:  Desire for Improvement Resilience  ADL's:  Intact  Cognition:  Impaired,  Mild  Sleep:  Number of Hours: 4.75      Treatment Plan Summary: Daily contact with patient to assess and evaluate symptoms and progress in treatment, Medication management and Plan : Patient is seen and examined.  Patient is a 59 year old female with the above-stated past psychiatric history who is seen in follow-up.   Diagnosis: #1 bipolar disorder type I, most recently mixed with psychotic features, #2 alcohol dependence in remission, #3 essential hypertension, #4 cerebrovascular disease, #5 COPD  Patient is seen in follow-up.  Patient continues to have psychotic symptoms.  She has a previous history of psychosis with bipolar disorder.  She is currently on perphenazine 4 mg p.o. daily and 6 mg p.o. nightly, but she is not sleeping and she continues to have psychotic symptoms.  She was previously treated with Zyprexa as well as Seroquel.  She also describes some manic behaviors occurring after being treated with BuSpar.  She has been started on donepezil as well as memantine for mild cognitive impairment.  Unfortunately it looks like her brain suggest cerebrovascular disease, and unfortunately these medications would not be of any benefit in that type of dementia.  She is not currently on any mood stabilizers.  She is currently on antidepressant medications in the form of Trintellix.  Review of the electronic medical record revealed that she apparently had improvement with a combination of Depakote as well as Zyprexa.  Unfortunately we do not have any current laboratories on her.  I am going to reorder her lab work for DM.  I am also going to order an EKG.  We need to increase her antihypertensive treatment, but I will see what kind of rhythm were dealing with first.  She admitted that she had previously abused Xanax in the past, but it does not sound like anything recently.  The PMP database shows her last Xanax prescription was on 04/21/2018.  I will try and get these things done so we can adjust her medications today.  I am going to stop  her BuSpar, and I am going to reduce the Trintellix in the meantime. #1 continue albuterol HFA 2 puffs every 6 hours as needed wheezing or shortness of breath. 2.  Stop BuSpar. 3.  Continue carvedilol 25 mg p.o. twice daily for hypertension. 3.  Continue donepezil 10 mg p.o. nightly for mild cognitive impairment. 4.  Continue hydroxyzine 25 mg p.o. 3 times daily as needed anxiety. 5.  Continue lisinopril 20 mg p.o. daily for hypertension. 6.  Continue Namenda 10 mg p.o. twice daily for cognitive impairment. 7.  Continue Protonix 20 mg p.o. daily for reflux disease. 8.  Continue Trilafon 4 mg p.o. daily and 6 mg p.o. nightly for psychosis. 9.  Continue multivitamin 1 tablet p.o. daily for nutritional supplementation. 10.  Continue Anoro Ellipta for COPD. 11.  Hold Trintellix for now. 12.  CBC with differential, CMP, TSH urinalysis today. 13.  EKG today. 14.  Disposition planning-in progress.  Antonieta Pert, MD 10/23/2019, 11:44 AM

## 2019-10-23 NOTE — [facility] Group Notes (Signed)
  [facility] LCSW Group Therapy Note  Date/Time:  10/23/2019 11:15AM-12:00PM  Type of Therapy and Topic:  Group Therapy:  Feelings About Hospitalization  Participation Level:  Active   Description of Group This process group involved patients discussing their feelings related to being hospitalized, as well as the benefits they see to being in the hospital.  These feelings and benefits were itemized.  The group then brainstormed specific ways in which they could seek those same benefits when they discharge and return home.  Therapeutic Goals 1. Patient will identify and describe positive and negative feelings related to hospitalization 2. Patient will verbalize benefits of hospitalization to themselves personally 3. Patients will brainstorm together ways they can obtain similar benefits in the outpatient setting, identify barriers to wellness and possible solutions  Summary of Patient Progress:  The patient actively engaged in opening discussions surrounding current feelings, sharing of feeling relaxed. Pt further engaged in group discussions and actively expressed her primary feelings about being hospitalized are being glad to know there are others like her, feeling safe, and welcomed, while also feeling that treatment providers never actually hear what she is saying she is feeling regarding the voices in her head controlling negative energy and inflicting pain on her. Pt actively engaged in exploring the benefits that can be received in an outpatient setting, detailing therapists being able to assist in understanding and working through her thoughts, feelings, and all that she is dealing with. Pt proved receptive to feedback from alternate group members and group facilitator.  Therapeutic Modalities Cognitive Behavioral Therapy Motivational Interviewing    Cyril Loosen, LCSWA 10/23/2019, 12:42 PM

## 2019-10-23 NOTE — Progress Notes (Signed)
   10/23/19 2030  COVID-19 Daily Checkoff  Have you had a fever (temp > 37.80C/100F)  in the past 24 hours?  No  If you have had runny nose, nasal congestion, sneezing in the past 24 hours, has it worsened? No  COVID-19 EXPOSURE  Have you traveled outside the state in the past 14 days? No  Have you been in contact with someone with a confirmed diagnosis of COVID-19 or PUI in the past 14 days without wearing appropriate PPE? No  Have you been living in the same home as a person with confirmed diagnosis of COVID-19 or a PUI (household contact)? No  Have you been diagnosed with COVID-19? No

## 2019-10-24 MED ORDER — LOSARTAN POTASSIUM 50 MG PO TABS
50.0000 mg | ORAL_TABLET | Freq: Every day | ORAL | Status: DC
  Administered 2019-10-24 – 2019-10-25 (×2): 50 mg via ORAL
  Filled 2019-10-24 (×4): qty 1

## 2019-10-24 MED ORDER — PERPHENAZINE 16 MG PO TABS
16.0000 mg | ORAL_TABLET | Freq: Every day | ORAL | Status: DC
  Administered 2019-10-24: 20:00:00 16 mg via ORAL
  Filled 2019-10-24 (×2): qty 1

## 2019-10-24 MED ORDER — LISINOPRIL 40 MG PO TABS
40.0000 mg | ORAL_TABLET | Freq: Every day | ORAL | Status: DC
  Filled 2019-10-24: qty 1

## 2019-10-24 MED ORDER — PERPHENAZINE 8 MG PO TABS
8.0000 mg | ORAL_TABLET | Freq: Every day | ORAL | Status: DC
  Filled 2019-10-24: qty 1

## 2019-10-24 NOTE — Progress Notes (Signed)
Patient has been up in the dayroom tonight watching tv but no interaction with peers. Writer spoke with her 1:1 and she reported that her day has been good. We spoke about her not sleeping well the previous night and she reported that the doctor informed her that he would take a look at her medications. She was compliant with her medications and no other issues were brought to writers attention from her. Safety maintained with 15 min checks.

## 2019-10-24 NOTE — Progress Notes (Signed)
   10/24/19 2110  COVID-19 Daily Checkoff  Have you had a fever (temp > 37.80C/100F)  in the past 24 hours?  No  If you have had runny nose, nasal congestion, sneezing in the past 24 hours, has it worsened? No  COVID-19 EXPOSURE  Have you traveled outside the state in the past 14 days? No  Have you been in contact with someone with a confirmed diagnosis of COVID-19 or PUI in the past 14 days without wearing appropriate PPE? No  Have you been living in the same home as a person with confirmed diagnosis of COVID-19 or a PUI (household contact)? No  Have you been diagnosed with COVID-19? No

## 2019-10-24 NOTE — Progress Notes (Signed)
   10/24/19 1100  Psych Admission Type (Psych Patients Only)  Admission Status Voluntary  Psychosocial Assessment  Patient Complaints Depression  Eye Contact Fair  Facial Expression Anxious  Affect Anxious;Appropriate to circumstance  Speech Logical/coherent  Interaction Assertive  Motor Activity Slow  Appearance/Hygiene Unremarkable  Behavior Characteristics Cooperative;Appropriate to situation  Mood Depressed;Pleasant  Aggressive Behavior  Effect No apparent injury  Thought Process  Coherency WDL  Content WDL  Delusions None reported or observed  Perception Hallucinations  Hallucination Auditory  Judgment Poor  Confusion None  Danger to Self  Current suicidal ideation? Denies  Danger to Others  Danger to Others None reported or observed

## 2019-10-24 NOTE — Progress Notes (Signed)
[place] NOVEL CORONAVIRUS (COVID-19) DAILY CHECK-OFF SYMPTOMS - answer yes or no to each - every day NO YES  Have you had a fever in the past 24 hours?  . Fever (Temp > 37.80C / 100F) X   Have you had any of these symptoms in the past 24 hours? . New Cough .  Sore Throat  .  Shortness of Breath .  Difficulty Breathing .  Unexplained Body Aches   X   Have you had any one of these symptoms in the past 24 hours not related to allergies?   . Runny Nose .  Nasal Congestion .  Sneezing   X   If you have had runny nose, nasal congestion, sneezing in the past 24 hours, has it worsened?  X   EXPOSURES - check yes or no X   Have you traveled outside the state in the past 14 days?  X   Have you been in contact with someone with a confirmed diagnosis of COVID-19 or PUI in the past 14 days without wearing appropriate PPE?  X   Have you been living in the same home as a person with confirmed diagnosis of COVID-19 or a PUI (household contact)?    X   Have you been diagnosed with COVID-19?    X              What to do next: Answered NO to all: Answered YES to anything:   Proceed with unit schedule Follow the [facility] Inpatient Flowsheet.   

## 2019-10-24 NOTE — Progress Notes (Signed)
   10/23/19 2030  Psych Admission Type (Psych Patients Only)  Admission Status Voluntary  Psychosocial Assessment  Patient Complaints Depression  Eye Contact Fair  Facial Expression Anxious;Worried  Affect Anxious;Appropriate to circumstance;Preoccupied  Speech Logical/coherent  Interaction Assertive  Motor Activity Slow  Appearance/Hygiene Unremarkable  Behavior Characteristics Appropriate to situation;Cooperative  Mood Depressed;Pleasant  Aggressive Behavior  Effect No apparent injury  Thought Process  Coherency WDL  Content WDL  Delusions None reported or observed  Perception Hallucinations  Hallucination Auditory  Judgment Poor  Confusion None  Danger to Self  Current suicidal ideation? Denies  Danger to Others  Danger to Others None reported or observed

## 2019-10-24 NOTE — [facility] Group Notes (Signed)
[facility] LCSW Group Therapy Note  Date/Time:  10/24/2019 11:15-12:00PM  Type of Therapy and Topic:  Group Therapy:  Healthy and Unhealthy Supports  Participation Level:  Active   Description of Group:  Patients in this group were introduced to the idea of adding a variety of healthy supports to address the various needs in their lives.Patients discussed what additional healthy supports could be helpful in their recovery and wellness after discharge in order to prevent future hospitalizations.   An emphasis was placed on using counselor, doctor, therapy groups, 12-step groups, and problem-specific support groups to expand supports.  They also worked as a group on developing a specific plan for several patients to deal with unhealthy supports through boundary-setting, psychoeducation with loved ones, and even termination of relationships.   Therapeutic Goals:   1)  discuss importance of adding supports to stay well once out of the hospital  2)  compare healthy versus unhealthy supports and identify some examples of each  3)  generate ideas and descriptions of healthy supports that can be added  4)  offer mutual support about how to address unhealthy supports  5)  encourage active participation in and adherence to discharge plan    Summary of Patient Progress:  The patient actively engaged in opening group check-in, sharing of feeling "comfortably numb". The pt actively engaged in exploration of her understanding of the word support, and what that means to her. Pt shared her understanding of supports are those such as her family, therapists, doctors, and everyone that is helping her to be her. Pt actively engaged in discussion of the comparison between healthy and unhealthy supports, proving receptive to other group members discussion points. Pt actively engaged in processing positive supports she could add, sharing of wanting to strengthen her relationship with god, family members, and  allow her doctors and therapists to assist in her treatment process. Pt proved able to identify that she herself has to be a support of her own in order to allow herself to receive the support from others. Pt proved open and receptive to feedback from group members and group facilitator.   Therapeutic Modalities:   Motivational Interviewing Brief Solution-Focused Therapy  Leisa Lenz, LCSWA 10/24/2019 2:10PM

## 2019-10-24 NOTE — Progress Notes (Signed)
[facility] MD Progress Note  10/24/2019 10:55 AM Michaela CERVENY  MRN:  [id number] Subjective:  Patient is a 59 year old female who presented to the [facility] health hospital as a walk-in on 10/19/2019.  She complained of worsening anxiety and auditory hallucinations and paranoia.  Objective: Patient is seen and examined.  Patient is a 59 year old female with a reported past psychiatric history significant for a previous diagnosis of schizophrenia, alcohol dependence in remission as well as cerebrovascular disease by MRI.  She is seen in follow-up.  Patient's only complaint today was that her sleep is still not great.  Nursing notes report that she only slept 4.5 hours last night.  She stated her auditory hallucinations have decreased.  She stated now there are just "a whisper".  She denied any suicidal or homicidal ideation.  We discussed the results of her MRI, and I explained to her that the changes are most likely secondary to her hypertension as well as her smoking.  She stated her mood was good, and she denied any suicidal ideation.  She did state that she had previously taken lisinopril for hypertension.  Review of her laboratories revealed a mildly elevated glucose of 116, a creatinine that was essentially normal at 1.04.  Her GFR was 59.  Liver function enzymes were normal.  CBC was normal.  Differential was normal.  TSH was elevated but normal at 3.388.  Urinalysis was essentially normal.  Her EKG showed's sinus bradycardia with a normal QTc interval.  Principal Problem: <principal problem not specified> Diagnosis: Active Problems:   MDD (major depressive disorder)   Psychosis (HCC)  Total Time spent with patient: 20 minutes  Past Psychiatric History: See admission H&P  Past Medical History:  Past Medical History:  Diagnosis Date  . Bronchial asthma   . Chicken pox   . COPD (chronic obstructive pulmonary disease) (HCC)   . Depression   . Frequent headaches   . GERD (gastroesophageal reflux  disease)   . UTI (lower urinary tract infection)     Past Surgical History:  Procedure Laterality Date  . ESOPHAGEAL DILATION    . TONSILLECTOMY     Family History:  Family History  Problem Relation Age of Onset  . Other Mother        MVA-Deceased [pt was age 33]  . COPD Father        Deceased  . Heart attack Maternal Grandmother   . Stroke Maternal Grandmother   . Brain cancer Maternal Grandmother   . Arthritis/Rheumatoid Maternal Grandmother   . Heart attack Maternal Grandfather   . Stroke Maternal Grandfather   . Heart disease Maternal Aunt   . Stroke Maternal Aunt        #1  . Heart attack Maternal Aunt        #1  . Hypertension Maternal Aunt        #1  . Arthritis/Rheumatoid Sister   . COPD Sister   . Heart disease Maternal Uncle   . Heart attack Maternal Uncle        x4  . Heart disease Brother        #1  . Heart attack Brother        #1  . Diabetes Brother   . Diabetes Maternal Aunt   . Mental retardation Maternal Aunt   . Healthy Daughter        x2  . Drug abuse Daughter        #1  . Drug abuse Daughter        #  2  . Kidney Stones Daughter        #2  . Other Son        Agoraphobia   Family Psychiatric  History: See admission H&P Social History:  Social History   Substance and Sexual Activity  Alcohol Use Yes     Social History   Substance and Sexual Activity  Drug Use Not Currently    Social History   Socioeconomic History  . Marital status: Single    Spouse name: Not on file  . Number of children: Not on file  . Years of education: Not on file  . Highest education level: Not on file  Occupational History  . Not on file  Tobacco Use  . Smoking status: Current Every Day Smoker  . Smokeless tobacco: Never Used  Substance and Sexual Activity  . Alcohol use: Yes  . Drug use: Not Currently  . Sexual activity: Not Currently  Other Topics Concern  . Not on file  Social History Narrative  . Not on file   Social Determinants of Health    Financial Resource Strain:   . Difficulty of Paying Living Expenses: Not on file  Food Insecurity:   . Worried About Programme researcher, broadcasting/film/video in the Last Year: Not on file  . Ran Out of Food in the Last Year: Not on file  Transportation Needs:   . Lack of Transportation (Medical): Not on file  . Lack of Transportation (Non-Medical): Not on file  Physical Activity:   . Days of Exercise per Week: Not on file  . Minutes of Exercise per Session: Not on file  Stress:   . Feeling of Stress : Not on file  Social Connections:   . Frequency of Communication with Friends and Family: Not on file  . Frequency of Social Gatherings with Friends and Family: Not on file  . Attends Religious Services: Not on file  . Active Member of Clubs or Organizations: Not on file  . Attends Banker Meetings: Not on file  . Marital Status: Not on file   Additional Social History:    Pain Medications: see MAR Prescriptions: see MAR Over the Counter: see MAR                    Sleep: Poor  Appetite:  Fair  Current Medications: Current Facility-Administered Medications  Medication Dose Route Frequency Provider Last Rate Last Admin  . acetaminophen (TYLENOL) tablet 650 mg  650 mg Oral Q6H PRN Jearld Lesch, NP   650 mg at 10/24/19 0812  . albuterol (VENTOLIN HFA) 108 (90 Base) MCG/ACT inhaler 2 puff  2 puff Inhalation Q6H PRN Malvin Johns, MD   2 puff at 10/23/19 1410  . alum & mag hydroxide-simeth (MAALOX/MYLANTA) 200-200-20 MG/5ML suspension 30 mL  30 mL Oral Q4H PRN Dixon, Rashaun M, NP      . carvedilol (COREG) tablet 25 mg  25 mg Oral BID WC Malvin Johns, MD   25 mg at 10/24/19 3662  . hydrOXYzine (ATARAX/VISTARIL) tablet 25 mg  25 mg Oral TID PRN Jearld Lesch, NP   25 mg at 10/24/19 0205  . [START ON 10/25/2019] lisinopril (ZESTRIL) tablet 40 mg  40 mg Oral Daily Antonieta Pert, MD      . magnesium hydroxide (MILK OF MAGNESIA) suspension 30 mL  30 mL Oral Daily PRN Dixon,  Rashaun M, NP      . omega-3 acid ethyl esters (LOVAZA) capsule 1 g  1 g Oral BID Antonieta Pert, MD   1 g at 10/24/19 0810  . pantoprazole (PROTONIX) EC tablet 20 mg  20 mg Oral Daily Aldean Baker, NP   20 mg at 10/24/19 6644  . perphenazine (TRILAFON) tablet 4 mg  4 mg Oral Daily Malvin Johns, MD   4 mg at 10/24/19 0810  . perphenazine (TRILAFON) tablet 8 mg  8 mg Oral QHS Antonieta Pert, MD      . prenatal multivitamin tablet 1 tablet  1 tablet Oral Q1200 Malvin Johns, MD   1 tablet at 10/23/19 1254  . temazepam (RESTORIL) capsule 15 mg  15 mg Oral QHS Malvin Johns, MD   15 mg at 10/23/19 2132  . umeclidinium-vilanterol (ANORO ELLIPTA) 62.5-25 MCG/INH 1 puff  1 puff Inhalation Daily Malvin Johns, MD   1 puff at 10/24/19 0347    Lab Results:  Results for orders placed or performed during the hospital encounter of 10/19/19 (from the past 48 hour(s))  Urinalysis, Complete w Microscopic     Status: Abnormal   Collection Time: 10/23/19  2:51 PM  Result Value Ref Range   Color, Urine STRAW (A) YELLOW   APPearance CLEAR CLEAR   Specific Gravity, Urine 1.005 1.005 - 1.030   pH 6.0 5.0 - 8.0   Glucose, UA NEGATIVE NEGATIVE mg/dL   Hgb urine dipstick NEGATIVE NEGATIVE   Bilirubin Urine NEGATIVE NEGATIVE   Ketones, ur NEGATIVE NEGATIVE mg/dL   Protein, ur NEGATIVE NEGATIVE mg/dL   Nitrite NEGATIVE NEGATIVE   Leukocytes,Ua NEGATIVE NEGATIVE   WBC, UA 0-5 0 - 5 WBC/hpf   Bacteria, UA NONE SEEN NONE SEEN    Comment: Performed at [facility], [address] [place], [place] [address]  CBC with Differential/Platelet     Status: None   Collection Time: 10/23/19  6:28 PM  Result Value Ref Range   WBC 7.5 4.0 - 10.5 K/uL   RBC 4.32 3.87 - 5.11 MIL/uL   Hemoglobin 13.1 12.0 - 15.0 g/dL   HCT 63.8 75.6 - 43.3 %   MCV 94.9 80.0 - 100.0 fL   MCH 30.3 26.0 - 34.0 pg   MCHC 32.0 30.0 - 36.0 g/dL   RDW 29.5 18.8 - 41.6 %   Platelets 308 150 - 400 K/uL   nRBC 0.0 0.0 -  0.2 %   Neutrophils Relative % 49 %   Neutro Abs 3.7 1.7 - 7.7 K/uL   Lymphocytes Relative 37 %   Lymphs Abs 2.8 0.7 - 4.0 K/uL   Monocytes Relative 10 %   Monocytes Absolute 0.7 0.1 - 1.0 K/uL   Eosinophils Relative 2 %   Eosinophils Absolute 0.2 0.0 - 0.5 K/uL   Basophils Relative 1 %   Basophils Absolute 0.1 0.0 - 0.1 K/uL   Immature Granulocytes 1 %   Abs Immature Granulocytes 0.05 0.00 - 0.07 K/uL    Comment: Performed at [facility], [address], [place], [place] [address]  Comprehensive metabolic panel     Status: Abnormal   Collection Time: 10/23/19  6:28 PM  Result Value Ref Range   Sodium 139 135 - 145 mmol/L   Potassium 3.9 3.5 - 5.1 mmol/L   Chloride 108 98 - 111 mmol/L   CO2 24 22 - 32 mmol/L   Glucose, Bld 116 (H) 70 - 99 mg/dL    Comment: Glucose reference range applies only to samples taken after fasting for at least 8 hours.  BUN 21 (H) 6 - 20 mg/dL   Creatinine, Ser 6.75 (H) 0.44 - 1.00 mg/dL   Calcium 8.6 (L) 8.9 - 10.3 mg/dL   Total Protein 7.4 6.5 - 8.1 g/dL   Albumin 4.2 3.5 - 5.0 g/dL   AST 16 15 - 41 U/L   ALT 15 0 - 44 U/L   Alkaline Phosphatase 55 38 - 126 U/L   Total Bilirubin 0.3 0.3 - 1.2 mg/dL   GFR calc non Af Amer 59 (L) >60 mL/min   GFR calc Af Amer >60 >60 mL/min   Anion gap 7 5 - 15    Comment: Performed at [facility], [address], [place], [place] [address]  TSH     Status: None   Collection Time: 10/23/19  6:28 PM  Result Value Ref Range   TSH 3.388 0.350 - 4.500 uIU/mL    Comment: Performed by a 3rd Generation assay with a functional sensitivity of <=0.01 uIU/mL. Performed at [facility], [address] [place], [place] [address]     Blood Alcohol level:  Lab Results  Component Value Date   ETH <10 05/05/2018   ETH <10 01/04/2018    Metabolic Disorder Labs: Lab Results  Component Value Date   HGBA1C 6.7 (H) 05/06/2018   MPG 145.59 05/06/2018   No results  found for: PROLACTIN Lab Results  Component Value Date   CHOL 203 (H) 05/06/2018   TRIG 73 05/06/2018   HDL 51 05/06/2018   CHOLHDL 4.0 05/06/2018   VLDL 15 05/06/2018   LDLCALC 137 (H) 05/06/2018    Physical Findings: AIMS: Facial and Oral Movements Muscles of Facial Expression: None, normal Lips and Perioral Area: None, normal Jaw: None, normal Tongue: None, normal,Extremity Movements Upper (arms, wrists, hands, fingers): None, normal Lower (legs, knees, ankles, toes): None, normal, Trunk Movements Neck, shoulders, hips: None, normal, Overall Severity Severity of abnormal movements (highest score from questions above): None, normal Incapacitation due to abnormal movements: None, normal Patient's awareness of abnormal movements (rate only patient's report): No Awareness, Dental Status Current problems with teeth and/or dentures?: No Does patient usually wear dentures?: No  CIWA:  CIWA-Ar Total: 0 COWS:  COWS Total Score: 1  Musculoskeletal: Strength & Muscle Tone: within normal limits Gait & Station: normal Patient leans: N/A  Psychiatric Specialty Exam: Physical Exam  Nursing note and vitals reviewed. Constitutional: She is oriented to person, place, and time. She appears well-developed and well-nourished.  HENT:  Head: Normocephalic and atraumatic.  Respiratory: Effort normal.  Neurological: She is alert and oriented to person, place, and time.    Review of Systems  Blood pressure (!) 135/124, pulse 67, temperature (!) 97.5 F (36.4 C), temperature source Oral, resp. rate 20, SpO2 100 %.There is no height or weight on file to calculate BMI.  General Appearance: Casual  Eye Contact:  Fair  Speech:  Normal Rate  Volume:  Normal  Mood:  Anxious  Affect:  Congruent  Thought Process:  Disorganized and Descriptions of Associations: Intact  Orientation:  Full (Time, Place, and Person)  Thought Content:  Hallucinations: Auditory  Suicidal Thoughts:  No  Homicidal  Thoughts:  No  Memory:  Immediate;   Fair Recent;   Fair Remote;   Fair  Judgement:  Intact  Insight:  Fair  Psychomotor Activity:  Normal  Concentration:  Concentration: Fair and Attention Span: Fair  Recall:  [employer] of Knowledge:  Fair  Language:  Good  Akathisia:  Negative  Handed:  Right  AIMS (if indicated):     Assets:  Desire for Improvement Resilience  ADL's:  Intact  Cognition:  WNL  Sleep:  Number of Hours: 4.5     Treatment Plan Summary: Daily contact with patient to assess and evaluate symptoms and progress in treatment, Medication management and Plan : Patient is seen and examined.  Patient is a 59 year old female with the above-stated past psychiatric history who is seen in follow-up.   Diagnosis: #1 bipolar disorder type I, most recently mixed with psychotic features, #2 alcohol dependence in remission, #3 essential hypertension, #4 cerebrovascular disease, #5 COPD  Patient is seen in follow-up.  Her psychotic symptoms have decreased mildly.  She still hearing whispers.  She stated her mood is good.  Her blood pressure remains mildly elevated.  Her MRIs showed cerebrovascular disease.  Her laboratories were all essentially normal.  I stop the Aricept and Namenda yesterday.  On her Mini-Mental status examination she did not really show any evidence of mild cognitive impairment or dementia.  There is no evidence that Aricept or Namenda is beneficial in mild cognitive impairment as well.  I did start her on omega-3 fatty acids given her cerebrovascular disease.  She is still not sleeping well, and continues on Trilafon 4 mg p.o. daily and 6 mg p.o. nightly.  I will increase her Trilafon to 4 mg p.o. daily and increase to 16 mg p.o. nightly.  I am quite concerned about the cost issue was related to the Trintellix, and I am going to hold that for now.  With regard to her blood pressure she is on Coreg 25 mg p.o. twice daily and lisinopril 40 mg p.o. daily.  She is  bradycardic, so increasing the carvedilol is not an option.  I am going to stop her lisinopril and place her on losartan.  We will give her 50 mg p.o. daily today, and see how that goes.  It may have to be increased to 100 mg p.o. daily.  I will order a metabolic panel on her for 10/26/2019 to make sure that there is no inhibition of her renal function.  Hopefully these changes will improve her sleep, situation and her blood pressure control.  1.  Continue albuterol 2 puffs every 6 hours as needed wheezing. 2.  Continue carvedilol 25 mg p.o. twice daily for hypertension. 3.  Continue hydroxyzine 25 mg p.o. 3 times daily as needed anxiety. 4.  Stop lisinopril. 5.  Start losartan 50 mg p.o. daily for hypertension. 6.  Continue omega-3 fatty acids for neuro protection and to decrease cerebral vascular inflammation. 7.  Continue Protonix 20 mg p.o. daily for reflux disease. 8.  Increase Trilafon to 4 mg p.o. daily and 60 mg p.o. nightly for psychosis. 9.  Continue a prenatal vitamin 1 tablet p.o. daily for nutritional supplementation. 10.  Change temazepam to 15 mg p.o. nightly as needed insomnia. 51.  Hold Trintellix for now given fears of cost issues in obtaining medications. 12.  Continue Anoro Ellipta inhalations for COPD. 13.  EKG tomorrow to assess any changes in QTc interval with increase in Trilafon. 14.  Basic metabolic panel on 6/0/6301 since changing antihypertensive medications. 15.  Disposition planning-in progress. Sharma Covert, MD 10/24/2019, 10:55 AM

## 2019-10-25 MED ORDER — CARVEDILOL 25 MG PO TABS: 25.0000 mg | ORAL_TABLET | Freq: Two times a day (BID) | ORAL | 2 refills | Status: DC

## 2019-10-25 MED ORDER — OMEGA-3-ACID ETHYL ESTERS 1 G PO CAPS: 1.0000 g | ORAL_CAPSULE | Freq: Two times a day (BID) | ORAL | 5 refills | Status: DC

## 2019-10-25 MED ORDER — LOSARTAN POTASSIUM 50 MG PO TABS: 50.0000 mg | ORAL_TABLET | Freq: Every day | ORAL | 1 refills | Status: DC

## 2019-10-25 MED ORDER — PERPHENAZINE 4 MG PO TABS: ORAL_TABLET | ORAL | 2 refills | Status: DC

## 2019-10-25 MED ORDER — BENZTROPINE MESYLATE 1 MG PO TABS: 1.0000 mg | ORAL_TABLET | Freq: Two times a day (BID) | ORAL | 2 refills | Status: DC

## 2019-10-25 MED ORDER — PRENATAL MULTIVITAMIN CH: 1.0000 | ORAL_TABLET | Freq: Every day | ORAL | 1 refills | Status: DC

## 2019-10-25 MED ORDER — TEMAZEPAM 15 MG PO CAPS: 15.0000 mg | ORAL_CAPSULE | Freq: Every day | ORAL | 2 refills | Status: DC

## 2019-10-25 NOTE — BHH Suicide Risk Assessment (Signed)
[facility] Discharge Suicide Risk Assessment   Principal Problem: Chronic small vessel ischemic changes depression and history of alcohol dependence/psychosis Discharge Diagnoses: Active Problems:   MDD (major depressive disorder)   Psychosis (HCC)   Total Time spent with patient: 20 minutes  Musculoskeletal: Strength & Muscle Tone: within normal limits Gait & Station: normal Patient leans: N/A  Psychiatric Specialty Exam: Review of Systems  Blood pressure 113/86, pulse 70, temperature 97.7 F (36.5 C), temperature source Oral, resp. rate 20, SpO2 100 %.There is no height or weight on file to calculate BMI.  General Appearance: Casual  Eye Contact::  Good  Speech:  Clear and Coherent409  Volume:  Normal  Mood:  Euthymic  Affect:  Full Range  Thought Process:  Goal Directed hallucinations reduced to a whisper  Orientation:  Full (Time, Place, and Person)  Thought Content:  Rumination  Suicidal Thoughts:  No  Homicidal Thoughts:  No  Memory:  Immediate;   Fair Recent;   Fair Remote;   Fair  Judgement:  Fair  Insight:  Fair  Psychomotor Activity:  Normal  Concentration:  Fair  Recall:  [employer] of Knowledge:Fair  Language: Fair  Akathisia:  Negative  Handed:  Right  AIMS (if indicated):     Assets:  Communication Skills Desire for Improvement Housing Intimacy Leisure Time  Sleep:  Number of Hours: 5.75  Cognition: WNL  ADL's:  Intact   Mental Status Per Nursing Assessment::   On Admission:  NA  Demographic Factors:  Unemployed  Loss Factors: Decrease in vocational status  Historical Factors: NA  Risk Reduction Factors:   Religious beliefs about death  Continued Clinical Symptoms:  Previous Psychiatric Diagnoses and Treatments Medical Diagnoses and Treatments/Surgeries  Cognitive Features That Contribute To Risk:  None    Suicide Risk:  Minimal: No identifiable suicidal ideation.  Patients presenting with no risk factors but with morbid ruminations;  may be classified as minimal risk based on the severity of the depressive symptoms  Follow-up Information    [facility] Follow up on 10/29/2019.   Why: You are scheduled for an appointment on 10/29/19 at 10:00 am.  This is a Heritage manager appointment.  Please have your insurance information and your discharge summary available.  Contact information: [address] [place] [address] [phone]           Plan Of Care/Follow-up recommendations:  Activity:  full  Kobi Aller, MD 10/25/2019, 8:56 AM

## 2019-10-25 NOTE — Progress Notes (Signed)
  [facility] Adult Case Management Discharge Plan :  Will you be returning to the same living situation after discharge:  Yes,  home. At discharge, do you have transportation home?: Yes,  daughter will pick up at 10:30am Do you have the ability to pay for your medications: No. Referred to [facility].  Release of information consent forms completed and in the chart.  Patient to Follow up at: Follow-up Information    [facility] Follow up on 10/29/2019.   Why: You are scheduled for an appointment on 10/29/19 at 10:00 am.  This is a Heritage manager appointment.  Please have your insurance information and your discharge summary available.  Contact information: [address] [place] [address] [phone]           Next level of care provider has access to [facility] Link:no  Safety Planning and Suicide Prevention discussed: Yes,  with daughter.  Has patient been referred to the Quitline?: Patient refused referral  Patient has been referred for addiction treatment: Yes  Darreld Mclean, LCSWA 10/25/2019, 9:34 AM

## 2019-10-25 NOTE — Plan of Care (Signed)
Discharge note  Patient verbalizes readiness for discharge. Follow up plan explained, AVS, Transition record and SRA given. Prescriptions and teaching provided. Belongings returned and signed for. Suicide safety plan completed and signed. Patient verbalizes understanding. Patient denies SI/HI and assures this Clinical research associate they will seek assistance should that change. Patient discharged to lobby to wait for daughter.  Problem: Coping: Goal: Ability to identify and develop effective coping behavior will improve Outcome: Adequate for Discharge   Problem: Activity: Goal: Interest or engagement in leisure activities will improve Outcome: Adequate for Discharge Goal: Imbalance in normal sleep/wake cycle will improve Outcome: Adequate for Discharge   Problem: Education: Goal: Knowledge of [facility] Education information/materials will improve Outcome: Adequate for Discharge Goal: Emotional status will improve Outcome: Adequate for Discharge Goal: Mental status will improve Outcome: Adequate for Discharge Goal: Verbalization of understanding the information provided will improve Outcome: Adequate for Discharge   Problem: Activity: Goal: Interest or engagement in activities will improve Outcome: Adequate for Discharge Goal: Sleeping patterns will improve Outcome: Adequate for Discharge   Problem: Coping: Goal: Ability to verbalize frustrations and anger appropriately will improve Outcome: Adequate for Discharge Goal: Ability to demonstrate self-control will improve Outcome: Adequate for Discharge   Problem: Health Behavior/Discharge Planning: Goal: Identification of resources available to assist in meeting health care needs will improve Outcome: Adequate for Discharge Goal: Compliance with treatment plan for underlying cause of condition will improve Outcome: Adequate for Discharge   Problem: Physical Regulation: Goal: Ability to maintain clinical measurements within normal  limits will improve Outcome: Adequate for Discharge   Problem: Safety: Goal: Periods of time without injury will increase Outcome: Adequate for Discharge   Problem: Education: Goal: Ability to make informed decisions regarding treatment will improve Outcome: Adequate for Discharge   Problem: Coping: Goal: Coping ability will improve Outcome: Adequate for Discharge   Problem: Health Behavior/Discharge Planning: Goal: Identification of resources available to assist in meeting health care needs will improve Outcome: Adequate for Discharge   Problem: Medication: Goal: Compliance with prescribed medication regimen will improve Outcome: Adequate for Discharge   Problem: Self-Concept: Goal: Ability to disclose and discuss suicidal ideas will improve Outcome: Adequate for Discharge Goal: Will verbalize positive feelings about self Outcome: Adequate for Discharge

## 2019-10-25 NOTE — Progress Notes (Signed)
Recreation Therapy Notes  Date: 3.1.21 Time: 1000 Location: [address] Dayroom  Group Topic: Coping Skills  Goal Area(s) Addresses:  Patient will be able to identify positive coping skills. Patient will be able to identify benefit of using coping skills post d/c.   Behavioral Response:  Minimal  Intervention: Worksheet, pencils  Activity: Orthoptist.  Patients were to identify the things that have them stuck and write them inside the web.  Patients would then identify at least two coping skills for each problem identified and written on the outside of the web.  Education: Pharmacologist, Building control surveyor.   Education Outcome: Acknowledges understanding/In group clarification offered/Needs additional education.   Clinical Observations/Feedback: Pt was waiting for discharge.  Pt identified her main coping skill as breathing.     Michaela Wells, LRT/CTRS    Michaela Wells A 10/25/2019 11:17 AM

## 2019-10-25 NOTE — Discharge Summary (Signed)
Physician Discharge Summary Note  Patient:  Michaela Wells is an 59 y.o., female MRN:  [id number] DOB:  [birth year] Patient phone:  [phone] (home)  Patient address:   [address] [place] [address],  Total Time spent with patient: 45 minutes  Date of Admission:  10/19/2019 Date of Discharge: 10/25/2019  Reason for Admission:   Per MEQ:ASTMHD L Moxleyis an 59 y.o.femalepresenting as a walk-in to [facility] with complaints of worsening anxiety and delusions of thinking neighbors are in her head. Patient reported she feels there energy in her head. Patient reported living in [place] and stating the mental health hospitals in [place] are not good and the ones in [place] are better. Patient reported being off medications for 2 weeks.  Patient stated that she has been feeling like she as been having a panic attack and cant seem to 'keep it together".  "I know this sound crazy but, my neighbors are living in my head and I feel them trying to control me".  Patient admits to being off of her medications in excess of two weeks. She stated that she just got her prescription filled and have begun taking them today.  She says she has  been having chest, head, and stomach pans as a result of her anxiety.    Recommend overnight observation and reassessment in the am by psychiatry to assess the appropriateness of a possible inpatient hospitalization.  This is what the patient is requesting.     Principal Problem: New onset psychosis and extreme anxiety Discharge Diagnoses: Active Problems:   MDD (major depressive disorder)   Psychosis (HCC)   Past Psychiatric History: History of alcohol dependency  Past Medical History:  Past Medical History:  Diagnosis Date  . Bronchial asthma   . Chicken pox   . COPD (chronic obstructive pulmonary disease) (HCC)   . Depression   . Frequent headaches   . GERD (gastroesophageal reflux disease)   . UTI (lower urinary tract infection)      Past Surgical History:  Procedure Laterality Date  . ESOPHAGEAL DILATION    . TONSILLECTOMY     Family History:  Family History  Problem Relation Age of Onset  . Other Mother        MVA-Deceased [pt was age 68]  . COPD Father        Deceased  . Heart attack Maternal Grandmother   . Stroke Maternal Grandmother   . Brain cancer Maternal Grandmother   . Arthritis/Rheumatoid Maternal Grandmother   . Heart attack Maternal Grandfather   . Stroke Maternal Grandfather   . Heart disease Maternal Aunt   . Stroke Maternal Aunt        #1  . Heart attack Maternal Aunt        #1  . Hypertension Maternal Aunt        #1  . Arthritis/Rheumatoid Sister   . COPD Sister   . Heart disease Maternal Uncle   . Heart attack Maternal Uncle        x4  . Heart disease Brother        #1  . Heart attack Brother        #1  . Diabetes Brother   . Diabetes Maternal Aunt   . Mental retardation Maternal Aunt   . Healthy Daughter        x2  . Drug abuse Daughter        #1  . Drug abuse Daughter        #  2  . Kidney Stones Daughter        #2  . Other Son        Agoraphobia   Family Psychiatric  History: Patient denied Social History:  Social History   Substance and Sexual Activity  Alcohol Use Yes     Social History   Substance and Sexual Activity  Drug Use Not Currently    Social History   Socioeconomic History  . Marital status: Single    Spouse name: Not on file  . Number of children: Not on file  . Years of education: Not on file  . Highest education level: Not on file  Occupational History  . Not on file  Tobacco Use  . Smoking status: Current Every Day Smoker  . Smokeless tobacco: Never Used  Substance and Sexual Activity  . Alcohol use: Yes  . Drug use: Not Currently  . Sexual activity: Not Currently  Other Topics Concern  . Not on file  Social History Narrative  . Not on file   Social Determinants of Health   Financial Resource Strain:   . Difficulty of  Paying Living Expenses: Not on file  Food Insecurity:   . Worried About Programme researcher, broadcasting/film/video in the Last Year: Not on file  . Ran Out of Food in the Last Year: Not on file  Transportation Needs:   . Lack of Transportation (Medical): Not on file  . Lack of Transportation (Non-Medical): Not on file  Physical Activity:   . Days of Exercise per Week: Not on file  . Minutes of Exercise per Session: Not on file  Stress:   . Feeling of Stress : Not on file  Social Connections:   . Frequency of Communication with Friends and Family: Not on file  . Frequency of Social Gatherings with Friends and Family: Not on file  . Attends Religious Services: Not on file  . Active Member of Clubs or Organizations: Not on file  . Attends Banker Meetings: Not on file  . Marital Status: Not on file    Hospital Course:    Meeah was admitted under routine precautions and unfortunately her hallucinations persisted for a prolonged period of time despite antipsychotic therapy however they did diminish to "whispers" by the time of discharge with the medications listed below.  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  At the point of discharge though she stable minimal hallucinations no thoughts of harming self or others and mood is euthymic  Physical Findings: AIMS: Facial and Oral Movements Muscles of Facial Expression: None, normal Lips and Perioral Area: None, normal Jaw: None, normal Tongue: None, normal,Extremity Movements Upper (arms, wrists, hands, fingers): None, normal Lower (legs, knees, ankles, toes): None, normal, Trunk Movements Neck, shoulders, hips: None, normal, Overall Severity Severity of abnormal movements (highest score  from questions above): None, normal Incapacitation due to abnormal movements: None, normal Patient's awareness of abnormal movements (rate only patient's report): No Awareness, Dental Status Current problems with teeth and/or dentures?: No Does patient usually wear dentures?: Yes  CIWA:  CIWA-Ar Total: 0 COWS:  COWS Total Score: 1  Musculoskeletal: Strength & Muscle Tone: within normal limits Gait & Station: normal Patient leans: N/A  Psychiatric Specialty Exam: Review of Systems  Blood pressure  113/86, pulse 70, temperature 97.7 F (36.5 C), temperature source Oral, resp. rate 20, SpO2 100 %.There is no height or weight on file to calculate BMI.  General Appearance: Casual  Eye Contact::  Good  Speech:  Clear and Coherent409  Volume:  Normal  Mood:  Euthymic  Affect:  Full Range  Thought Process:  Goal Directed hallucinations reduced to a whisper  Orientation:  Full (Time, Place, and Person)  Thought Content:  Rumination  Suicidal Thoughts:  No  Homicidal Thoughts:  No  Memory:  Immediate;   Fair Recent;   Fair Remote;   Fair  Judgement:  Fair  Insight:  Fair  Psychomotor Activity:  Normal  Concentration:  Fair  Recall:  [place]  Language: Fair  Akathisia:  Negative  Handed:  Right  AIMS (if indicated):     Assets:  Communication Skills Desire for Improvement Housing Intimacy Leisure Time  Sleep:  Number of Hours: 5.75  Cognition: WNL  ADL's:  Intact       Has this patient used any form of tobacco in the last 30 days? (Cigarettes, Smokeless Tobacco, Cigars, and/or Pipes) Yes, No  Blood Alcohol level:  Lab Results  Component Value Date   ETH <10 05/05/2018   ETH <10 78/29/5621    Metabolic Disorder Labs:  Lab Results  Component Value Date   HGBA1C 6.7 (H) 05/06/2018   MPG 145.59 05/06/2018   No results found for: PROLACTIN Lab Results  Component Value Date   CHOL 203 (H) 05/06/2018   TRIG 73 05/06/2018   HDL 51 05/06/2018    CHOLHDL 4.0 05/06/2018   VLDL 15 05/06/2018   LDLCALC 137 (H) 05/06/2018    See Psychiatric Specialty Exam and Suicide Risk Assessment completed by Attending Physician prior to discharge.  Discharge destination:  Home  Is patient on multiple antipsychotic therapies at discharge:  No   Has Patient had three or more failed trials of antipsychotic monotherapy by history:  No  Recommended Plan for Multiple Antipsychotic Therapies: NA   Allergies as of 10/25/2019      Reactions   Penicillins Other (See Comments)   Childhood allergy Has patient had a PCN reaction causing immediate rash, facial/tongue/throat swelling, SOB or lightheadedness with hypotension: Unknown Has patient had a PCN reaction causing severe rash involving mucus membranes or skin necrosis: Unknown Has patient had a PCN reaction that required hospitalization: Unknown Has patient had a PCN reaction occurring within the last 10 years: Unknown If all of the above answers are "NO", then may proceed with Cephalosporin use.        Medication List    STOP taking these medications   divalproex 250 MG DR tablet Commonly known as: DEPAKOTE   hydrOXYzine 50 MG tablet Commonly known as: ATARAX/VISTARIL   lisinopril 20 MG tablet Commonly known as: ZESTRIL   OLANZapine 10 MG tablet Commonly known as: ZYPREXA   QUEtiapine 100 MG tablet Commonly known as: SEROQUEL   traZODone 50 MG tablet Commonly known as: DESYREL     TAKE these medications     Indication  albuterol 108 (90 Base) MCG/ACT inhaler Commonly known as: VENTOLIN HFA Inhale 1 puff into the lungs every 4 (four) hours as needed for wheezing or shortness of breath. Ventolin  Indication: Asthma   benztropine 1 MG tablet Commonly known as: COGENTIN Take 1 tablet (1 mg total) by mouth 2 (two) times daily.  Indication: Extrapyramidal Reaction caused by Medications   carvedilol 25 MG tablet  Commonly known as: COREG Take 1 tablet (25 mg total) by mouth 2  (two) times daily with a meal.  Indication: High Blood Pressure Disorder   fluticasone 50 MCG/ACT nasal spray Commonly known as: FLONASE Place 2 sprays into both nostrils daily.  Indication: Signs and Symptoms of Nose Diseases   losartan 50 MG tablet Commonly known as: COZAAR Take 1 tablet (50 mg total) by mouth daily. Start taking on: October 26, 2019  Indication: High Blood Pressure Disorder   nicotine 21 mg/24hr patch Commonly known as: NICODERM CQ - dosed in mg/24 hours Place 1 patch (21 mg total) onto the skin daily.  Indication: Nicotine Addiction   omega-3 acid ethyl esters 1 g capsule Commonly known as: LOVAZA Take 1 capsule (1 g total) by mouth 2 (two) times daily.  Indication: High Amount of Triglycerides in the Blood   pantoprazole 20 MG tablet Commonly known as: PROTONIX Take 20 mg by mouth daily.  Indication: Indigestion   perphenazine 4 MG tablet Commonly known as: TRILAFON 1 in am 4 at hs  Indication: Psychosis   prenatal multivitamin Tabs tablet Take 1 tablet by mouth daily at 12 noon.  Indication: Vitamin Deficiency   temazepam 15 MG capsule Commonly known as: RESTORIL Take 1 capsule (15 mg total) by mouth at bedtime.  Indication: Trouble Sleeping   umeclidinium-vilanterol 62.5-25 MCG/INH Aepb Commonly known as: Anoro Ellipta Inhale 1 puff into the lungs daily.  Indication: Chronic Obstructive Lung Disease      Follow-up Information    [facility] Follow up on 10/29/2019.   Why: You are scheduled for an appointment on 10/29/19 at 10:00 am.  This is a [facility] manager appointment.  Please have your insurance information and your discharge summary available.  Contact information: [address] [place] [address] [phone]          SignedMalvin Johns, MD 10/25/2019, 10:50 AM

## 2019-10-25 NOTE — Tx Team (Signed)
Interdisciplinary Treatment and Diagnostic Plan Update  10/25/2019 Time of Session: 9:20am  Michaela Wells MRN: [id number]  Principal Diagnosis: <principal problem not specified>  Secondary Diagnoses: Active Problems:   MDD (major depressive disorder)   Psychosis (HCC)   Current Medications:  Current Facility-Administered Medications  Medication Dose Route Frequency Provider Last Rate Last Admin  . acetaminophen (TYLENOL) tablet 650 mg  650 mg Oral Q6H PRN Jearld Lesch, NP   650 mg at 10/24/19 2110  . albuterol (VENTOLIN HFA) 108 (90 Base) MCG/ACT inhaler 2 puff  2 puff Inhalation Q6H PRN Malvin Johns, MD   2 puff at 10/24/19 1913  . alum & mag hydroxide-simeth (MAALOX/MYLANTA) 200-200-20 MG/5ML suspension 30 mL  30 mL Oral Q4H PRN Dixon, Rashaun M, NP      . carvedilol (COREG) tablet 25 mg  25 mg Oral BID WC Malvin Johns, MD   25 mg at 10/25/19 7829  . hydrOXYzine (ATARAX/VISTARIL) tablet 25 mg  25 mg Oral TID PRN Jearld Lesch, NP   25 mg at 10/24/19 2024  . losartan (COZAAR) tablet 50 mg  50 mg Oral Daily Antonieta Pert, MD   50 mg at 10/25/19 0741  . magnesium hydroxide (MILK OF MAGNESIA) suspension 30 mL  30 mL Oral Daily PRN Dixon, Rashaun M, NP      . omega-3 acid ethyl esters (LOVAZA) capsule 1 g  1 g Oral BID Antonieta Pert, MD   1 g at 10/25/19 0742  . pantoprazole (PROTONIX) EC tablet 20 mg  20 mg Oral Daily Aldean Baker, NP   20 mg at 10/25/19 5621  . perphenazine (TRILAFON) tablet 16 mg  16 mg Oral QHS Antonieta Pert, MD   16 mg at 10/24/19 2025  . perphenazine (TRILAFON) tablet 4 mg  4 mg Oral Daily Malvin Johns, MD   4 mg at 10/25/19 0744  . prenatal multivitamin tablet 1 tablet  1 tablet Oral Q1200 Malvin Johns, MD   1 tablet at 10/24/19 1228  . temazepam (RESTORIL) capsule 15 mg  15 mg Oral QHS Malvin Johns, MD   15 mg at 10/24/19 2024  . umeclidinium-vilanterol (ANORO ELLIPTA) 62.5-25 MCG/INH 1 puff  1 puff Inhalation Daily Malvin Johns, MD   1 puff  at 10/25/19 0741   PTA Medications: Medications Prior to Admission  Medication Sig Dispense Refill Last Dose  . fluticasone (FLONASE) 50 MCG/ACT nasal spray Place 2 sprays into both nostrils daily.     Marland Kitchen lisinopril (ZESTRIL) 20 MG tablet Take 20 mg by mouth daily.     . pantoprazole (PROTONIX) 20 MG tablet Take 20 mg by mouth daily.     . QUEtiapine (SEROQUEL) 100 MG tablet Take 100 mg by mouth at bedtime.     Marland Kitchen albuterol (PROVENTIL HFA;VENTOLIN HFA) 108 (90 BASE) MCG/ACT inhaler Inhale 1 puff into the lungs every 4 (four) hours as needed for wheezing or shortness of breath. Ventolin (Patient not taking: Reported on 05/05/2018) 1 Inhaler 2 Not Taking at Unknown time  . divalproex (DEPAKOTE) 250 MG DR tablet Take 3 tablets (750 mg total) by mouth at bedtime. (Patient not taking: Reported on 10/19/2019) 90 tablet 0 Not Taking at Unknown time  . hydrOXYzine (ATARAX/VISTARIL) 50 MG tablet Take 1 tablet (50 mg total) by mouth every 6 (six) hours as needed for anxiety. (Patient not taking: Reported on 10/19/2019) 30 tablet 0 Not Taking at Unknown time  . nicotine (NICODERM CQ - DOSED IN MG/24 HOURS) 21  mg/24hr patch Place 1 patch (21 mg total) onto the skin daily. (Patient not taking: Reported on 10/19/2019) 28 patch 0 Not Taking at Unknown time  . OLANZapine (ZYPREXA) 10 MG tablet Take 1 tablet (10 mg total) by mouth at bedtime. (Patient not taking: Reported on 10/19/2019) 30 tablet 0 Not Taking at Unknown time  . traZODone (DESYREL) 50 MG tablet Take 1 tablet (50 mg total) by mouth at bedtime. (Patient not taking: Reported on 10/19/2019) 15 tablet 0 Not Taking at Unknown time  . Umeclidinium-Vilanterol (ANORO ELLIPTA) 62.5-25 MCG/INH AEPB Inhale 1 puff into the lungs daily. (Patient not taking: Reported on 05/05/2018) 30 each 11 Not Taking at Unknown time    Patient Stressors:    Patient Strengths:    Treatment Modalities: Medication Management, Group therapy, Case management,  1 to 1 session with  clinician, Psychoeducation, Recreational therapy.   Physician Treatment Plan for Primary Diagnosis: <principal problem not specified> Long Term Goal(s):     Short Term Goals:    Medication Management: Evaluate patient's response, side effects, and tolerance of medication regimen.  Therapeutic Interventions: 1 to 1 sessions, Unit Group sessions and Medication administration.  Evaluation of Outcomes: Adequate for Discharge  Physician Treatment Plan for Secondary Diagnosis: Active Problems:   MDD (major depressive disorder)   Psychosis ([place])  Long Term Goal(s):     Short Term Goals:       Medication Management: Evaluate patient's response, side effects, and tolerance of medication regimen.  Therapeutic Interventions: 1 to 1 sessions, Unit Group sessions and Medication administration.  Evaluation of Outcomes: Adequate for Discharge   RN Treatment Plan for Primary Diagnosis: <principal problem not specified> Long Term Goal(s): Knowledge of disease and therapeutic regimen to maintain health will improve  Short Term Goals: Ability to identify and develop effective coping behaviors will improve and Compliance with prescribed medications will improve  Medication Management: RN will administer medications as ordered by provider, will assess and evaluate patient's response and provide education to patient for prescribed medication. RN will report any adverse and/or side effects to prescribing provider.  Therapeutic Interventions: 1 on 1 counseling sessions, Psychoeducation, Medication administration, Evaluate responses to treatment, Monitor vital signs and CBGs as ordered, Perform/monitor CIWA, COWS, AIMS and Fall Risk screenings as ordered, Perform wound care treatments as ordered.  Evaluation of Outcomes: Adequate for Discharge   LCSW Treatment Plan for Primary Diagnosis: <principal problem not specified> Long Term Goal(s): Safe transition to appropriate next level of care at  discharge, Engage patient in therapeutic group addressing interpersonal concerns.  Short Term Goals: Engage patient in aftercare planning with referrals and resources and Increase skills for wellness and recovery  Therapeutic Interventions: Assess for all discharge needs, 1 to 1 time with Social worker, Explore available resources and support systems, Assess for adequacy in community support network, Educate family and significant other(s) on suicide prevention, Complete Psychosocial Assessment, Interpersonal group therapy.  Evaluation of Outcomes: Progressing  Progress in Treatment: Attending groups: Yes. Participating in groups: Yes. Taking medication as prescribed: Yes. Toleration medication: Yes. Family/Significant other contact made: Yes, individual(s) contacted:  daughter. Patient understands diagnosis: Yes. Discussing patient identified problems/goals with staff: Yes. Medical problems stabilized or resolved: Yes. Denies suicidal/homicidal ideation: Yes. Issues/concerns per patient self-inventory: No. Other:   New problem(s) identified: No, Describe:  none  New Short Term/Long Term Goal(s): Medication stabilization, elimination of SI thoughts, and development of a comprehensive mental wellness plan.   Patient Goals:  "Get rid of my 4 voices in my  head"  Discharge Plan or Barriers: Returning home, following up with [facility] for medication management and therapy.  Reason for Continuation of Hospitalization: Anxiety  Estimated Length of Stay: discharging today  Attendees: Patient: Michaela Wells 10/20/2019   Physician: Dr. Jeannine Kitten, MD 10/20/2019   Nursing: Casimiro Needle, RN 10/20/2019   RN Care Manager: 10/20/2019   Social Worker: Stephannie Peters, LCSW [place], [place] 10/20/2019   Recreational Therapist:  10/20/2019   Other: Earlyne Iba, MSW intern  10/20/2019   Other:  10/20/2019   Other: 10/20/2019     Scribe for Treatment Team: Darreld Mclean, LCSWA 10/25/2019 9:43 AM

## 2019-10-29 DIAGNOSIS — F439 Reaction to severe stress, unspecified: Secondary | ICD-10-CM | POA: Insufficient documentation

## 2020-01-31 ENCOUNTER — Telehealth (HOSPITAL_COMMUNITY): Payer: Medicaid Other | Admitting: Psychiatry

## 2020-02-23 ENCOUNTER — Ambulatory Visit (HOSPITAL_COMMUNITY): Payer: Medicaid Other | Admitting: Clinical

## 2020-02-23 ENCOUNTER — Other Ambulatory Visit: Payer: Self-pay

## 2020-03-01 ENCOUNTER — Telehealth (INDEPENDENT_AMBULATORY_CARE_PROVIDER_SITE_OTHER): Payer: Medicaid Other | Admitting: Psychiatric/Mental Health

## 2020-03-01 ENCOUNTER — Other Ambulatory Visit: Payer: Self-pay

## 2020-03-01 ENCOUNTER — Encounter (HOSPITAL_COMMUNITY): Payer: Self-pay | Admitting: Psychiatric/Mental Health

## 2020-03-01 DIAGNOSIS — F250 Schizoaffective disorder, bipolar type: Secondary | ICD-10-CM

## 2020-03-01 DIAGNOSIS — F315 Bipolar disorder, current episode depressed, severe, with psychotic features: Secondary | ICD-10-CM | POA: Diagnosis not present

## 2020-03-01 DIAGNOSIS — F411 Generalized anxiety disorder: Secondary | ICD-10-CM | POA: Diagnosis not present

## 2020-03-01 DIAGNOSIS — F4310 Post-traumatic stress disorder, unspecified: Secondary | ICD-10-CM

## 2020-03-01 DIAGNOSIS — F17210 Nicotine dependence, cigarettes, uncomplicated: Secondary | ICD-10-CM

## 2020-03-01 MED ORDER — HYDROXYZINE HCL 50 MG PO TABS: 50.0000 mg | ORAL_TABLET | Freq: Three times a day (TID) | ORAL | 3 refills | Status: DC | PRN

## 2020-03-01 MED ORDER — OLANZAPINE 5 MG PO TABS: 5.0000 mg | ORAL_TABLET | Freq: Every day | ORAL | 2 refills | Status: DC

## 2020-03-01 MED ORDER — NICOTINE 21 MG/24HR TD PT24: 21.0000 mg | MEDICATED_PATCH | Freq: Every day | TRANSDERMAL | 2 refills | Status: AC

## 2020-03-01 MED ORDER — BENZTROPINE MESYLATE 1 MG PO TABS: 1.0000 mg | ORAL_TABLET | Freq: Two times a day (BID) | ORAL | 2 refills | Status: DC

## 2020-03-01 MED ORDER — TEMAZEPAM 15 MG PO CAPS: 15.0000 mg | ORAL_CAPSULE | Freq: Every day | ORAL | 2 refills | Status: DC

## 2020-03-01 MED ORDER — QUETIAPINE FUMARATE 100 MG PO TABS: 100.0000 mg | ORAL_TABLET | Freq: Every day | ORAL | 3 refills | Status: DC

## 2020-03-01 NOTE — Progress Notes (Signed)
Psychiatric Initial Adult Assessment   Patient Identification: [name] MRN:  [id number] Date of Evaluation:  03/01/2020 Referral Source: Vesta Mixer Chief Complaint:  "Im a lot better today than yesterday" Visit Diagnosis:    ICD-10-CM   1. Cigarette nicotine dependence without complication  F17.210 nicotine (NICODERM CQ) 21 mg/24hr patch  2. Bipolar I disorder, current or most recent episode depressed, with psychotic features (HCC)  F31.5 QUEtiapine (SEROQUEL) 100 MG tablet    OLANZapine (ZYPREXA) 5 MG tablet  3. GAD (generalized anxiety disorder)  F41.1 hydrOXYzine (ATARAX/VISTARIL) 50 MG tablet  4. Schizoaffective disorder, bipolar type (HCC)  F25.0 benztropine (COGENTIN) 1 MG tablet    OLANZapine (ZYPREXA) 5 MG tablet  5. PTSD (post-traumatic stress disorder)  F43.10 DISCONTINUED: temazepam (RESTORIL) 15 MG capsule    History of Present Illness: Michaela Wells is a 59  year-old female seen today for initial psych evaluation.  Patient was referred to outpatient psychiatry by [facility] for medication management. Patient states that she has been out of medication for two weeks, as a result she says she cannot sleep. She endorses AH and denies VH.  She says that she can hear her neighbors talking. Feels that the neighbors are watching her.  She says that the neighbors have an app that can raise her blood pressure.  She says the neighbor puts the android over chest and it make her COPD flare-up.  She says that she knows it sounds "crazy". She says she can here them now [place].  She says that she scared they are going to kill her, like they killed her dog. She says they use "directional energy" to manipulate and make her and the dog sick until the dog died.  The vet said the dog  had a tumor.  [name] is currently being prescribed 100 mg of Seroquel to take at bedtime benztropine 1 mg twice a day and 10 mg of hydroxyzine to take 3 times a day as needed for anxiety.  [name] is really  concerned about the voices that she hears, she is very convinced that they are real, but she understands why people thinks she is "crazy ".  She has asked Clinical research associate, if there were any medications that she can begin taking to the voices.  At this time writer has added Zyprexa 5 mg to be taken at bedtime, writer has increased hydroxyzine from 10 mg 3 times daily as needed for anxiety to 50 mg 3 times daily as needed for anxiety as the patient states that this is the dose that worked for her while she was hospitalized at [facility] in March 2021.  [name] denies SI, HI, Lady endorses audio hallucinations as mentioned above and denies visual hallucinations.  [name] has also requested that her prescription for nicotine patch be refilled.  She states at this time she has increased her smoking of cigarettes to 2 to 3 packs a daily.  Writer has also prescribed 21 mg NicoDerm patch.  [name] states that she will fill her medications as soon as possible she is agreeable to the new medication regimen and has agreed to follow-up with the writer within 12 weeks.  No further concerns at this time Associated Signs/Symptoms: Depression Symptoms:  depressed mood, insomnia, anxiety, (Hypo) Manic Symptoms:  Delusions, Hallucinations, Irritable Mood, Labiality of Mood, Anxiety Symptoms:  Excessive Worry, Panic Symptoms, Social Anxiety, Psychotic Symptoms:  Delusions, Hallucinations: Auditory PTSD Symptoms: NA   Past Psychiatric History: bipolar disorder  Previous Psychotropic Medications: Yes  Substance Abuse History in the last 12 months:  No.  Consequences of Substance Abuse: NA  Past Medical History:  Past Medical History:  Diagnosis Date  . Bronchial asthma   . Chicken pox   . COPD (chronic obstructive pulmonary disease) (HCC)   . Depression   . Frequent headaches   . GERD (gastroesophageal reflux disease)   . UTI (lower urinary tract infection)     Past Surgical History:  Procedure Laterality  Date  . ESOPHAGEAL DILATION    . TONSILLECTOMY      Family Psychiatric History: unknown  Family History:  Family History  Problem Relation Age of Onset  . Other Mother        MVA-Deceased [pt was age 23]  . COPD Father        Deceased  . Heart attack Maternal Grandmother   . Stroke Maternal Grandmother   . Brain cancer Maternal Grandmother   . Arthritis/Rheumatoid Maternal Grandmother   . Heart attack Maternal Grandfather   . Stroke Maternal Grandfather   . Heart disease Maternal Aunt   . Stroke Maternal Aunt        #1  . Heart attack Maternal Aunt        #1  . Hypertension Maternal Aunt        #1  . Arthritis/Rheumatoid Sister   . COPD Sister   . Heart disease Maternal Uncle   . Heart attack Maternal Uncle        x4  . Heart disease Brother        #1  . Heart attack Brother        #1  . Diabetes Brother   . Diabetes Maternal Aunt   . Mental retardation Maternal Aunt   . Healthy Daughter        x2  . Drug abuse Daughter        #1  . Drug abuse Daughter        #2  . Kidney Stones Daughter        #2  . Other Son        Agoraphobia    Social History:   Social History   Socioeconomic History  . Marital status: Single    Spouse name: Not on file  . Number of children: Not on file  . Years of education: Not on file  . Highest education level: Not on file  Occupational History  . Not on file  Tobacco Use  . Smoking status: Current Every Day Smoker  . Smokeless tobacco: Never Used  Substance and Sexual Activity  . Alcohol use: Yes  . Drug use: Not Currently  . Sexual activity: Not Currently  Other Topics Concern  . Not on file  Social History Narrative  . Not on file   Social Determinants of Health   Financial Resource Strain:   . Difficulty of Paying Living Expenses:   Food Insecurity:   . Worried About Programme researcher, broadcasting/film/videounning Out of Food in the Last Year:   . Baristaan Out of Food in the Last Year:   Transportation Needs:   . Freight forwarderLack of Transportation (Medical):   Marland Kitchen.  Lack of Transportation (Non-Medical):   Physical Activity:   . Days of Exercise per Week:   . Minutes of Exercise per Session:   Stress:   . Feeling of Stress :   Social Connections:   . Frequency of Communication with Friends and Family:   . Frequency of Social Gatherings with Friends and Family:   .  Attends Religious Services:   . Active Member of Clubs or Organizations:   . Attends Banker Meetings:   Marland Kitchen Marital Status:     Additional Social History: Daughter is moving to live with her in two weeks.  Allergies:   Allergies  Allergen Reactions  . Penicillins Other (See Comments)    Childhood allergy Has patient had a PCN reaction causing immediate rash, facial/tongue/throat swelling, SOB or lightheadedness with hypotension: Unknown Has patient had a PCN reaction causing severe rash involving mucus membranes or skin necrosis: Unknown Has patient had a PCN reaction that required hospitalization: Unknown Has patient had a PCN reaction occurring within the last 10 years: Unknown If all of the above answers are "NO", then may proceed with Cephalosporin use.      Metabolic Disorder Labs: Lab Results  Component Value Date   HGBA1C 6.7 (H) 05/06/2018   MPG 145.59 05/06/2018   No results found for: PROLACTIN Lab Results  Component Value Date   CHOL 203 (H) 05/06/2018   TRIG 73 05/06/2018   HDL 51 05/06/2018   CHOLHDL 4.0 05/06/2018   VLDL 15 05/06/2018   LDLCALC 137 (H) 05/06/2018   Lab Results  Component Value Date   TSH 3.388 10/23/2019    Therapeutic Level Labs: No results found for: LITHIUM No results found for: CBMZ Lab Results  Component Value Date   VALPROATE 86 05/10/2018    Current Medications: Current Outpatient Medications  Medication Sig Dispense Refill  . albuterol (PROVENTIL HFA;VENTOLIN HFA) 108 (90 BASE) MCG/ACT inhaler Inhale 1 puff into the lungs every 4 (four) hours as needed for wheezing or shortness of breath. Ventolin (Patient  not taking: Reported on 05/05/2018) 1 Inhaler 2  . benztropine (COGENTIN) 1 MG tablet Take 1 tablet (1 mg total) by mouth 2 (two) times daily. 60 tablet 2  . carvedilol (COREG) 25 MG tablet Take 1 tablet (25 mg total) by mouth 2 (two) times daily with a meal. 60 tablet 2  . fluticasone (FLONASE) 50 MCG/ACT nasal spray Place 2 sprays into both nostrils daily.    . hydrOXYzine (ATARAX/VISTARIL) 50 MG tablet Take 1 tablet (50 mg total) by mouth 3 (three) times daily as needed. 90 tablet 3  . losartan (COZAAR) 50 MG tablet Take 1 tablet (50 mg total) by mouth daily. 90 tablet 1  . nicotine (NICODERM CQ - DOSED IN MG/24 HOURS) 21 mg/24hr patch Place 1 patch (21 mg total) onto the skin daily. (Patient not taking: Reported on 10/19/2019) 28 patch 0  . nicotine (NICODERM CQ) 21 mg/24hr patch Place 1 patch (21 mg total) onto the skin daily. 30 patch 2  . OLANZapine (ZYPREXA) 5 MG tablet Take 1 tablet (5 mg total) by mouth daily. 30 tablet 2  . omega-3 acid ethyl esters (LOVAZA) 1 g capsule Take 1 capsule (1 g total) by mouth 2 (two) times daily. 60 capsule 5  . pantoprazole (PROTONIX) 20 MG tablet Take 20 mg by mouth daily.    . Prenatal Vit-Fe Fumarate-FA (PRENATAL MULTIVITAMIN) TABS tablet Take 1 tablet by mouth daily at 12 noon. 90 tablet 1  . QUEtiapine (SEROQUEL) 100 MG tablet Take 1 tablet (100 mg total) by mouth at bedtime. 30 tablet 3  . Umeclidinium-Vilanterol (ANORO ELLIPTA) 62.5-25 MCG/INH AEPB Inhale 1 puff into the lungs daily. (Patient not taking: Reported on 05/05/2018) 30 each 11   No current facility-administered medications for this visit.    Musculoskeletal: Strength & Muscle Tone: within normal limits Gait &  Station: normal Patient leans: N/A  Psychiatric Specialty Exam: Review of Systems  Psychiatric/Behavioral: Positive for dysphoric mood and hallucinations. Negative for suicidal ideas. The patient is nervous/anxious.   All other systems reviewed and are negative.   There were  no vitals taken for this visit.There is no height or weight on file to calculate BMI.  General Appearance: Casual  Eye Contact:  Good  Speech:  Clear and Coherent  Volume:  Normal  Mood:  Anxious, Depressed and Dysphoric  Affect:  Congruent and Depressed  Thought Process:  Coherent and Descriptions of Associations: Intact  Orientation:  Full (Time, Place, and Person)  Thought Content:  Illogical, Delusions, Hallucinations: Auditory and Paranoid Ideation  Suicidal Thoughts:  No  Homicidal Thoughts:  No  Memory:  Immediate;   Good  Judgement:  Impaired  Insight:  Lacking  Psychomotor Activity:  Normal  Concentration:  Attention Span: Fair  Recall:  Good  Fund of Knowledge:Fair  Language: Fair  Akathisia:  NA  Handed:  Right  AIMS (if indicated):  not done  Assets:  Communication Skills Desire for Improvement Financial Resources/Insurance Housing Social Support  ADL's:  Intact  Cognition: Impaired,  Mild  Sleep:  Poor   Screenings: AIMS     [facility] (Dis[facility]) [facility]  AIMS Total Score 0 0    AUDIT     Admission (Discharged) [facility]  Alcohol Use Disorder Identification Test Final Score (AUDIT) 5 4      Assessment and Plan: Latoya is agreeable to begin Zyprexa 5 mg to be taken at bedtime.  She will continue to take Seroquel 100 mg at bedtime she will begin to take hydroxyzine 50 mg as needed for anxiety increased from 10 mg as needed for anxiety, she will continue to take Cogentin 1 mg twice daily and she will be given wearing her NicoDerm 21 mg patch as her cigarette consumption has doubled.    ICD-10-CM   1. Cigarette nicotine dependence without complication   F17.210 nicotine (NICODERM CQ) 21 mg/24hr patch  2. Bipolar I disorder, current or most recent episode depressed, with psychotic features (HCC)  F31.5 QUEtiapine (SEROQUEL) 100 MG tablet    OLANZapine (ZYPREXA) 5 MG tablet  3. GAD (generalized anxiety disorder)  F41.1 hydrOXYzine (ATARAX/VISTARIL) 50 MG tablet  4. Schizoaffective disorder, bipolar type (HCC)  F25.0 benztropine (COGENTIN) 1 MG tablet    OLANZapine (ZYPREXA) 5 MG tablet  5. PTSD (post-traumatic stress disorder)  F43.10 DISCONTINUED: temazepam (RESTORIL) 15 MG capsule     Jearld Lesch, NP 7/7/20215:03 PM

## 2020-03-06 ENCOUNTER — Telehealth (HOSPITAL_COMMUNITY): Payer: Self-pay | Admitting: *Deleted

## 2020-03-06 NOTE — Telephone Encounter (Signed)
Called per front desk, stating she has lost one of her meds, clarified to say it wasn't available when she went to pharmacy one of her RX wasn't there. Will follow up with her.

## 2020-03-06 NOTE — Telephone Encounter (Signed)
Called [facility] to confirm Cogentin RX was called in there to cx it and call new RX of Cogentin to [facility] where the others went and where she wants it called in. [facility] never received a RX for anything from this agency. Called [facility] as listed on her chart to call RX there as prescribed by Ms Durwin Nora NP.

## 2020-04-04 ENCOUNTER — Encounter (HOSPITAL_COMMUNITY): Payer: Self-pay | Admitting: Emergency Medicine

## 2020-04-04 ENCOUNTER — Emergency Department (HOSPITAL_COMMUNITY)
Admission: EM | Admit: 2020-04-04 | Discharge: 2020-04-05 | Disposition: A | Discharge status: Home / Self Care | Payer: Medicaid Other | Attending: Emergency Medicine | Admitting: Emergency Medicine

## 2020-04-04 ENCOUNTER — Other Ambulatory Visit: Payer: Self-pay

## 2020-04-04 DIAGNOSIS — R0602 Shortness of breath: Secondary | ICD-10-CM | POA: Insufficient documentation

## 2020-04-04 DIAGNOSIS — Z5321 Procedure and treatment not carried out due to patient leaving prior to being seen by health care provider: Secondary | ICD-10-CM | POA: Diagnosis not present

## 2020-04-04 LAB — COMPREHENSIVE METABOLIC PANEL
ALT: 14 U/L (ref 0–44)
AST: 19 U/L (ref 15–41)
Albumin: 3.8 g/dL (ref 3.5–5.0)
Alkaline Phosphatase: 87 U/L (ref 38–126)
Anion gap: 12 (ref 5–15)
BUN: 8 mg/dL (ref 6–20)
CO2: 25 mmol/L (ref 22–32)
Calcium: 8.8 mg/dL — ABNORMAL LOW (ref 8.9–10.3)
Chloride: 107 mmol/L (ref 98–111)
Creatinine, Ser: 0.92 mg/dL (ref 0.44–1.00)
GFR calc Af Amer: >60 mL/min (ref >60)
GFR calc non Af Amer: >60 mL/min (ref >60)
Glucose, Bld: 113 mg/dL — ABNORMAL HIGH (ref 70–99)
Potassium: 3.8 mmol/L (ref 3.5–5.1)
Sodium: 144 mmol/L (ref 135–145)
Total Bilirubin: 0.5 mg/dL (ref 0.3–1.2)
Total Protein: 6.9 g/dL (ref 6.5–8.1)

## 2020-04-04 LAB — CBC WITH DIFFERENTIAL/PLATELET
Abs Immature Granulocytes: 0.03 10*3/uL (ref 0.00–0.07)
Basophils Absolute: 0.1 10*3/uL (ref 0.0–0.1)
Basophils Relative: 1 %
Eosinophils Absolute: 0.1 10*3/uL (ref 0.0–0.5)
Eosinophils Relative: 1 %
HCT: 43.9 % (ref 36.0–46.0)
Hemoglobin: 14.4 g/dL (ref 12.0–15.0)
Immature Granulocytes: 0 %
Lymphocytes Relative: 18 %
Lymphs Abs: 1.3 10*3/uL (ref 0.7–4.0)
MCH: 30.4 pg (ref 26.0–34.0)
MCHC: 32.8 g/dL (ref 30.0–36.0)
MCV: 92.6 fL (ref 80.0–100.0)
Monocytes Absolute: 0.6 10*3/uL (ref 0.1–1.0)
Monocytes Relative: 8 %
Neutro Abs: 5.1 10*3/uL (ref 1.7–7.7)
Neutrophils Relative %: 72 %
Platelets: 374 10*3/uL (ref 150–400)
RBC: 4.74 MIL/uL (ref 3.87–5.11)
RDW: 13.1 % (ref 11.5–15.5)
WBC: 7.1 10*3/uL (ref 4.0–10.5)
nRBC: 0 % (ref 0.0–0.2)

## 2020-04-04 LAB — TROPONIN I (HIGH SENSITIVITY): Troponin I (High Sensitivity): 3 ng/L (ref <18)

## 2020-04-04 NOTE — ED Triage Notes (Signed)
Pt st's she got her second covid vaccine yesterday and today woke up with shortness of breath and feeling like her heart is fluttering  Pt denies any chest pain

## 2020-04-05 ENCOUNTER — Other Ambulatory Visit (HOSPITAL_COMMUNITY): Payer: Self-pay | Admitting: Psychiatric/Mental Health

## 2020-06-01 ENCOUNTER — Telehealth (HOSPITAL_COMMUNITY): Payer: Medicaid Other

## 2020-06-14 ENCOUNTER — Telehealth (INDEPENDENT_AMBULATORY_CARE_PROVIDER_SITE_OTHER): Payer: Medicaid Other | Admitting: Psychiatry

## 2020-06-14 ENCOUNTER — Other Ambulatory Visit: Payer: Self-pay

## 2020-06-14 ENCOUNTER — Encounter (HOSPITAL_COMMUNITY): Payer: Self-pay | Admitting: Psychiatry

## 2020-06-14 DIAGNOSIS — F250 Schizoaffective disorder, bipolar type: Secondary | ICD-10-CM

## 2020-06-14 DIAGNOSIS — F315 Bipolar disorder, current episode depressed, severe, with psychotic features: Secondary | ICD-10-CM | POA: Diagnosis not present

## 2020-06-14 DIAGNOSIS — F411 Generalized anxiety disorder: Secondary | ICD-10-CM | POA: Diagnosis not present

## 2020-06-14 MED ORDER — OLANZAPINE 10 MG PO TABS: 10.0000 mg | ORAL_TABLET | Freq: Every day | ORAL | 2 refills | Status: DC

## 2020-06-14 MED ORDER — QUETIAPINE FUMARATE 100 MG PO TABS: 100.0000 mg | ORAL_TABLET | Freq: Every day | ORAL | 2 refills | Status: DC

## 2020-06-14 MED ORDER — BENZTROPINE MESYLATE 1 MG PO TABS: 1.0000 mg | ORAL_TABLET | Freq: Two times a day (BID) | ORAL | 2 refills | Status: DC

## 2020-06-14 MED ORDER — HYDROXYZINE HCL 50 MG PO TABS: 50.0000 mg | ORAL_TABLET | Freq: Three times a day (TID) | ORAL | 2 refills | Status: DC | PRN
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
## 2020-06-26 ENCOUNTER — Ambulatory Visit (INDEPENDENT_AMBULATORY_CARE_PROVIDER_SITE_OTHER): Payer: Medicaid Other | Admitting: Clinical

## 2020-06-26 ENCOUNTER — Other Ambulatory Visit: Payer: Self-pay

## 2020-06-26 DIAGNOSIS — F250 Schizoaffective disorder, bipolar type: Secondary | ICD-10-CM
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
## 2020-07-01 ENCOUNTER — Other Ambulatory Visit (HOSPITAL_COMMUNITY): Payer: Self-pay | Admitting: Psychiatric/Mental Health

## 2020-07-01 DIAGNOSIS — F315 Bipolar disorder, current episode depressed, severe, with psychotic features: Secondary | ICD-10-CM

## 2020-07-01 DIAGNOSIS — F250 Schizoaffective disorder, bipolar type: Secondary | ICD-10-CM

## 2020-07-28 ENCOUNTER — Telehealth: Payer: Self-pay | Admitting: General Practice

## 2020-07-28 ENCOUNTER — Telehealth (HOSPITAL_COMMUNITY): Payer: Self-pay | Admitting: Clinical

## 2020-07-28 NOTE — Telephone Encounter (Signed)
Copied from CRM [id number]. Topic: General - Other >> Jul 28, 2020  2:14 PM Michaela Wells wrote: Reason for CRM: Patient called in to say that she is having panic attacks and need to speak with the person that preson that cares for her mental health asking for Wells call back at Ph# [phone]  Attempting to route this message to correct location.

## 2020-07-28 NOTE — Telephone Encounter (Signed)
Therapist asked me to call her as she had expressed concerns to her re her medicine. She reports a worsening of her anxiety and a strong belief that her neighbors were doing things to her, increased paranoia. She states she is afraid all the time but doesn't believe she needs to go into the hospital. She reports feeling this bad for the the last three to four days. States the increase of her Zyprexa on month plus ago never helped. She was directed to increase her Zyprexa by an additional 5 mg by breaking her 10 mg pill and taking it now and then continuing with her regular 10 mg Zyprexa and other meds at night. SHe is reporting not sleeping and doubling her Vistaril. Asked her not to double her Vistaril. Only change in a 5 mg increase in her Zyprexa. Will consult with Ms Doyne Keel Monday, she has left for today and writer will follow up with her on Monday with Toy Cookey NPs directions. Consulted Eddie PA for the increase in her Olanzapine. She verbalized her understanding.

## 2020-07-28 NOTE — Telephone Encounter (Signed)
Therapist was relayed a message to reach out to the client via tele -phone due to the clients report of worsening anxiety to another [facility] provider. Therapist was able to make contact with the client via tele -phone to conduct to assess the clients needs. Client reported her medication has been making her sick and she is loosing sleeping. Client was tearful. Client reported she is not in crisis and does not require medical observation but would like to have a nurse reach out to her to have her medication evaluated for effectiveness and to be adjusted. Therapist reported she will discuss he concerns with nursing staff and have someone to reach out to her. Therapist discussed with the client continuing medication compliance and practicing self care of eating, sleeping, and breathing exercises. Client agreed.

## 2020-07-31 ENCOUNTER — Other Ambulatory Visit (HOSPITAL_COMMUNITY): Payer: Self-pay | Admitting: Psychiatry

## 2020-07-31 DIAGNOSIS — F411 Generalized anxiety disorder: Secondary | ICD-10-CM

## 2020-07-31 DIAGNOSIS — F315 Bipolar disorder, current episode depressed, severe, with psychotic features: Secondary | ICD-10-CM

## 2020-07-31 DIAGNOSIS — F250 Schizoaffective disorder, bipolar type: Secondary | ICD-10-CM

## 2020-07-31 MED ORDER — BUSPIRONE HCL 10 MG PO TABS: 10.0000 mg | ORAL_TABLET | Freq: Three times a day (TID) | ORAL | 2 refills | Status: DC

## 2020-07-31 MED ORDER — MIRTAZAPINE 15 MG PO TABS: 15.0000 mg | ORAL_TABLET | Freq: Every day | ORAL | 2 refills | Status: DC

## 2020-07-31 MED ORDER — OLANZAPINE 15 MG PO TABS: 15.0000 mg | ORAL_TABLET | Freq: Every day | ORAL | 2 refills | Status: DC

## 2020-07-31 NOTE — Telephone Encounter (Signed)
Patient informed writer that she has been having increased paranoia, AH, impaired memory, depression, and anxiety. She notes that she hears her neighbors in [place] talking about her and noted that they try to hurt her (noting that they are the cause of her tunnel vision and dead dog).  At this time she notes that she does not want to harm herself or anyone else.  She informed provider that she stays to herself and notes that at this time she feels that she is safe and does not require hospitalization.  Provider recommended increasing Zyprexa 10 mg to 15 mg daily.  Provider also recommended that patient discontinue Seroquel.  She was instructed to take Seroquel 50 mg for a week and then discontinue it as Zyprexa and Seroquel in combination can cause adverse effects.  She is also agreeable to start mirtazapine 15 mg nightly to help manage symptoms of anxiety, sleep, and depression.  Patient informed provider that she has chest pain, shortness of breath, and palpitations when she is overly anxious.  Patient will also start BuSpar 10 mg 3 times daily to help manage anxiety.Potential side effects of medication and risks vs benefits of treatment vs non-treatment were explained and discussed. All questions were answered.  Provider informed patient that if paranoia and psychosis does not improve within a week or 2 she would like patient to come in to [facility] for further evaluation and observation.  She endorsed understanding and agreed.  No other concerns noted at this time.

## 2020-08-13 ENCOUNTER — Other Ambulatory Visit (HOSPITAL_COMMUNITY): Payer: Self-pay | Admitting: Psychiatric/Mental Health

## 2020-08-13 DIAGNOSIS — F411 Generalized anxiety disorder: Secondary | ICD-10-CM

## 2020-08-16 ENCOUNTER — Telehealth (HOSPITAL_COMMUNITY): Payer: No Payment, Other | Admitting: Psychiatry

## 2020-08-16 ENCOUNTER — Telehealth (HOSPITAL_COMMUNITY): Payer: Self-pay | Admitting: *Deleted

## 2020-08-16 ENCOUNTER — Other Ambulatory Visit (INDEPENDENT_AMBULATORY_CARE_PROVIDER_SITE_OTHER): Payer: Medicaid Other | Admitting: Physician Assistant

## 2020-08-16 DIAGNOSIS — F411 Generalized anxiety disorder: Secondary | ICD-10-CM

## 2020-08-16 MED ORDER — BUSPIRONE HCL 10 MG PO TABS: 10.0000 mg | ORAL_TABLET | Freq: Three times a day (TID) | ORAL | 2 refills | Status: DC

## 2020-08-16 NOTE — Telephone Encounter (Signed)
Pill amount of prescription for Buspirone was changed from 30 pills to 90. Prescription e-prescribed to pharmacy of choice.

## 2020-08-16 NOTE — Progress Notes (Signed)
Michaela Wells is a 59 year old female with a past psychiatric history significant for generalized anxiety disorder. Patient is taking Buspirone 10 mg 3 times daily for the management of her anxiety. Patient is requesting that her prescription be changed so that one bottle of pills last her the full duration of a month. Prescription was changed appropriately and e-prescribed to the pharmacy of choice.

## 2020-08-16 NOTE — Telephone Encounter (Signed)
VM left for writer requesting her Rx be changed to a month supply on her Buspar rather than the 30 pills which it was written for. Will ask provider that is present today to cx the refills and change Rx for 90 pills which for her Buspar TID will be a month.

## 2020-09-01 ENCOUNTER — Telehealth (HOSPITAL_COMMUNITY): Payer: Self-pay | Admitting: Clinical

## 2020-09-01 ENCOUNTER — Other Ambulatory Visit: Payer: Self-pay

## 2020-09-01 ENCOUNTER — Ambulatory Visit (INDEPENDENT_AMBULATORY_CARE_PROVIDER_SITE_OTHER): Payer: Medicaid Other | Admitting: Clinical

## 2020-09-01 ENCOUNTER — Ambulatory Visit (HOSPITAL_COMMUNITY): Payer: Medicaid Other | Admitting: Clinical

## 2020-09-01 DIAGNOSIS — F250 Schizoaffective disorder, bipolar type: Secondary | ICD-10-CM | POA: Diagnosis not present

## 2020-09-01 NOTE — Telephone Encounter (Signed)
Therapist sent the client a text message link for the scheduled MyChart virtual therapy visit. Client did not check in. Therapist attempted phone call to the clients cell phone. Clients cell phone persistently rang without a voice mail. Therapist unable to leave a message for the client to call back.
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
## 2020-09-09 ENCOUNTER — Other Ambulatory Visit (HOSPITAL_COMMUNITY): Payer: Self-pay | Admitting: Psychiatry

## 2020-09-09 DIAGNOSIS — F250 Schizoaffective disorder, bipolar type: Secondary | ICD-10-CM

## 2020-09-10 ENCOUNTER — Other Ambulatory Visit (HOSPITAL_COMMUNITY): Payer: Self-pay | Admitting: Psychiatry

## 2020-09-10 DIAGNOSIS — F250 Schizoaffective disorder, bipolar type: Secondary | ICD-10-CM

## 2020-09-10 DIAGNOSIS — F315 Bipolar disorder, current episode depressed, severe, with psychotic features: Secondary | ICD-10-CM

## 2020-09-12 ENCOUNTER — Other Ambulatory Visit: Payer: Self-pay

## 2020-09-12 ENCOUNTER — Ambulatory Visit (INDEPENDENT_AMBULATORY_CARE_PROVIDER_SITE_OTHER): Payer: Medicaid Other | Admitting: Clinical

## 2020-09-12 DIAGNOSIS — F250 Schizoaffective disorder, bipolar type: Secondary | ICD-10-CM
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
## 2020-09-19 ENCOUNTER — Telehealth (HOSPITAL_COMMUNITY): Payer: No Payment, Other | Admitting: Psychiatry

## 2020-10-02 ENCOUNTER — Telehealth (INDEPENDENT_AMBULATORY_CARE_PROVIDER_SITE_OTHER): Payer: Medicaid Other | Admitting: Psychiatry

## 2020-10-02 ENCOUNTER — Encounter (HOSPITAL_COMMUNITY): Payer: Self-pay | Admitting: Psychiatry

## 2020-10-02 ENCOUNTER — Other Ambulatory Visit: Payer: Self-pay

## 2020-10-02 DIAGNOSIS — F411 Generalized anxiety disorder: Secondary | ICD-10-CM | POA: Diagnosis not present

## 2020-10-02 DIAGNOSIS — F250 Schizoaffective disorder, bipolar type: Secondary | ICD-10-CM

## 2020-10-02 MED ORDER — BENZTROPINE MESYLATE 1 MG PO TABS: ORAL_TABLET | ORAL | 2 refills | Status: DC

## 2020-10-02 MED ORDER — HYDROXYZINE HCL 50 MG PO TABS: 50.0000 mg | ORAL_TABLET | Freq: Three times a day (TID) | ORAL | 2 refills | Status: DC | PRN

## 2020-10-02 MED ORDER — QUETIAPINE FUMARATE 200 MG PO TABS: 200.0000 mg | ORAL_TABLET | Freq: Every day | ORAL | 2 refills | Status: DC

## 2020-10-02 MED ORDER — BUSPIRONE HCL 15 MG PO TABS: 15.0000 mg | ORAL_TABLET | Freq: Three times a day (TID) | ORAL | 2 refills | Status: DC
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
## 2020-10-09 ENCOUNTER — Other Ambulatory Visit (HOSPITAL_COMMUNITY): Payer: Self-pay | Admitting: Psychiatry

## 2020-10-09 DIAGNOSIS — F315 Bipolar disorder, current episode depressed, severe, with psychotic features: Secondary | ICD-10-CM

## 2020-10-13 ENCOUNTER — Ambulatory Visit: Payer: Medicaid Other | Admitting: Internal Medicine

## 2020-11-07 ENCOUNTER — Ambulatory Visit (INDEPENDENT_AMBULATORY_CARE_PROVIDER_SITE_OTHER): Payer: Medicaid Other | Admitting: Clinical

## 2020-11-07 ENCOUNTER — Other Ambulatory Visit: Payer: Self-pay

## 2020-11-07 DIAGNOSIS — F250 Schizoaffective disorder, bipolar type: Secondary | ICD-10-CM

## 2020-11-08 ENCOUNTER — Telehealth (INDEPENDENT_AMBULATORY_CARE_PROVIDER_SITE_OTHER): Payer: Medicaid Other | Admitting: Psychiatry

## 2020-11-08 ENCOUNTER — Encounter (HOSPITAL_COMMUNITY): Payer: Self-pay | Admitting: Psychiatry

## 2020-11-08 ENCOUNTER — Other Ambulatory Visit: Payer: Self-pay

## 2020-11-08 DIAGNOSIS — F250 Schizoaffective disorder, bipolar type: Secondary | ICD-10-CM | POA: Diagnosis not present

## 2020-11-08 DIAGNOSIS — F411 Generalized anxiety disorder: Secondary | ICD-10-CM

## 2020-11-08 MED ORDER — QUETIAPINE FUMARATE 400 MG PO TABS: 400.0000 mg | ORAL_TABLET | Freq: Every day | ORAL | 2 refills | Status: DC

## 2020-11-08 MED ORDER — DIVALPROEX SODIUM 500 MG PO DR TAB: 500.0000 mg | DELAYED_RELEASE_TABLET | Freq: Two times a day (BID) | ORAL | 2 refills | Status: DC

## 2020-11-08 MED ORDER — BUSPIRONE HCL 15 MG PO TABS: 15.0000 mg | ORAL_TABLET | Freq: Three times a day (TID) | ORAL | 2 refills | Status: DC

## 2020-11-08 MED ORDER — BENZTROPINE MESYLATE 1 MG PO TABS: ORAL_TABLET | ORAL | 2 refills | Status: DC

## 2020-11-08 MED ORDER — CLONAZEPAM 0.5 MG PO TABS: 0.5000 mg | ORAL_TABLET | Freq: Two times a day (BID) | ORAL | 0 refills | Status: DC

## 2020-11-08 NOTE — Progress Notes (Signed)
BH MD/PA/NP OP Progress Note Virtual Visit via Telephone Note  I connected with Michaela Wells on 11/08/20 at  9:00 AM EDT by telephone and verified that I am speaking with the correct person using two identifiers.  Location: Patient: home Provider: Clinic   I discussed the limitations, risks, security and privacy concerns of performing an evaluation and management service by telephone and the availability of in person appointments. I also discussed with the patient that there may be a patient responsible charge related to this service. The patient expressed understanding and agreed to proceed.   I provided 30 minutes of non-face-to-face time during this encounter.      11/08/2020 9:35 AM Michaela PaneRhonda L Wells  MRN:  [id number]  Chief Complaint: "The neighbors are still hurting me and Im anxious"  HPI: 60 year old female seen today for follow-up psychiatric evaluation.  She has a psychiatric history of schizoaffective disorder, bipolar disorder, anxiety, and depression.  She is currently managed on Buspar 15 mg three times daily, Cogentin 1 mg 2 times daily, Seroquel 200 mg nightly, and hydroxyzine 50 mg 3 times daily as needed.  Today was unable to login virtually so her exam was done over the phone.  Throughout conversation patient presents with delusions and notes that she has been having problems with auditory hallucinations. During assessment she was pleasant, cooperative, and engaged in conversation. Patient notes that she is in constant fear that she will die. She notes that she is having panic attacks daily. She notes during a panic attack she has increased respirations, increased heart rate, and sweating. She notes that her panic attacks are brought on by her neighbors who she notes is hurting her on different parts of her body. She notes that she hears them in her head talking about her and threatening her.  Patient notes that the above stressors exacerbates her anxiety and  depression. Provider conducted a GAD-7 and patient scored a 21, at her last visit she scored a 21.  Provider also conducted a PHQ-9 and patient scored a 23, at her last visit she scored a 23. Today she denies SI/HI/VH.   She endorses poor sleep and poor appetite. She notes that she eats crackers and can meat once daily.  Today she is agreeable to increasing Seroquel 200 mg to 400 mg to help manage sleep, AH, and depression. She is also agreeable to start Klonopin 0.5 mg twice daily as needed to help manage anxiety. Provider informed patient that Klonopin is for short term use. She endorsed understanding. She is also agreeable to start Depakote 500 mg twice daily to help manage mood. Potential side effects of medication and risks vs benefits of treatment vs non-treatment were explained and discussed. All questions were answered. Provider discussed starting an antidepressant such as Trintellix at next visit if symptoms of anxiety/depression does not improve. She will continue all other medications as prescribed. No other concerns noted at this time.  Visit Diagnosis:    ICD-10-CM   1. Schizoaffective disorder, bipolar type (HCC)  F25.0 divalproex (DEPAKOTE) 500 MG DR tablet    QUEtiapine (SEROQUEL) 400 MG tablet    benztropine (COGENTIN) 1 MG tablet  2. GAD (generalized anxiety disorder)  F41.1 busPIRone (BUSPAR) 15 MG tablet    clonazePAM (KLONOPIN) 0.5 MG tablet    Past Psychiatric History:  schizoaffective disorder, bipolar disorder, anxiety, and depression  Past Medical History:  Past Medical History:  Diagnosis Date  . Bronchial asthma   . Chicken pox   . COPD (  chronic obstructive pulmonary disease) (HCC)   . Depression   . Frequent headaches   . GERD (gastroesophageal reflux disease)   . UTI (lower urinary tract infection)     Past Surgical History:  Procedure Laterality Date  . ESOPHAGEAL DILATION    . TONSILLECTOMY      Family Psychiatric History:  Daughter Bipolar and SA,  Daughter anxiety, Son anxiety  Family History:  Family History  Problem Relation Age of Onset  . Other Mother        MVA-Deceased [pt was age 40]  . COPD Father        Deceased  . Heart attack Maternal Grandmother   . Stroke Maternal Grandmother   . Brain cancer Maternal Grandmother   . Arthritis/Rheumatoid Maternal Grandmother   . Heart attack Maternal Grandfather   . Stroke Maternal Grandfather   . Heart disease Maternal Aunt   . Stroke Maternal Aunt        #1  . Heart attack Maternal Aunt        #1  . Hypertension Maternal Aunt        #1  . Arthritis/Rheumatoid Sister   . COPD Sister   . Heart disease Maternal Uncle   . Heart attack Maternal Uncle        x4  . Heart disease Brother        #1  . Heart attack Brother        #1  . Diabetes Brother   . Diabetes Maternal Aunt   . Mental retardation Maternal Aunt   . Healthy Daughter        x2  . Drug abuse Daughter        #1  . Drug abuse Daughter        #2  . Kidney Stones Daughter        #2  . Other Son        Agoraphobia    Social History:  Social History   Socioeconomic History  . Marital status: Single    Spouse name: Not on file  . Number of children: Not on file  . Years of education: Not on file  . Highest education level: Not on file  Occupational History  . Not on file  Tobacco Use  . Smoking status: Current Every Day Smoker  . Smokeless tobacco: Never Used  Substance and Sexual Activity  . Alcohol use: Yes  . Drug use: Not Currently  . Sexual activity: Not Currently  Other Topics Concern  . Not on file  Social History Narrative  . Not on file   Social Determinants of Health   Financial Resource Strain: Not on file  Food Insecurity: Not on file  Transportation Needs: Not on file  Physical Activity: Not on file  Stress: Not on file  Social Connections: Not on file    Allergies:  Allergies  Allergen Reactions  . Penicillins Other (See Comments)    Childhood allergy Has patient  had a PCN reaction causing immediate rash, facial/tongue/throat swelling, SOB or lightheadedness with hypotension: Unknown Has patient had a PCN reaction causing severe rash involving mucus membranes or skin necrosis: Unknown Has patient had a PCN reaction that required hospitalization: Unknown Has patient had a PCN reaction occurring within the last 10 years: Unknown If all of the above answers are "NO", then may proceed with Cephalosporin use.      Metabolic Disorder Labs: Lab Results  Component Value Date   HGBA1C 6.7 (H) 05/06/2018  MPG 145.59 05/06/2018   No results found for: PROLACTIN Lab Results  Component Value Date   CHOL 203 (H) 05/06/2018   TRIG 73 05/06/2018   HDL 51 05/06/2018   CHOLHDL 4.0 05/06/2018   VLDL 15 05/06/2018   LDLCALC 137 (H) 05/06/2018   Lab Results  Component Value Date   TSH 3.388 10/23/2019   TSH 1.195 05/06/2018    Therapeutic Level Labs: No results found for: LITHIUM Lab Results  Component Value Date   VALPROATE 86 05/10/2018   No components found for:  CBMZ  Current Medications: Current Outpatient Medications  Medication Sig Dispense Refill  . clonazePAM (KLONOPIN) 0.5 MG tablet Take 1 tablet (0.5 mg total) by mouth 2 (two) times daily. 60 tablet 0  . divalproex (DEPAKOTE) 500 MG DR tablet Take 1 tablet (500 mg total) by mouth 2 (two) times daily. 60 tablet 2  . albuterol (PROVENTIL HFA;VENTOLIN HFA) 108 (90 BASE) MCG/ACT inhaler Inhale 1 puff into the lungs every 4 (four) hours as needed for wheezing or shortness of breath. Ventolin (Patient not taking: Reported on 05/05/2018) 1 Inhaler 2  . benztropine (COGENTIN) 1 MG tablet TAKE 1 TABLET(1 MG) BY MOUTH TWICE DAILY 60 tablet 2  . busPIRone (BUSPAR) 15 MG tablet Take 1 tablet (15 mg total) by mouth 3 (three) times daily. 90 tablet 2  . carvedilol (COREG) 25 MG tablet Take 1 tablet (25 mg total) by mouth 2 (two) times daily with a meal. 60 tablet 2  . fluticasone (FLONASE) 50 MCG/ACT  nasal spray Place 2 sprays into both nostrils daily.    . hydrOXYzine (ATARAX/VISTARIL) 50 MG tablet Take 1 tablet (50 mg total) by mouth 3 (three) times daily as needed. 90 tablet 2  . losartan (COZAAR) 50 MG tablet Take 1 tablet (50 mg total) by mouth daily. 90 tablet 1  . nicotine (NICODERM CQ - DOSED IN MG/24 HOURS) 21 mg/24hr patch Place 1 patch (21 mg total) onto the skin daily. (Patient not taking: Reported on 10/19/2019) 28 patch 0  . omega-3 acid ethyl esters (LOVAZA) 1 g capsule Take 1 capsule (1 g total) by mouth 2 (two) times daily. 60 capsule 5  . pantoprazole (PROTONIX) 20 MG tablet Take 20 mg by mouth daily.    . Prenatal Vit-Fe Fumarate-FA (PRENATAL MULTIVITAMIN) TABS tablet Take 1 tablet by mouth daily at 12 noon. 90 tablet 1  . QUEtiapine (SEROQUEL) 400 MG tablet Take 1 tablet (400 mg total) by mouth at bedtime. 30 tablet 2  . Umeclidinium-Vilanterol (ANORO ELLIPTA) 62.5-25 MCG/INH AEPB Inhale 1 puff into the lungs daily. (Patient not taking: Reported on 05/05/2018) 30 each 11   No current facility-administered medications for this visit.     Musculoskeletal: Strength & Muscle Tone: Unable to assess due to telephone visit Gait & Station: Unable to assess due to telephone visit Patient leans: N/A  Psychiatric Specialty Exam: Review of Systems  There were no vitals taken for this visit.There is no height or weight on file to calculate BMI.  General Appearance: Unable to assess due to telephone visit  Eye Contact:  Unable to assess due to telephone visit  Speech:  Clear and Coherent and Normal Rate  Volume:  Normal  Mood:  Anxious and Depressed  Affect:  Appropriate and Congruent  Thought Process:  Coherent, Goal Directed and Linear  Orientation:  Full (Time, Place, and Person)  Thought Content: Logical, Hallucinations: Auditory, Ideas of Reference:   Paranoia Delusions and Paranoid Ideation   Suicidal  Thoughts:  No  Homicidal Thoughts:  No  Memory:  Immediate;    Good Recent;   Good Remote;   Good  Judgement:  Good  Insight:  Fair  Psychomotor Activity:  Unable to assess due to telephone visit  Concentration:  Concentration: Good and Attention Span: Good  Recall:  Good  Fund of Knowledge: Good  Language: Good  Akathisia:  Unable to assess due to telephone visit  Handed:  Right  AIMS (if indicated): Not done  Assets:  Communication Skills Desire for Improvement Financial Resources/Insurance Housing Leisure Time Social Support  ADL's:  Intact  Cognition: WNL  Sleep:  Poor   Screenings: AIMS   Flowsheet Row [facility] (Dis[facility]) [facility]  AIMS Total Score 0 0    AUDIT   Flowsheet Row Admission (Discharged) [facility]  Alcohol Use Disorder Identification Test Final Score (AUDIT) 5 4    GAD-7   Flowsheet Row Video Visit from 11/08/2020 in [facility] Video Visit from 10/02/2020 in [facility] Counselor from 06/26/2020 in [facility]  Total GAD-7 Score 21 21 16     PHQ2-9   Flowsheet Row Video Visit from 11/08/2020 in [facility] Video Visit from 10/02/2020 in [facility] Counselor from 06/26/2020 in [facility]  PHQ-2 Total Score 6 4 4   PHQ-9 Total Score 24 23 17     Flowsheet Row Video Visit from 11/08/2020 in [facility] Admission (Discharged) from [facility] (Dis[facility]  C-SSRS RISK CATEGORY No Risk No Risk  High Risk       Assessment and Plan: Patient endorses symptoms of anxiety, depression, AH, and paranoia.  Today she is agreeable to increasing Seroquel 200 mg to 400 mg to help manage sleep, AH, and depression. She is also agreeable to start Klonopin 0.5 mg twice daily as needed to help manage anxiety. Provider informed patient that Klonopin is for short term use. She endorsed understanding. She is also agreeable to start Depakote 500 mg twice daily to help manage mood.  Provider discussed starting an antidepressant such as Trintellix at next visit if symptoms of anxiety/depression does not improve  1. Schizoaffective disorder, bipolar type (HCC)  Continue- divalproex (DEPAKOTE) 500 MG DR tablet; Take 1 tablet (500 mg total) by mouth 2 (two) times daily.  Dispense: 60 tablet; Refill: 2 Increased- QUEtiapine (SEROQUEL) 400 MG tablet; Take 1 tablet (400 mg total) by mouth at bedtime.  Dispense: 30 tablet; Refill: 2 Continue- benztropine (COGENTIN) 1 MG tablet; TAKE 1 TABLET(1 MG) BY MOUTH TWICE DAILY  Dispense: 60 tablet; Refill: 2  2. GAD (generalized anxiety disorder)  Continue- busPIRone (BUSPAR) 15 MG tablet; Take 1 tablet (15 mg total) by mouth 3 (three) times daily.  Dispense: 90 tablet; Refill: 2 Start- clonazePAM (KLONOPIN) 0.5 MG tablet; Take 1 tablet (0.5 mg total) by mouth 2 (two) times daily.  Dispense: 60 tablet; Refill: 0  Follow up in 3 months Follow up with therapy   [facility], NP 11/08/2020, 9:35 AM

## 2020-11-10 NOTE — Progress Notes (Signed)
   THERAPIST PROGRESS NOTE Virtual Visit via Video Note  I connected with Michaela Wells on 11/07/2020 at  1:00 PM EDT by a video enabled telemedicine application and verified that I am speaking with the correct person using two identifiers.  Location: Patient: home Provider: office   I discussed the limitations of evaluation and management by telemedicine and the availability of in person appointments. The patient expressed understanding and agreed to proceed.   Follow Up Instructions: I discussed the assessment and treatment plan with the patient. The patient was provided an opportunity to ask questions and all were answered. The patient agreed with the plan and demonstrated an understanding of the instructions.   The patient was advised to call back or seek an in-person evaluation if the symptoms worsen or if the condition fails to improve as anticipated.   Session Time: 25 minutes  Participation Level: Active  Behavioral Response: NAAlertDepressed  Type of Therapy: Individual Therapy  Treatment Goals addressed: Anxiety  Interventions: CBT  Summary:  Michaela Wells is a 60 y.o. female who presents for the scheduled session oriented times fives, appropriately dressed, and friendly. Client reported hallucinations and delusions. Client reported on today she has been feeling irritable, depressed and anxious. Client reported she has been feeling anxious about the harm she believes her previous neighbors from [place] are going to do to her daily. Client reported she believes they have control over her body making her hurt. Client reported "my anxiety gets so high I think I'm going to pass out". Client reported "the voices say they will hurt me". Client reported she spends a lot of time sitting on the side of her bed waiting for the voices to do something. Client reported she has tried previous coping skills of trying to minimize the hallucinations by use of music, TV, or controlled  breathing but it doe snot help. Client reported she struggles with self care as well. Client denied wanting to harm herself or feeling like a threat to herself. Client reported her protective factors of her family and her son who helps to look after her. Client reported she wants to be around to eventually see her new grandchild. Client reported she would like a medication to help with her anxiety.    Suicidal/Homicidal: Nowithout intent/plan  Therapist Response:  Therapist began the session checking in and asking the client how she has been doing. Therapist asked open ended questions to assess the severity of the clients symptoms. Therapist used empathy to help keep the client calm and strengthen the therapeutic bond. Therapist used CBT to teach the client calming practices to help with anxiety for homework. Therapist spoke with the clients psychiatrist to inform of the clients current condition. Client was scheduled for next appointment.    Plan: Return again in 4 weeks for individual therapy.   Diagnosis: Schizoaffective disorder, bipolar type  Michaela Wells Michaela Smola, LCSW 11/07/2020

## 2020-11-24 ENCOUNTER — Other Ambulatory Visit (HOSPITAL_COMMUNITY): Payer: Self-pay | Admitting: Psychiatry

## 2020-11-24 DIAGNOSIS — F411 Generalized anxiety disorder: Secondary | ICD-10-CM

## 2020-11-24 DIAGNOSIS — F250 Schizoaffective disorder, bipolar type: Secondary | ICD-10-CM

## 2020-11-24 DIAGNOSIS — F315 Bipolar disorder, current episode depressed, severe, with psychotic features: Secondary | ICD-10-CM

## 2020-11-28 ENCOUNTER — Ambulatory Visit (INDEPENDENT_AMBULATORY_CARE_PROVIDER_SITE_OTHER): Payer: Medicaid Other | Admitting: Clinical

## 2020-11-28 ENCOUNTER — Ambulatory Visit (HOSPITAL_COMMUNITY): Payer: Self-pay | Admitting: Clinical

## 2020-11-28 ENCOUNTER — Other Ambulatory Visit: Payer: Self-pay

## 2020-11-28 ENCOUNTER — Telehealth (HOSPITAL_COMMUNITY): Payer: Self-pay | Admitting: Clinical

## 2020-11-28 DIAGNOSIS — F250 Schizoaffective disorder, bipolar type: Secondary | ICD-10-CM

## 2020-11-28 NOTE — Telephone Encounter (Signed)
Spoke with Michaela Wells re her meds, but I was unable to match up her mar with her progress note from yesterday when she saw Dr Doyne Keel. Dr Doyne Keel, please clarify for me and [name] is she supposed to be taking the Remeron? She likes the Seroquel for sleep, it was on the [facility] but not in your note, and also the Zyprexa, in the [facility] but not in the note.

## 2020-11-29 ENCOUNTER — Telehealth (HOSPITAL_COMMUNITY): Payer: Self-pay | Admitting: *Deleted

## 2020-11-29 NOTE — Telephone Encounter (Signed)
Called patient back now that I have clarification from provider on patients meds. Reviewed all of her meds, she verbalized her understanding and encouraged her to put the ones she no longer takes well aside or take them to the pharmacy to destroy.

## 2020-11-29 NOTE — Telephone Encounter (Signed)
Provider attempted to call patient three without success. On 10/02/2020 the patient informed writer that she discontinued Mirtazapine because it made her more depressed.  Due to this provider did not refill the medication. Also on 10/02/2020 patient notes that she was taking Seroquel and Zyprexa together. Provider discussed the side effect that could occur while on two antipsychotics and recommended that patient discontinue Zyprexa 15 mg nightly  which she was agreeable to.  Patient current medications are as followed Seroquel 400 mg nightly, Klonopin 0.5 mg twice daily, Buspar 15 mg three times daily, Cogentin 1 mg 2 times daily, Depakote 500 mg twice daily and hydroxyzine 50 mg 3 times daily as needed.

## 2020-12-02 NOTE — Progress Notes (Signed)
   THERAPIST PROGRESS NOTE Virtual Visit via Video Note  I connected with Michaela Wells on 11/28/2020 at  1:00 PM EDT by a video enabled telemedicine application and verified that I am speaking with the correct person using two identifiers.  Location: Patient: home Provider: office   I discussed the limitations of evaluation and management by telemedicine and the availability of in person appointments. The patient expressed understanding and agreed to proceed.   Follow Up Instructions: I discussed the assessment and treatment plan with the patient. The patient was provided an opportunity to ask questions and all were answered. The patient agreed with the plan and demonstrated an understanding of the instructions.   The patient was advised to call back or seek an in-person evaluation if the symptoms worsen or if the condition fails to improve as anticipated.   Session Time: 45 minutes  Participation Level: Active  Behavioral Response: Fairly GroomedAlertEuthymic  Type of Therapy: Individual Therapy  Treatment Goals addressed: Anxiety  Interventions: CBT  Summary:  Michaela Wells is a 60 y.o. female who presents for the scheduled session oriented times five, appropriately dressed, and friendly. Client reported hallucinations and delusions.  Client reported her days this week has been a little better than before her last session. Client reported the voices have been "quieter". Client reported her granddaughter has talked to her about how she spends most of her time in her room and sitting on the bed. Client reported that made her sad. Client reported she has had a hard time with her self hygiene because she believes the voices that are her former neighbors can see her when she showers. Client reported she showers in her daughters bathroom because it has a curtain and helps her to feel that it blocks them from being able to see her. Client reported her daughter has to prompt her to  shower. Client reported she tries not to think about taking a shower so the voices wont notice. Client reported she has an appointment coming up for a physcial at her PCP to rule out other medical conditions causing her to feel anxious as she has attributed her physical symptoms to the voices having control over her pain. Client reported she has had a desire to go out in public but is scared of judgement and feeling embarrassed about her physical looks. Client reported she would like a nurse and/or doctor to call her because she has forgotten how to take her medications as prescribed.   Suicidal/Homicidal: Nowithout intent/plan  Therapist Response:  Therapist asked the client how she has been since last seen. Therapist used CBT to show acceptance through a calm manner, good eye contact, and active listening as she decribed her thoughts and feelings. Therapist used CBT to make arrangements for the client to be contacted by nursing staff to review her medications. Therapist used CBT to provide reassurance in identifying manageable activities to try to improve the clients social interactions. Client was scheduled for next appointment.    Plan: Return again in 5 weeks for individual therapy.  Diagnosis: Schizoaffective disorder, bipolar type  Michaela Rhymes Reann Dobias, LCSW 11/28/2020

## 2020-12-13 ENCOUNTER — Other Ambulatory Visit (HOSPITAL_COMMUNITY): Payer: Self-pay | Admitting: Psychiatry

## 2020-12-13 DIAGNOSIS — F411 Generalized anxiety disorder: Secondary | ICD-10-CM

## 2020-12-28 ENCOUNTER — Telehealth (HOSPITAL_COMMUNITY): Payer: Medicaid Other | Admitting: Psychiatry

## 2021-01-10 ENCOUNTER — Ambulatory Visit: Payer: Medicaid Other | Admitting: Nurse Practitioner

## 2021-01-11 NOTE — Progress Notes (Signed)
Virtual Visit via Telephone Note  I connected with Michaela Wells, on 01/12/2021 at 2:18 PM by telephone due to the COVID-19 pandemic and verified that I am speaking with the correct person using two identifiers.  Due to current restrictions/limitations of in-office visits due to the COVID-19 pandemic, this scheduled clinical appointment was converted to a telehealth visit.   Consent: I discussed the limitations, risks, security and privacy concerns of performing an evaluation and management service by telephone and the availability of in person appointments. I also discussed with the patient that there may be a patient responsible charge related to this service. The patient expressed understanding and agreed to proceed.   Location of Patient: Home  Location of Provider: [facility]   Persons participating in Telemedicine visit: NOHEA KRAS Ricky Stabs, NP Margorie John, CMA  History of Present Illness: Michaela Wells. [name] is a 60 year-old female who presents to establish care. PMH significant for bronchial asthma, COPD, GERD, depression, frequent headaches, generalized anxiety disorder, bipolar I disorder, PTSD, psychosis, and schizoaffective disorder bipolar type. .   Current issues and/or concerns: 1. HYPERTENSION: Med Adherence: []  Yes    [x]  No 1 year without Losartan because she moved from to  Adherence with salt restriction (low-salt diet): [x]  Yes    []  No Exercise: Yes []  No [x]  Home Monitoring?: [x]  Yes    []  No Monitoring Frequency: [x]  Yes    []  No Home BP results range: [x]  Yes, higher than normal Smoking [x]  Yes, 1 to 2 packs daily  SOB? [x]  Yes, COPD  Chest Pain?: []  Yes    [x]  No Leg swelling?: []  Yes    [x]  No Headaches?: []  Yes    [x]  No Dizziness? [x]  Yes    []  No   2. COPD:  Saw a specialist 1 time  COPD status: worse, has been 1 year without medication because she moved from [place] to [place]  Oxygen use: no Dyspnea frequency: yes  Cough frequency: yes  Productive cough: mucus    Past Medical History:  Diagnosis Date  . Bronchial asthma   . Chicken pox   . COPD (chronic obstructive pulmonary disease) (HCC)   . Depression   . Frequent headaches   . GERD (gastroesophageal reflux disease)   . UTI (lower urinary tract infection)    Allergies  Allergen Reactions  . Penicillins Other (See Comments)    Childhood allergy Has patient had a PCN reaction causing immediate rash, facial/tongue/throat swelling, SOB or lightheadedness with hypotension: Unknown Has patient had a PCN reaction causing severe rash involving mucus membranes or skin necrosis: Unknown Has patient had a PCN reaction that required hospitalization: Unknown Has patient had a PCN reaction occurring within the last 10 years: Unknown If all of the above answers are "NO", then may proceed with Cephalosporin use.      Current Outpatient Medications on File Prior to Visit  Medication Sig Dispense Refill  . albuterol (PROVENTIL HFA;VENTOLIN HFA) 108 (90 BASE) MCG/ACT inhaler Inhale 1 puff into the lungs every 4 (four) hours as needed for wheezing or shortness of breath. Ventolin 1 Inhaler 2  . benztropine (COGENTIN) 1 MG tablet TAKE 1 TABLET(1 MG) BY MOUTH TWICE DAILY 60 tablet 2  . busPIRone (BUSPAR) 15 MG tablet Take 1 tablet (15 mg total) by mouth 3 (three) times daily. 90 tablet 2  . carvedilol (COREG) 25 MG tablet Take 1 tablet (25 mg total) by  mouth 2 (two) times daily with a meal. 60 tablet 2  . clonazePAM (KLONOPIN) 0.5 MG tablet TAKE 1 TABLET(0.5 MG) BY MOUTH TWICE DAILY 60 tablet 2  . divalproex (DEPAKOTE) 500 MG DR tablet Take 1 tablet (500 mg total) by mouth 2 (two) times daily. 60 tablet 2  . hydrOXYzine (ATARAX/VISTARIL) 50 MG tablet Take 50 mg by mouth 3 (three) times daily.    Marland Kitchen losartan (COZAAR) 50 MG tablet Take 1 tablet (50 mg total) by mouth daily. 90 tablet 1  . mirtazapine (REMERON)  15 MG tablet TAKE 1 TABLET(15 MG) BY MOUTH AT BEDTIME 30 tablet 2  . omega-3 acid ethyl esters (LOVAZA) 1 g capsule Take 1 capsule (1 g total) by mouth 2 (two) times daily. 60 capsule 5  . pantoprazole (PROTONIX) 20 MG tablet Take 20 mg by mouth daily.    . QUEtiapine (SEROQUEL) 400 MG tablet Take 1 tablet (400 mg total) by mouth at bedtime. 30 tablet 2  . Umeclidinium-Vilanterol (ANORO ELLIPTA) 62.5-25 MCG/INH AEPB Inhale 1 puff into the lungs daily. 30 each 11  . fluticasone (FLONASE) 50 MCG/ACT nasal spray Place 2 sprays into both nostrils daily. (Patient not taking: Reported on 01/12/2021)    . nicotine (NICODERM CQ - DOSED IN MG/24 HOURS) 21 mg/24hr patch Place 1 patch (21 mg total) onto the skin daily. (Patient not taking: No sig reported) 28 patch 0  . OLANZapine (ZYPREXA) 15 MG tablet TAKE 1 TABLET(15 MG) BY MOUTH DAILY (Patient not taking: Reported on 01/12/2021) 30 tablet 2   No current facility-administered medications on file prior to visit.    Observations/Objective: Alert and oriented x 3. Not in acute distress. Physical examination not completed as this is a telemedicine visit.  Assessment and Plan: 1. Encounter to establish care: - Patient presents today to establish care.  - Return for annual physical examination, labs, and health maintenance. Arrive fasting meaning having no food for at least 8 hours prior to appointment. You may have only water or black coffee. Please take scheduled medications as normal.  2. Essential hypertension: - Begin Hydrochlorothiazide as prescribed.  - Counseled on blood pressure goal of less than 130/80, low-sodium, DASH diet, medication compliance, 150 minutes of moderate intensity exercise per week as tolerated. Discussed medication compliance, adverse effects. - Follow-up in 2 weeks or sooner if needed with primary provider for blood pressure check. Write down your blood pressure readings each day and bring those results along with your home blood  pressure monitor to your appointment.  - hydrochlorothiazide (HYDRODIURIL) 12.5 MG tablet; Take 1 tablet (12.5 mg total) by mouth daily.  Dispense: 30 tablet; Refill: 0  3. Chronic obstructive pulmonary disease, unspecified COPD type (HCC): - Resume Albuterol inhaler and Umeclidinium-Vilanterol as prescribed.  - Referral to Pulmonology for further evaluation and management.  - Ambulatory referral to Pulmonology - albuterol (VENTOLIN HFA) 108 (90 Base) MCG/ACT inhaler; Inhale 1 puff into the lungs every 4 (four) hours as needed for wheezing or shortness of breath. Ventolin  Dispense: 1 each; Refill: 2 - umeclidinium-vilanterol (ANORO ELLIPTA) 62.5-25 MCG/INH AEPB; Inhale 1 puff into the lungs daily.  Dispense: 30 each; Refill: 2   Follow Up Instructions: Return for annual physical exam.   Patient was given clear instructions to go to Emergency Department or return to medical center if symptoms don't improve, worsen, or new problems develop.The patient verbalized understanding.  I discussed the assessment and treatment plan with the patient. The patient was provided an opportunity to ask  questions and all were answered. The patient agreed with the plan and demonstrated an understanding of the instructions.   The patient was advised to call back or seek an in-person evaluation if the symptoms worsen or if the condition fails to improve as anticipated.   I provided 15 minutes total of non-face-to-face time during this encounter.   [name], NP  [facility] [place], [place] [phone] 01/12/2021, 2:18 PM

## 2021-01-12 ENCOUNTER — Telehealth (INDEPENDENT_AMBULATORY_CARE_PROVIDER_SITE_OTHER): Payer: Medicaid Other | Admitting: Family

## 2021-01-12 ENCOUNTER — Other Ambulatory Visit: Payer: Self-pay

## 2021-01-12 DIAGNOSIS — J449 Chronic obstructive pulmonary disease, unspecified: Secondary | ICD-10-CM | POA: Diagnosis not present

## 2021-01-12 DIAGNOSIS — Z7689 Persons encountering health services in other specified circumstances: Secondary | ICD-10-CM | POA: Diagnosis not present

## 2021-01-12 DIAGNOSIS — I10 Essential (primary) hypertension: Secondary | ICD-10-CM | POA: Diagnosis not present

## 2021-01-12 MED ORDER — HYDROCHLOROTHIAZIDE 12.5 MG PO TABS: 12.5000 mg | ORAL_TABLET | Freq: Every day | ORAL | 0 refills | Status: DC

## 2021-01-12 MED ORDER — ANORO ELLIPTA 62.5-25 MCG/INH IN AEPB: 1.0000 | INHALATION_SPRAY | Freq: Every day | RESPIRATORY_TRACT | 2 refills | Status: DC

## 2021-01-12 MED ORDER — ALBUTEROL SULFATE HFA 108 (90 BASE) MCG/ACT IN AERS: 1.0000 | INHALATION_SPRAY | RESPIRATORY_TRACT | 2 refills | Status: DC | PRN

## 2021-01-12 NOTE — Progress Notes (Signed)
Establish care Needs albuterol inhaler and anoro ellipta, Losartan refilled

## 2021-01-12 NOTE — Addendum Note (Signed)
Addended by: [name] on: 01/12/2021 04:42 PM   Modules accepted: Orders

## 2021-01-15 ENCOUNTER — Telehealth: Payer: Self-pay | Admitting: Family

## 2021-01-15 NOTE — Telephone Encounter (Signed)
Copied from CRM [id number]. Topic: General - Inquiry >> Jan 15, 2021  2:38 PM Daphine Deutscher D wrote: Reason for CRM: Pt had a virtual on Friday Amy Stephens.  She is asking about the rx's that should have been sent in.  Ventolin and Anoro?  She ask that they been sent to [facility] on  [address] .  She also ask about having some xrays done.  She has not heard about those being scheduled.  Please call patient back at [phone]

## 2021-01-16 ENCOUNTER — Other Ambulatory Visit: Payer: Self-pay | Admitting: Family

## 2021-01-16 DIAGNOSIS — J449 Chronic obstructive pulmonary disease, unspecified: Secondary | ICD-10-CM

## 2021-01-16 DIAGNOSIS — I10 Essential (primary) hypertension: Secondary | ICD-10-CM

## 2021-01-16 MED ORDER — ALBUTEROL SULFATE HFA 108 (90 BASE) MCG/ACT IN AERS: 1.0000 | INHALATION_SPRAY | RESPIRATORY_TRACT | 2 refills | Status: DC | PRN

## 2021-01-16 MED ORDER — HYDROCHLOROTHIAZIDE 12.5 MG PO TABS: 12.5000 mg | ORAL_TABLET | Freq: Every day | ORAL | 0 refills | Status: DC

## 2021-01-16 MED ORDER — ANORO ELLIPTA 62.5-25 MCG/INH IN AEPB: 1.0000 | INHALATION_SPRAY | Freq: Every day | RESPIRATORY_TRACT | 2 refills | Status: DC

## 2021-01-16 NOTE — Telephone Encounter (Signed)
Hydrochlorothiazide, Albuterol, and Ellipta sent to updated pharmacy per patient request.

## 2021-01-16 NOTE — Telephone Encounter (Signed)
On 01/12/2021 medications were sent to the pharmacy on file for the patient being:  [facility] [address] [place], [place] - [address] (Ph: [phone])  If patient would like medications sent to another pharmacy please obtain location and I will resend.   On the same date patient was referred to Pulmonology for management of COPD. Their office should call within 2 weeks for appointment details.

## 2021-01-16 NOTE — Telephone Encounter (Signed)
Pt called today asking about the rx's that she thought were sent in Friday .  She had a telemedician visit  Friday with Amy Zonia Kief  She thinks they were sent to the wrong pharmacy, but I do not see that they were sent anywhere.  She is also asking about a referral appt for labs and xrays for her copd.  CB#  [phone]

## 2021-02-05 ENCOUNTER — Telehealth (INDEPENDENT_AMBULATORY_CARE_PROVIDER_SITE_OTHER): Payer: Medicaid Other | Admitting: Psychiatry

## 2021-02-05 ENCOUNTER — Other Ambulatory Visit: Payer: Self-pay

## 2021-02-05 ENCOUNTER — Encounter (HOSPITAL_COMMUNITY): Payer: Self-pay | Admitting: Psychiatry

## 2021-02-05 DIAGNOSIS — F250 Schizoaffective disorder, bipolar type: Secondary | ICD-10-CM | POA: Diagnosis not present

## 2021-02-05 DIAGNOSIS — F411 Generalized anxiety disorder: Secondary | ICD-10-CM | POA: Diagnosis not present

## 2021-02-05 DIAGNOSIS — F17200 Nicotine dependence, unspecified, uncomplicated: Secondary | ICD-10-CM | POA: Diagnosis not present

## 2021-02-05 MED ORDER — DULOXETINE HCL 20 MG PO CPEP: 20.0000 mg | ORAL_CAPSULE | Freq: Every day | ORAL | 2 refills | Status: DC

## 2021-02-05 MED ORDER — CLONAZEPAM 0.5 MG PO TABS: ORAL_TABLET | ORAL | 2 refills | Status: DC

## 2021-02-05 MED ORDER — NICOTINE 21 MG/24HR TD PT24: 21.0000 mg | MEDICATED_PATCH | Freq: Every day | TRANSDERMAL | 0 refills | Status: DC

## 2021-02-05 MED ORDER — BUSPIRONE HCL 15 MG PO TABS: 15.0000 mg | ORAL_TABLET | Freq: Three times a day (TID) | ORAL | 2 refills | Status: DC

## 2021-02-05 MED ORDER — ZIPRASIDONE HCL 20 MG PO CAPS: 40.0000 mg | ORAL_CAPSULE | Freq: Two times a day (BID) | ORAL | 2 refills | Status: DC

## 2021-02-05 MED ORDER — HYDROXYZINE HCL 50 MG PO TABS: 50.0000 mg | ORAL_TABLET | Freq: Three times a day (TID) | ORAL | 2 refills | Status: DC

## 2021-02-05 MED ORDER — BENZTROPINE MESYLATE 1 MG PO TABS: ORAL_TABLET | ORAL | 2 refills | Status: DC

## 2021-02-05 MED ORDER — DIVALPROEX SODIUM 500 MG PO DR TAB: 500.0000 mg | DELAYED_RELEASE_TABLET | Freq: Two times a day (BID) | ORAL | 2 refills | Status: DC

## 2021-02-05 NOTE — Progress Notes (Addendum)
BH MD/PA/NP OP Progress Note Virtual Visit via Telephone Note  I connected with Michaela Wells on 02/05/21 at 11:00 AM EDT by telephone and verified that I am speaking with the correct person using two identifiers.  Location: Patient: home Provider: Clinic   I discussed the limitations, risks, security and privacy concerns of performing an evaluation and management service by telephone and the availability of in person appointments. I also discussed with the patient that there may be a patient responsible charge related to this service. The patient expressed understanding and agreed to proceed.   I provided 30 minutes of non-face-to-face time during this encounter.      02/05/2021 12:09 PM Michaela Wells  MRN:  [id number]  Chief Complaint: "The 400 mg of Seroquel scares me and make me feel like i'm going to die"  HPI: 60 year old female seen today for follow-up psychiatric evaluation.  She has a psychiatric history of schizoaffective disorder, bipolar disorder, anxiety, and depression.  She is currently managed on Buspar 15 mg three times daily, Cogentin 1 mg 2 times daily, Seroquel 400 mg nightly, Klonopin 0.5 mg twice daily, and hydroxyzine 50 mg 3 times daily as needed.  Patient notes that she did not take Seroquel 400 mg consistently.  She notes that she reduced it to 300 mg.  She informed Clinical research associate that her medications are somewhat effective in managing her psychiatric conditions.   Today was unable to login virtually so her exam was done over the phone.  Throughout conversation patient presents with delusions and notes that she has been having problems with auditory hallucinations.  She notes that her neighbors who lives in [place] continues to bother her.  She informed Clinical research associate that it makes her paranoid at night noting that they hurt her hips her arms and her legs.  She informed Clinical research associate that they are constantly in her mind talking negatively about her and requesting that she withhold  information from provider.  She informed Clinical research associate that her neighbors have a Company secretary" from the [employer] which allows them to torment her even though they are encouraging him.   She notes that she isolates and stays in her room as this makes her depressed.  Patient informed Clinical research associate that when she took Seroquel 400 it made her feel strange and she notes that she had an increased fear of dying.    Patient notes that the above stressors exacerbates her anxiety and depression. Provider conducted a GAD-7 and patient scored a 20, at her last visit she scored a 21.  Provider also conducted a PHQ-9 and patient scored a 21, at her last visit she scored a 23. Today she denies SI/HI/VH.   She endorses poor sleep noting that she sleeps 4 hours and then is interrupted every 2 hours by her hallucinations.  She endorses having an adequate appetite.   That she smokes marijuana occasionally.  She notes that it makes her feel more paranoid.  Provider informed patient that paranoia is a side effect of marijuana use as well as other symptoms of psychosis.  She endorsed understanding however notes that it helps her calm down.  Provider encouraged patient to consider discontinuing marijuana to see how it improves her mental health.  Patient found Zyprexa ineffective in the past.  Currently she does not want to increase Seroquel.  Provider asked patient if she would be agreeable to changing her antipsychotics and she was.  Today she is agreeable to starting Geodon 20 mg twice daily to help manage symptoms  of psychosis.  She will start Cymbalta 20 mg to help manage anxiety and depression. Potential side effects of medication and risks vs benefits of treatment vs non-treatment were explained and discussed. All questions were answered. She will continue all other medications as prescribed. No other concerns noted at this time.   Visit Diagnosis:    ICD-10-CM   1. Tobacco use disorder  F17.200 nicotine (NICODERM CQ - DOSED IN MG/24  HOURS) 21 mg/24hr patch    2. Schizoaffective disorder, bipolar type (HCC)  F25.0 benztropine (COGENTIN) 1 MG tablet    divalproex (DEPAKOTE) 500 MG DR tablet    ziprasidone (GEODON) 20 MG capsule    DULoxetine (CYMBALTA) 20 MG capsule    3. GAD (generalized anxiety disorder)  F41.1 hydrOXYzine (ATARAX/VISTARIL) 50 MG tablet    busPIRone (BUSPAR) 15 MG tablet    clonazePAM (KLONOPIN) 0.5 MG tablet    DULoxetine (CYMBALTA) 20 MG capsule      Past Psychiatric History:  schizoaffective disorder, bipolar disorder, anxiety, and depression  Past Medical History:  Past Medical History:  Diagnosis Date   Bronchial asthma    Chicken pox    COPD (chronic obstructive pulmonary disease) (HCC)    Depression    Frequent headaches    GERD (gastroesophageal reflux disease)    UTI (lower urinary tract infection)     Past Surgical History:  Procedure Laterality Date   ESOPHAGEAL DILATION     TONSILLECTOMY      Family Psychiatric History:  Daughter Bipolar and SA, Daughter anxiety, Son anxiety  Family History:  Family History  Problem Relation Age of Onset   Other Mother        MVA-Deceased [pt was age 31]   COPD Father        Deceased   Heart attack Maternal Grandmother    Stroke Maternal Grandmother    Brain cancer Maternal Grandmother    Arthritis/Rheumatoid Maternal Grandmother    Heart attack Maternal Grandfather    Stroke Maternal Grandfather    Heart disease Maternal Aunt    Stroke Maternal Aunt        #1   Heart attack Maternal Aunt        #1   Hypertension Maternal Aunt        #1   Arthritis/Rheumatoid Sister    COPD Sister    Heart disease Maternal Uncle    Heart attack Maternal Uncle        x4   Heart disease Brother        #1   Heart attack Brother        #1   Diabetes Brother    Diabetes Maternal Aunt    Mental retardation Maternal Aunt    Healthy Daughter        x2   Drug abuse Daughter        #1   Drug abuse Daughter        #2   Kidney Stones  Daughter        #2   Other Son        Agoraphobia    Social History:  Social History   Socioeconomic History   Marital status: Single    Spouse name: Not on file   Number of children: Not on file   Years of education: Not on file   Highest education level: Not on file  Occupational History   Not on file  Tobacco Use   Smoking status: Every Day    Pack  years: 0.00   Smokeless tobacco: Never  Substance and Sexual Activity   Alcohol use: Yes   Drug use: Not Currently   Sexual activity: Not Currently  Other Topics Concern   Not on file  Social History Narrative   Not on file   Social Determinants of Health   Financial Resource Strain: Not on file  Food Insecurity: Not on file  Transportation Needs: Not on file  Physical Activity: Not on file  Stress: Not on file  Social Connections: Not on file    Allergies:  Allergies  Allergen Reactions   Penicillins Other (See Comments)    Childhood allergy Has patient had a PCN reaction causing immediate rash, facial/tongue/throat swelling, SOB or lightheadedness with hypotension: Unknown Has patient had a PCN reaction causing severe rash involving mucus membranes or skin necrosis: Unknown Has patient had a PCN reaction that required hospitalization: Unknown Has patient had a PCN reaction occurring within the last 10 years: Unknown If all of the above answers are "NO", then may proceed with Cephalosporin use.      Metabolic Disorder Labs: Lab Results  Component Value Date   HGBA1C 6.7 (H) 05/06/2018   MPG 145.59 05/06/2018   No results found for: PROLACTIN Lab Results  Component Value Date   CHOL 203 (H) 05/06/2018   TRIG 73 05/06/2018   HDL 51 05/06/2018   CHOLHDL 4.0 05/06/2018   VLDL 15 05/06/2018   LDLCALC 137 (H) 05/06/2018   Lab Results  Component Value Date   TSH 3.388 10/23/2019   TSH 1.195 05/06/2018    Therapeutic Level Labs: No results found for: LITHIUM Lab Results  Component Value Date    VALPROATE 86 05/10/2018   No components found for:  CBMZ  Current Medications: Current Outpatient Medications  Medication Sig Dispense Refill   DULoxetine (CYMBALTA) 20 MG capsule Take 1 capsule (20 mg total) by mouth daily. 30 capsule 2   ziprasidone (GEODON) 20 MG capsule Take 2 capsules (40 mg total) by mouth 2 (two) times daily with a meal. 60 capsule 2   albuterol (VENTOLIN HFA) 108 (90 Base) MCG/ACT inhaler Inhale 1 puff into the lungs every 4 (four) hours as needed for wheezing or shortness of breath. Ventolin 1 each 2   benztropine (COGENTIN) 1 MG tablet TAKE 1 TABLET(1 MG) BY MOUTH TWICE DAILY 60 tablet 2   busPIRone (BUSPAR) 15 MG tablet Take 1 tablet (15 mg total) by mouth 3 (three) times daily. 90 tablet 2   carvedilol (COREG) 25 MG tablet Take 1 tablet (25 mg total) by mouth 2 (two) times daily with a meal. 60 tablet 2   clonazePAM (KLONOPIN) 0.5 MG tablet TAKE 1 TABLET(0.5 MG) BY MOUTH TWICE DAILY 60 tablet 2   divalproex (DEPAKOTE) 500 MG DR tablet Take 1 tablet (500 mg total) by mouth 2 (two) times daily. 60 tablet 2   fluticasone (FLONASE) 50 MCG/ACT nasal spray Place 2 sprays into both nostrils daily. (Patient not taking: Reported on 01/12/2021)     hydrochlorothiazide (HYDRODIURIL) 12.5 MG tablet Take 1 tablet (12.5 mg total) by mouth daily. 30 tablet 0   hydrOXYzine (ATARAX/VISTARIL) 50 MG tablet Take 1 tablet (50 mg total) by mouth 3 (three) times daily. 90 tablet 2   losartan (COZAAR) 50 MG tablet Take 1 tablet (50 mg total) by mouth daily. 90 tablet 1   nicotine (NICODERM CQ - DOSED IN MG/24 HOURS) 21 mg/24hr patch Place 1 patch (21 mg total) onto the skin daily. 28 patch  0   omega-3 acid ethyl esters (LOVAZA) 1 g capsule Take 1 capsule (1 g total) by mouth 2 (two) times daily. 60 capsule 5   pantoprazole (PROTONIX) 20 MG tablet Take 20 mg by mouth daily.     umeclidinium-vilanterol (ANORO ELLIPTA) 62.5-25 MCG/INH AEPB Inhale 1 puff into the lungs daily. 30 each 2   No  current facility-administered medications for this visit.     Musculoskeletal: Strength & Muscle Tone:  Unable to assess due to telephone visit Gait & Station:  Unable to assess due to telephone visit Patient leans: N/A  Psychiatric Specialty Exam: Review of Systems  There were no vitals taken for this visit.There is no height or weight on file to calculate BMI.  General Appearance:  Unable to assess due to telephone visit  Eye Contact:   Unable to assess due to telephone visit  Speech:  Clear and Coherent and Normal Rate  Volume:  Normal  Mood:  Anxious and Depressed  Affect:  Appropriate and Congruent  Thought Process:  Coherent, Goal Directed and Linear  Orientation:  Full (Time, Place, and Person)  Thought Content: Logical, Hallucinations: Auditory, Ideas of Reference:   Paranoia Delusions and Paranoid Ideation   Suicidal Thoughts:  No  Homicidal Thoughts:  No  Memory:  Immediate;   Good Recent;   Good Remote;   Good  Judgement:  Good  Insight:  Fair  Psychomotor Activity:   Unable to assess due to telephone visit  Concentration:  Concentration: Good and Attention Span: Good  Recall:  Good  Fund of Knowledge: Good  Language: Good  Akathisia:   Unable to assess due to telephone visit  Handed:  Right  AIMS (if indicated): Not done  Assets:  Communication Skills Desire for Improvement Financial Resources/Insurance Housing Leisure Time Social Support  ADL's:  Intact  Cognition: WNL  Sleep:  Poor   Screenings: AIMS    Flowsheet Row [facility] (Dis[facility]) [facility]) [facility]  AIMS Total Score 0 0      AUDIT    Flowsheet Row Admission (Discharged) [facility] ([facility]  Alcohol  Use Disorder Identification Test Final Score (AUDIT) 5 4      GAD-7    Flowsheet Row [facility]  Total GAD-7 Score 20 21 21 16       PHQ2-9    Flowsheet Row [facility]  PHQ-2 Total Score 6 0 6 4 4   PHQ-9 Total Score 21 0 24 23 17       Flowsheet Row [facility] Ad[facility] (Discharged[facility] from [facility] ([facility]  C-SSRS RISK CATEGORY No Risk No Risk High Risk  Assessment and Plan: Patient endorses symptoms of anxiety, depression, AH, and paranoia.  Seroquel and Zyprexa have been ineffective in the past.  At this time she does not want to increase Seroquel as she notes that higher doses makes her feel strange. Provider asked patient if she would be agreeable to changing her antipsychotics and she was.  Today she is agreeable to starting Geodon 20 mg twice daily to help manage symptoms of psychosis.  She will start Cymbalta 20 mg to help manage anxiety and depression.   1. Schizoaffective disorder, bipolar type (HCC)  Continue- benztropine (COGENTIN) 1 MG tablet; TAKE 1 TABLET(1 MG) BY MOUTH TWICE DAILY  Dispense: 60 tablet; Refill: 2 -Continue divalproex (DEPAKOTE) 500 MG DR tablet; Take 1 tablet (500  mg total) by mouth 2 (two) times daily.  Dispense: 60 tablet; Refill: 2 Start- ziprasidone (GEODON) 20 MG capsule; Take 2 capsules (40 mg total) by mouth 2 (two) times daily with a meal.  Dispense: 60 capsule; Refill: 2 Start- DULoxetine (CYMBALTA) 20 MG capsule; Take 1 capsule (20 mg total) by mouth daily.  Dispense: 30 capsule; Refill: 2  2. GAD (generalized anxiety disorder)  Continue- hydrOXYzine (ATARAX/VISTARIL) 50 MG tablet; Take 1 tablet (50 mg total) by mouth 3 (three) times daily.  Dispense: 90 tablet; Refill: 2 Continue- busPIRone (BUSPAR) 15 MG tablet; Take 1 tablet (15 mg total) by mouth 3 (three) times daily.  Dispense: 90 tablet; Refill: 2 Continue- clonazePAM (KLONOPIN) 0.5 MG tablet; TAKE 1 TABLET(0.5 MG) BY MOUTH TWICE DAILY  Dispense: 60 tablet; Refill: 2 Start- DULoxetine (CYMBALTA) 20 MG capsule; Take 1 capsule (20 mg total) by mouth daily.  Dispense: 30 capsule; Refill: 2  3. Tobacco use disorder  Continue- nicotine (NICODERM CQ - DOSED IN MG/24 HOURS) 21 mg/24hr patch; Place 1 patch (21 mg total) onto the skin daily.  Dispense: 28 patch; Refill: 0   Follow up in 2 months Follow up with therapy   Shanna Cisco, NP 02/05/2021, 12:09 PM

## 2021-02-06 ENCOUNTER — Other Ambulatory Visit (HOSPITAL_COMMUNITY): Payer: Self-pay | Admitting: Psychiatry

## 2021-02-06 DIAGNOSIS — F315 Bipolar disorder, current episode depressed, severe, with psychotic features: Secondary | ICD-10-CM

## 2021-02-09 ENCOUNTER — Telehealth (HOSPITAL_COMMUNITY): Payer: Medicaid Other | Admitting: Psychiatry

## 2021-02-13 ENCOUNTER — Other Ambulatory Visit: Payer: Self-pay | Admitting: Family

## 2021-02-13 DIAGNOSIS — I10 Essential (primary) hypertension: Secondary | ICD-10-CM

## 2021-02-26 ENCOUNTER — Observation Stay (HOSPITAL_COMMUNITY)
Admission: EM | Admit: 2021-02-26 | Discharge: 2021-02-27 | Disposition: A | Discharge status: Home / Self Care | Payer: Medicaid Other | Attending: Family Medicine | Admitting: Family Medicine

## 2021-02-26 ENCOUNTER — Encounter (HOSPITAL_COMMUNITY): Payer: Self-pay | Admitting: Emergency Medicine

## 2021-02-26 ENCOUNTER — Emergency Department (HOSPITAL_COMMUNITY): Payer: Medicaid Other

## 2021-02-26 ENCOUNTER — Other Ambulatory Visit: Payer: Self-pay

## 2021-02-26 DIAGNOSIS — N179 Acute kidney failure, unspecified: Secondary | ICD-10-CM

## 2021-02-26 DIAGNOSIS — E876 Hypokalemia: Secondary | ICD-10-CM | POA: Diagnosis not present

## 2021-02-26 DIAGNOSIS — Z20822 Contact with and (suspected) exposure to covid-19: Secondary | ICD-10-CM | POA: Diagnosis not present

## 2021-02-26 DIAGNOSIS — Z79899 Other long term (current) drug therapy: Secondary | ICD-10-CM | POA: Diagnosis not present

## 2021-02-26 DIAGNOSIS — J449 Chronic obstructive pulmonary disease, unspecified: Secondary | ICD-10-CM | POA: Insufficient documentation

## 2021-02-26 DIAGNOSIS — Z7982 Long term (current) use of aspirin: Secondary | ICD-10-CM | POA: Diagnosis not present

## 2021-02-26 DIAGNOSIS — K219 Gastro-esophageal reflux disease without esophagitis: Secondary | ICD-10-CM | POA: Diagnosis present

## 2021-02-26 DIAGNOSIS — R441 Visual hallucinations: Secondary | ICD-10-CM | POA: Diagnosis not present

## 2021-02-26 DIAGNOSIS — F17200 Nicotine dependence, unspecified, uncomplicated: Secondary | ICD-10-CM | POA: Insufficient documentation

## 2021-02-26 DIAGNOSIS — F29 Unspecified psychosis not due to a substance or known physiological condition: Secondary | ICD-10-CM | POA: Diagnosis present

## 2021-02-26 DIAGNOSIS — R638 Other symptoms and signs concerning food and fluid intake: Secondary | ICD-10-CM | POA: Diagnosis present

## 2021-02-26 DIAGNOSIS — Y900 Blood alcohol level of less than 20 mg/100 ml: Secondary | ICD-10-CM | POA: Insufficient documentation

## 2021-02-26 DIAGNOSIS — J45909 Unspecified asthma, uncomplicated: Secondary | ICD-10-CM | POA: Insufficient documentation

## 2021-02-26 DIAGNOSIS — F315 Bipolar disorder, current episode depressed, severe, with psychotic features: Secondary | ICD-10-CM | POA: Diagnosis present

## 2021-02-26 DIAGNOSIS — R440 Auditory hallucinations: Secondary | ICD-10-CM | POA: Diagnosis not present

## 2021-02-26 LAB — CBC WITH DIFFERENTIAL/PLATELET
Abs Immature Granulocytes: 0.04 10*3/uL (ref 0.00–0.07)
Basophils Absolute: 0 10*3/uL (ref 0.0–0.1)
Basophils Relative: 1 %
Eosinophils Absolute: 0 10*3/uL (ref 0.0–0.5)
Eosinophils Relative: 0 %
HCT: 41.9 % (ref 36.0–46.0)
Hemoglobin: 14 g/dL (ref 12.0–15.0)
Immature Granulocytes: 1 %
Lymphocytes Relative: 32 %
Lymphs Abs: 2.4 10*3/uL (ref 0.7–4.0)
MCH: 29.5 pg (ref 26.0–34.0)
MCHC: 33.4 g/dL (ref 30.0–36.0)
MCV: 88.4 fL (ref 80.0–100.0)
Monocytes Absolute: 1.2 10*3/uL — ABNORMAL HIGH (ref 0.1–1.0)
Monocytes Relative: 16 %
Neutro Abs: 3.8 10*3/uL (ref 1.7–7.7)
Neutrophils Relative %: 50 %
Platelets: 198 10*3/uL (ref 150–400)
RBC: 4.74 MIL/uL (ref 3.87–5.11)
RDW: 13 % (ref 11.5–15.5)
WBC: 7.5 10*3/uL (ref 4.0–10.5)
nRBC: 0 % (ref 0.0–0.2)

## 2021-02-26 LAB — COMPREHENSIVE METABOLIC PANEL
ALT: 24 U/L (ref 0–44)
AST: 41 U/L (ref 15–41)
Albumin: 4.3 g/dL (ref 3.5–5.0)
Alkaline Phosphatase: 67 U/L (ref 38–126)
Anion gap: 14 (ref 5–15)
BUN: 24 mg/dL — ABNORMAL HIGH (ref 6–20)
CO2: 29 mmol/L (ref 22–32)
Calcium: 8.7 mg/dL — ABNORMAL LOW (ref 8.9–10.3)
Chloride: 97 mmol/L — ABNORMAL LOW (ref 98–111)
Creatinine, Ser: 1.81 mg/dL — ABNORMAL HIGH (ref 0.44–1.00)
GFR, Estimated: 32 mL/min — ABNORMAL LOW (ref >60)
Glucose, Bld: 106 mg/dL — ABNORMAL HIGH (ref 70–99)
Potassium: 2.5 mmol/L — CL (ref 3.5–5.1)
Sodium: 140 mmol/L (ref 135–145)
Total Bilirubin: 1 mg/dL (ref 0.3–1.2)
Total Protein: 7.7 g/dL (ref 6.5–8.1)

## 2021-02-26 LAB — CBC
HCT: 37.3 % (ref 36.0–46.0)
Hemoglobin: 12.2 g/dL (ref 12.0–15.0)
MCH: 29.6 pg (ref 26.0–34.0)
MCHC: 32.7 g/dL (ref 30.0–36.0)
MCV: 90.5 fL (ref 80.0–100.0)
Platelets: 200 10*3/uL (ref 150–400)
RBC: 4.12 MIL/uL (ref 3.87–5.11)
RDW: 13.2 % (ref 11.5–15.5)
WBC: 7.5 10*3/uL (ref 4.0–10.5)
nRBC: 0 % (ref 0.0–0.2)

## 2021-02-26 LAB — ETHANOL: Alcohol, Ethyl (B): <10 mg/dL (ref <10)

## 2021-02-26 LAB — CREATININE, SERUM
Creatinine, Ser: 1.57 mg/dL — ABNORMAL HIGH (ref 0.44–1.00)
GFR, Estimated: 38 mL/min — ABNORMAL LOW (ref >60)

## 2021-02-26 LAB — SALICYLATE LEVEL: Salicylate Lvl: <7 mg/dL — ABNORMAL LOW (ref 7.0–30.0)

## 2021-02-26 LAB — ACETAMINOPHEN LEVEL: Acetaminophen (Tylenol), Serum: <10 ug/mL — ABNORMAL LOW (ref 10–30)

## 2021-02-26 LAB — HIV ANTIBODY (ROUTINE TESTING W REFLEX): HIV Screen 4th Generation wRfx: NONREACTIVE

## 2021-02-26 LAB — MAGNESIUM: Magnesium: 2.7 mg/dL — ABNORMAL HIGH (ref 1.7–2.4)

## 2021-02-26 LAB — VALPROIC ACID LEVEL: Valproic Acid Lvl: 60 ug/mL (ref 50.0–100.0)

## 2021-02-26 IMAGING — DX DG CHEST 1V PORT
1 series · 1 of 1 positions shown · non-contrast
Comparison: [DATE].

CLINICAL DATA: Altered mental status. Coughing and shortness of
breath for several days.

EXAM:
PORTABLE CHEST 1 VIEW

[chest ap]
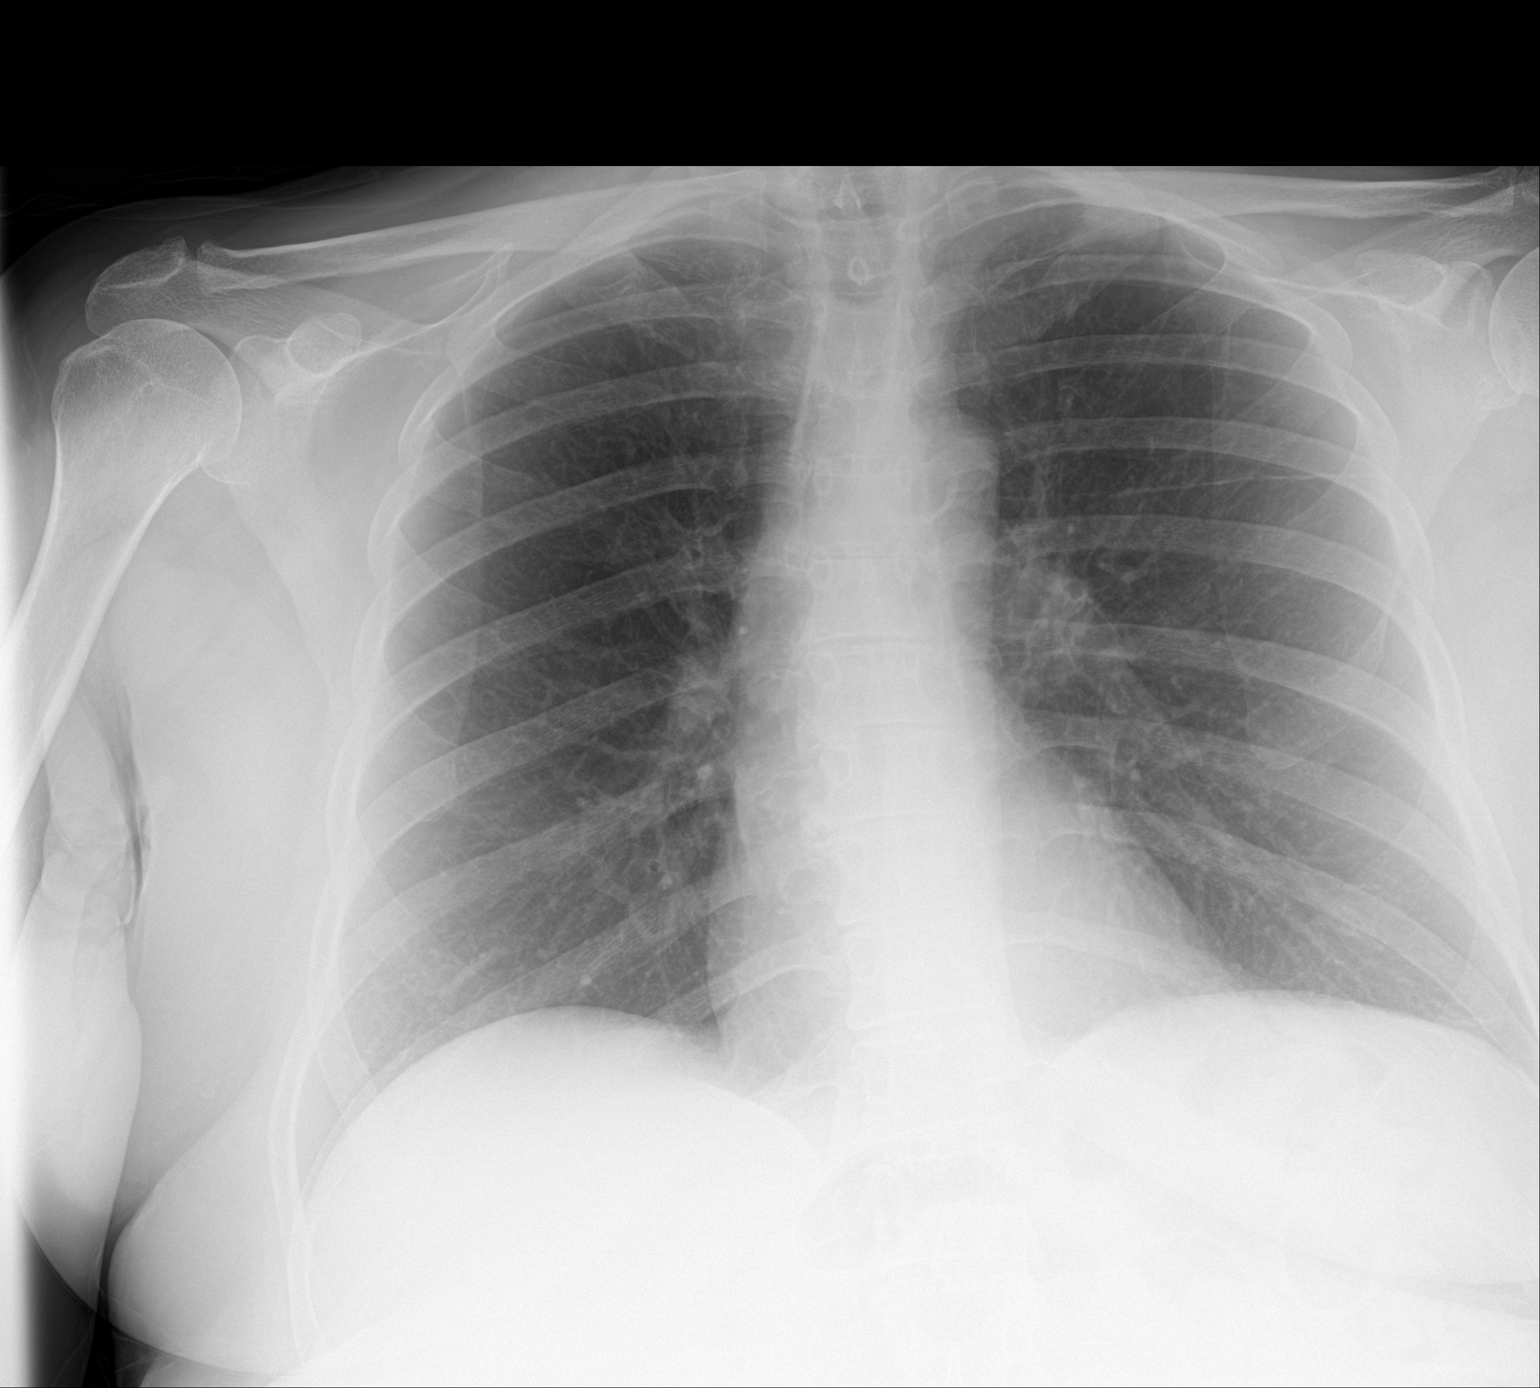

[1 of 1 positions shown; findings below may reference images not displayed]

FINDINGS: Cardiac silhouette is normal in size. Normal mediastinal and hilar
contours.

Clear lungs.  No convincing pleural effusion or pneumothorax.

Skeletal structures are grossly intact.
IMPRESSION: No active disease.

## 2021-02-26 IMAGING — CT CT HEAD W/O CM
3 series · 15 of 47 positions shown, 18 images · non-contrast
Comparison: MR head [DATE]

CLINICAL DATA: Mental status change.  Psychosis.

EXAM:
CT HEAD WITHOUT CONTRAST
TECHNIQUE: Contiguous axial images were obtained from the base of the skull
through the vertex without intravenous contrast.

[Series 2: head wo · axial · 0.43mm/px · z∈[-127,-2]mm · 9 of 30 slices shown, 12 images]
[im 3/30  brain]
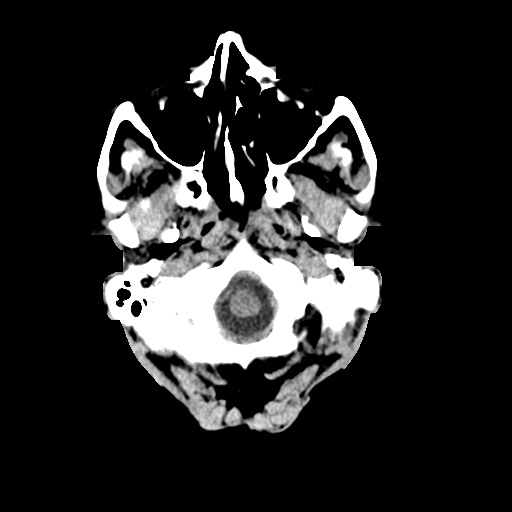
[im 3/30  bone]
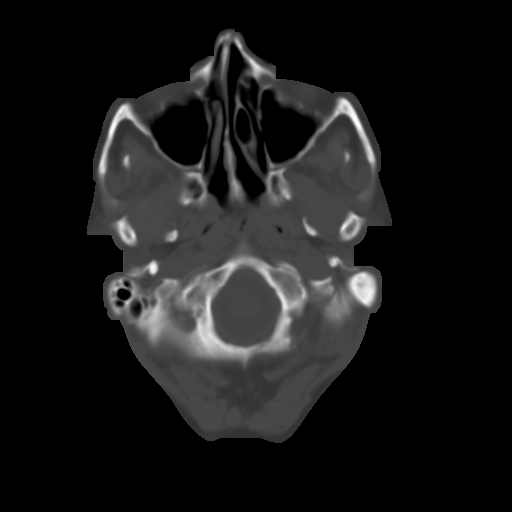
[im 6/30  brain]
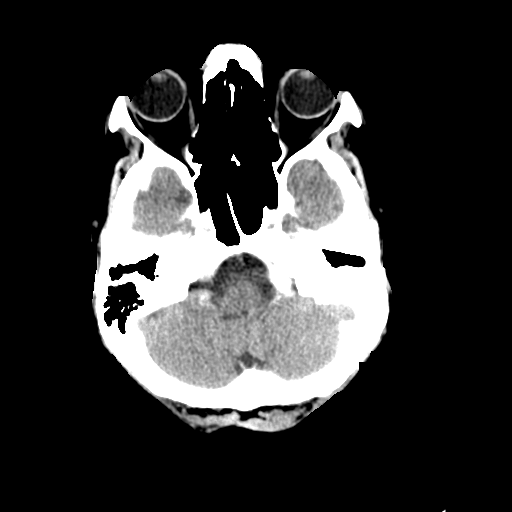
[im 9/30  brain]
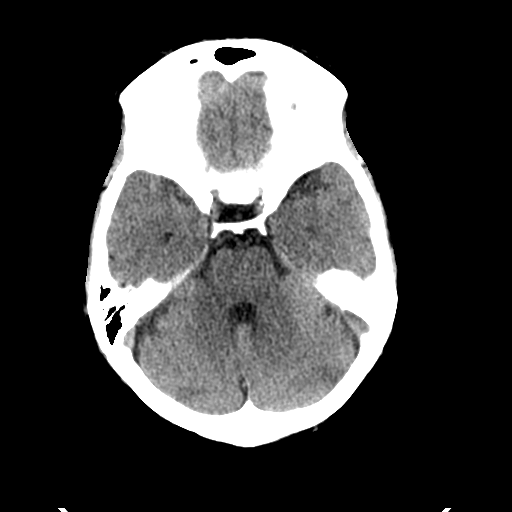
[im 12/30  brain]
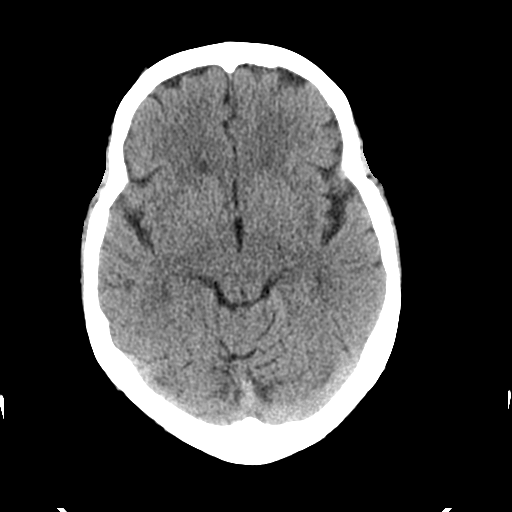
[im 16/30  brain]
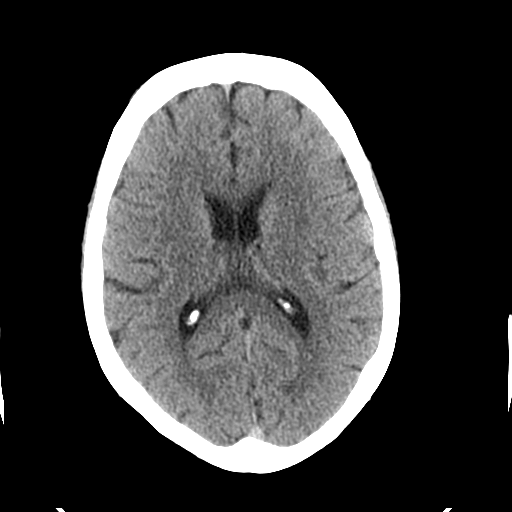
[im 16/30  bone]
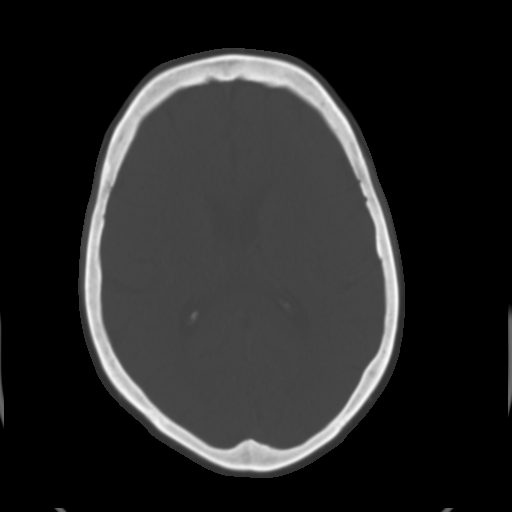
[im 19/30  brain]
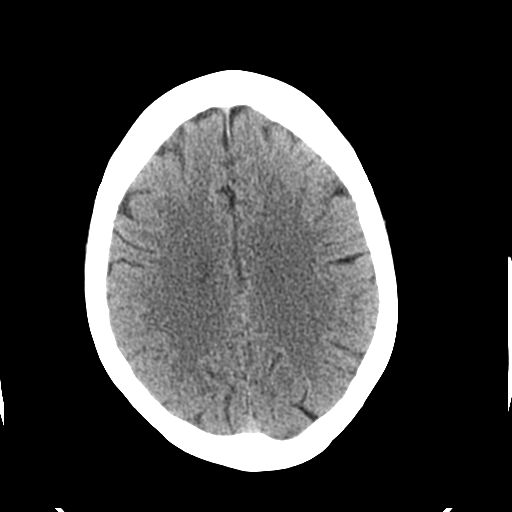
[im 22/30  brain]
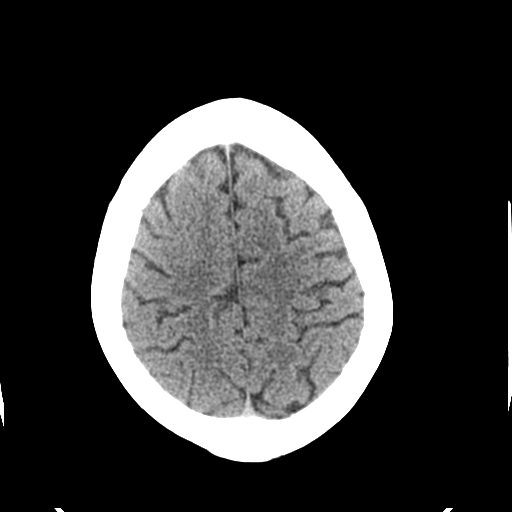
[im 25/30  brain]
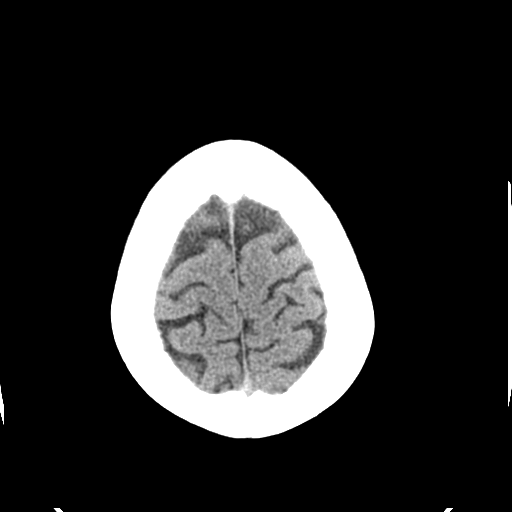
[im 28/30  brain]
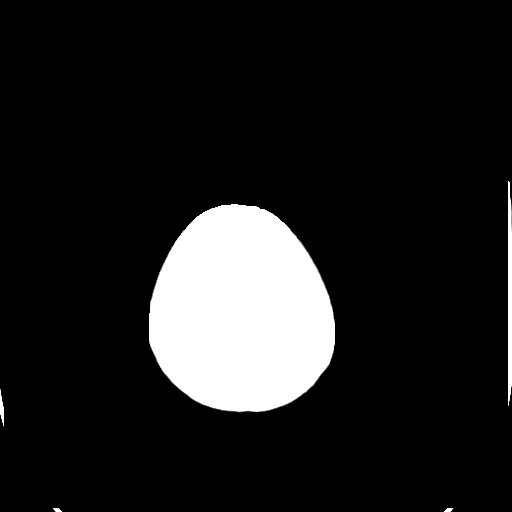
[im 28/30  bone]
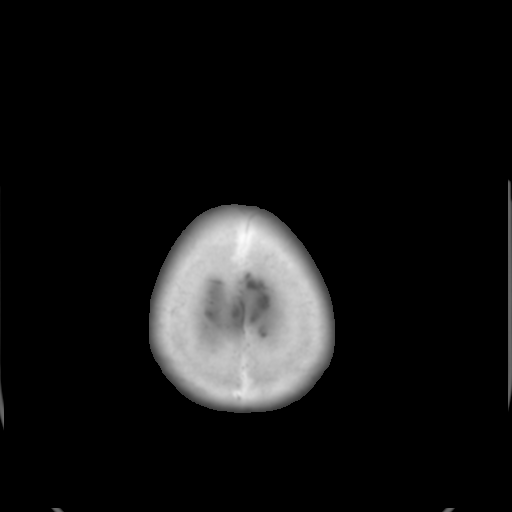

[Series 4: coronal soft tissue · coronal · 0.32mm/px · 3 of 73 slices shown]
[im 25/73  brain]
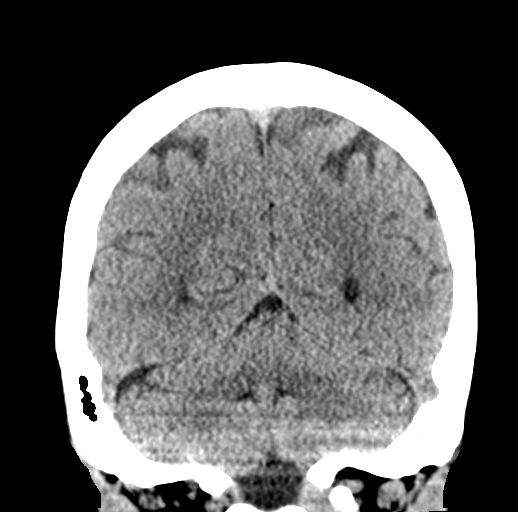
[im 33/73  brain]
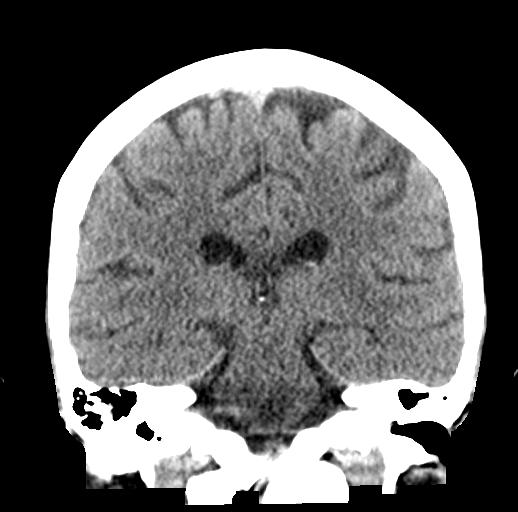
[im 41/73  brain]
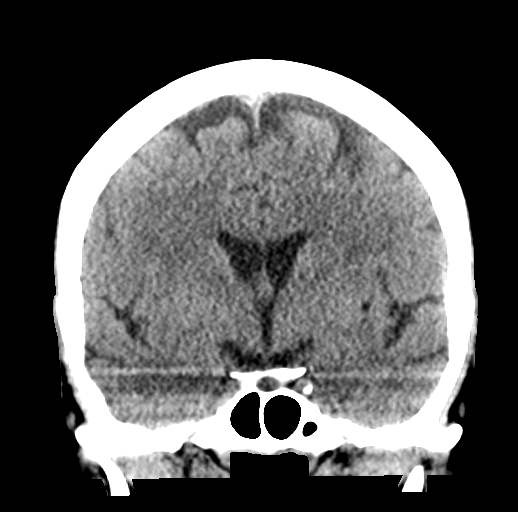

[Series 5: sagittal soft tissue · sagittal · 0.29mm/px · 3 of 53 slices shown]
[im 18/53  brain]
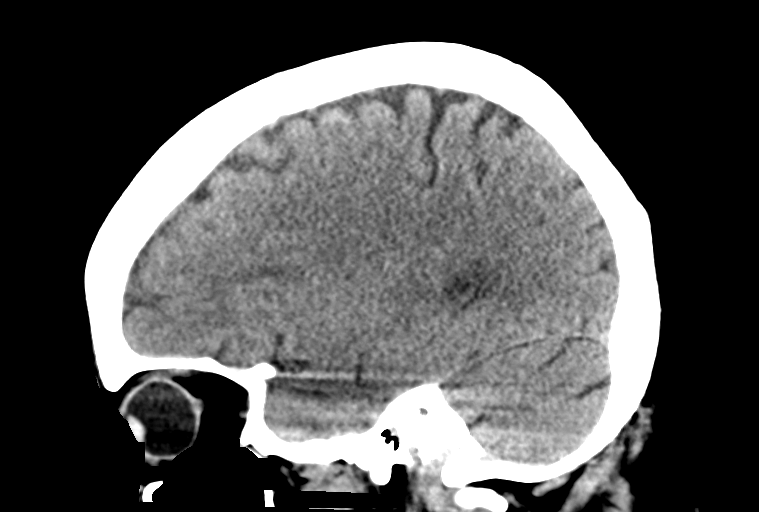
[im 27/53  brain]
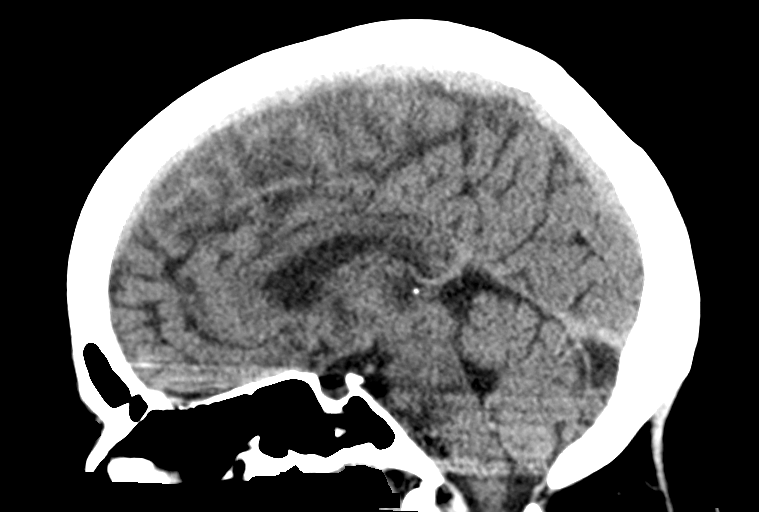
[im 35/53  brain]
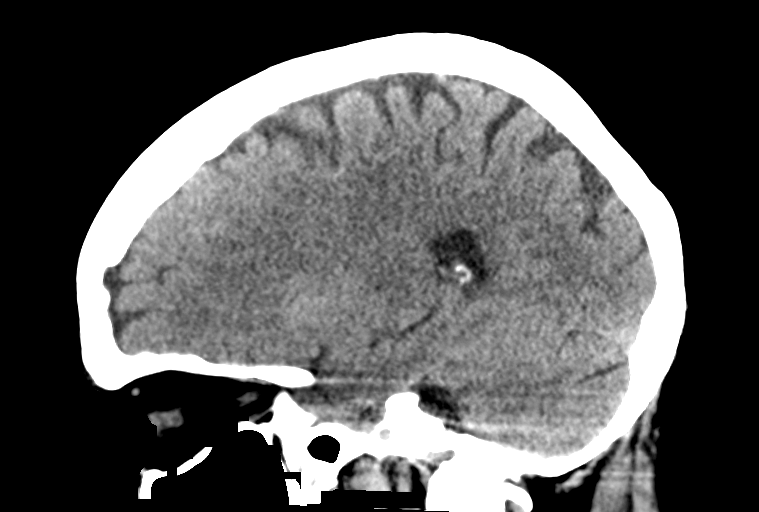

[15 of 47 positions shown; findings below may reference images not displayed]

FINDINGS: Brain: No evidence of acute infarction, hemorrhage, hydrocephalus,
extra-axial collection or mass lesion/mass effect.

Vascular: No hyperdense vessel or unexpected calcification.

Skull: Normal. Negative for fracture or focal lesion.

Sinuses/Orbits: Paranasal sinuses are clear. Left mastoid air cell
effusion

Other: None
IMPRESSION: 1. No acute intracranial abnormalities.
2. Left mastoid air cell effusion.

## 2021-02-26 MED ORDER — MAGNESIUM HYDROXIDE 400 MG/5ML PO SUSP: 30.0000 mL | Freq: Every day | ORAL | Status: DC | PRN

## 2021-02-26 MED ORDER — POTASSIUM CHLORIDE CRYS ER 20 MEQ PO TBCR
40.0000 meq | EXTENDED_RELEASE_TABLET | Freq: Two times a day (BID) | ORAL | Status: DC
  Administered 2021-02-26 – 2021-02-27 (×2): 40 meq via ORAL
  Filled 2021-02-26 (×2): qty 2

## 2021-02-26 MED ORDER — CLONAZEPAM 0.5 MG PO TABS
0.5000 mg | ORAL_TABLET | Freq: Two times a day (BID) | ORAL | Status: DC
  Administered 2021-02-26 – 2021-02-27 (×2): 1 mg via ORAL
  Filled 2021-02-26 (×2): qty 2

## 2021-02-26 MED ORDER — ENOXAPARIN SODIUM 40 MG/0.4ML IJ SOSY: 40.0000 mg | PREFILLED_SYRINGE | INTRAMUSCULAR | Status: DC

## 2021-02-26 MED ORDER — HYDROXYZINE HCL 50 MG PO TABS
50.0000 mg | ORAL_TABLET | Freq: Three times a day (TID) | ORAL | Status: DC
Start: 2021-02-26 — End: 2021-02-27
  Administered 2021-02-26 – 2021-02-27 (×2): 50 mg via ORAL
  Filled 2021-02-26 (×2): qty 1

## 2021-02-26 MED ORDER — NICOTINE 21 MG/24HR TD PT24
21.0000 mg | MEDICATED_PATCH | TRANSDERMAL | Status: DC
  Administered 2021-02-26: 21 mg via TRANSDERMAL
  Filled 2021-02-26: qty 1

## 2021-02-26 MED ORDER — UMECLIDINIUM-VILANTEROL 62.5-25 MCG/INH IN AEPB
1.0000 | INHALATION_SPRAY | Freq: Every day | RESPIRATORY_TRACT | Status: DC
Start: 2021-02-27 — End: 2021-02-27
  Administered 2021-02-27: 1 via RESPIRATORY_TRACT
  Filled 2021-02-26: qty 14

## 2021-02-26 MED ORDER — ACETAMINOPHEN 650 MG RE SUPP: 650.0000 mg | Freq: Four times a day (QID) | RECTAL | Status: DC | PRN

## 2021-02-26 MED ORDER — ENSURE ENLIVE PO LIQD
237.0000 mL | Freq: Two times a day (BID) | ORAL | Status: DC
  Administered 2021-02-27: 237 mL via ORAL

## 2021-02-26 MED ORDER — ACETAMINOPHEN 325 MG PO TABS: 650.0000 mg | ORAL_TABLET | Freq: Four times a day (QID) | ORAL | Status: DC | PRN

## 2021-02-26 MED ORDER — ALBUTEROL SULFATE (2.5 MG/3ML) 0.083% IN NEBU
2.5000 mg | INHALATION_SOLUTION | RESPIRATORY_TRACT | Status: DC | PRN
Start: 2021-02-26 — End: 2021-02-27

## 2021-02-26 MED ORDER — DIVALPROEX SODIUM 250 MG PO DR TAB
500.0000 mg | DELAYED_RELEASE_TABLET | Freq: Two times a day (BID) | ORAL | Status: DC
  Administered 2021-02-26 – 2021-02-27 (×2): 500 mg via ORAL
  Filled 2021-02-26 (×2): qty 2

## 2021-02-26 MED ORDER — LACTATED RINGERS IV SOLN: INTRAVENOUS | Status: DC

## 2021-02-26 MED ORDER — HEPARIN SODIUM (PORCINE) 5000 UNIT/ML IJ SOLN
5000.0000 [IU] | Freq: Three times a day (TID) | INTRAMUSCULAR | Status: DC
  Filled 2021-02-26: qty 1

## 2021-02-26 MED ORDER — POTASSIUM CHLORIDE CRYS ER 20 MEQ PO TBCR
40.0000 meq | EXTENDED_RELEASE_TABLET | Freq: Once | ORAL | Status: AC
  Administered 2021-02-26: 40 meq via ORAL
  Filled 2021-02-26: qty 2

## 2021-02-26 MED ORDER — BUSPIRONE HCL 5 MG PO TABS
15.0000 mg | ORAL_TABLET | Freq: Three times a day (TID) | ORAL | Status: DC
  Administered 2021-02-26 – 2021-02-27 (×2): 15 mg via ORAL
  Filled 2021-02-26 (×2): qty 1

## 2021-02-26 MED ORDER — ONDANSETRON HCL 4 MG PO TABS: 4.0000 mg | ORAL_TABLET | Freq: Four times a day (QID) | ORAL | Status: DC | PRN

## 2021-02-26 MED ORDER — ZIPRASIDONE HCL 40 MG PO CAPS
40.0000 mg | ORAL_CAPSULE | Freq: Two times a day (BID) | ORAL | Status: DC
  Administered 2021-02-27: 40 mg via ORAL
  Filled 2021-02-26 (×2): qty 1

## 2021-02-26 MED ORDER — LACTATED RINGERS IV BOLUS
1000.0000 mL | Freq: Once | INTRAVENOUS | Status: AC
  Administered 2021-02-26: 1000 mL via INTRAVENOUS

## 2021-02-26 MED ORDER — POTASSIUM CHLORIDE 10 MEQ/100ML IV SOLN
10.0000 meq | INTRAVENOUS | Status: AC
  Administered 2021-02-26 (×3): 10 meq via INTRAVENOUS
  Filled 2021-02-26 (×3): qty 100

## 2021-02-26 MED ORDER — BENZTROPINE MESYLATE 0.5 MG PO TABS
1.0000 mg | ORAL_TABLET | Freq: Two times a day (BID) | ORAL | Status: DC
  Administered 2021-02-26 – 2021-02-27 (×2): 1 mg via ORAL
  Filled 2021-02-26 (×2): qty 2

## 2021-02-26 MED ORDER — ONDANSETRON HCL 4 MG/2ML IJ SOLN: 4.0000 mg | Freq: Four times a day (QID) | INTRAMUSCULAR | Status: DC | PRN

## 2021-02-26 MED ORDER — POTASSIUM CHLORIDE CRYS ER 20 MEQ PO TBCR: 40.0000 meq | EXTENDED_RELEASE_TABLET | Freq: Three times a day (TID) | ORAL | Status: DC

## 2021-02-26 NOTE — Plan of Care (Signed)

## 2021-02-26 NOTE — ED Notes (Signed)
Removed bag of medications from pt's room and placed in patient bag at nurse's station.

## 2021-02-26 NOTE — ED Provider Notes (Signed)
[facility]-EMERGENCY DEPT Provider Note   CSN: [id number] Arrival date & time: 02/26/21  1041     History No chief complaint on file.   Michaela Wells is a 60 y.o. female.  Patient is a 60 year old female with a history of COPD, GERD, bipolar disease, depression with psychotic features who is presenting today with her daughter due to 1 to 2 weeks of worsening hallucinations.  Daughter reports that for several years now she has had auditory hallucinations but in the last few weeks she is now having visual hallucinations.  She is seeing people and they are threatening to kill her.  Patient reports she has been very scared but is also having a lot of difficulty thinking.  Daughter reports she has had multiple falls.  This is all in the setting of having her medication changed 3 weeks ago.  She had multiple medications increased and started on duloxetine.  Daughter is not sure exactly what medications were increased.  Patient has not been using any marijuana or alcohol.  She has not had fever, new cough, shortness of breath.  She has had no nausea vomiting or diarrhea.  She has had decreased oral intake and daughter reports she does not drink enough fluids.  She continues to meet with her psychiatrist and counselor but daughter was concerned because of her behavior and brought her today for reevaluation.  The history is provided by the patient and a relative.      Past Medical History:  Diagnosis Date   Bronchial asthma    Chicken pox    COPD (chronic obstructive pulmonary disease) (HCC)    Depression    Frequent headaches    GERD (gastroesophageal reflux disease)    UTI (lower urinary tract infection)     Patient Active Problem List   Diagnosis Date Noted   Schizoaffective disorder, bipolar type (HCC) 06/14/2020   Psychosis (HCC) 10/23/2019   MDD (major depressive disorder) 10/19/2019   PTSD (post-traumatic stress disorder) 05/06/2018   Bipolar I disorder,  current or most recent episode depressed, with psychotic features (HCC) 05/05/2018   COPD (chronic obstructive pulmonary disease) (HCC) 10/09/2013   GAD (generalized anxiety disorder) 10/09/2013   Smoking trying to quit 10/09/2013   GERD (gastroesophageal reflux disease) 10/09/2013    Past Surgical History:  Procedure Laterality Date   ESOPHAGEAL DILATION     TONSILLECTOMY       OB History   No obstetric history on file.     Family History  Problem Relation Age of Onset   Other Mother        MVA-Deceased [pt was age 26]   COPD Father        Deceased   Heart attack Maternal Grandmother    Stroke Maternal Grandmother    Brain cancer Maternal Grandmother    Arthritis/Rheumatoid Maternal Grandmother    Heart attack Maternal Grandfather    Stroke Maternal Grandfather    Heart disease Maternal Aunt    Stroke Maternal Aunt        #1   Heart attack Maternal Aunt        #1   Hypertension Maternal Aunt        #1   Arthritis/Rheumatoid Sister    COPD Sister    Heart disease Maternal Uncle    Heart attack Maternal Uncle        x4   Heart disease Brother        #1   Heart attack Brother        #  1   Diabetes Brother    Diabetes Maternal Aunt    Mental retardation Maternal Aunt    Healthy Daughter        x2   Drug abuse Daughter        #1   Drug abuse Daughter        #2   Kidney Stones Daughter        #2   Other Son        Agoraphobia    Social History   Tobacco Use   Smoking status: Every Day    Pack years: 0.00   Smokeless tobacco: Never  Substance Use Topics   Alcohol use: Yes   Drug use: Not Currently    Home Medications Prior to Admission medications   Medication Sig Start Date End Date Taking? Authorizing Provider  albuterol (VENTOLIN HFA) 108 (90 Base) MCG/ACT inhaler Inhale 1 puff into the lungs every 4 (four) hours as needed for wheezing or shortness of breath. Ventolin 01/16/21   Zonia Kief, Amy J, NP  benztropine (COGENTIN) 1 MG tablet TAKE 1  TABLET(1 MG) BY MOUTH TWICE DAILY 02/05/21   Toy Cookey E, NP  busPIRone (BUSPAR) 15 MG tablet Take 1 tablet (15 mg total) by mouth 3 (three) times daily. 02/05/21   Shanna Cisco, NP  carvedilol (COREG) 25 MG tablet Take 1 tablet (25 mg total) by mouth 2 (two) times daily with a meal. 10/25/19   Malvin Johns, MD  clonazePAM (KLONOPIN) 0.5 MG tablet TAKE 1 TABLET(0.5 MG) BY MOUTH TWICE DAILY 02/05/21   Toy Cookey E, NP  divalproex (DEPAKOTE) 500 MG DR tablet Take 1 tablet (500 mg total) by mouth 2 (two) times daily. 02/05/21   Shanna Cisco, NP  DULoxetine (CYMBALTA) 20 MG capsule Take 1 capsule (20 mg total) by mouth daily. 02/05/21   Shanna Cisco, NP  fluticasone (FLONASE) 50 MCG/ACT nasal spray Place 2 sprays into both nostrils daily. Patient not taking: Reported on 01/12/2021 10/15/19   [provider]  hydrochlorothiazide (HYDRODIURIL) 12.5 MG tablet TAKE 1 TABLET(12.5 MG) BY MOUTH DAILY 02/15/21   [name], NP  hydrOXYzine (ATARAX/VISTARIL) 50 MG tablet Take 1 tablet (50 mg total) by mouth 3 (three) times daily. 02/05/21   Shanna Cisco, NP  losartan (COZAAR) 50 MG tablet Take 1 tablet (50 mg total) by mouth daily. 10/26/19   Malvin Johns, MD  nicotine (NICODERM CQ - DOSED IN MG/24 HOURS) 21 mg/24hr patch Place 1 patch (21 mg total) onto the skin daily. 02/05/21   Shanna Cisco, NP  omega-3 acid ethyl esters (LOVAZA) 1 g capsule Take 1 capsule (1 g total) by mouth 2 (two) times daily. 10/25/19   Malvin Johns, MD  pantoprazole (PROTONIX) 20 MG tablet Take 20 mg by mouth daily. 10/15/19   [provider]  umeclidinium-vilanterol (ANORO ELLIPTA) 62.5-25 MCG/INH AEPB Inhale 1 puff into the lungs daily. 01/16/21   [name], NP  ziprasidone (GEODON) 20 MG capsule Take 2 capsules (40 mg total) by mouth 2 (two) times daily with a meal. 02/05/21   Shanna Cisco, NP    Allergies    Penicillins  Review of Systems   Review of Systems   All other systems reviewed and are negative.  Physical Exam Updated Vital Signs BP (!) 133/101 (BP Location: Left Arm)   Pulse 92   Temp 99 F (37.2 C) (Oral)   Resp 19   Ht 5\' 2"  (1.575 m)  Wt 77 kg   SpO2 96%   BMI 31.05 kg/m   Physical Exam Vitals and nursing note reviewed.  Constitutional:      General: She is not in acute distress.    Appearance: She is well-developed.     Comments: Disheveled  HENT:     Head: Normocephalic and atraumatic.  Eyes:     Conjunctiva/sclera: Conjunctivae normal.     Pupils: Pupils are equal, round, and reactive to light.     Comments: 4 mm bilaterally and reactive  Cardiovascular:     Rate and Rhythm: Normal rate and regular rhythm.     Heart sounds: No murmur heard. Pulmonary:     Effort: Pulmonary effort is normal. No respiratory distress.     Breath sounds: Normal breath sounds. No wheezing or rales.  Abdominal:     General: There is no distension.     Palpations: Abdomen is soft.     Tenderness: There is no abdominal tenderness. There is no guarding or rebound.  Musculoskeletal:        General: No tenderness. Normal range of motion.     Cervical back: Normal range of motion and neck supple.     Right lower leg: No edema.     Left lower leg: No edema.  Skin:    General: Skin is warm and dry.     Findings: No erythema or rash.  Neurological:     Mental Status: She is alert and oriented to person, place, and time.     Sensory: No sensory deficit.     Motor: No weakness.     Comments: Normal finger-to-nose.  Resting tremor noted in bilateral hands.  Slightly unsteady gait but no frank ataxia.  Psychiatric:        Speech: Speech is delayed and tangential.        Behavior: Behavior normal.     Comments: Patient is intermittently tearful.  Auditory and visual hallucinations.  Patient has a very hard time giving any significant history because she continues to forget what she saying and starts back over again.  She does become  frustrated by this.    ED Results / Procedures / Treatments   Labs (all labs ordered are listed, but only abnormal results are displayed) Labs Reviewed  CBC WITH DIFFERENTIAL/PLATELET - Abnormal; Notable for the following components:      Result Value   Monocytes Absolute 1.2 (*)    All other components within normal limits  COMPREHENSIVE METABOLIC PANEL - Abnormal; Notable for the following components:   Potassium 2.5 (*)    Chloride 97 (*)    Glucose, Bld 106 (*)    BUN 24 (*)    Creatinine, Ser 1.81 (*)    Calcium 8.7 (*)    GFR, Estimated 32 (*)    All other components within normal limits  ACETAMINOPHEN LEVEL - Abnormal; Notable for the following components:   Acetaminophen (Tylenol), Serum <10 (*)    All other components within normal limits  SALICYLATE LEVEL - Abnormal; Notable for the following components:   Salicylate Lvl <7.0 (*)    All other components within normal limits  ETHANOL  VALPROIC ACID LEVEL  RAPID URINE DRUG SCREEN, HOSP PERFORMED  MAGNESIUM    EKG EKG Interpretation  Date/Time:  Monday February 26 2021 12:20:53 EDT Ventricular Rate:  78 PR Interval:  134 QRS Duration: 90 QT Interval:  404 QTC Calculation: 461 R Axis:   49 Text Interpretation: Sinus rhythm Low voltage, precordial  leads Abnormal R-wave progression, early transition No significant change since last tracing Confirmed by Gwyneth Sprout ([id number]) on 02/26/2021 12:44:29 PM  Radiology CT Head Wo Contrast  Result Date: 02/26/2021 CLINICAL DATA:  Mental status change.  Psychosis. EXAM: CT HEAD WITHOUT CONTRAST TECHNIQUE: Contiguous axial images were obtained from the base of the skull through the vertex without intravenous contrast. COMPARISON:  MR head 10/21/2019 FINDINGS: Brain: No evidence of acute infarction, hemorrhage, hydrocephalus, extra-axial collection or mass lesion/mass effect. Vascular: No hyperdense vessel or unexpected calcification. Skull: Normal. Negative for fracture or focal  lesion. Sinuses/Orbits: Paranasal sinuses are clear. Left mastoid air cell effusion Other: None IMPRESSION: 1. No acute intracranial abnormalities. 2. Left mastoid air cell effusion. Electronically Signed   By: Signa Kell M.D.   On: 02/26/2021 12:00   DG Chest Port 1 View  Result Date: 02/26/2021 CLINICAL DATA:  Altered mental status. Coughing and shortness of breath for several days. EXAM: PORTABLE CHEST 1 VIEW COMPARISON:  01/04/2018. FINDINGS: Cardiac silhouette is normal in size. Normal mediastinal and hilar contours. Clear lungs.  No convincing pleural effusion or pneumothorax. Skeletal structures are grossly intact. IMPRESSION: No active disease. Electronically Signed   By: Amie Portland M.D.   On: 02/26/2021 12:21    Procedures Procedures   Medications Ordered in ED Medications - No data to display  ED Course  I have reviewed the triage vital signs and the nursing notes.  Pertinent labs & imaging results that were available during my care of the patient were reviewed by me and considered in my medical decision making (see chart for details).    MDM Rules/Calculators/A&P                          Patient with psychiatric history and COPD presenting today for worsening hallucinations and now with visual hallucinations as well that are going to kill her.  Also new difficulty with gait and memory.  This has been worsening over the last 2 weeks all in the setting of having her medications increased and new medication started approximately 3 weeks ago.  Patient does take BC powders as well that does have a significant amount of aspirin.  She is on Depakote but daughter denies any alcohol or marijuana use recently.  Patient on exam does have tangential thinking and unable to complete a full sentence before she starts over for forgetting what she was saying.  She has mild resting tremor but no frank ataxia.  Low suspicion for stroke at this time feel symptoms are most likely related to  medications however patient has had several falls due to balance issues since he has medicine changes.  We will do a head CT, lab work including acetaminophen and salicylate levels.  EKG also pending.  1:24 PM Labs show new AKI, hypokalemia but o/w normal.  Head CT neg.  EKG without acute findings.  Will admit for AKI and hypokalemia.  Started on potassium infusion and mag level pending.  MDM   Amount and/or Complexity of Data Reviewed Clinical lab tests: ordered and reviewed Tests in the radiology section of CPT: ordered and reviewed Tests in the medicine section of CPT: ordered and reviewed  Risk of Complications, Morbidity, and/or Mortality Presenting problems: high Diagnostic procedures: high Management options: high   CRITICAL CARE Performed by: Rhylee Nunn Total critical care time: 30 minutes Critical care time was exclusive of separately billable procedures and treating other patients. Critical care was necessary to treat  or prevent imminent or life-threatening deterioration. Critical care was time spent personally by me on the following activities: development of treatment plan with patient and/or surrogate as well as nursing, discussions with consultants, evaluation of patient's response to treatment, examination of patient, obtaining history from patient or surrogate, ordering and performing treatments and interventions, ordering and review of laboratory studies, ordering and review of radiographic studies, pulse oximetry and re-evaluation of patient's condition.   Final Clinical Impression(s) / ED Diagnoses Final diagnoses:  Hypokalemia  AKI (acute kidney injury) [facility](HCC)    Rx / DC Orders ED Discharge Orders     None        Gwyneth SproutPlunkett, Calven Gilkes, MD 02/26/21 1326

## 2021-02-26 NOTE — H&P (Signed)
History and Physical    Michaela PaneRhonda L Goeser WGN:[id number] DOB: 10-09-1960 DOA: 02/26/2021  PCP: No PCP Patient coming from: home  Chief Complaint: Weakness  HPI: Michaela Wells is a 60 y.o. female with medical history significant of COPD, bipolar disease, depression with psychotic features, auditory and visual hallucinations, and reflux.  In mid June, the patient stopped Seroquel and duloxetine was added.  Since that time, the patient has been very sleepy.  She has been eating and drinking much less.  She feels that the hallucinations have been worsening.  Both she and her daughter are frustrated with the mental health issues she is experiencing.  She reports that she has not taken any of her medicines last 2 days.  She has a history of alcohol and marijuana use but has not consumed any in 2 weeks.  ED Course: Patient is lab work-up was largely unremarkable with the exception of acute kidney injury with a creatinine of 1.81, baseline of around 0.9.  Her potassium was also quite low at 2.5.  CT head is unremarkable.  UDS pending at time of this note.  Review of Systems: As per HPI otherwise 10 point review of systems negative.   Past Medical History:  Diagnosis Date   Bronchial asthma    Chicken pox    COPD (chronic obstructive pulmonary disease) (HCC)    Depression    Frequent headaches    GERD (gastroesophageal reflux disease)    UTI (lower urinary tract infection)     Past Surgical History:  Procedure Laterality Date   ESOPHAGEAL DILATION     TONSILLECTOMY       reports that she has been smoking. She has never used smokeless tobacco. She reports current alcohol use. She reports previous drug use.  Allergies  Allergen Reactions   Penicillins Other (See Comments)    Childhood allergy- Reaction?? Has patient had a PCN reaction causing immediate rash, facial/tongue/throat swelling, SOB or lightheadedness with hypotension: Unknown Has patient had a PCN reaction causing severe rash  involving mucus membranes or skin necrosis: Unknown Has patient had a PCN reaction that required hospitalization: Unknown Has patient had a PCN reaction occurring within the last 10 years: Unknown If all of the above answers are "NO", then may proceed with Cephalosporin use.      Family History  Problem Relation Age of Onset   Other Mother        MVA-Deceased [pt was age 4]   COPD Father        Deceased   Heart attack Maternal Grandmother    Stroke Maternal Grandmother    Brain cancer Maternal Grandmother    Arthritis/Rheumatoid Maternal Grandmother    Heart attack Maternal Grandfather    Stroke Maternal Grandfather    Heart disease Maternal Aunt    Stroke Maternal Aunt        #1   Heart attack Maternal Aunt        #1   Hypertension Maternal Aunt        #1   Arthritis/Rheumatoid Sister    COPD Sister    Heart disease Maternal Uncle    Heart attack Maternal Uncle        x4   Heart disease Brother        #1   Heart attack Brother        #1   Diabetes Brother    Diabetes Maternal Aunt    Mental retardation Maternal Aunt    Healthy Daughter  x2   Drug abuse Daughter        #1   Drug abuse Daughter        #2   Kidney Stones Daughter        #2   Other Son        Agoraphobia    Prior to Admission medications   Medication Sig Start Date End Date Taking? Authorizing Provider  albuterol (VENTOLIN HFA) 108 (90 Base) MCG/ACT inhaler Inhale 1 puff into the lungs every 4 (four) hours as needed for wheezing or shortness of breath. Ventolin Patient taking differently: No sig reported 01/16/21  Yes Zonia Kief, Amy J, NP  Aspirin-Salicylamide-Caffeine (BC HEADACHE POWDER PO) Take 1 packet by mouth 4 (four) times daily as needed (for pain or headaches).   Yes [provider]  benztropine (COGENTIN) 1 MG tablet TAKE 1 TABLET(1 MG) BY MOUTH TWICE DAILY Patient taking differently: Take 1 mg by mouth 2 (two) times daily. 02/05/21  Yes Toy Cookey E, NP  busPIRone  (BUSPAR) 15 MG tablet Take 1 tablet (15 mg total) by mouth 3 (three) times daily. 02/05/21  Yes Toy Cookey E, NP  clonazePAM (KLONOPIN) 0.5 MG tablet TAKE 1 TABLET(0.5 MG) BY MOUTH TWICE DAILY Patient taking differently: Take 0.5-1 mg by mouth 2 (two) times daily. 02/05/21  Yes Toy Cookey E, NP  divalproex (DEPAKOTE) 500 MG DR tablet Take 1 tablet (500 mg total) by mouth 2 (two) times daily. 02/05/21  Yes Toy Cookey E, NP  DULoxetine (CYMBALTA) 20 MG capsule Take 1 capsule (20 mg total) by mouth daily. 02/05/21  Yes Toy Cookey E, NP  hydrochlorothiazide (HYDRODIURIL) 12.5 MG tablet TAKE 1 TABLET(12.5 MG) BY MOUTH DAILY Patient taking differently: Take 12.5 mg by mouth daily. 02/15/21  Yes Zonia Kief, Amy J, NP  hydrOXYzine (ATARAX/VISTARIL) 50 MG tablet Take 1 tablet (50 mg total) by mouth 3 (three) times daily. 02/05/21  Yes Toy Cookey E, NP  nicotine (NICODERM CQ - DOSED IN MG/24 HOURS) 21 mg/24hr patch Place 1 patch (21 mg total) onto the skin daily. 02/05/21  Yes Toy Cookey E, NP  QUEtiapine (SEROQUEL) 400 MG tablet Take 200 mg by mouth at bedtime. Take 200 mg by mouth at bedtime and an additional 200 mg later in the night, if no relief from insomnia   Yes [provider]  umeclidinium-vilanterol (ANORO ELLIPTA) 62.5-25 MCG/INH AEPB Inhale 1 puff into the lungs daily. 01/16/21  Yes Zonia Kief, Amy J, NP  ziprasidone (GEODON) 20 MG capsule Take 2 capsules (40 mg total) by mouth 2 (two) times daily with a meal. 02/05/21  Yes Toy Cookey E, NP  carvedilol (COREG) 25 MG tablet Take 1 tablet (25 mg total) by mouth 2 (two) times daily with a meal. Patient not taking: Reported on 02/26/2021 10/25/19   Malvin Johns, MD  fluticasone [facility]) 50 MCG/ACT nasal spray Place 2 sprays into both nostrils daily. 10/15/19   [provider]  losartan (COZAAR) 50 MG tablet Take 1 tablet (50 mg total) by mouth daily. Patient not taking: Reported on 02/26/2021 10/26/19    Malvin Johns, MD  omega-3 acid ethyl esters (LOVAZA) 1 g capsule Take 1 capsule (1 g total) by mouth 2 (two) times daily. Patient not taking: Reported on 02/26/2021 10/25/19   Malvin Johns, MD  pantoprazole (PROTONIX) 20 MG tablet Take 20 mg by mouth daily. Patient not taking: Reported on 02/26/2021 10/15/19   [provider]    Physical Exam: Vitals:   02/26/21 1052 02/26/21 1230  02/26/21 1532 02/26/21 1533  BP:  124/80 113/88   Pulse:  73 77   Resp:  19 18   Temp:   98.3 F (36.8 C)   TempSrc:   Oral   SpO2:  96% 100%   Weight: 77 kg   76.4 kg  Height: 5\' 2"  (1.575 m)   5\' 2"  (1.575 m)    Constitutional: NAD, calm, comfortable Eyes: PERRL, lids and conjunctivae normal ENMT: Mucous membranes are dry.  Posterior pharynx clear of any exudate or lesions. Neck: normal, supple, no masses, no thyromegaly Respiratory: clear to auscultation bilaterally, no wheezing, no crackles. Normal respiratory effort. No accessory muscle use.  Cardiovascular: RRR. No extremity edema. 2+ pedal pulses. No carotid bruits.  Abdomen: no tenderness, no masses palpated. No hepatosplenomegaly. Bowel sounds positive.  Musculoskeletal: no clubbing / cyanosis. No joint deformity upper and lower extremities. Good ROM, no contractures. Normal muscle tone.  Skin: no rashes, lesions, ulcers.  Neurologic: CN 2-12 grossly intact. Sensation intact, no cerebellar signs Psychiatric: Limited judgment and insight.  Poor focus, normal mood.  Labs on Admission: I have personally reviewed following labs and imaging studies  CBC: Recent Labs  Lab 02/26/21 1208 02/26/21 1457  WBC 7.5 7.5  NEUTROABS 3.8  --   HGB 14.0 12.2  HCT 41.9 37.3  MCV 88.4 90.5  PLT 198 200   Basic Metabolic Panel: Recent Labs  Lab 02/26/21 1208 02/26/21 1457  NA 140  --   K 2.5*  --   CL 97*  --   CO2 29  --   GLUCOSE 106*  --   BUN 24*  --   CREATININE 1.81* 1.57*  CALCIUM 8.7*  --    GFR: Estimated Creatinine Clearance:  36.5 mL/min (A) (by C-G formula based on SCr of 1.57 mg/dL (H)).  Liver Function Tests: Recent Labs  Lab 02/26/21 1208  AST 41  ALT 24  ALKPHOS 67  BILITOT 1.0  PROT 7.7  ALBUMIN 4.3   Radiological Exams on Admission: CT Head Wo Contrast  Result Date: 02/26/2021 CLINICAL DATA:  Mental status change.  Psychosis. EXAM: CT HEAD WITHOUT CONTRAST TECHNIQUE: Contiguous axial images were obtained from the base of the skull through the vertex without intravenous contrast. COMPARISON:  MR head 10/21/2019 FINDINGS: Brain: No evidence of acute infarction, hemorrhage, hydrocephalus, extra-axial collection or mass lesion/mass effect. Vascular: No hyperdense vessel or unexpected calcification. Skull: Normal. Negative for fracture or focal lesion. Sinuses/Orbits: Paranasal sinuses are clear. Left mastoid air cell effusion Other: None IMPRESSION: 1. No acute intracranial abnormalities. 2. Left mastoid air cell effusion. Electronically Signed   By: 04/29/2021 M.D.   On: 02/26/2021 12:00   DG Chest Port 1 View  Result Date: 02/26/2021 CLINICAL DATA:  Altered mental status. Coughing and shortness of breath for several days. EXAM: PORTABLE CHEST 1 VIEW COMPARISON:  01/04/2018. FINDINGS: Cardiac silhouette is normal in size. Normal mediastinal and hilar contours. Clear lungs.  No convincing pleural effusion or pneumothorax. Skeletal structures are grossly intact. IMPRESSION: No active disease. Electronically Signed   By: 04/29/2021 M.D.   On: 02/26/2021 12:21    EKG: Independently reviewed.   Assessment/Plan Principal Problem:   Acute kidney failure (HCC)  LR 100 mL/hour  Monitor BMP  Encourage oral intake  Hold nephrotoxins  Active Problems:   Hypokalemia  Monitor BMP  40 mEq of Klor-Con tonight and 2 times tomorrow    COPD (chronic obstructive pulmonary disease) (HCC)  Continue Incruse daily and albuterol  as needed     GERD (gastroesophageal reflux disease)  Currently not taking any  medication    Bipolar I disorder, current or most recent episode depressed, with psychotic features (HCC)  Depakote 500 mg twice daily  Hold Cymbalta  Cogentin 1 mg twice daily  BuSpar 15mg  3 times daily  Klonopin 0.5 mg twice daily  Hydroxyzine 50 mg 3 times daily  Do not 40 mg twice daily    Psychosis (HCC)  As above     DVT prophylaxis: HPN Code Status: Full  Family Communication: Self, daughter at bedside and son on phone Disposition Plan: Home Consults called: None  Admission status: Obs   Severity of Illness: The appropriate patient status for this patient is OBSERVATION. Observation status is judged to be reasonable and necessary in order to provide the required intensity of service to ensure the patient's safety. The patient's presenting symptoms, physical exam findings, and initial radiographic and laboratory data in the context of their medical condition is felt to place them at decreased risk for further clinical deterioration. Furthermore, it is anticipated that the patient will be medically stable for discharge from the hospital within 2 midnights of admission. The following factors support the patient status of observation.   " The patient's presenting symptoms include weakness. " The physical exam findings include dry MM. " The initial radiographic and laboratory data are concerning for AKI and low potassium.  , DO [facility] [email or web address] 02/26/2021, 5:31 PM

## 2021-02-26 NOTE — ED Triage Notes (Signed)
BIB daughter, pt has been hearing voices and seeing things since last night, thinks people were trying to kill her. She started duloxetine on 6/13, has recently upped a lot of her other psych meds as well. Denies SI/HI, states the people she are seeing are trying to get her, pt's speech is unclear, daughter gives most history.

## 2021-02-27 DIAGNOSIS — N179 Acute kidney failure, unspecified: Secondary | ICD-10-CM | POA: Diagnosis not present

## 2021-02-27 LAB — CBC
HCT: 38.6 % (ref 36.0–46.0)
Hemoglobin: 12.7 g/dL (ref 12.0–15.0)
MCH: 29.7 pg (ref 26.0–34.0)
MCHC: 32.9 g/dL (ref 30.0–36.0)
MCV: 90.2 fL (ref 80.0–100.0)
Platelets: 79 10*3/uL — ABNORMAL LOW (ref 150–400)
RBC: 4.28 MIL/uL (ref 3.87–5.11)
RDW: 13.1 % (ref 11.5–15.5)
WBC: 4.6 10*3/uL (ref 4.0–10.5)
nRBC: 0 % (ref 0.0–0.2)

## 2021-02-27 LAB — BASIC METABOLIC PANEL
Anion gap: 9 (ref 5–15)
BUN: 15 mg/dL (ref 6–20)
CO2: 30 mmol/L (ref 22–32)
Calcium: 8 mg/dL — ABNORMAL LOW (ref 8.9–10.3)
Chloride: 102 mmol/L (ref 98–111)
Creatinine, Ser: 1.14 mg/dL — ABNORMAL HIGH (ref 0.44–1.00)
GFR, Estimated: 55 mL/min — ABNORMAL LOW (ref >60)
Glucose, Bld: 76 mg/dL (ref 70–99)
Potassium: 3.5 mmol/L (ref 3.5–5.1)
Sodium: 141 mmol/L (ref 135–145)

## 2021-02-27 LAB — SARS CORONAVIRUS 2 (TAT 6-24 HRS): SARS Coronavirus 2: NEGATIVE

## 2021-02-27 LAB — TSH: TSH: 3.501 u[IU]/mL (ref 0.350–4.500)

## 2021-02-27 LAB — VITAMIN B12: Vitamin B-12: 532 pg/mL (ref 180–914)

## 2021-02-27 NOTE — Discharge Summary (Signed)
Physician Discharge Summary  Michaela PaneRhonda L Wells WNU:[id number] DOB: 1961/05/20 DOA: 02/26/2021  PCP: Michaela FendtStephens, Amy J, NP  Admit date: 02/26/2021  Discharge date: 02/27/2021  Admitted From: Home.  Disposition:  Home.  Recommendations for Outpatient Follow-up:  Follow up with PCP in 1-2 weeks. Please obtain BMP/CBC in one week. Advised to continue to hold Cymbalta. Advised to continue all other psych medications.  Home Health:None Equipment/Devices:None  Discharge Condition: Good CODE STATUS:Full code Diet recommendation: Heart Healthy   [facility]: This 60 y.o. female with medical history significant of COPD, bipolar disease, depression with psychotic features, auditory and visual hallucinations, and reflux presented in the emergency department with poor p.o. intake.  Patient was started on Cymbalta and Seroquel was discontinued last month, since that time patient has been feeling very sleepy and has poor p.o. intake.  She also noticed hallucinations are getting worse. Daughter reports she has not taken her medications in the last 2 days.  Patient also drinks alcohol and uses marijuana , she denies recent use in 2 weeks. She was found to have acute kidney injury with serum creatinine of 1.81 with baseline normal serum creatinine. She is also found to have potassium of 2.5.  CT head was unremarkable,  urine drug screen negative.  Patient was admitted for acute kidney injury probably secondary to decreased p.o. intake. She was given IV hydration and serum creatinine has significantly improved from 1.81> 1.14 , Potassium has normalized. Patient feels better and wants to be discharged.  Patient is being discharged home. Advised to follow-up with primary care physician in 1 week.  Advised to discontinue Cymbalta.  Discharge Diagnoses:  Principal Problem:   Acute kidney failure (HCC) Active Problems:   COPD (chronic obstructive pulmonary disease) (HCC)   GERD (gastroesophageal  reflux disease)   Bipolar I disorder, current or most recent episode depressed, with psychotic features (HCC)   Psychosis (HCC)   Hypokalemia  Discharge Instructions  Discharge Instructions     Call MD for:  difficulty breathing, headache or visual disturbances   Complete by: As directed    Call MD for:  persistant dizziness or light-headedness   Complete by: As directed    Call MD for:  persistant nausea and vomiting   Complete by: As directed    Diet - low sodium heart healthy   Complete by: As directed    Diet Carb Modified   Complete by: As directed    Discharge instructions   Complete by: As directed    Advised to follow-up with primary care physician in 1 week. Advised to continue to hold Cymbalta. Advised to continue all other psych medications.   Increase activity slowly   Complete by: As directed       Allergies as of 02/27/2021       Reactions   Penicillins Other (See Comments)   Childhood allergy- Reaction?? Has patient had a PCN reaction causing immediate rash, facial/tongue/throat swelling, SOB or lightheadedness with hypotension: Unknown Has patient had a PCN reaction causing severe rash involving mucus membranes or skin necrosis: Unknown Has patient had a PCN reaction that required hospitalization: Unknown Has patient had a PCN reaction occurring within the last 10 years: Unknown If all of the above answers are "NO", then may proceed with Cephalosporin use.          Medication List     STOP taking these medications    DULoxetine 20 MG capsule Commonly known as: Cymbalta       TAKE  these medications    albuterol 108 (90 Base) MCG/ACT inhaler Commonly known as: VENTOLIN HFA Inhale 1 puff into the lungs every 4 (four) hours as needed for wheezing or shortness of breath. Ventolin What changed: additional instructions   Anoro Ellipta 62.5-25 MCG/INH Aepb Generic drug: umeclidinium-vilanterol Inhale 1 puff into the lungs daily.   benztropine 1  MG tablet Commonly known as: COGENTIN TAKE 1 TABLET(1 MG) BY MOUTH TWICE DAILY What changed:  how much to take how to take this when to take this additional instructions   busPIRone 15 MG tablet Commonly known as: BUSPAR Take 1 tablet (15 mg total) by mouth 3 (three) times daily.   clonazePAM 0.5 MG tablet Commonly known as: KLONOPIN TAKE 1 TABLET(0.5 MG) BY MOUTH TWICE DAILY What changed:  how much to take how to take this when to take this additional instructions   divalproex 500 MG DR tablet Commonly known as: DEPAKOTE Take 1 tablet (500 mg total) by mouth 2 (two) times daily.   fluticasone 50 MCG/ACT nasal spray Commonly known as: FLONASE Place 2 sprays into both nostrils daily.   hydrochlorothiazide 12.5 MG tablet Commonly known as: HYDRODIURIL TAKE 1 TABLET(12.5 MG) BY MOUTH DAILY What changed: See the new instructions.   hydrOXYzine 50 MG tablet Commonly known as: ATARAX/VISTARIL Take 1 tablet (50 mg total) by mouth 3 (three) times daily.   nicotine 21 mg/24hr patch Commonly known as: NICODERM CQ - dosed in mg/24 hours Place 1 patch (21 mg total) onto the skin daily.   ziprasidone 20 MG capsule Commonly known as: Geodon Take 2 capsules (40 mg total) by mouth 2 (two) times daily with a meal.        Follow-up Information     Michaela Fendt, NP Follow up in 1 week(s).   Specialty: Nurse Practitioner Contact information: [address] [place] [address] [phone]         [facility] Follow up in 1 week(s).   Contact information: [address], [address] [place] [address] [phone]                Allergies  Allergen Reactions   Penicillins Other (See Comments)    Childhood allergy- Reaction?? Has patient had a PCN reaction causing immediate rash, facial/tongue/throat swelling, SOB or lightheadedness with hypotension: Unknown Has patient had a PCN reaction causing severe rash  involving mucus membranes or skin necrosis: Unknown Has patient had a PCN reaction that required hospitalization: Unknown Has patient had a PCN reaction occurring within the last 10 years: Unknown If all of the above answers are "NO", then may proceed with Cephalosporin use.      Consultations: None   Procedures/Studies: CT Head Wo Contrast  Result Date: 02/26/2021 CLINICAL DATA:  Mental status change.  Psychosis. EXAM: CT HEAD WITHOUT CONTRAST TECHNIQUE: Contiguous axial images were obtained from the base of the skull through the vertex without intravenous contrast. COMPARISON:  MR head 10/21/2019 FINDINGS: Brain: No evidence of acute infarction, hemorrhage, hydrocephalus, extra-axial collection or mass lesion/mass effect. Vascular: No hyperdense vessel or unexpected calcification. Skull: Normal. Negative for fracture or focal lesion. Sinuses/Orbits: Paranasal sinuses are clear. Left mastoid air cell effusion Other: None IMPRESSION: 1. No acute intracranial abnormalities. 2. Left mastoid air cell effusion. Electronically Signed   By: Signa Kell M.D.   On: 02/26/2021 12:00   DG Chest Port 1 View  Result Date: 02/26/2021 CLINICAL DATA:  Altered mental status. Coughing and shortness  of breath for several days. EXAM: PORTABLE CHEST 1 VIEW COMPARISON:  01/04/2018. FINDINGS: Cardiac silhouette is normal in size. Normal mediastinal and hilar contours. Clear lungs.  No convincing pleural effusion or pneumothorax. Skeletal structures are grossly intact. IMPRESSION: No active disease. Electronically Signed   By: Amie Portland M.D.   On: 02/26/2021 12:21      Subjective: Patient reports feeling better,  Patient was seen and examined at bedside,  overnight events noted.  Patient wants to be discharged.  Renal functions improved.  Discharge Exam: Vitals:   02/27/21 0425 02/27/21 0749  BP: 108/68   Pulse: 65 77  Resp: 20 18  Temp: 98.3 F (36.8 C)   SpO2: 95% 93%   Vitals:   02/26/21 2009  02/26/21 2351 02/27/21 0425 02/27/21 0749  BP: 115/77 121/74 108/68   Pulse: 65 68 65 77  Resp:   20 18  Temp: 98.2 F (36.8 C) 98.3 F (36.8 C) 98.3 F (36.8 C)   TempSrc: Oral Oral Oral   SpO2:  93% 95% 93%  Weight:      Height:        General: Pt is alert, awake, not in acute distress Cardiovascular: RRR, S1/S2 +, no rubs, no gallops Respiratory: CTA bilaterally, no wheezing, no rhonchi Abdominal: Soft, NT, ND, bowel sounds + Extremities: no edema, no cyanosis    The results of significant diagnostics from this hospitalization (including imaging, microbiology, ancillary and laboratory) are listed below for reference.     Microbiology: No results found for this or any previous visit (from the past 240 hour(s)).   Labs: BNP (last 3 results) No results for input(s): BNP in the last 8760 hours. Basic Metabolic Panel: Recent Labs  Lab 02/26/21 1208 02/26/21 1457 02/27/21 0324  NA 140  --  141  K 2.5*  --  3.5  CL 97*  --  102  CO2 29  --  30  GLUCOSE 106*  --  76  BUN 24*  --  15  CREATININE 1.81* 1.57* 1.14*  CALCIUM 8.7*  --  8.0*  MG  --  2.7*  --    Liver Function Tests: Recent Labs  Lab 02/26/21 1208  AST 41  ALT 24  ALKPHOS 67  BILITOT 1.0  PROT 7.7  ALBUMIN 4.3   No results for input(s): LIPASE, AMYLASE in the last 168 hours. No results for input(s): AMMONIA in the last 168 hours. CBC: Recent Labs  Lab 02/26/21 1208 02/26/21 1457 02/27/21 0324  WBC 7.5 7.5 4.6  NEUTROABS 3.8  --   --   HGB 14.0 12.2 12.7  HCT 41.9 37.3 38.6  MCV 88.4 90.5 90.2  PLT 198 200 79*   Cardiac Enzymes: No results for input(s): CKTOTAL, CKMB, CKMBINDEX, TROPONINI in the last 168 hours. BNP: Invalid input(s): POCBNP CBG: No results for input(s): GLUCAP in the last 168 hours. D-Dimer No results for input(s): DDIMER in the last 72 hours. Hgb A1c No results for input(s): HGBA1C in the last 72 hours. Lipid Profile No results for input(s): CHOL, HDL, LDLCALC,  TRIG, CHOLHDL, LDLDIRECT in the last 72 hours. Thyroid function studies Recent Labs    02/27/21 0324  TSH 3.501   Anemia work up Recent Labs    02/27/21 0324  VITAMINB12 532   Urinalysis    Component Value Date/Time   COLORURINE STRAW (A) 10/23/2019 1451   APPEARANCEUR CLEAR 10/23/2019 1451   LABSPEC 1.005 10/23/2019 1451   PHURINE 6.0 10/23/2019 1451  GLUCOSEU NEGATIVE 10/23/2019 1451   HGBUR NEGATIVE 10/23/2019 1451   BILIRUBINUR NEGATIVE 10/23/2019 1451   KETONESUR NEGATIVE 10/23/2019 1451   PROTEINUR NEGATIVE 10/23/2019 1451   NITRITE NEGATIVE 10/23/2019 1451   LEUKOCYTESUR NEGATIVE 10/23/2019 1451   Sepsis Labs Invalid input(s): PROCALCITONIN,  WBC,  LACTICIDVEN Microbiology No results found for this or any previous visit (from the past 240 hour(s)).   Time coordinating discharge: Over 30 minutes  SIGNED:   Cipriano Bunker, MD  [facility] 02/27/2021, 12:19 PM Pager   If 7PM-7AM, please contact night-coverage [email or web address]

## 2021-02-27 NOTE — Plan of Care (Signed)
Pt discharge instructions to be reviewed with pt and daughter  Clair Gulling at bedside. Pt to be discharged home. Pt daughter Clair Gulling to provide transportation. Problem: Education: Goal: Knowledge of General Education information will improve Description: Including pain rating scale, medication(s)/side effects and non-pharmacologic comfort measures Outcome: Adequate for Discharge   Problem: Health Behavior/Discharge Planning: Goal: Ability to manage health-related needs will improve Outcome: Adequate for Discharge   Problem: Clinical Measurements: Goal: Ability to maintain clinical measurements within normal limits will improve Outcome: Adequate for Discharge Goal: Will remain free from infection Outcome: Adequate for Discharge Goal: Diagnostic test results will improve Outcome: Adequate for Discharge Goal: Respiratory complications will improve Outcome: Adequate for Discharge Goal: Cardiovascular complication will be avoided Outcome: Adequate for Discharge   Problem: Activity: Goal: Risk for activity intolerance will decrease Outcome: Adequate for Discharge   Problem: Nutrition: Goal: Adequate nutrition will be maintained Outcome: Adequate for Discharge   Problem: Coping: Goal: Level of anxiety will decrease Outcome: Adequate for Discharge   Problem: Elimination: Goal: Will not experience complications related to bowel motility Outcome: Adequate for Discharge Goal: Will not experience complications related to urinary retention Outcome: Adequate for Discharge   Problem: Pain Managment: Goal: General experience of comfort will improve Outcome: Adequate for Discharge   Problem: Safety: Goal: Ability to remain free from injury will improve Outcome: Adequate for Discharge   Problem: Skin Integrity: Goal: Risk for impaired skin integrity will decrease Outcome: Adequate for Discharge

## 2021-02-28 ENCOUNTER — Telehealth: Payer: Self-pay

## 2021-02-28 NOTE — Telephone Encounter (Signed)
Transition Care Management Unsuccessful Follow-up Telephone Call  Date of discharge and from where:  [facility], [place] on  02/27/2021  Attempts:  1st Attempt  Reason for unsuccessful TCM follow-up call:  Unable to leave message at [phone].  Pt has a scheduled appt with PCP on March 27, 2021.

## 2021-03-01 ENCOUNTER — Telehealth: Payer: Self-pay

## 2021-03-01 NOTE — Telephone Encounter (Signed)
Transition Care Management Unsuccessful Follow-up Telephone Call  Date of discharge and from where:  02/27/2021, [facility]  Attempts:  2nd Attempt  Reason for unsuccessful TCM follow-up call:  Left voice message on # [phone]. Call back requested to this CM.  Call also placed to # [phone] and the person who answered stated that is was the wrong number.  This number was then removed from Epic  Patient has appointment with Ricky Stabs, NP 03/27/2021 @ [facility].

## 2021-03-02 ENCOUNTER — Telehealth: Payer: Self-pay

## 2021-03-02 NOTE — Telephone Encounter (Signed)
Transition Care Management Unsuccessful Follow-up Telephone Call   Date of discharge and from where:  02/27/2021, [facility]   Attempts:  3rd Attempt   Reason for unsuccessful TCM follow-up call:  Left voice message on # [phone]. Call back requested to this CM.    Patient has appointment with Ricky Stabs, NP 03/27/2021 @ [facility].

## 2021-03-04 ENCOUNTER — Emergency Department (HOSPITAL_COMMUNITY): Payer: Medicaid Other

## 2021-03-04 ENCOUNTER — Encounter (HOSPITAL_COMMUNITY): Payer: Self-pay | Admitting: Internal Medicine

## 2021-03-04 ENCOUNTER — Inpatient Hospital Stay (HOSPITAL_COMMUNITY)
Admission: EM | Admit: 2021-03-04 | Discharge: 2021-03-13 | DRG: 640 | Disposition: A | Discharge status: Other Acute Inpatient Hospital | Payer: Medicaid Other | Attending: Internal Medicine | Admitting: Internal Medicine

## 2021-03-04 DIAGNOSIS — R441 Visual hallucinations: Secondary | ICD-10-CM | POA: Diagnosis not present

## 2021-03-04 DIAGNOSIS — Z87440 Personal history of urinary (tract) infections: Secondary | ICD-10-CM

## 2021-03-04 DIAGNOSIS — J449 Chronic obstructive pulmonary disease, unspecified: Secondary | ICD-10-CM | POA: Diagnosis present

## 2021-03-04 DIAGNOSIS — F250 Schizoaffective disorder, bipolar type: Secondary | ICD-10-CM | POA: Diagnosis present

## 2021-03-04 DIAGNOSIS — F329 Major depressive disorder, single episode, unspecified: Secondary | ICD-10-CM | POA: Diagnosis present

## 2021-03-04 DIAGNOSIS — N179 Acute kidney failure, unspecified: Secondary | ICD-10-CM

## 2021-03-04 DIAGNOSIS — G9341 Metabolic encephalopathy: Secondary | ICD-10-CM | POA: Diagnosis present

## 2021-03-04 DIAGNOSIS — F315 Bipolar disorder, current episode depressed, severe, with psychotic features: Secondary | ICD-10-CM | POA: Diagnosis present

## 2021-03-04 DIAGNOSIS — Z8249 Family history of ischemic heart disease and other diseases of the circulatory system: Secondary | ICD-10-CM

## 2021-03-04 DIAGNOSIS — R4182 Altered mental status, unspecified: Secondary | ICD-10-CM

## 2021-03-04 DIAGNOSIS — R634 Abnormal weight loss: Secondary | ICD-10-CM | POA: Diagnosis present

## 2021-03-04 DIAGNOSIS — K5900 Constipation, unspecified: Secondary | ICD-10-CM | POA: Diagnosis present

## 2021-03-04 DIAGNOSIS — R7401 Elevation of levels of liver transaminase levels: Secondary | ICD-10-CM | POA: Diagnosis present

## 2021-03-04 DIAGNOSIS — Z9151 Personal history of suicidal behavior: Secondary | ICD-10-CM

## 2021-03-04 DIAGNOSIS — F411 Generalized anxiety disorder: Secondary | ICD-10-CM | POA: Diagnosis present

## 2021-03-04 DIAGNOSIS — R440 Auditory hallucinations: Secondary | ICD-10-CM | POA: Diagnosis not present

## 2021-03-04 DIAGNOSIS — R1310 Dysphagia, unspecified: Secondary | ICD-10-CM | POA: Diagnosis present

## 2021-03-04 DIAGNOSIS — I10 Essential (primary) hypertension: Secondary | ICD-10-CM | POA: Diagnosis present

## 2021-03-04 DIAGNOSIS — K219 Gastro-esophageal reflux disease without esophagitis: Secondary | ICD-10-CM | POA: Diagnosis present

## 2021-03-04 DIAGNOSIS — Z825 Family history of asthma and other chronic lower respiratory diseases: Secondary | ICD-10-CM

## 2021-03-04 DIAGNOSIS — K8020 Calculus of gallbladder without cholecystitis without obstruction: Secondary | ICD-10-CM | POA: Diagnosis present

## 2021-03-04 DIAGNOSIS — R519 Headache, unspecified: Secondary | ICD-10-CM | POA: Diagnosis present

## 2021-03-04 DIAGNOSIS — F4310 Post-traumatic stress disorder, unspecified: Secondary | ICD-10-CM | POA: Diagnosis present

## 2021-03-04 DIAGNOSIS — E860 Dehydration: Secondary | ICD-10-CM | POA: Diagnosis present

## 2021-03-04 DIAGNOSIS — F331 Major depressive disorder, recurrent, moderate: Secondary | ICD-10-CM | POA: Diagnosis not present

## 2021-03-04 DIAGNOSIS — W109XXA Fall (on) (from) unspecified stairs and steps, initial encounter: Secondary | ICD-10-CM | POA: Diagnosis present

## 2021-03-04 DIAGNOSIS — E876 Hypokalemia: Principal | ICD-10-CM | POA: Diagnosis present

## 2021-03-04 DIAGNOSIS — Z6832 Body mass index (BMI) 32.0-32.9, adult: Secondary | ICD-10-CM

## 2021-03-04 DIAGNOSIS — M25561 Pain in right knee: Secondary | ICD-10-CM | POA: Diagnosis present

## 2021-03-04 DIAGNOSIS — Z79899 Other long term (current) drug therapy: Secondary | ICD-10-CM

## 2021-03-04 DIAGNOSIS — Z880 Allergy status to penicillin: Secondary | ICD-10-CM

## 2021-03-04 DIAGNOSIS — Z20822 Contact with and (suspected) exposure to covid-19: Secondary | ICD-10-CM | POA: Diagnosis present

## 2021-03-04 DIAGNOSIS — G8929 Other chronic pain: Secondary | ICD-10-CM | POA: Diagnosis present

## 2021-03-04 DIAGNOSIS — R4189 Other symptoms and signs involving cognitive functions and awareness: Secondary | ICD-10-CM | POA: Diagnosis present

## 2021-03-04 DIAGNOSIS — R7989 Other specified abnormal findings of blood chemistry: Secondary | ICD-10-CM | POA: Diagnosis present

## 2021-03-04 DIAGNOSIS — Z8619 Personal history of other infectious and parasitic diseases: Secondary | ICD-10-CM

## 2021-03-04 DIAGNOSIS — F32A Depression, unspecified: Secondary | ICD-10-CM | POA: Diagnosis present

## 2021-03-04 DIAGNOSIS — F17200 Nicotine dependence, unspecified, uncomplicated: Secondary | ICD-10-CM | POA: Diagnosis present

## 2021-03-04 DIAGNOSIS — M25562 Pain in left knee: Secondary | ICD-10-CM | POA: Diagnosis present

## 2021-03-04 LAB — COMPREHENSIVE METABOLIC PANEL
ALT: 46 U/L — ABNORMAL HIGH (ref 0–44)
AST: 112 U/L — ABNORMAL HIGH (ref 15–41)
Albumin: 2.8 g/dL — ABNORMAL LOW (ref 3.5–5.0)
Alkaline Phosphatase: 50 U/L (ref 38–126)
Anion gap: 11 (ref 5–15)
BUN: 20 mg/dL (ref 6–20)
CO2: 26 mmol/L (ref 22–32)
Calcium: 7.3 mg/dL — ABNORMAL LOW (ref 8.9–10.3)
Chloride: 102 mmol/L (ref 98–111)
Creatinine, Ser: 1.47 mg/dL — ABNORMAL HIGH (ref 0.44–1.00)
GFR, Estimated: 41 mL/min — ABNORMAL LOW (ref >60)
Glucose, Bld: 83 mg/dL (ref 70–99)
Potassium: 2.4 mmol/L — CL (ref 3.5–5.1)
Sodium: 139 mmol/L (ref 135–145)
Total Bilirubin: 0.6 mg/dL (ref 0.3–1.2)
Total Protein: 5.7 g/dL — ABNORMAL LOW (ref 6.5–8.1)

## 2021-03-04 LAB — CBC WITH DIFFERENTIAL/PLATELET
Abs Immature Granulocytes: 0.03 10*3/uL (ref 0.00–0.07)
Basophils Absolute: 0 10*3/uL (ref 0.0–0.1)
Basophils Relative: 0 %
Eosinophils Absolute: 0 10*3/uL (ref 0.0–0.5)
Eosinophils Relative: 0 %
HCT: 36.6 % (ref 36.0–46.0)
Hemoglobin: 12.3 g/dL (ref 12.0–15.0)
Immature Granulocytes: 1 %
Lymphocytes Relative: 23 %
Lymphs Abs: 1.2 10*3/uL (ref 0.7–4.0)
MCH: 30.1 pg (ref 26.0–34.0)
MCHC: 33.6 g/dL (ref 30.0–36.0)
MCV: 89.7 fL (ref 80.0–100.0)
Monocytes Absolute: 1 10*3/uL (ref 0.1–1.0)
Monocytes Relative: 19 %
Neutro Abs: 3 10*3/uL (ref 1.7–7.7)
Neutrophils Relative %: 57 %
Platelets: 202 10*3/uL (ref 150–400)
RBC: 4.08 MIL/uL (ref 3.87–5.11)
RDW: 13.4 % (ref 11.5–15.5)
WBC: 5.3 10*3/uL (ref 4.0–10.5)
nRBC: 0 % (ref 0.0–0.2)

## 2021-03-04 LAB — ETHANOL: Alcohol, Ethyl (B): <10 mg/dL (ref <10)

## 2021-03-04 LAB — RESP PANEL BY RT-PCR (FLU A&B, COVID) ARPGX2
Influenza A by PCR: NEGATIVE
Influenza B by PCR: NEGATIVE
SARS Coronavirus 2 by RT PCR: NEGATIVE

## 2021-03-04 LAB — AMMONIA: Ammonia: 26 umol/L (ref 9–35)

## 2021-03-04 LAB — MAGNESIUM: Magnesium: 2.3 mg/dL (ref 1.7–2.4)

## 2021-03-04 IMAGING — CT CT CERVICAL SPINE W/O CM
3 of 4 series · 13 of 33 positions shown, 16 images · non-contrast
Comparison: [DATE] and prior studies

CLINICAL DATA: 59-year-old female with fall, confusion and altered
mental status.

EXAM:
CT HEAD WITHOUT CONTRAST
CT CERVICAL SPINE WITHOUT CONTRAST
TECHNIQUE: Multidetector CT imaging of the head and cervical spine was
performed following the standard protocol without intravenous
contrast. Multiplanar CT image reconstructions of the cervical spine
were also generated.

[Series 6: orthogonal bone · axial · 0.23mm/px · z∈[-363,-253]mm · 5 of 84 slices shown, 7 images]
[im 14/84  soft-tissue]
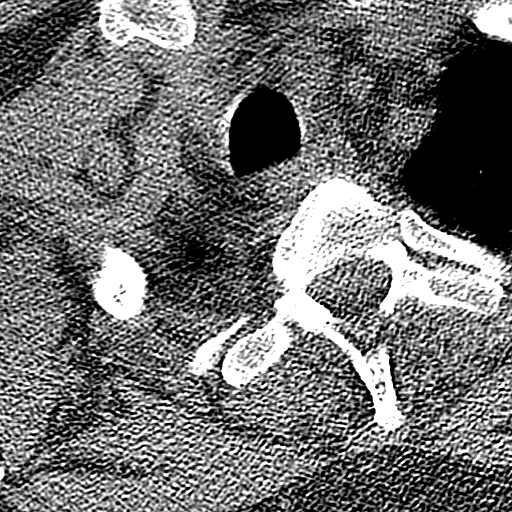
[im 14/84  bone]
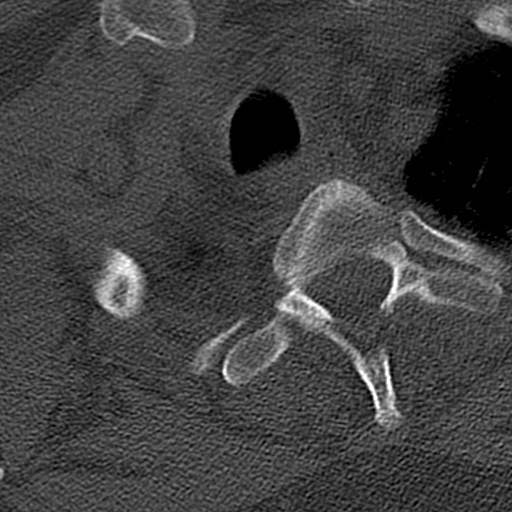
[im 28/84  bone]
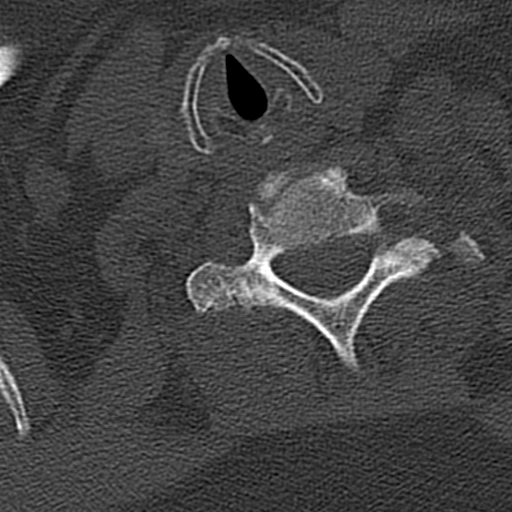
[im 42/84  bone]
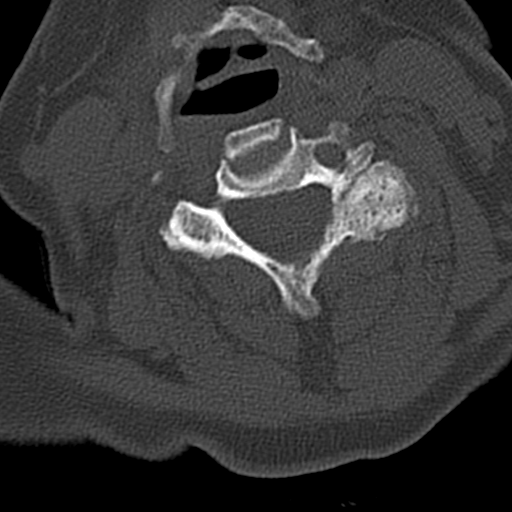
[im 56/84  bone]
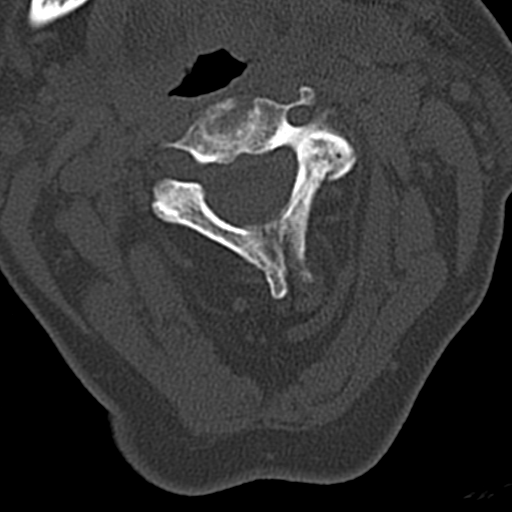
[im 70/84  soft-tissue]
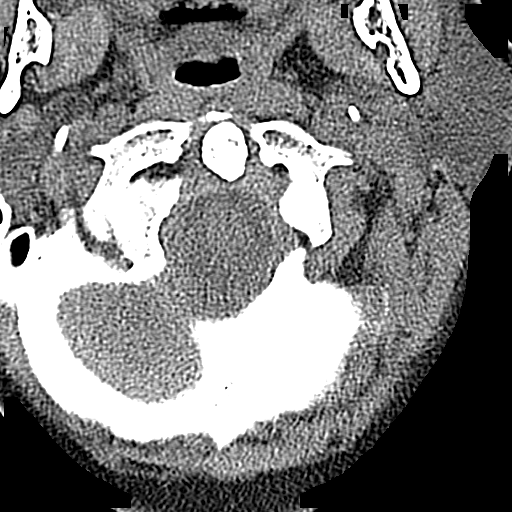
[im 70/84  bone]
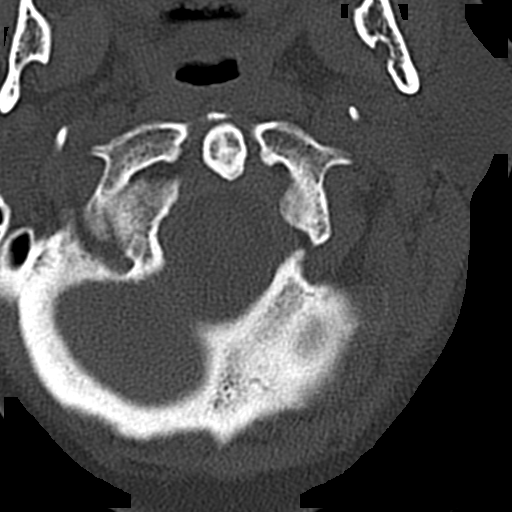

[Series 7: coronal bone · coronal · 0.26mm/px · 3 of 61 slices shown]
[im 13/61  bone]
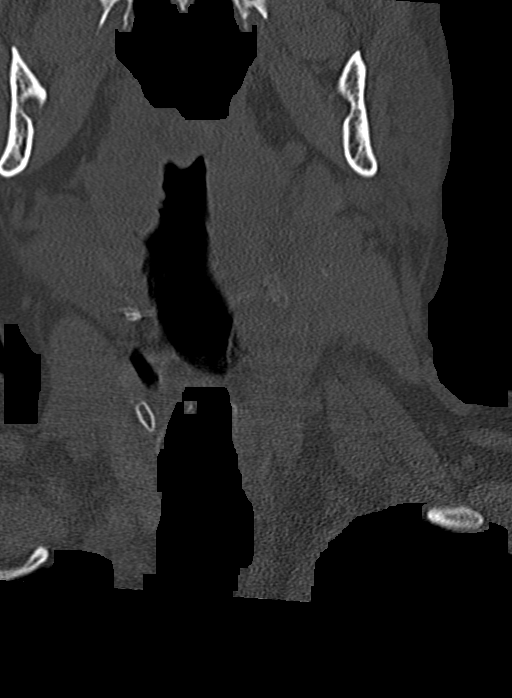
[im 25/61  bone]
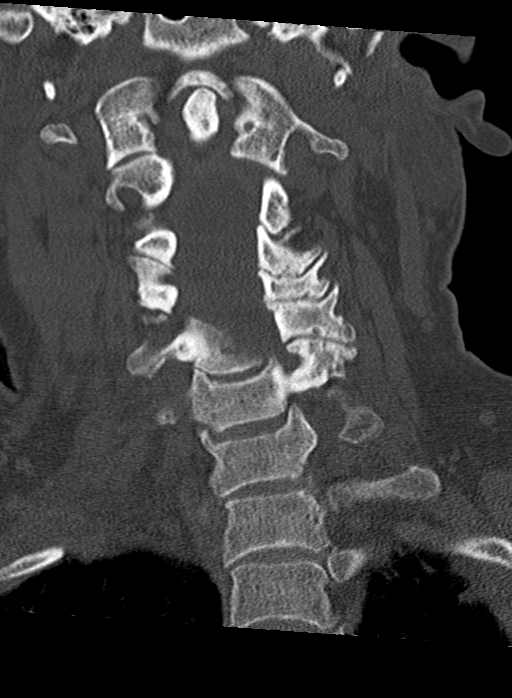
[im 37/61  bone]
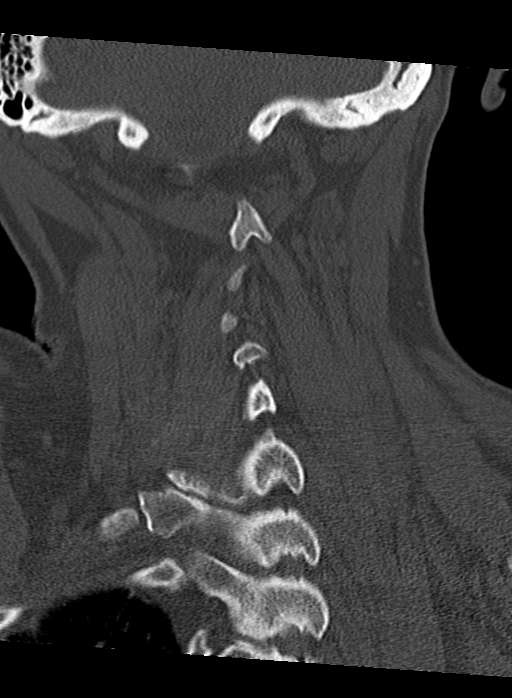

[Series 8: sagittal bone · sagittal · 0.23mm/px · 5 of 63 slices shown, 6 images]
[im 21/63  bone]
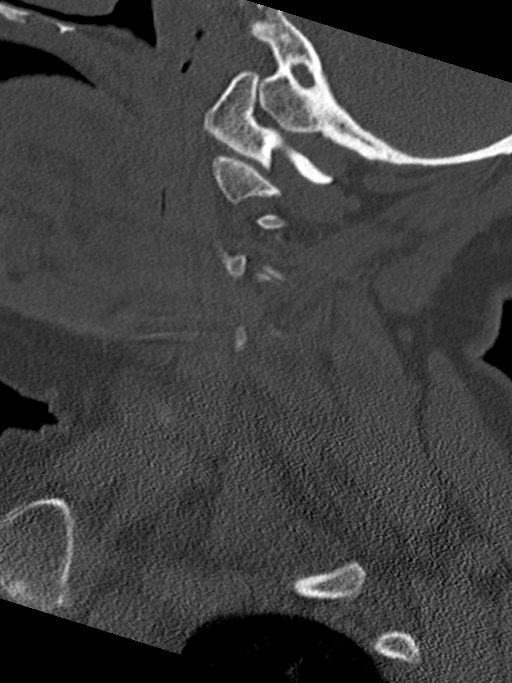
[im 26/63  bone]
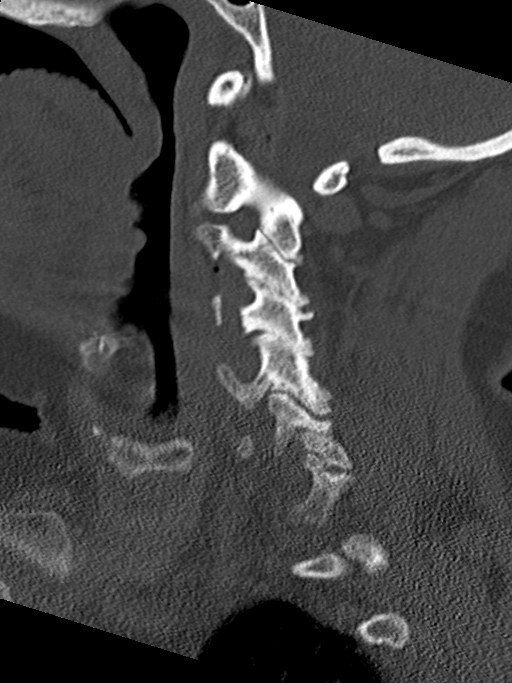
[im 32/63  soft-tissue]
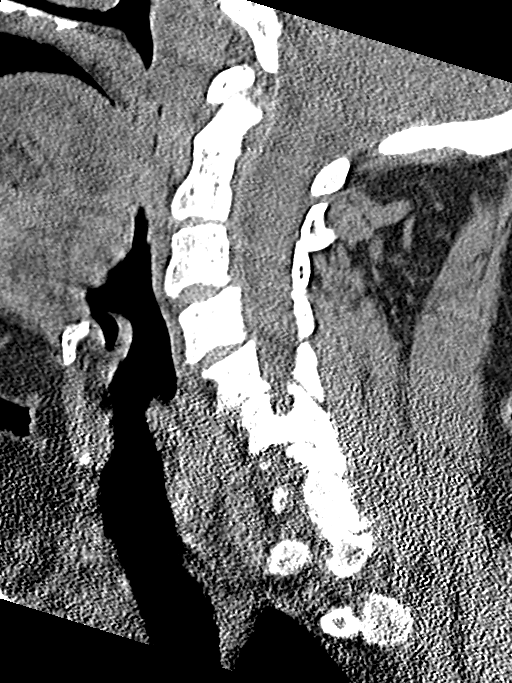
[im 32/63  bone]
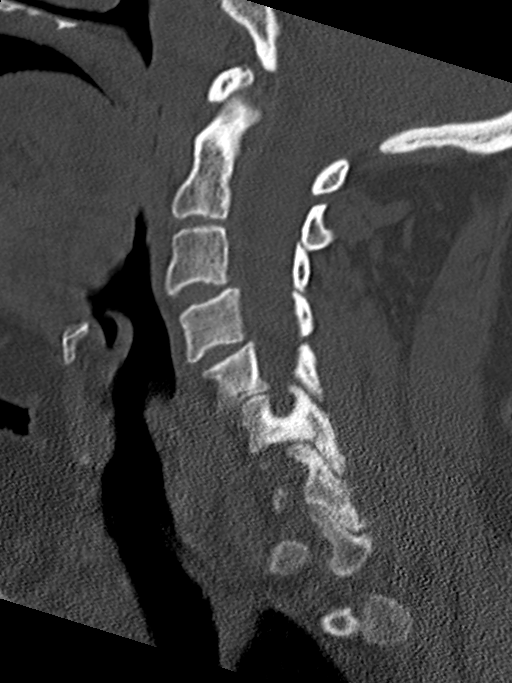
[im 37/63  bone]
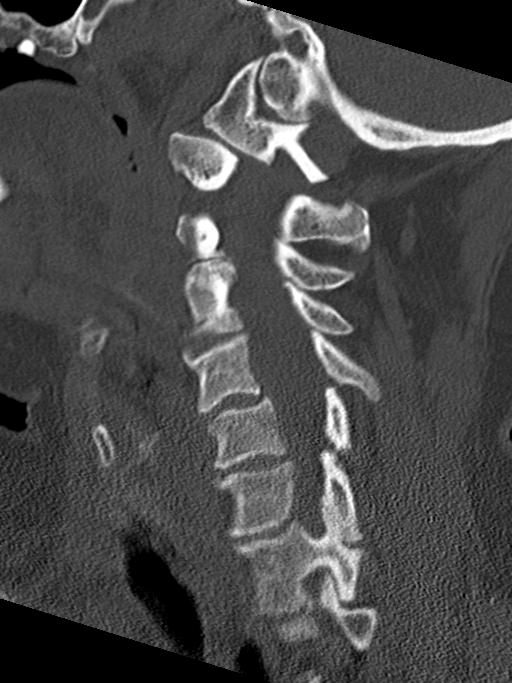
[im 42/63  bone]
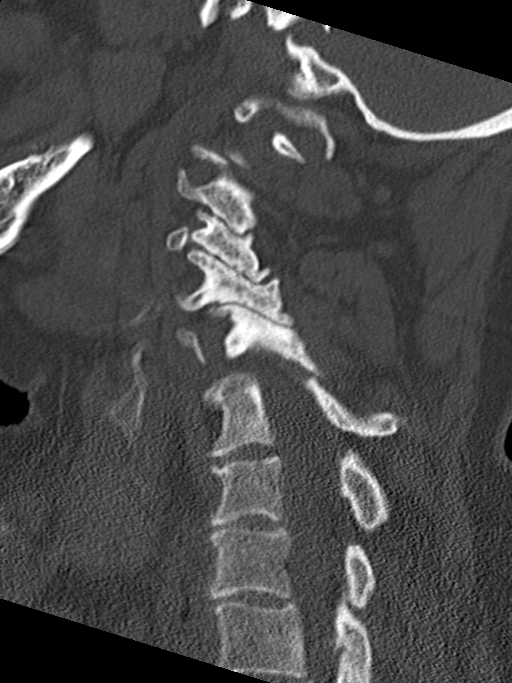

[13 of 33 positions shown; findings below may reference images not displayed]

FINDINGS: CT HEAD FINDINGS

Brain: No evidence of acute infarction, hemorrhage, hydrocephalus,
extra-axial collection or mass lesion/mass effect.

White matter hypodensities are unchanged.

Vascular: No hyperdense vessel or unexpected calcification.

Skull: Normal. Negative for fracture or focal lesion.

Sinuses/Orbits: A LEFT mastoid effusion is again noted.

Other: None

CT CERVICAL SPINE FINDINGS

Alignment: Normal.

Skull base and vertebrae: No acute fracture. No primary bone lesion
or focal pathologic process.

Soft tissues and spinal canal: No prevertebral fluid or swelling. No
visible canal hematoma.

Disc levels: Mild multilevel degenerative disc disease/spondylosis
and moderate multilevel facet arthropathy again noted.

Upper chest: No acute abnormality

Other: None
IMPRESSION: 1. No evidence of acute intracranial abnormality. Chronic
small-vessel white matter ischemic changes.
2. No static evidence of acute injury to the cervical spine.

## 2021-03-04 IMAGING — DX DG CHEST 1V PORT
1 series · 1 of 1 positions shown · non-contrast
Comparison: [DATE]

CLINICAL DATA: Altered mental status

EXAM:
PORTABLE CHEST 1 VIEW

[chest ap]
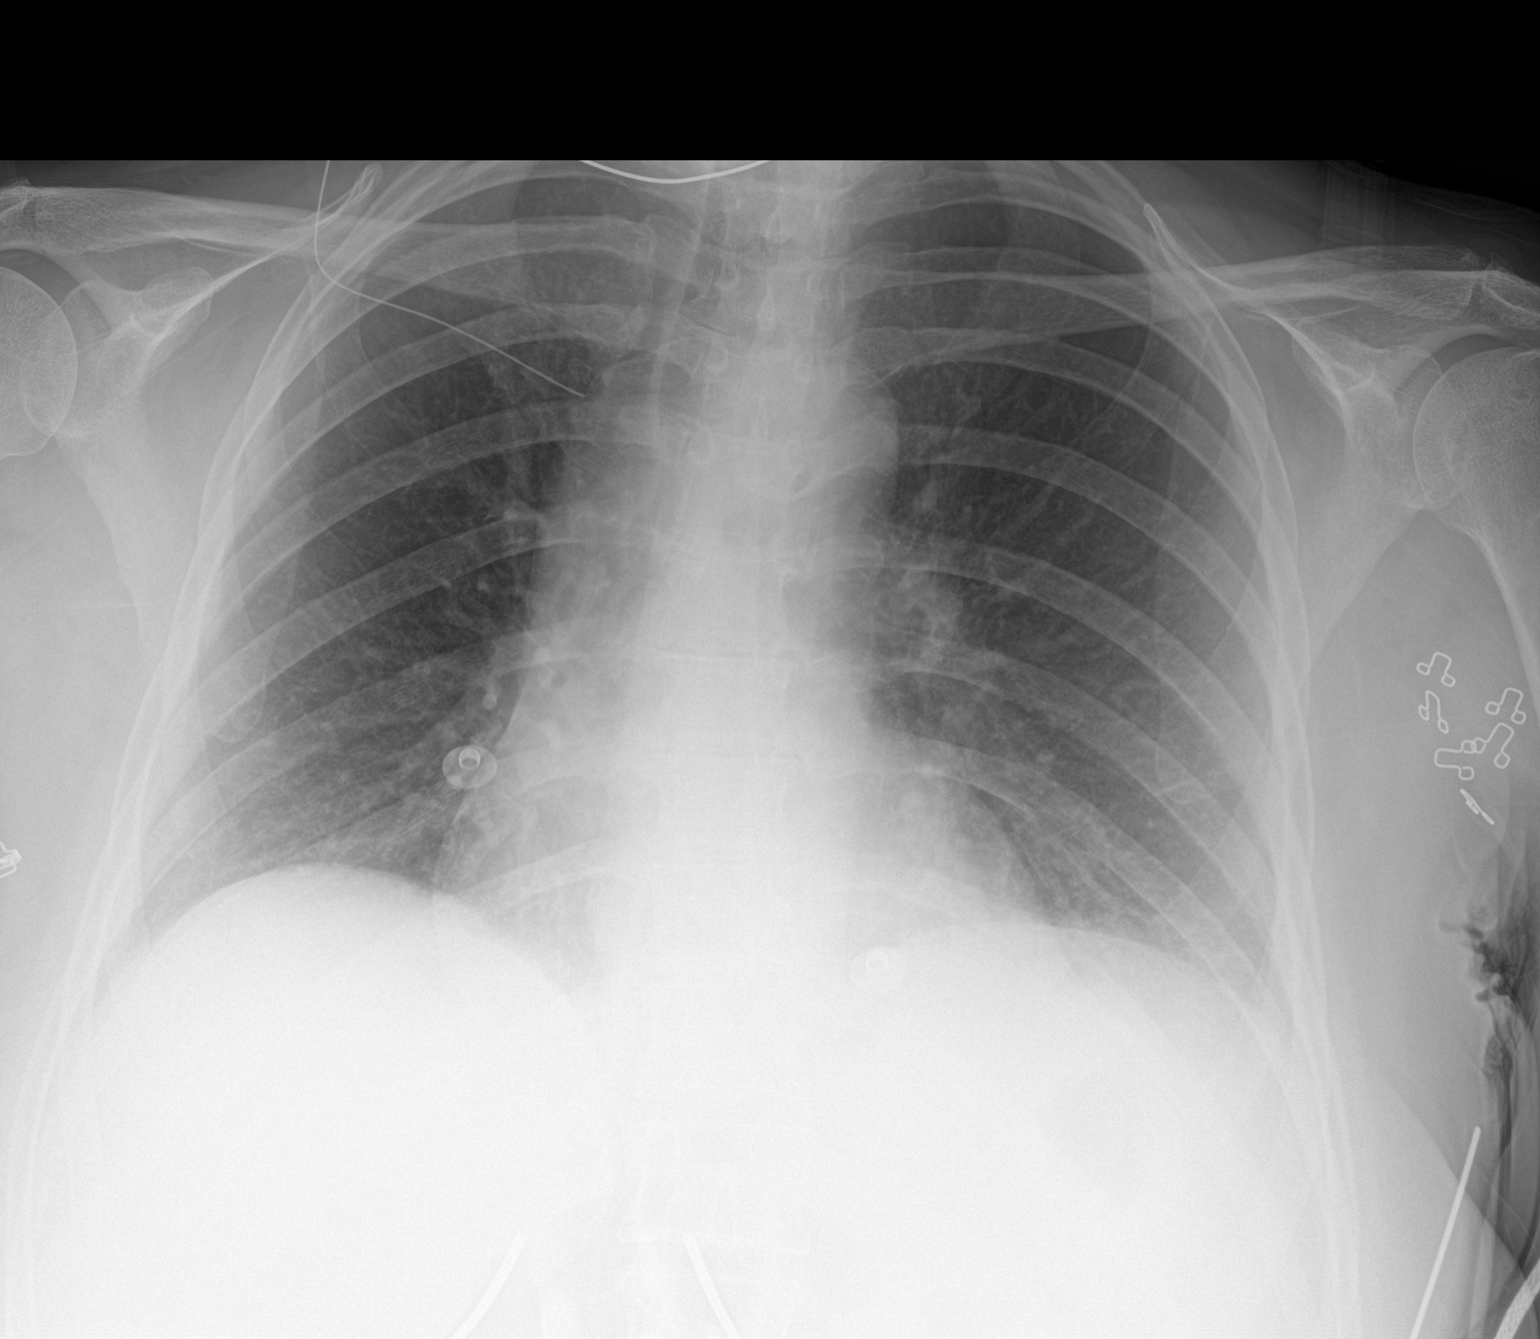

[1 of 1 positions shown; findings below may reference images not displayed]

FINDINGS: This is a low volume film with mild bibasilar atelectasis.

There is no evidence of focal airspace disease, pulmonary edema,
suspicious pulmonary nodule/mass, pleural effusion, or pneumothorax.

No acute bony abnormalities are identified.
IMPRESSION: Low volume film with mild bibasilar atelectasis.

## 2021-03-04 IMAGING — CT CT HEAD W/O CM
3 series · 14 of 47 positions shown, 16 images · non-contrast
Comparison: [DATE] and prior studies

CLINICAL DATA: 59-year-old female with fall, confusion and altered
mental status.

EXAM:
CT HEAD WITHOUT CONTRAST
CT CERVICAL SPINE WITHOUT CONTRAST
TECHNIQUE: Multidetector CT imaging of the head and cervical spine was
performed following the standard protocol without intravenous
contrast. Multiplanar CT image reconstructions of the cervical spine
were also generated.

[Series 3: head wo · axial · 0.47mm/px · z∈[-224,-89]mm · 8 of 33 slices shown, 10 images]
[im 3/33  brain]
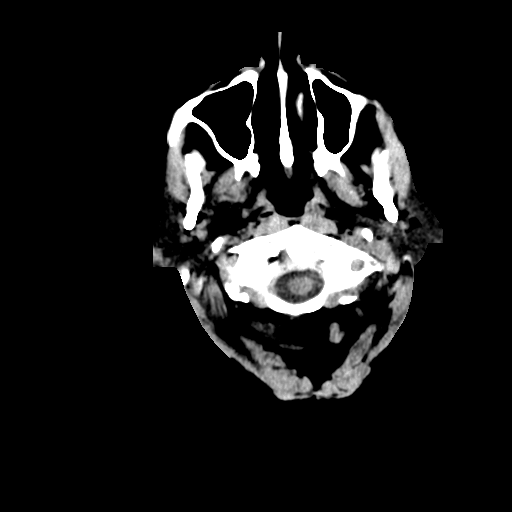
[im 3/33  bone]
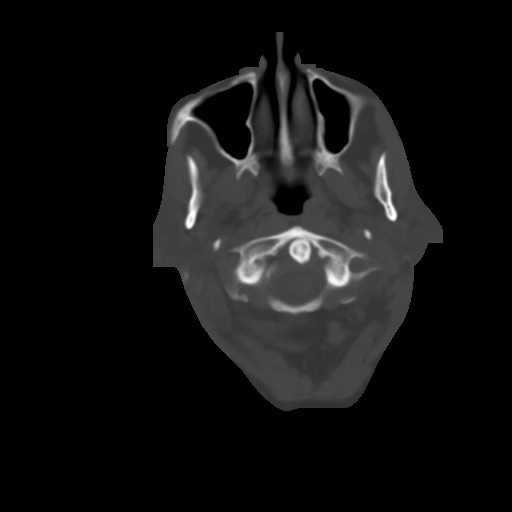
[im 7/33  brain]
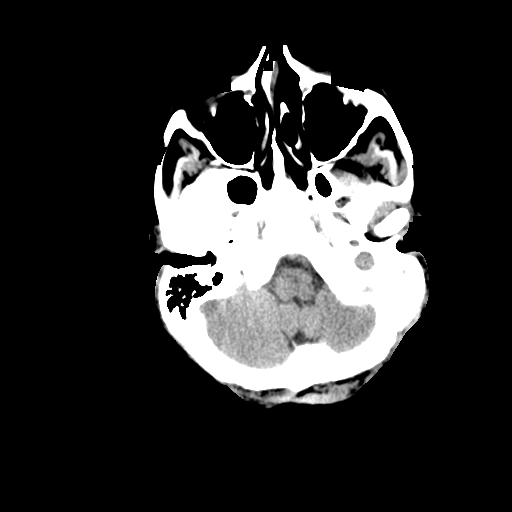
[im 10/33  brain]
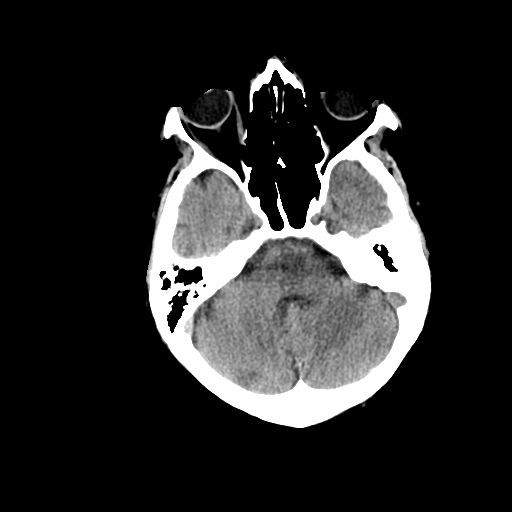
[im 15/33  brain]
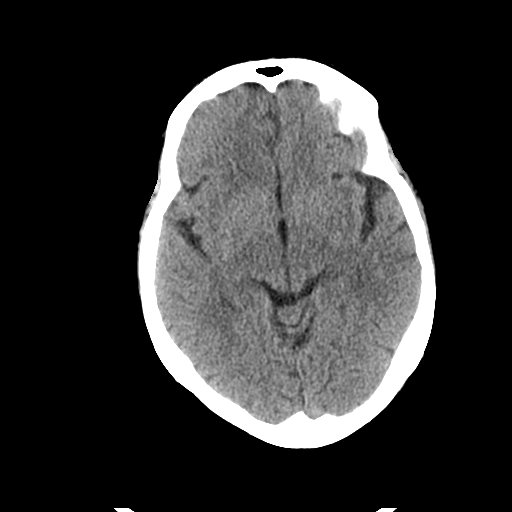
[im 18/33  brain]
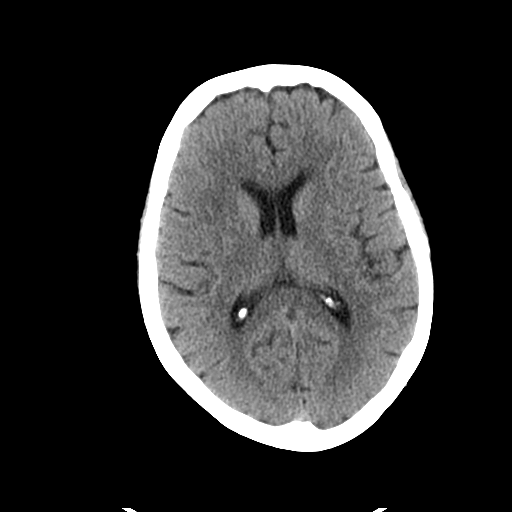
[im 18/33  bone]
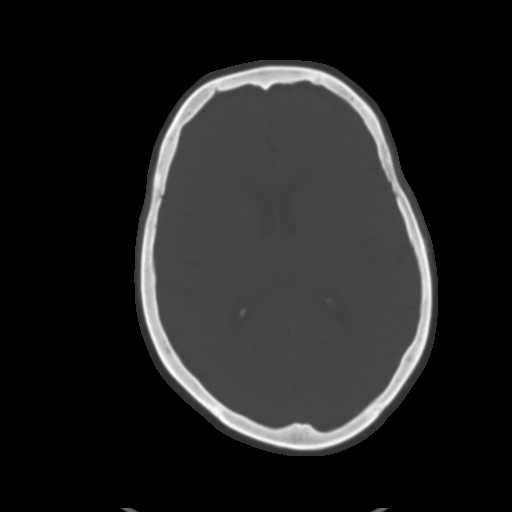
[im 23/33  brain]
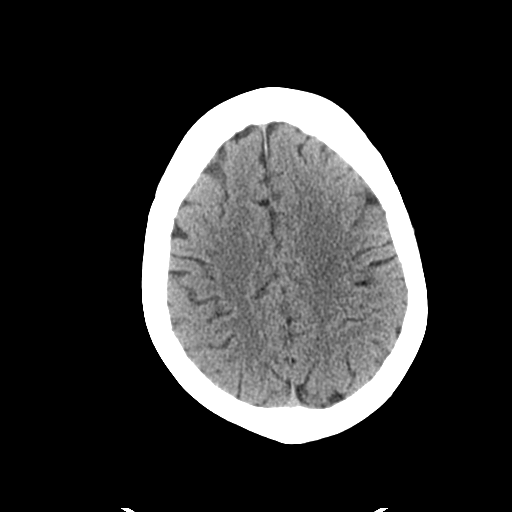
[im 26/33  brain]
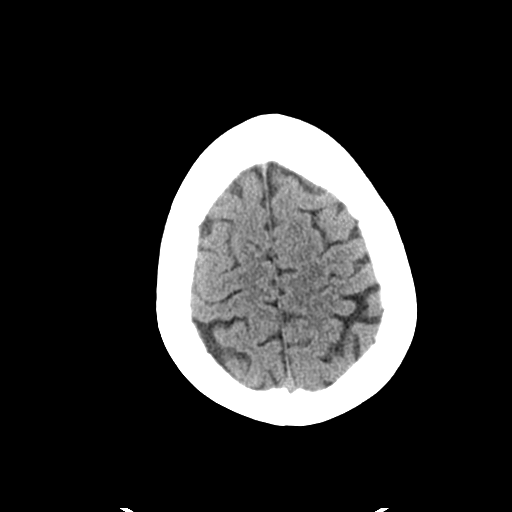
[im 30/33  brain]
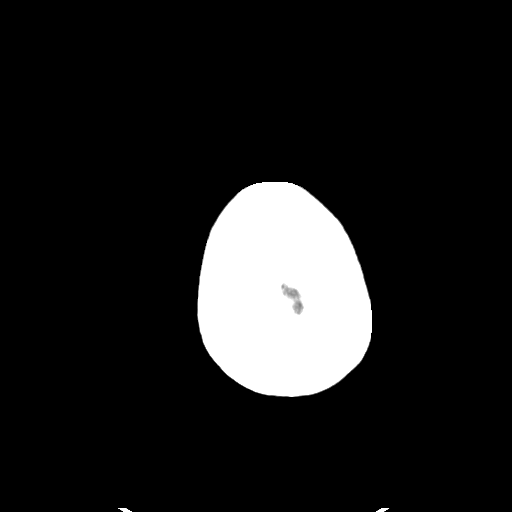

[Series 5: coronal soft tissue · coronal · 0.32mm/px · 3 of 72 slices shown]
[im 24/72  brain]
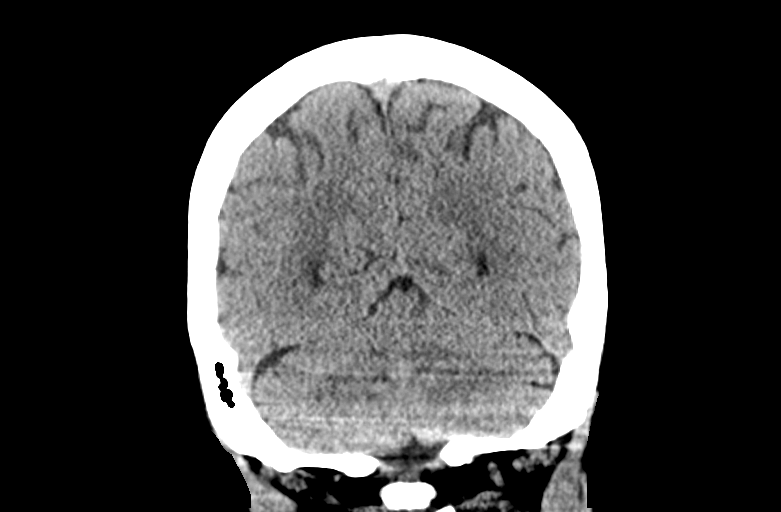
[im 32/72  brain]
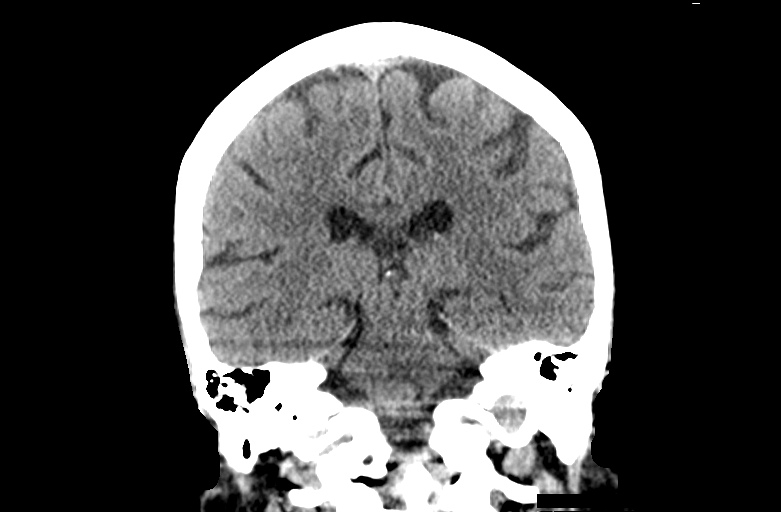
[im 40/72  brain]
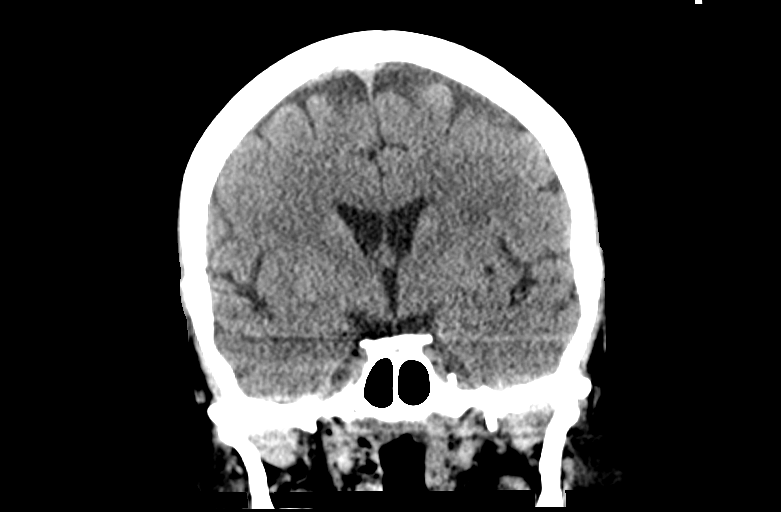

[Series 7: sagittal soft tissue · sagittal · 0.32mm/px · 3 of 54 slices shown]
[im 18/54  brain]
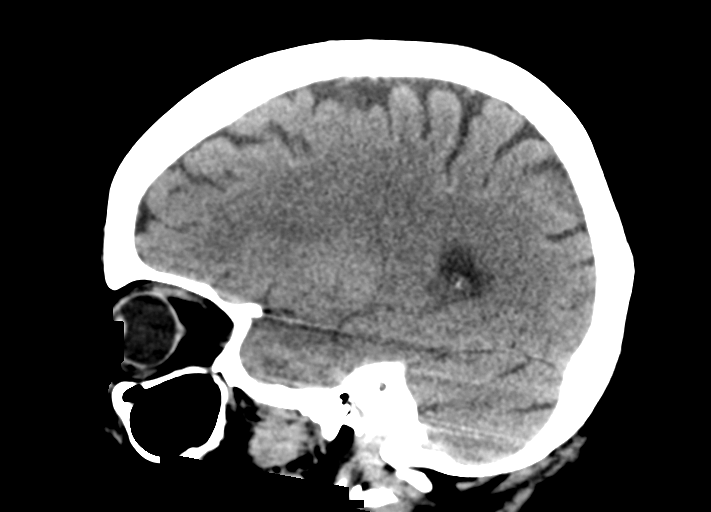
[im 27/54  brain]
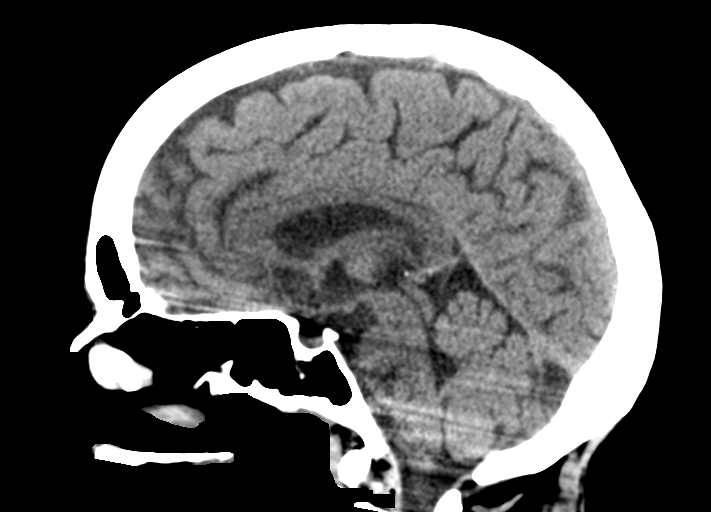
[im 36/54  brain]
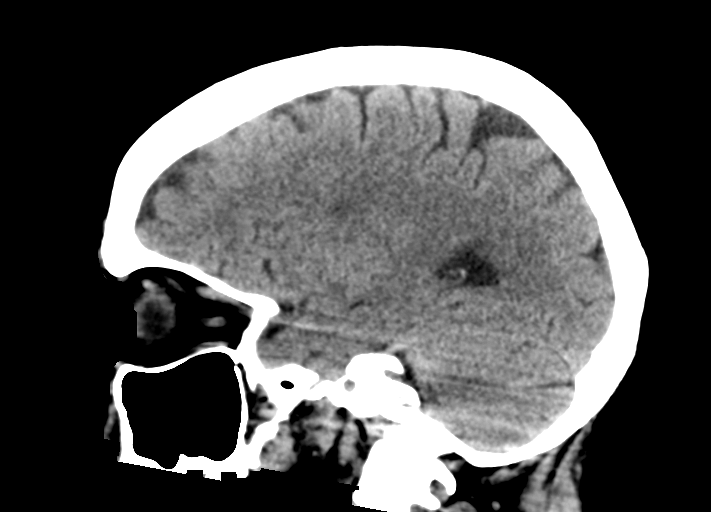

[14 of 47 positions shown; findings below may reference images not displayed]

FINDINGS: CT HEAD FINDINGS

Brain: No evidence of acute infarction, hemorrhage, hydrocephalus,
extra-axial collection or mass lesion/mass effect.

White matter hypodensities are unchanged.

Vascular: No hyperdense vessel or unexpected calcification.

Skull: Normal. Negative for fracture or focal lesion.

Sinuses/Orbits: A LEFT mastoid effusion is again noted.

Other: None

CT CERVICAL SPINE FINDINGS

Alignment: Normal.

Skull base and vertebrae: No acute fracture. No primary bone lesion
or focal pathologic process.

Soft tissues and spinal canal: No prevertebral fluid or swelling. No
visible canal hematoma.

Disc levels: Mild multilevel degenerative disc disease/spondylosis
and moderate multilevel facet arthropathy again noted.

Upper chest: No acute abnormality

Other: None
IMPRESSION: 1. No evidence of acute intracranial abnormality. Chronic
small-vessel white matter ischemic changes.
2. No static evidence of acute injury to the cervical spine.

## 2021-03-04 MED ORDER — UMECLIDINIUM-VILANTEROL 62.5-25 MCG/INH IN AEPB
1.0000 | INHALATION_SPRAY | Freq: Every day | RESPIRATORY_TRACT | Status: DC
Start: 2021-03-05 — End: 2021-03-13
  Administered 2021-03-06 – 2021-03-13 (×8): 1 via RESPIRATORY_TRACT
  Filled 2021-03-04 (×2): qty 14

## 2021-03-04 MED ORDER — POTASSIUM CHLORIDE CRYS ER 20 MEQ PO TBCR
40.0000 meq | EXTENDED_RELEASE_TABLET | Freq: Once | ORAL | Status: AC
  Administered 2021-03-05: 40 meq via ORAL
  Filled 2021-03-04: qty 2

## 2021-03-04 MED ORDER — NICOTINE 21 MG/24HR TD PT24
21.0000 mg | MEDICATED_PATCH | Freq: Every day | TRANSDERMAL | Status: DC
Start: 2021-03-04 — End: 2021-03-13
  Administered 2021-03-04 – 2021-03-13 (×10): 21 mg via TRANSDERMAL
  Filled 2021-03-04 (×10): qty 1

## 2021-03-04 MED ORDER — POTASSIUM CHLORIDE 10 MEQ/100ML IV SOLN
10.0000 meq | INTRAVENOUS | Status: AC
  Administered 2021-03-04 (×2): 10 meq via INTRAVENOUS
  Filled 2021-03-04 (×2): qty 100

## 2021-03-04 MED ORDER — ONDANSETRON HCL 4 MG/2ML IJ SOLN
4.0000 mg | Freq: Four times a day (QID) | INTRAMUSCULAR | Status: DC | PRN
  Administered 2021-03-07: 4 mg via INTRAVENOUS
  Filled 2021-03-04: qty 2

## 2021-03-04 MED ORDER — BENZTROPINE MESYLATE 0.5 MG PO TABS
1.0000 mg | ORAL_TABLET | Freq: Two times a day (BID) | ORAL | Status: DC
  Administered 2021-03-04 – 2021-03-13 (×18): 1 mg via ORAL
  Filled 2021-03-04 (×18): qty 2

## 2021-03-04 MED ORDER — ACETAMINOPHEN 650 MG RE SUPP: 650.0000 mg | Freq: Four times a day (QID) | RECTAL | Status: DC | PRN

## 2021-03-04 MED ORDER — FLUTICASONE PROPIONATE 50 MCG/ACT NA SUSP
2.0000 | Freq: Every day | NASAL | Status: DC
Start: 2021-03-05 — End: 2021-03-13
  Administered 2021-03-05 – 2021-03-13 (×9): 2 via NASAL
  Filled 2021-03-04: qty 16

## 2021-03-04 MED ORDER — DIVALPROEX SODIUM 250 MG PO DR TAB
500.0000 mg | DELAYED_RELEASE_TABLET | Freq: Two times a day (BID) | ORAL | Status: DC
  Administered 2021-03-04 – 2021-03-06 (×4): 500 mg via ORAL
  Filled 2021-03-04 (×4): qty 2

## 2021-03-04 MED ORDER — ALBUTEROL SULFATE (2.5 MG/3ML) 0.083% IN NEBU
3.0000 mL | INHALATION_SOLUTION | RESPIRATORY_TRACT | Status: DC | PRN
Start: 2021-03-04 — End: 2021-03-13
  Administered 2021-03-09: 3 mL via RESPIRATORY_TRACT
  Filled 2021-03-04: qty 3

## 2021-03-04 MED ORDER — SODIUM CHLORIDE 0.9 % IV BOLUS
1000.0000 mL | Freq: Once | INTRAVENOUS | Status: AC
  Administered 2021-03-04: 1000 mL via INTRAVENOUS

## 2021-03-04 MED ORDER — ADULT MULTIVITAMIN W/MINERALS CH
1.0000 | ORAL_TABLET | Freq: Every day | ORAL | Status: DC
  Administered 2021-03-05 – 2021-03-13 (×9): 1 via ORAL
  Filled 2021-03-04 (×9): qty 1

## 2021-03-04 MED ORDER — POTASSIUM CHLORIDE CRYS ER 20 MEQ PO TBCR
40.0000 meq | EXTENDED_RELEASE_TABLET | Freq: Once | ORAL | Status: AC
  Administered 2021-03-04: 40 meq via ORAL
  Filled 2021-03-04: qty 2

## 2021-03-04 MED ORDER — PANTOPRAZOLE SODIUM 40 MG IV SOLR
40.0000 mg | Freq: Every day | INTRAVENOUS | Status: DC
  Administered 2021-03-04 – 2021-03-06 (×3): 40 mg via INTRAVENOUS
  Filled 2021-03-04 (×3): qty 40

## 2021-03-04 MED ORDER — HYDROXYZINE HCL 25 MG PO TABS
50.0000 mg | ORAL_TABLET | Freq: Three times a day (TID) | ORAL | Status: DC
Start: 2021-03-04 — End: 2021-03-04

## 2021-03-04 MED ORDER — SODIUM CHLORIDE 0.9 % IV SOLN: INTRAVENOUS | Status: DC

## 2021-03-04 MED ORDER — POTASSIUM CHLORIDE 10 MEQ/100ML IV SOLN
10.0000 meq | INTRAVENOUS | Status: AC
  Administered 2021-03-04 – 2021-03-05 (×4): 10 meq via INTRAVENOUS
  Filled 2021-03-04 (×4): qty 100

## 2021-03-04 MED ORDER — THIAMINE HCL 100 MG PO TABS
100.0000 mg | ORAL_TABLET | Freq: Every day | ORAL | Status: DC
  Administered 2021-03-04 – 2021-03-13 (×10): 100 mg via ORAL
  Filled 2021-03-04 (×10): qty 1

## 2021-03-04 MED ORDER — ONDANSETRON HCL 4 MG PO TABS: 4.0000 mg | ORAL_TABLET | Freq: Four times a day (QID) | ORAL | Status: DC | PRN

## 2021-03-04 MED ORDER — ACETAMINOPHEN 325 MG PO TABS
650.0000 mg | ORAL_TABLET | Freq: Four times a day (QID) | ORAL | Status: DC | PRN
  Administered 2021-03-07: 650 mg via ORAL
  Filled 2021-03-04: qty 2

## 2021-03-04 MED ORDER — FOLIC ACID 1 MG PO TABS
1.0000 mg | ORAL_TABLET | Freq: Every day | ORAL | Status: DC
  Administered 2021-03-05 – 2021-03-13 (×9): 1 mg via ORAL
  Filled 2021-03-04 (×9): qty 1

## 2021-03-04 MED ORDER — ZIPRASIDONE HCL 40 MG PO CAPS: 40.0000 mg | ORAL_CAPSULE | Freq: Two times a day (BID) | ORAL | Status: DC

## 2021-03-04 MED ORDER — ENOXAPARIN SODIUM 40 MG/0.4ML IJ SOSY
40.0000 mg | PREFILLED_SYRINGE | INTRAMUSCULAR | Status: DC
  Administered 2021-03-04 – 2021-03-12 (×9): 40 mg via SUBCUTANEOUS
  Filled 2021-03-04 (×9): qty 0.4

## 2021-03-04 MED ORDER — CLONAZEPAM 0.5 MG PO TABS
0.5000 mg | ORAL_TABLET | Freq: Two times a day (BID) | ORAL | Status: DC
  Administered 2021-03-04 – 2021-03-13 (×18): 0.5 mg via ORAL
  Filled 2021-03-04 (×18): qty 1

## 2021-03-04 MED ORDER — PANTOPRAZOLE SODIUM 40 MG IV SOLR: 40.0000 mg | INTRAVENOUS | Status: DC

## 2021-03-04 NOTE — H&P (Addendum)
History and Physical    MAUDE HETTICH EKC:[id number] DOB: 09-04-1960 DOA: 03/04/2021  PCP: [name], NP  Patient coming from: Home  I have personally briefly reviewed patient's old medical records in [facility]  Chief Complaint: Fall/altered mental status  HPI: Michaela Wells is a 60 y.o. female with medical history significant of bipolar 1 disorder, PTSD, schizo affective disorder, COPD recently hospitalized 02/26/2021-02/27/2021 for hypokalemia/acute kidney injury and noted at that time to have hallucinations who presents back to the ED after a fall this morning.  Patient is a poor historian and as such history obtained from ED physician and from patient's daughter. Patient's daughter notes that she woke up to a loud boom and noted that her mother had fallen down the stairs.  Per daughter mother has been very fatigued ever since discharge from the hospital to the point where she can barely walk and significantly weak to hold up her cell phone and with bouts of lethargy and confusion to the point whereby she could not recognize her daughter today.  Per daughter patient has had significant decreased oral intake and has only been on liquids for the past week as her mother states when she tries to swallow solids it feels like a knife is cutting through her throat.  Patient also noted to be having worsening auditory and visual hallucinations stating that the mother sees a guy with horses outside and is also concerned that someone is going to poison her and her sister was trying to kill her.  It is also noted that patient feels her neighbors have placed a spell on her heart.  It is also noted that patient has had a 20 pound weight loss over the past month. Per daughter patient has not had any fevers, no chills, no emesis, no melena, no diarrhea, no constipation, no hematemesis, no syncopal episodes.  Mother at has endorsed some bouts of nausea and some abdominal pain however daughter unclear as  to what to believe.  Daughter is concerned and mother brought to the hospital.  ED Course: Patient seen in the ED head CT done unremarkable.  Chest x-ray unremarkable.  Comprehensive metabolic profile with a potassium of 2.4, creatinine of 1.47, calcium of 7.3, AST of 112, ALT of 46, albumin of 2.8 otherwise was within normal limits.  CBC unremarkable.  SARS coronavirus 2 PCR negative.  Urinalysis ordered and pending.  EKG with normal sinus rhythm with poor R wave progression.  No ischemic changes noted.  IV potassium ordered for patient and patient received a bolus of IV fluids.  Hospitalist were called to admit the patient for further evaluation and management.  Review of Systems: As per HPI otherwise all other systems reviewed and are negative.  Past Medical History:  Diagnosis Date   Bronchial asthma    Chicken pox    COPD (chronic obstructive pulmonary disease) (HCC)    Depression    Frequent headaches    GERD (gastroesophageal reflux disease)    UTI (lower urinary tract infection)     Past Surgical History:  Procedure Laterality Date   ESOPHAGEAL DILATION     TONSILLECTOMY      Social History  reports that she has been smoking. She has never used smokeless tobacco. She reports current alcohol use. She reports previous drug use.  Allergies  Allergen Reactions   Penicillins Other (See Comments)    Childhood allergy- Reaction?? Has patient had a PCN reaction causing immediate rash, facial/tongue/throat swelling, SOB or lightheadedness  with hypotension: Unknown Has patient had a PCN reaction causing severe rash involving mucus membranes or skin necrosis: Unknown Has patient had a PCN reaction that required hospitalization: Unknown Has patient had a PCN reaction occurring within the last 10 years: Unknown If all of the above answers are "NO", then may proceed with Cephalosporin use.      Family History  Problem Relation Age of Onset   Other Mother        MVA-Deceased [pt was  age 47]   COPD Father        Deceased   Heart attack Maternal Grandmother    Stroke Maternal Grandmother    Brain cancer Maternal Grandmother    Arthritis/Rheumatoid Maternal Grandmother    Heart attack Maternal Grandfather    Stroke Maternal Grandfather    Heart disease Maternal Aunt    Stroke Maternal Aunt        #1   Heart attack Maternal Aunt        #1   Hypertension Maternal Aunt        #1   Arthritis/Rheumatoid Sister    COPD Sister    Heart disease Maternal Uncle    Heart attack Maternal Uncle        x4   Heart disease Brother        #1   Heart attack Brother        #1   Diabetes Brother    Diabetes Maternal Aunt    Mental retardation Maternal Aunt    Healthy Daughter        x2   Drug abuse Daughter        #1   Drug abuse Daughter        #2   Kidney Stones Daughter        #2   Other Son        Agoraphobia   Mother deceased from MVA.  Father deceased from COPD.  Prior to Admission medications   Medication Sig Start Date End Date Taking? Authorizing Provider  albuterol (VENTOLIN HFA) 108 (90 Base) MCG/ACT inhaler Inhale 1 puff into the lungs every 4 (four) hours as needed for wheezing or shortness of breath. Ventolin 01/16/21   Zonia Kief, Amy J, NP  benztropine (COGENTIN) 1 MG tablet TAKE 1 TABLET(1 MG) BY MOUTH TWICE DAILY 02/05/21   Toy Cookey E, NP  busPIRone (BUSPAR) 15 MG tablet Take 1 tablet (15 mg total) by mouth 3 (three) times daily. 02/05/21   Shanna Cisco, NP  clonazePAM (KLONOPIN) 0.5 MG tablet TAKE 1 TABLET(0.5 MG) BY MOUTH TWICE DAILY 02/05/21   Toy Cookey E, NP  divalproex (DEPAKOTE) 500 MG DR tablet Take 1 tablet (500 mg total) by mouth 2 (two) times daily. 02/05/21   Shanna Cisco, NP  fluticasone (FLONASE) 50 MCG/ACT nasal spray Place 2 sprays into both nostrils daily. 10/15/19   [provider]  hydrochlorothiazide (HYDRODIURIL) 12.5 MG tablet TAKE 1 TABLET(12.5 MG) BY MOUTH DAILY 02/15/21   [name], NP   hydrOXYzine (ATARAX/VISTARIL) 50 MG tablet Take 1 tablet (50 mg total) by mouth 3 (three) times daily. 02/05/21   Shanna Cisco, NP  nicotine (NICODERM CQ - DOSED IN MG/24 HOURS) 21 mg/24hr patch Place 1 patch (21 mg total) onto the skin daily. 02/05/21   Shanna Cisco, NP  umeclidinium-vilanterol (ANORO ELLIPTA) 62.5-25 MCG/INH AEPB Inhale 1 puff into the lungs daily. 01/16/21   [name], NP  ziprasidone (GEODON) 20 MG capsule Take  2 capsules (40 mg total) by mouth 2 (two) times daily with a meal. 02/05/21   Shanna CiscoParsons, Brittney E, NP  carvedilol (COREG) 25 MG tablet Take 1 tablet (25 mg total) by mouth 2 (two) times daily with a meal. Patient not taking: Reported on 02/26/2021 10/25/19 02/26/21  Malvin JohnsFarah, Brian, MD  losartan (COZAAR) 50 MG tablet Take 1 tablet (50 mg total) by mouth daily. Patient not taking: Reported on 02/26/2021 10/26/19 02/26/21  Malvin JohnsFarah, Brian, MD  omega-3 acid ethyl esters (LOVAZA) 1 g capsule Take 1 capsule (1 g total) by mouth 2 (two) times daily. Patient not taking: Reported on 02/26/2021 10/25/19 02/26/21  Malvin JohnsFarah, Brian, MD    Physical Exam: Vitals:   03/04/21 1232 03/04/21 1300 03/04/21 1330 03/04/21 1400  BP:  116/77 109/82 122/86  Pulse:  86 85 85  Resp:  18 19 17   Temp:      TempSrc:      SpO2:  93% 90% 94%  Weight: 76.3 kg     Height: 5\' 2"  (1.575 m)       Constitutional: Lethargic.  Drowsy. Vitals:   03/04/21 1232 03/04/21 1300 03/04/21 1330 03/04/21 1400  BP:  116/77 109/82 122/86  Pulse:  86 85 85  Resp:  18 19 17   Temp:      TempSrc:      SpO2:  93% 90% 94%  Weight: 76.3 kg     Height: 5\' 2"  (1.575 m)      Eyes: PERRL, lids and conjunctivae normal ENMT: Mucous membranes are dry.  Poor dentition.  Posterior pharynx clear of any exudate or lesions. Neck: normal, supple, no masses, no thyromegaly Respiratory: clear to auscultation bilaterally, no wheezing, no crackles. Normal respiratory effort. No accessory muscle use.  Cardiovascular: Regular rate  and rhythm, no murmurs / rubs / gallops. No extremity edema. 2+ pedal pulses. No carotid bruits.  Abdomen: no tenderness, no masses palpated. No hepatosplenomegaly. Bowel sounds positive.  Musculoskeletal: no clubbing / cyanosis. No joint deformity upper and lower extremities. Good ROM, no contractures. Normal muscle tone.  Skin: no rashes, lesions, ulcers. No induration Neurologic: CN 2-12 grossly intact.  Moving extremities spontaneously.  Psychiatric: Poor judgment and poor insight.  Drowsy but arousable.  Unable to assess mood.    Labs on Admission: I have personally reviewed following labs and imaging studies  CBC: Recent Labs  Lab 02/26/21 1208 02/26/21 1457 02/27/21 0324 03/04/21 1410  WBC 7.5 7.5 4.6 5.3  NEUTROABS 3.8  --   --  3.0  HGB 14.0 12.2 12.7 12.3  HCT 41.9 37.3 38.6 36.6  MCV 88.4 90.5 90.2 89.7  PLT 198 200 79* 202    Basic Metabolic Panel: Recent Labs  Lab 02/26/21 1208 02/26/21 1457 02/27/21 0324 03/04/21 1410  NA 140  --  141 139  K 2.5*  --  3.5 2.4*  CL 97*  --  102 102  CO2 29  --  30 26  GLUCOSE 106*  --  76 83  BUN 24*  --  15 20  CREATININE 1.81* 1.57* 1.14* 1.47*  CALCIUM 8.7*  --  8.0* 7.3*  MG  --  2.7*  --   --     GFR: Estimated Creatinine Clearance: 38.9 mL/min (A) (by C-G formula based on SCr of 1.47 mg/dL (H)).  Liver Function Tests: Recent Labs  Lab 02/26/21 1208 03/04/21 1410  AST 41 112*  ALT 24 46*  ALKPHOS 67 50  BILITOT 1.0 0.6  PROT 7.7 5.7*  ALBUMIN 4.3 2.8*    Urine analysis:    Component Value Date/Time   COLORURINE STRAW (A) 10/23/2019 1451   APPEARANCEUR CLEAR 10/23/2019 1451   LABSPEC 1.005 10/23/2019 1451   PHURINE 6.0 10/23/2019 1451   GLUCOSEU NEGATIVE 10/23/2019 1451   HGBUR NEGATIVE 10/23/2019 1451   BILIRUBINUR NEGATIVE 10/23/2019 1451   KETONESUR NEGATIVE 10/23/2019 1451   PROTEINUR NEGATIVE 10/23/2019 1451   NITRITE NEGATIVE 10/23/2019 1451   LEUKOCYTESUR NEGATIVE 10/23/2019 1451     Radiological Exams on Admission: CT Head Wo Contrast  Result Date: 03/04/2021 CLINICAL DATA:  60 year old female with fall, confusion and altered mental status. EXAM: CT HEAD WITHOUT CONTRAST CT CERVICAL SPINE WITHOUT CONTRAST TECHNIQUE: Multidetector CT imaging of the head and cervical spine was performed following the standard protocol without intravenous contrast. Multiplanar CT image reconstructions of the cervical spine were also generated. COMPARISON:  02/26/2021 and prior studies FINDINGS: CT HEAD FINDINGS Brain: No evidence of acute infarction, hemorrhage, hydrocephalus, extra-axial collection or mass lesion/mass effect. White matter hypodensities are unchanged. Vascular: No hyperdense vessel or unexpected calcification. Skull: Normal. Negative for fracture or focal lesion. Sinuses/Orbits: A LEFT mastoid effusion is again noted. Other: None CT CERVICAL SPINE FINDINGS Alignment: Normal. Skull base and vertebrae: No acute fracture. No primary bone lesion or focal pathologic process. Soft tissues and spinal canal: No prevertebral fluid or swelling. No visible canal hematoma. Disc levels: Mild multilevel degenerative disc disease/spondylosis and moderate multilevel facet arthropathy again noted. Upper chest: No acute abnormality Other: None IMPRESSION: 1. No evidence of acute intracranial abnormality. Chronic small-vessel white matter ischemic changes. 2. No static evidence of acute injury to the cervical spine. Electronically Signed   By: Harmon Pier M.D.   On: 03/04/2021 13:35   CT Cervical Spine Wo Contrast  Result Date: 03/04/2021 CLINICAL DATA:  60 year old female with fall, confusion and altered mental status. EXAM: CT HEAD WITHOUT CONTRAST CT CERVICAL SPINE WITHOUT CONTRAST TECHNIQUE: Multidetector CT imaging of the head and cervical spine was performed following the standard protocol without intravenous contrast. Multiplanar CT image reconstructions of the cervical spine were also generated.  COMPARISON:  02/26/2021 and prior studies FINDINGS: CT HEAD FINDINGS Brain: No evidence of acute infarction, hemorrhage, hydrocephalus, extra-axial collection or mass lesion/mass effect. White matter hypodensities are unchanged. Vascular: No hyperdense vessel or unexpected calcification. Skull: Normal. Negative for fracture or focal lesion. Sinuses/Orbits: A LEFT mastoid effusion is again noted. Other: None CT CERVICAL SPINE FINDINGS Alignment: Normal. Skull base and vertebrae: No acute fracture. No primary bone lesion or focal pathologic process. Soft tissues and spinal canal: No prevertebral fluid or swelling. No visible canal hematoma. Disc levels: Mild multilevel degenerative disc disease/spondylosis and moderate multilevel facet arthropathy again noted. Upper chest: No acute abnormality Other: None IMPRESSION: 1. No evidence of acute intracranial abnormality. Chronic small-vessel white matter ischemic changes. 2. No static evidence of acute injury to the cervical spine. Electronically Signed   By: Harmon Pier M.D.   On: 03/04/2021 13:35   DG Chest Portable 1 View  Result Date: 03/04/2021 CLINICAL DATA:  Altered mental status EXAM: PORTABLE CHEST 1 VIEW COMPARISON:  02/26/2021 FINDINGS: This is a low volume film with mild bibasilar atelectasis. There is no evidence of focal airspace disease, pulmonary edema, suspicious pulmonary nodule/mass, pleural effusion, or pneumothorax. No acute bony abnormalities are identified. IMPRESSION: Low volume film with mild bibasilar atelectasis. Electronically Signed   By: Harmon Pier M.D.   On: 03/04/2021 13:11    EKG: Independently reviewed.  Normal  sinus rhythm with poor R wave progression.  No ischemic signs noted.  Assessment/Plan Principal Problem:   Hypokalemia Active Problems:   COPD (chronic obstructive pulmonary disease) (HCC)   GAD (generalized anxiety disorder)   GERD (gastroesophageal reflux disease)   Bipolar I disorder, current or most recent  episode depressed, with psychotic features (HCC)   PTSD (post-traumatic stress disorder)   MDD (major depressive disorder)   AKI (acute kidney injury) (HCC)   Dehydration   Transaminitis   Auditory hallucination   Visual hallucinations   1 severe hypokalemia -Questionable etiology.  Could likely be secondary to poor oral intake with dehydration in the setting of diuretic of HCTZ.  Unable to fully assess as to whether patient has been having any significant GI losses. -Patient recently hospitalized with severe hypokalemia potassium repleted and patient discharged. -Check a magnesium level. -Oral potassium 40 mEq ordered and pending in the ED to be given. -Kdur 40 mEq p.o. x1 at 8 PM. -Repeat labs in the morning. -Discontinue HCTZ and will not resume on discharge.  Addendum: Was sent to message by pharmacy that patient's bottle of HCTZ found to empty at home however has significant amount of pills and there 3 days prior to admission.  2.  Acute kidney injury -Likely secondary to prerenal azotemia in the setting of diuretics. -Per family patient has had significant poor oral intake with a 20 pound weight loss over the past month. -Patient with hallucinations and concerned someone may be trying to poison her and likely leading to patient's poor oral intake. -Check a UA with cultures and sensitivities, check urine sodium, urine creatinine. -IV fluids. -Follow.  3.  Dehydration -IV fluids.  4.  Acute metabolic encephalopathy -Patient noted to be lethargic and drowsy but arousable.  Patient also per family with worsening auditory and visual hallucinations. -Chest x-ray negative for any acute infiltrates. -Head CT negative for any acute abnormalities. -Check an ammonia level, thiamine level, UA with cultures and sensitivities to rule out UTI. -Hold patient's BuSpar. -Hydrate with IV fluids. -If no significant improvement despite hydration may consider further imaging of the head with  MRI. -Follow.  5.  Bipolar disorder/schizoaffective disorder/auditory and visual hallucinations/MDD -During last hospitalization was noted that patient Cymbalta was discontinued. -Per family patient with a history of auditory and visual hallucinations over the past 3 years however over the past month hallucinations have worsened. -Patient with no signs of infection. -Check a UA. -Patient noted to be on both BuSpar and Klonopin. -Continue Klonopin, hold BuSpar and hydroxyzine. -Resume home regimen Depakote, Cogentin, Geodon. -Consulted psychiatry for further evaluation and management.  6.  COPD -Stable. -No wheezing noted on examination. -Resume home regimen Anoro Ellipta, albuterol MDI.  7.  Tobacco abuse -RN for tobacco cessation as patient at my interview is drowsy. -Continue nicotine patch.  8.  Transaminitis -Check an acute hepatitis panel. -Check a right upper quadrant ultrasound. -IV fluids. -If worsening or with no significant improvement and work-up negative will need GI input.  9. GERD PPI  DVT prophylaxis: Lovenox Code Status:   Full Family Communication:  Updated daughter [name] via telephone. Disposition Plan:   Patient is from:  Home  Anticipated DC to:  TBD  Anticipated DC date:  3 to 4 days  Anticipated DC barriers: Hallucinations  Consults called:  Psychiatry pending Admission status:  Admit to inpatient/telemetry  Severity of Illness: The appropriate patient status for this patient is INPATIENT. Inpatient status is judged to be reasonable and necessary in order  to provide the required intensity of service to ensure the patient's safety. The patient's presenting symptoms, physical exam findings, and initial radiographic and laboratory data in the context of their chronic comorbidities is felt to place them at high risk for further clinical deterioration. Furthermore, it is not anticipated that the patient will be medically stable for discharge from the  hospital within 2 midnights of admission. The following factors support the patient status of inpatient.   " The patient's presenting symptoms include acute metabolic encephalopathy/lethargic, weakness, fall. " The worrisome physical exam findings include auditory and visual hallucinations, lethargy, dehydration. " The initial radiographic and laboratory data are worrisome because of severe hypokalemia, acute kidney injury. " The chronic co-morbidities include bipolar disorder with auditory and visual hallucinations/COPD/GERD.   * I certify that at the point of admission it is my clinical judgment that the patient will require inpatient hospital care spanning beyond 2 midnights from the point of admission due to high intensity of service, high risk for further deterioration and high frequency of surveillance required.*    Ramiro Harvest MD [facility]  How to contact the [facility] Attending or Consulting provider 7A - 7P or covering provider during after hours 7P -7A, for this patient?   Check the care team in [facility] and look for a) attending/consulting [facility] provider listed and b) the [facility] team listed Log into [email or web address] and use [place]'s universal password to access. If you do not have the password, please contact the hospital operator. Locate the [facility] provider you are looking for under [facility] and page to a number that you can be directly reached. If you still have difficulty reaching the provider, please page the [facility] (Director on Call) for the Hospitalists listed on amion for assistance.  03/04/2021, 4:20 PM

## 2021-03-04 NOTE — ED Provider Notes (Signed)
[name]   CSN: [id number] Arrival date & time: 03/04/21  1202     History Chief Complaint  Patient presents with   Altered Mental Status    Michaela Wells is a 61 y.o. female.  HPI  Patient is a 60 year old female with a history of PTSD, bipolar 1 disorder, COPD, who presents to the emergency department due to a fall that occurred prior to arrival.  Patient was admitted on July 4 and discharged on July 5.  She was found to have an AKI as well as hypokalemia and was discharged in stable condition.  She states that earlier today she was going down a set of stairs at her home and tripped and fell.  She is very fatigued and is A&O x1.  She is having difficulty providing any details regarding the fall.  She states that she is hurting in both knees but also notes that she typically has chronic knee pain.  I spoke to the patient's daughter Michaela Wells.  She states that her mother has continued to worsen since her recent admission last week.  She states that they discontinued her Cymbalta but this has had no effect on her behavior.  She has had worsening auditory/visual hallucinations since then.  She states that "she sees a guy with horses outside" she also notes that "she is worried that someone is poisoning her and that her sister is trying to kill her".  She states that she woke up this morning to a "loud boom" and found her mother on the floor and believes that she fell down the stairs.  She states she is paranoid about being poisoned so she has lost nearly 20 pounds from poor p.o. intake in the past month.  Level 5 caveat due to altered mental status    Past Medical History:  Diagnosis Date   Bronchial asthma    Chicken pox    COPD (chronic obstructive pulmonary disease) (HCC)    Depression    Frequent headaches    GERD (gastroesophageal reflux disease)    UTI (lower urinary tract infection)     Patient Active Problem List   Diagnosis  Date Noted   Acute kidney failure (HCC) 02/26/2021   Hypokalemia 02/26/2021   Schizoaffective disorder, bipolar type (HCC) 06/14/2020   Psychosis (HCC) 10/23/2019   MDD (major depressive disorder) 10/19/2019   PTSD (post-traumatic stress disorder) 05/06/2018   Bipolar I disorder, current or most recent episode depressed, with psychotic features (HCC) 05/05/2018   COPD (chronic obstructive pulmonary disease) (HCC) 10/09/2013   GAD (generalized anxiety disorder) 10/09/2013   Smoking trying to quit 10/09/2013   GERD (gastroesophageal reflux disease) 10/09/2013    Past Surgical History:  Procedure Laterality Date   ESOPHAGEAL DILATION     TONSILLECTOMY       OB History   No obstetric history on file.     Family History  Problem Relation Age of Onset   Other Mother        MVA-Deceased [pt was age 34]   COPD Father        Deceased   Heart attack Maternal Grandmother    Stroke Maternal Grandmother    Brain cancer Maternal Grandmother    Arthritis/Rheumatoid Maternal Grandmother    Heart attack Maternal Grandfather    Stroke Maternal Grandfather    Heart disease Maternal Aunt    Stroke Maternal Aunt        #1   Heart attack Maternal Aunt        #  1   Hypertension Maternal Aunt        #1   Arthritis/Rheumatoid Sister    COPD Sister    Heart disease Maternal Uncle    Heart attack Maternal Uncle        x4   Heart disease Brother        #1   Heart attack Brother        #1   Diabetes Brother    Diabetes Maternal Aunt    Mental retardation Maternal Aunt    Healthy Daughter        x2   Drug abuse Daughter        #1   Drug abuse Daughter        #2   Kidney Stones Daughter        #2   Other Son        Agoraphobia    Social History   Tobacco Use   Smoking status: Every Day    Pack years: 0.00   Smokeless tobacco: Never  Substance Use Topics   Alcohol use: Yes   Drug use: Not Currently    Home Medications Prior to Admission medications   Medication Sig  Start Date End Date Taking? Authorizing Provider  albuterol (VENTOLIN HFA) 108 (90 Base) MCG/ACT inhaler Inhale 1 puff into the lungs every 4 (four) hours as needed for wheezing or shortness of breath. Ventolin 01/16/21   Zonia KiefStephens, Amy J, NP  benztropine (COGENTIN) 1 MG tablet TAKE 1 TABLET(1 MG) BY MOUTH TWICE DAILY 02/05/21   Toy CookeyParsons, Brittney E, NP  busPIRone (BUSPAR) 15 MG tablet Take 1 tablet (15 mg total) by mouth 3 (three) times daily. 02/05/21   Shanna CiscoParsons, Brittney E, NP  clonazePAM (KLONOPIN) 0.5 MG tablet TAKE 1 TABLET(0.5 MG) BY MOUTH TWICE DAILY 02/05/21   Toy CookeyParsons, Brittney E, NP  divalproex (DEPAKOTE) 500 MG DR tablet Take 1 tablet (500 mg total) by mouth 2 (two) times daily. 02/05/21   Shanna CiscoParsons, Brittney E, NP  fluticasone (FLONASE) 50 MCG/ACT nasal spray Place 2 sprays into both nostrils daily. 10/15/19   [provider]  hydrochlorothiazide (HYDRODIURIL) 12.5 MG tablet TAKE 1 TABLET(12.5 MG) BY MOUTH DAILY 02/15/21   Rema FendtStephens, Amy J, NP  hydrOXYzine (ATARAX/VISTARIL) 50 MG tablet Take 1 tablet (50 mg total) by mouth 3 (three) times daily. 02/05/21   Shanna CiscoParsons, Brittney E, NP  nicotine (NICODERM CQ - DOSED IN MG/24 HOURS) 21 mg/24hr patch Place 1 patch (21 mg total) onto the skin daily. 02/05/21   Shanna CiscoParsons, Brittney E, NP  umeclidinium-vilanterol (ANORO ELLIPTA) 62.5-25 MCG/INH AEPB Inhale 1 puff into the lungs daily. 01/16/21   Rema FendtStephens, Amy J, NP  ziprasidone (GEODON) 20 MG capsule Take 2 capsules (40 mg total) by mouth 2 (two) times daily with a meal. 02/05/21   Shanna CiscoParsons, Brittney E, NP  carvedilol (COREG) 25 MG tablet Take 1 tablet (25 mg total) by mouth 2 (two) times daily with a meal. Patient not taking: Reported on 02/26/2021 10/25/19 02/26/21  Malvin JohnsFarah, Brian, MD  losartan (COZAAR) 50 MG tablet Take 1 tablet (50 mg total) by mouth daily. Patient not taking: Reported on 02/26/2021 10/26/19 02/26/21  Malvin JohnsFarah, Brian, MD  omega-3 acid ethyl esters (LOVAZA) 1 g capsule Take 1 capsule (1 g total) by mouth 2  (two) times daily. Patient not taking: Reported on 02/26/2021 10/25/19 02/26/21  Malvin JohnsFarah, Brian, MD    Allergies    Penicillins  Review of Systems   Review of Systems  Unable to perform  ROS: Mental status change   Physical Exam Updated Vital Signs BP 122/86   Pulse 85   Temp 98.2 F (36.8 C) (Oral)   Resp 17   Ht 5\' 2"  (1.575 m)   Wt 76.3 kg   SpO2 94%   BMI 30.77 kg/m   Physical Exam Vitals and nursing note reviewed.  Constitutional:      General: She is not in acute distress.    Appearance: Normal appearance. She is not ill-appearing, toxic-appearing or diaphoretic.     Comments: ANO x1.  Fatigued appearing.  Follows commands.  HENT:     Head: Normocephalic and atraumatic.     Right Ear: External ear normal.     Left Ear: External ear normal.     Nose: Nose normal.     Mouth/Throat:     Mouth: Mucous membranes are moist.     Pharynx: Oropharynx is clear. No oropharyngeal exudate or posterior oropharyngeal erythema.  Eyes:     Extraocular Movements: Extraocular movements intact.  Neck:     Comments: No midline C, T, or L-spine tenderness. Cardiovascular:     Rate and Rhythm: Normal rate and regular rhythm.     Pulses: Normal pulses.     Heart sounds: Normal heart sounds. No murmur heard.   No friction rub. No gallop.     Comments: No anterior chest wall pain.  No signs of trauma. Pulmonary:     Effort: Pulmonary effort is normal. No respiratory distress.     Breath sounds: Normal breath sounds. No stridor. No wheezing, rhonchi or rales.  Abdominal:     General: Abdomen is flat.     Palpations: Abdomen is soft.     Tenderness: There is no abdominal tenderness.     Comments: Abdomen is soft and nontender.  No signs of trauma.  Musculoskeletal:        General: Normal range of motion.     Cervical back: Normal range of motion and neck supple. No tenderness.     Comments: Full range of motion of the bilateral hips, knees, and ankles.  Skin:    General: Skin is warm  and dry.  Neurological:     General: No focal deficit present.     Comments: A&O x1.  Moving all 4 extremities with ease.  Follows commands.  Psychiatric:        Attention and Perception: She is inattentive.        Mood and Affect: Mood is depressed.        Speech: Speech is delayed and slurred.        Behavior: Behavior normal.   ED Results / Procedures / Treatments   Labs (all labs ordered are listed, but only abnormal results are displayed) Labs Reviewed  COMPREHENSIVE METABOLIC PANEL - Abnormal; Notable for the following components:      Result Value   Potassium 2.4 (*)    Creatinine, Ser 1.47 (*)    Calcium 7.3 (*)    Total Protein 5.7 (*)    Albumin 2.8 (*)    AST 112 (*)    ALT 46 (*)    GFR, Estimated 41 (*)    All other components within normal limits  RESP PANEL BY RT-PCR (FLU A&B, COVID) ARPGX2  CBC WITH DIFFERENTIAL/PLATELET  ETHANOL  URINALYSIS, ROUTINE W REFLEX MICROSCOPIC  RAPID URINE DRUG SCREEN, HOSP PERFORMED  MAGNESIUM   EKG None  Radiology CT Head Wo Contrast  Result Date: 03/04/2021 CLINICAL DATA:  60 year old female  with fall, confusion and altered mental status. EXAM: CT HEAD WITHOUT CONTRAST CT CERVICAL SPINE WITHOUT CONTRAST TECHNIQUE: Multidetector CT imaging of the head and cervical spine was performed following the standard protocol without intravenous contrast. Multiplanar CT image reconstructions of the cervical spine were also generated. COMPARISON:  02/26/2021 and prior studies FINDINGS: CT HEAD FINDINGS Brain: No evidence of acute infarction, hemorrhage, hydrocephalus, extra-axial collection or mass lesion/mass effect. White matter hypodensities are unchanged. Vascular: No hyperdense vessel or unexpected calcification. Skull: Normal. Negative for fracture or focal lesion. Sinuses/Orbits: A LEFT mastoid effusion is again noted. Other: None CT CERVICAL SPINE FINDINGS Alignment: Normal. Skull base and vertebrae: No acute fracture. No primary bone  lesion or focal pathologic process. Soft tissues and spinal canal: No prevertebral fluid or swelling. No visible canal hematoma. Disc levels: Mild multilevel degenerative disc disease/spondylosis and moderate multilevel facet arthropathy again noted. Upper chest: No acute abnormality Other: None IMPRESSION: 1. No evidence of acute intracranial abnormality. Chronic small-vessel white matter ischemic changes. 2. No static evidence of acute injury to the cervical spine. Electronically Signed   By: Harmon Pier M.D.   On: 03/04/2021 13:35   CT Cervical Spine Wo Contrast  Result Date: 03/04/2021 CLINICAL DATA:  60 year old female with fall, confusion and altered mental status. EXAM: CT HEAD WITHOUT CONTRAST CT CERVICAL SPINE WITHOUT CONTRAST TECHNIQUE: Multidetector CT imaging of the head and cervical spine was performed following the standard protocol without intravenous contrast. Multiplanar CT image reconstructions of the cervical spine were also generated. COMPARISON:  02/26/2021 and prior studies FINDINGS: CT HEAD FINDINGS Brain: No evidence of acute infarction, hemorrhage, hydrocephalus, extra-axial collection or mass lesion/mass effect. White matter hypodensities are unchanged. Vascular: No hyperdense vessel or unexpected calcification. Skull: Normal. Negative for fracture or focal lesion. Sinuses/Orbits: A LEFT mastoid effusion is again noted. Other: None CT CERVICAL SPINE FINDINGS Alignment: Normal. Skull base and vertebrae: No acute fracture. No primary bone lesion or focal pathologic process. Soft tissues and spinal canal: No prevertebral fluid or swelling. No visible canal hematoma. Disc levels: Mild multilevel degenerative disc disease/spondylosis and moderate multilevel facet arthropathy again noted. Upper chest: No acute abnormality Other: None IMPRESSION: 1. No evidence of acute intracranial abnormality. Chronic small-vessel white matter ischemic changes. 2. No static evidence of acute injury to the  cervical spine. Electronically Signed   By: Harmon Pier M.D.   On: 03/04/2021 13:35   DG Chest Portable 1 View  Result Date: 03/04/2021 CLINICAL DATA:  Altered mental status EXAM: PORTABLE CHEST 1 VIEW COMPARISON:  02/26/2021 FINDINGS: This is a low volume film with mild bibasilar atelectasis. There is no evidence of focal airspace disease, pulmonary edema, suspicious pulmonary nodule/mass, pleural effusion, or pneumothorax. No acute bony abnormalities are identified. IMPRESSION: Low volume film with mild bibasilar atelectasis. Electronically Signed   By: Harmon Pier M.D.   On: 03/04/2021 13:11    Procedures Procedures   Medications Ordered in ED Medications  potassium chloride SA (KLOR-CON) CR tablet 40 mEq (has no administration in time range)  potassium chloride 10 mEq in 100 mL IVPB (has no administration in time range)  sodium chloride 0.9 % bolus 1,000 mL (1,000 mLs Intravenous New Bag/Given 03/04/21 1409)   ED Course  I have reviewed the triage vital signs and the nursing notes.  Pertinent labs & imaging results that were available during my care of the patient were reviewed by me and considered in my medical decision making (see chart for details).    MDM Rules/Calculators/A&P  Pt is a 60 y.o. female who presents to the ED due to a fall as well as AMS.  Labs: CBC without abnormalities. CMP with a potassium of 2.4, creatinine of 1.47, calcium of 7.3, total protein of 4.7, albumin of 2.8, AST of 112, ALT of 46, GFR 41. Ethanol less than 10. Magnesium pending. UA pending. UDS pending. Respiratory panel pending.  Imaging: Chest x-ray is a low volume film with mild bibasilar atelectasis. CT scan of the head and cervical spine without contrast shows no evidence of acute intracranial abnormality.  Chronic small vessel white matter ischemic changes.  No static evidence of acute injury to the cervical spine.  I, Placido Sou, PA-C, personally reviewed  and evaluated these images and lab results as part of my medical decision-making.  Patient recently admitted last week for AKI as well as hypokalemia.  She was discharged in stable condition.  They felt that this was possibly due to her Cymbalta use and the requested that she discontinue this.  I spoke to her daughter who states that she is continue to have visual and auditory hallucinations have been worsening since her discharge.  This is been an issue for the past 3 to 4 years.  She states that she is scared that she is being poisoned so she has also had poor p.o. intake for the past month and has lost nearly 20 pounds.  Unfortunately it appears the patient is once again hypokalemic at 2.4.  Patient started on Klor-Con as well as IV potassium.  Mildly elevated creatinine of 1.47 with a GFR of 41.  Possible AKI.  We will check a magnesium level.  Once again likely prerenal due to poor p.o. intake.  Given patient's age and complex medical history feel that she will likely require admission for further work-up.  Will discuss with the medicine team.  Note: Portions of this report may have been transcribed using voice recognition software. Every effort was made to ensure accuracy; however, inadvertent computerized transcription errors may be present.   Final Clinical Impression(s) / ED Diagnoses Final diagnoses:  Altered mental status, unspecified altered mental status type  Hypokalemia   Rx / DC Orders ED Discharge Orders     None        Placido Sou, PA-C 03/04/21 1503    Bethann Berkshire, MD 03/04/21 1655

## 2021-03-04 NOTE — Progress Notes (Signed)
Pt is asleep, oxygen sat checked at 86% RA; 2L of Oxygen via  given, O2 sat at 92-94%. Will continue to monitor.

## 2021-03-04 NOTE — ED Triage Notes (Signed)
Pt BIB [facility] from home for AMS. D/c from [facility] on 7/5. Pt reported to have fallen down 6 steps. No obvious injuries or complaints. Daughter reports pt has been altered since discharge. Uncertain about whether pt has been taking meds appropriately. EMS reports possible visual hallucinations. 20g LH, NS. Limited PO intake lately.  BP 92/60 HR 90 SpO2 96% RA CBG 130 RR 18

## 2021-03-04 NOTE — Plan of Care (Signed)
  Problem: Clinical Measurements: Goal: Respiratory complications will improve Outcome: Progressing   Problem: Clinical Measurements: Goal: Cardiovascular complication will be avoided Outcome: Progressing   Problem: Activity: Goal: Risk for activity intolerance will decrease Outcome: Progressing   Problem: Coping: Goal: Level of anxiety will decrease Outcome: Progressing   Problem: Elimination: Goal: Will not experience complications related to urinary retention Outcome: Progressing   Problem: Pain Managment: Goal: General experience of comfort will improve Outcome: Progressing   Problem: Safety: Goal: Ability to remain free from injury will improve Outcome: Progressing   Problem: Skin Integrity: Goal: Risk for impaired skin integrity will decrease Outcome: Progressing

## 2021-03-04 NOTE — ED Notes (Signed)
PA and primary nurse made aware critical potassium 2.4.

## 2021-03-05 ENCOUNTER — Inpatient Hospital Stay (HOSPITAL_COMMUNITY): Payer: Medicaid Other

## 2021-03-05 ENCOUNTER — Other Ambulatory Visit: Payer: Self-pay

## 2021-03-05 DIAGNOSIS — F4310 Post-traumatic stress disorder, unspecified: Secondary | ICD-10-CM

## 2021-03-05 LAB — COMPREHENSIVE METABOLIC PANEL
ALT: 42 U/L (ref 0–44)
AST: 92 U/L — ABNORMAL HIGH (ref 15–41)
Albumin: 3.2 g/dL — ABNORMAL LOW (ref 3.5–5.0)
Alkaline Phosphatase: 58 U/L (ref 38–126)
Anion gap: 10 (ref 5–15)
BUN: 15 mg/dL (ref 6–20)
CO2: 25 mmol/L (ref 22–32)
Calcium: 7.5 mg/dL — ABNORMAL LOW (ref 8.9–10.3)
Chloride: 104 mmol/L (ref 98–111)
Creatinine, Ser: 1.12 mg/dL — ABNORMAL HIGH (ref 0.44–1.00)
GFR, Estimated: 56 mL/min — ABNORMAL LOW (ref >60)
Glucose, Bld: 58 mg/dL — ABNORMAL LOW (ref 70–99)
Potassium: 3.4 mmol/L — ABNORMAL LOW (ref 3.5–5.1)
Sodium: 139 mmol/L (ref 135–145)
Total Bilirubin: 0.5 mg/dL (ref 0.3–1.2)
Total Protein: 6.1 g/dL — ABNORMAL LOW (ref 6.5–8.1)

## 2021-03-05 LAB — HEPATITIS PANEL, ACUTE
HCV Ab: NONREACTIVE
Hep A IgM: NONREACTIVE
Hep B C IgM: NONREACTIVE
Hepatitis B Surface Ag: NONREACTIVE

## 2021-03-05 LAB — URINALYSIS, ROUTINE W REFLEX MICROSCOPIC
Bilirubin Urine: NEGATIVE
Glucose, UA: NEGATIVE mg/dL
Hgb urine dipstick: NEGATIVE
Ketones, ur: 20 mg/dL — AB
Leukocytes,Ua: NEGATIVE
Nitrite: NEGATIVE
Protein, ur: NEGATIVE mg/dL
Specific Gravity, Urine: 1.01 (ref 1.005–1.030)
pH: 5 (ref 5.0–8.0)

## 2021-03-05 LAB — CBC
HCT: 37.6 % (ref 36.0–46.0)
Hemoglobin: 12 g/dL (ref 12.0–15.0)
MCH: 29.6 pg (ref 26.0–34.0)
MCHC: 31.9 g/dL (ref 30.0–36.0)
MCV: 92.8 fL (ref 80.0–100.0)
Platelets: 200 10*3/uL (ref 150–400)
RBC: 4.05 MIL/uL (ref 3.87–5.11)
RDW: 13.8 % (ref 11.5–15.5)
WBC: 3.5 10*3/uL — ABNORMAL LOW (ref 4.0–10.5)
nRBC: 0 % (ref 0.0–0.2)

## 2021-03-05 LAB — RAPID URINE DRUG SCREEN, HOSP PERFORMED
Amphetamines: NOT DETECTED
Barbiturates: NOT DETECTED
Benzodiazepines: NOT DETECTED
Cocaine: NOT DETECTED
Opiates: NOT DETECTED
Tetrahydrocannabinol: NOT DETECTED

## 2021-03-05 LAB — GLUCOSE, CAPILLARY
Glucose-Capillary: 49 mg/dL — ABNORMAL LOW (ref 70–99)
Glucose-Capillary: 50 mg/dL — ABNORMAL LOW (ref 70–99)
Glucose-Capillary: 66 mg/dL — ABNORMAL LOW (ref 70–99)
Glucose-Capillary: 110 mg/dL — ABNORMAL HIGH (ref 70–99)
Glucose-Capillary: 108 mg/dL — ABNORMAL HIGH (ref 70–99)
Glucose-Capillary: 106 mg/dL — ABNORMAL HIGH (ref 70–99)

## 2021-03-05 LAB — CREATININE, URINE, RANDOM: Creatinine, Urine: 101.49 mg/dL

## 2021-03-05 LAB — PHOSPHORUS: Phosphorus: 2.8 mg/dL (ref 2.5–4.6)

## 2021-03-05 LAB — SODIUM, URINE, RANDOM: Sodium, Ur: 59 mmol/L

## 2021-03-05 LAB — MAGNESIUM: Magnesium: 2.2 mg/dL (ref 1.7–2.4)

## 2021-03-05 IMAGING — US US ABDOMEN COMPLETE
1 series · 15 of 25 positions shown · non-contrast
Comparison: None.

CLINICAL DATA: Transaminitis

EXAM:
ABDOMEN ULTRASOUND COMPLETE

[Series 1: us abdomen complete mc & wl · 15 of 73 slices shown]
[im 1/73]
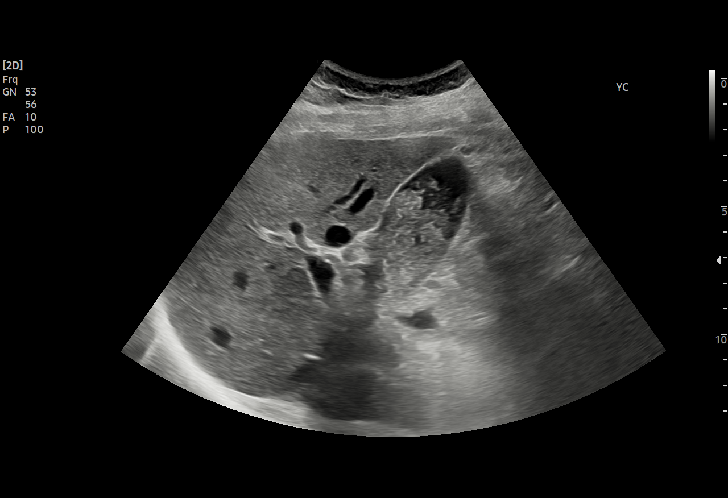
[im 7/73]
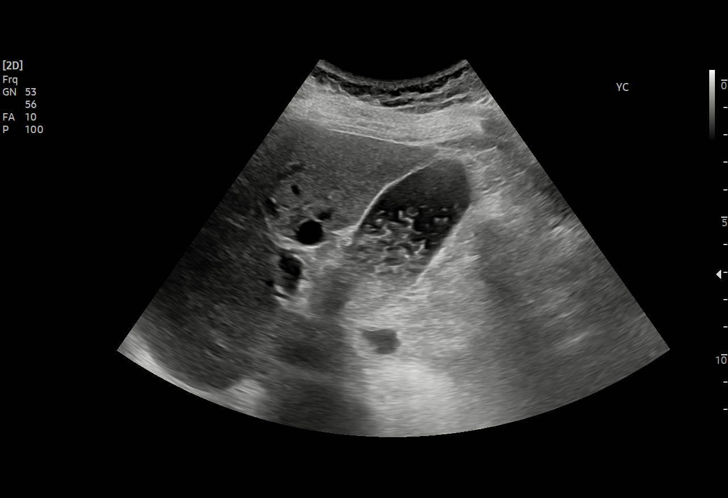
[im 13/73]
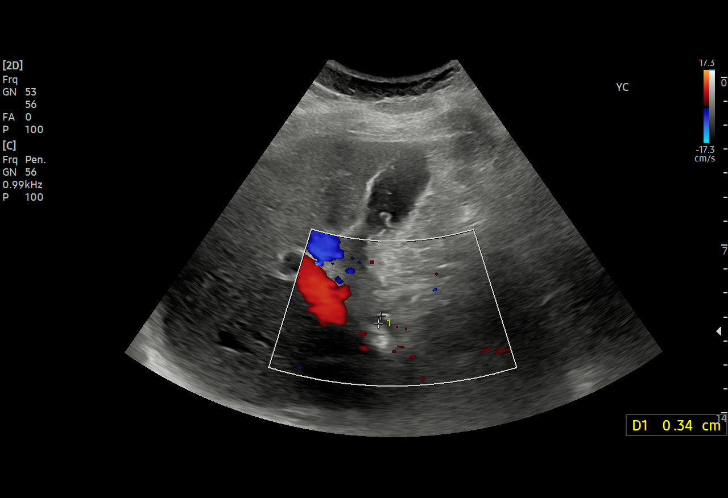
[im 16/73]
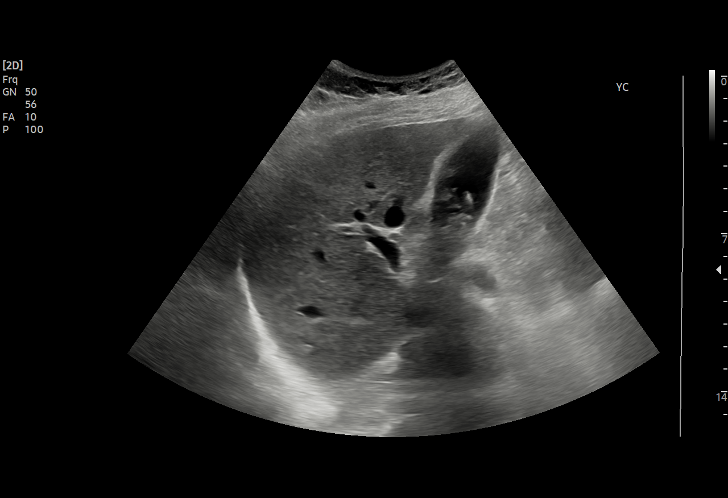
[im 22/73]
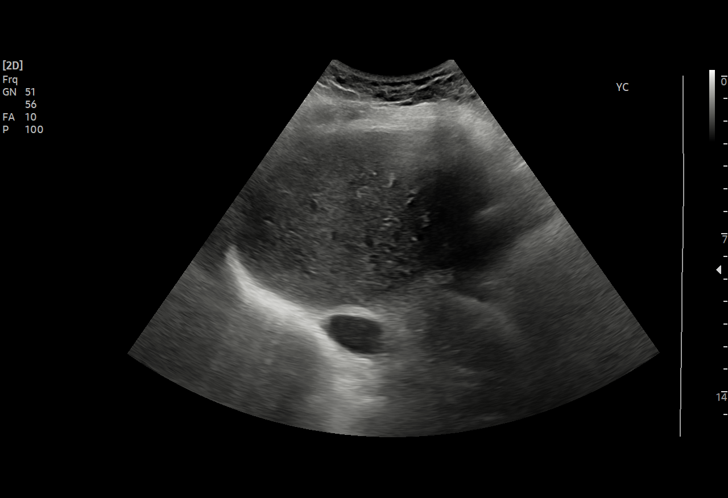
[im 28/73]
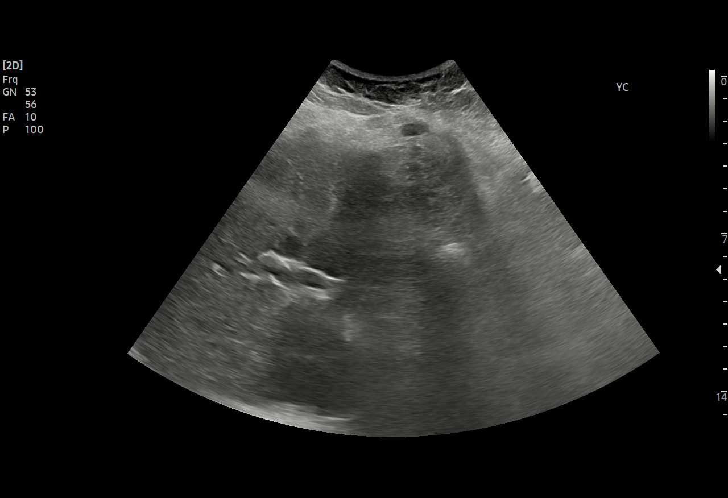
[im 31/73]
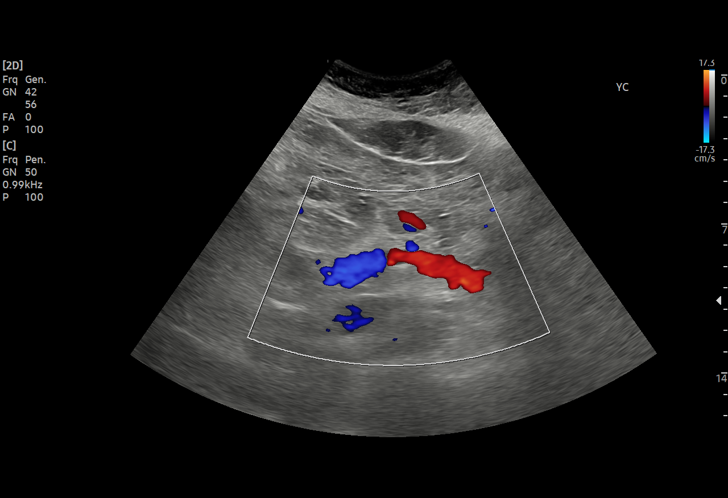
[im 37/73]
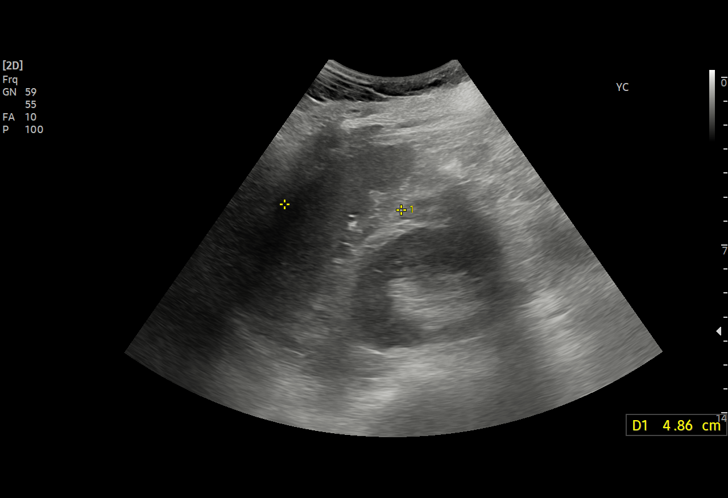
[im 43/73]
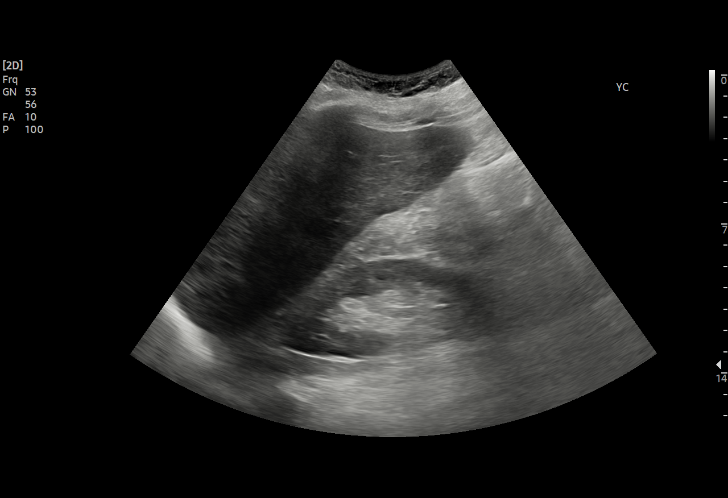
[im 46/73]
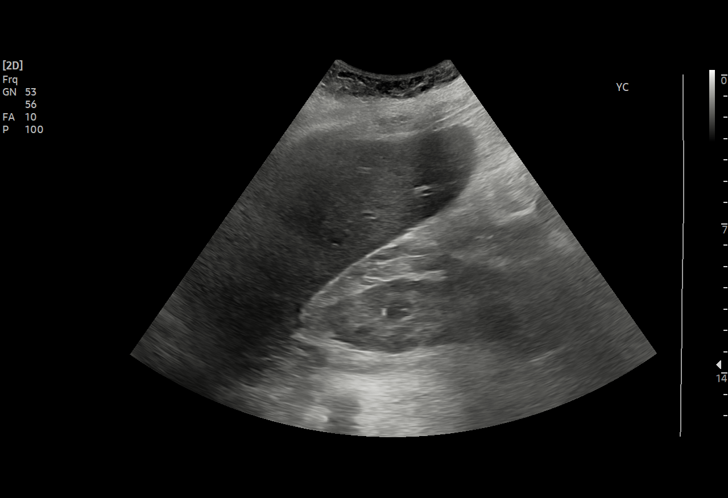
[im 52/73]
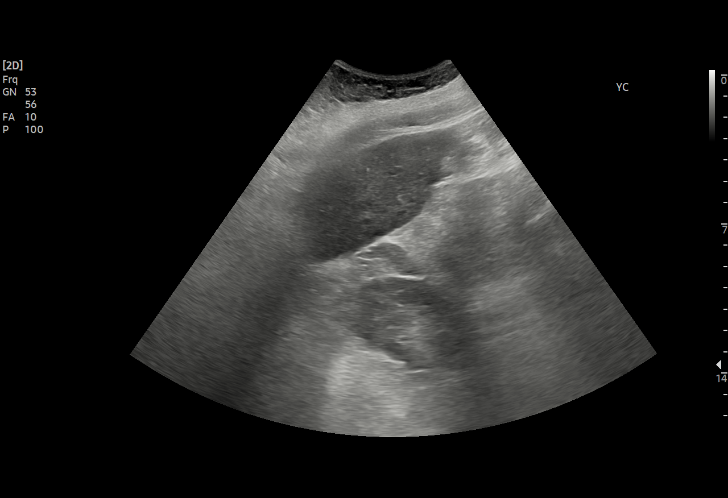
[im 58/73]
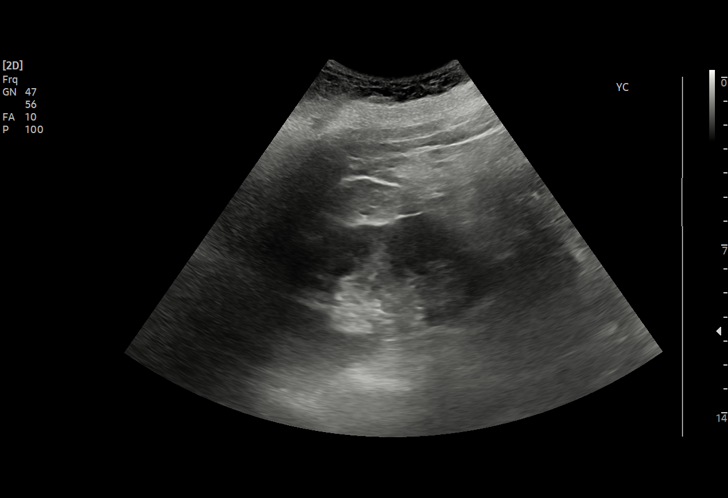
[im 61/73]
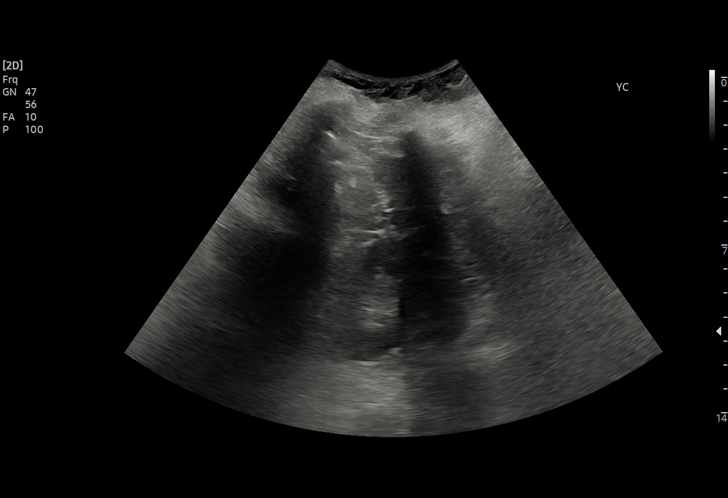
[im 67/73]
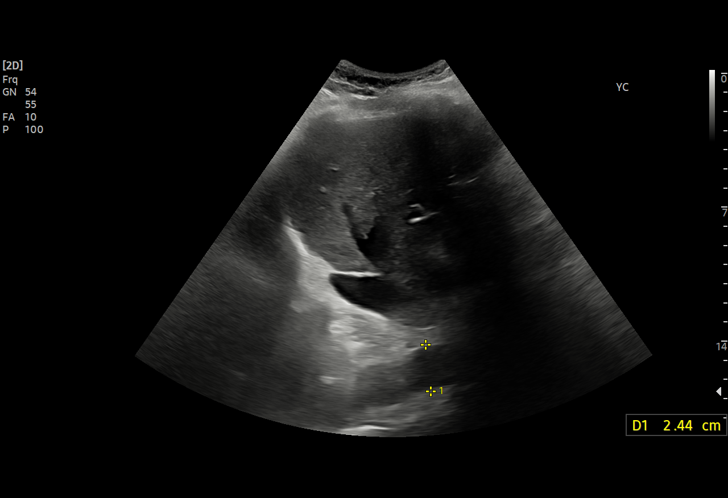
[im 73/73]
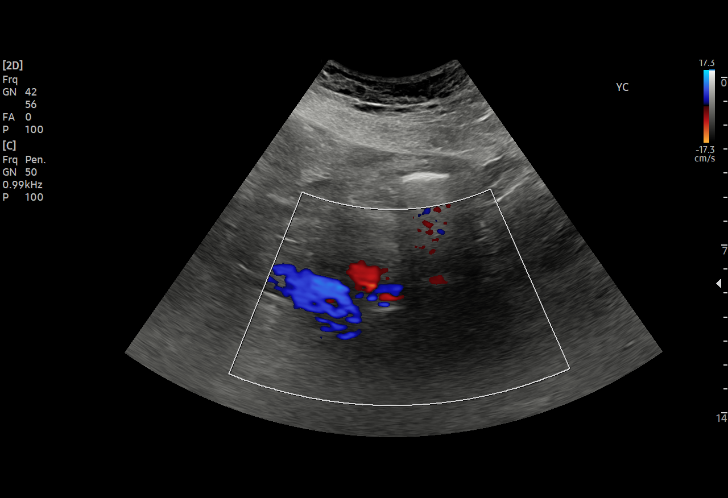

[15 of 25 positions shown; findings below may reference images not displayed]

FINDINGS: Gallbladder: Large amount of sludge within the gallbladder. Probable
small stones also noted in the gallbladder. No wall thickening or
sonographic Murphy's sign.

Common bile duct: Diameter: Normal caliber, 3 mm

Liver: No focal lesion identified. Within normal limits in
parenchymal echogenicity. Portal vein is patent on color Doppler
imaging with normal direction of blood flow towards the liver.

IVC: No abnormality visualized.

Pancreas: Visualized portion unremarkable.

Spleen: Size and appearance within normal limits.

Right Kidney: Length: 9.9 cm. Echogenicity within normal limits. No
mass or hydronephrosis visualized.

Left Kidney: Length: 9.8 cm. Echogenicity within normal limits. No
mass or hydronephrosis visualized.

Abdominal aorta: No aneurysm visualized.

Other findings: None.
IMPRESSION: Sludge and stones noted within the gallbladder. No sonographic
evidence of acute cholecystitis.

## 2021-03-05 MED ORDER — ZIPRASIDONE HCL 20 MG PO CAPS
20.0000 mg | ORAL_CAPSULE | Freq: Two times a day (BID) | ORAL | Status: DC
  Administered 2021-03-05 – 2021-03-09 (×8): 20 mg via ORAL
  Filled 2021-03-05 (×8): qty 1

## 2021-03-05 MED ORDER — ENSURE ENLIVE PO LIQD
237.0000 mL | Freq: Two times a day (BID) | ORAL | Status: DC
  Administered 2021-03-05 – 2021-03-13 (×10): 237 mL via ORAL

## 2021-03-05 MED ORDER — OLANZAPINE 5 MG PO TBDP
2.5000 mg | ORAL_TABLET | Freq: Two times a day (BID) | ORAL | Status: DC
  Administered 2021-03-05 – 2021-03-09 (×9): 2.5 mg via ORAL
  Filled 2021-03-05 (×9): qty 0.5

## 2021-03-05 MED ORDER — POTASSIUM CHLORIDE CRYS ER 20 MEQ PO TBCR
40.0000 meq | EXTENDED_RELEASE_TABLET | Freq: Once | ORAL | Status: AC
  Administered 2021-03-05: 40 meq via ORAL
  Filled 2021-03-05: qty 2

## 2021-03-05 MED ORDER — GLUCOSE 40 % PO GEL
ORAL | Status: AC
  Administered 2021-03-05: 37.5 g
  Filled 2021-03-05: qty 1.21

## 2021-03-05 MED ORDER — DEXTROSE-NACL 5-0.9 % IV SOLN: INTRAVENOUS | Status: AC

## 2021-03-05 NOTE — Progress Notes (Signed)
PROGRESS NOTE    Michaela Wells  ZOX:[id number] DOB: [birth year] DOA: 03/04/2021 PCP: Rema FendtStephens, Amy J, NP    Chief Complaint  Patient presents with   Altered Mental Status    Brief Narrative:  Patient 60 year old female history of bipolar 1 disorder, PTSD, schizoaffective disorder, COPD recently hospitalized (7/4-7/5) for hypokalemia and acute kidney injury during the hospitalization noted to have hallucinations who presents back to the ED after a fall.  It is noted that patient is having both auditory and visual hallucinations which have worsened recently per daughter with some associated decreased oral intake and a 20 pound weight loss over the past month.  Infectious work-up done negative.  Comprehensive metabolic profile noted patient to be hypokalemic in acute kidney injury.  SARS coronavirus 2 PCR done negative.  Patient admitted for electrolyte abnormalities, acute kidney injury, auditory and visual hallucinations.  Psychiatry consulted.   Assessment & Plan:   Principal Problem:   Hypokalemia Active Problems:   COPD (chronic obstructive pulmonary disease) (HCC)   GAD (generalized anxiety disorder)   GERD (gastroesophageal reflux disease)   Bipolar I disorder, current or most recent episode depressed, with psychotic features (HCC)   PTSD (post-traumatic stress disorder)   MDD (major depressive disorder)   AKI (acute kidney injury) (HCC)   Dehydration   Transaminitis   Auditory hallucination   Visual hallucinations   Acute metabolic encephalopathy  1 severe hypokalemia -Likely secondary to poor oral intake with dehydration in the setting of diuretics of HCTZ.   Per pharmacy it was noted that patient's bottle of HCTZ was found to be empty at home per family however noted to have a significant amount of pills and that 3 days prior to admission.   -Patient recently hospitalized with severe hypokalemia, potassium repleted and patient discharged. -Magnesium level at 2.3.    -Potassium repleted with potassium currently at 3.4 from 2.4 on admission.   -K-Dur 40 mEq p.o. x1.   -HCTZ discontinued and will not be resumed on discharge.    Addendum: Was sent to message by pharmacy that patient's bottle of HCTZ found to empty at home however has significant amount of pills and there 3 days prior to admission.  2.  Acute kidney injury -Likely secondary to prerenal azotemia in the setting of diuretics. -Per family patient has had significant poor oral intake with a 20 pound weight loss over the past month. -Patient with hallucinations and concerned someone may be trying to poison her and likely leading to patient's poor oral intake. -Urinalysis bland.   -Renal function improving with hydration.   -Continue IV fluids.  -Follow.  3.  Dehydration -Continue IVF.   4.  Acute metabolic encephalopathy -Patient noted to be lethargic and drowsy but arousable on presentation.  Patient also per family with worsening auditory and visual hallucinations. -Patient alert today answering some questions and following some commands. -Chest x-ray negative for any acute infiltrates. -Head CT negative for any acute abnormalities. -Urinalysis bland. -Ammonia level within normal limits. -Continue to hold BuSpar, hydroxyzine. -Continue home regimen Klonopin, IV fluids. -Patient with no focal neurological deficits. -Psychiatric consultation pending.  5.  Bipolar disorder/schizoaffective disorder/auditory and visual hallucinations/MDD -During last hospitalization was noted that patient Cymbalta was discontinued. -Per family patient with a history of auditory and visual hallucinations over the past 3 years however over the past month hallucinations have worsened. -Patient with no signs of infection. -Urinalysis nitrite negative, leukocytes negative.   -Patient noted to be on both BuSpar and Klonopin. -Continue  to hold BuSpar and hydroxyzine.   -Continue Klonopin, Depakote, Cogentin,  Geodon.   -Psychiatry consultation pending.    6.  COPD -Stable. -Continue home regimen of Anoro Ellipta, albuterol MDI.  7.  Tobacco abuse -RN consult for tobacco cessation.   -Nicotine patch.   8.  Transaminitis -Likely secondary to dehydration.   -Acute hepatitis panel negative.   -Renal ultrasound consistent with cholelithiasis without cholecystitis.   -Patient currently asymptomatic without any abdominal pain.   -Continue IV fluids, supportive care.   -Outpatient follow-up.    9. GERD Continue PPI   DVT prophylaxis: Lovenox Code Status: Full Family Communication: No family at bedside. Disposition:   Status is: Inpatient  Remains inpatient appropriate because:Inpatient level of care appropriate due to severity of illness  Dispo: The patient is from: Home              Anticipated d/c is to:  TBD              Patient currently is not medically stable to d/c.   Difficult to place patient No       Consultants:  Psychiatry pending  Procedures:  CT head CT C-spine 03/04/2021 Chest x-ray 03/04/2021 Abdominal ultrasound 03/05/2021    Antimicrobials:  None   Subjective: Compression laying in bed alert to self and place but not time.  Thinks is 1962.  Denies any chest pain, no abdominal pain.  Able to tell me she tripped and fell down the stairs prior to her hospitalization.  Per RN patient noted with ongoing auditory and visual hallucinations.  Objective: Vitals:   03/04/21 1919 03/04/21 1920 03/04/21 2224 03/05/21 0637  BP:   97/62 120/62  Pulse:   70 71  Resp:   14 19  Temp:   (!) 97.5 F (36.4 C) 97.8 F (36.6 C)  TempSrc:   Oral Oral  SpO2: (!) 86% 92% 98% 95%  Weight:      Height:        Intake/Output Summary (Last 24 hours) at 03/05/2021 1142 Last data filed at 03/05/2021 0900 Gross per 24 hour  Intake 3884.95 ml  Output 700 ml  Net 3184.95 ml   Filed Weights   03/04/21 1232 03/04/21 1814  Weight: 76.3 kg 78.1 kg     Examination:  General exam: Appears calm and comfortable  Respiratory system: Clear to auscultation. Respiratory effort normal. Cardiovascular system: S1 & S2 heard, RRR. No JVD, murmurs, rubs, gallops or clicks. No pedal edema. Gastrointestinal system: Abdomen is nondistended, soft and nontender. No organomegaly or masses felt. Normal bowel sounds heard. Central nervous system: Alert and oriented x2. No focal neurological deficits. Extremities: Symmetric 5 x 5 power. Skin: No rashes, lesions or ulcers Psychiatry: Judgement and insight appear poor. Mood & affect appropriate.     Data Reviewed: I have personally reviewed following labs and imaging studies  CBC: Recent Labs  Lab 02/26/21 1208 02/26/21 1457 02/27/21 0324 03/04/21 1410 03/05/21 0452  WBC 7.5 7.5 4.6 5.3 3.5*  NEUTROABS 3.8  --   --  3.0  --   HGB 14.0 12.2 12.7 12.3 12.0  HCT 41.9 37.3 38.6 36.6 37.6  MCV 88.4 90.5 90.2 89.7 92.8  PLT 198 200 79* 202 200    Basic Metabolic Panel: Recent Labs  Lab 02/26/21 1208 02/26/21 1457 02/27/21 0324 03/04/21 1410 03/04/21 1843 03/05/21 0452  NA 140  --  141 139  --  139  K 2.5*  --  3.5 2.4*  --  3.4*  CL 97*  --  102 102  --  104  CO2 29  --  30 26  --  25  GLUCOSE 106*  --  76 83  --  58*  BUN 24*  --  15 20  --  15  CREATININE 1.81* 1.57* 1.14* 1.47*  --  1.12*  CALCIUM 8.7*  --  8.0* 7.3*  --  7.5*  MG  --  2.7*  --   --  2.3 2.2  PHOS  --   --   --   --   --  2.8    GFR: Estimated Creatinine Clearance: 51.7 mL/min (A) (by C-G formula based on SCr of 1.12 mg/dL (H)).  Liver Function Tests: Recent Labs  Lab 02/26/21 1208 03/04/21 1410 03/05/21 0452  AST 41 112* 92*  ALT 24 46* 42  ALKPHOS 67 50 58  BILITOT 1.0 0.6 0.5  PROT 7.7 5.7* 6.1*  ALBUMIN 4.3 2.8* 3.2*    CBG: Recent Labs  Lab 03/05/21 0743  GLUCAP 110*     Recent Results (from the past 240 hour(s))  SARS CORONAVIRUS 2 (TAT 6-24 HRS) Nasopharyngeal Nasopharyngeal Swab      Status: None   Collection Time: 02/26/21  2:55 PM   Specimen: Nasopharyngeal Swab  Result Value Ref Range Status   SARS Coronavirus 2 NEGATIVE NEGATIVE Final    Comment: (NOTE) SARS-CoV-2 target nucleic acids are NOT DETECTED.  The SARS-CoV-2 RNA is generally detectable in upper and lower respiratory specimens during the acute phase of infection. Negative results do not preclude SARS-CoV-2 infection, do not rule out co-infections with other pathogens, and should not be used as the sole basis for treatment or other patient management decisions. Negative results must be combined with clinical observations, patient history, and epidemiological information. The expected result is Negative.  Fact Sheet for Patients: HairSlick.no  Fact Sheet for Healthcare Providers: quierodirigir.com  This test is not yet approved or cleared by the [place] FDA and  has been authorized for detection and/or diagnosis of SARS-CoV-2 by FDA under an Emergency Use Authorization (EUA). This EUA will remain  in effect (meaning this test can be used) for the duration of the COVID-19 declaration under Se ction 564(b)(1) of the Act, 21 U.S.C. section 360bbb-3(b)(1), unless the authorization is terminated or revoked sooner.  Performed at [facility], [address], [place], [place] [address]   Resp Panel by RT-PCR (Flu A&B, Covid) Nasopharyngeal Swab     Status: None   Collection Time: 03/04/21  2:28 PM   Specimen: Nasopharyngeal Swab; Nasopharyngeal(NP) swabs in vial transport medium  Result Value Ref Range Status   SARS Coronavirus 2 by RT PCR NEGATIVE NEGATIVE Final    Comment: (NOTE) SARS-CoV-2 target nucleic acids are NOT DETECTED.  The SARS-CoV-2 RNA is generally detectable in upper respiratory specimens during the acute phase of infection. The lowest concentration of SARS-CoV-2 viral copies this assay can detect is 138 copies/mL. A  negative result does not preclude SARS-Cov-2 infection and should not be used as the sole basis for treatment or other patient management decisions. A negative result may occur with  improper specimen collection/handling, submission of specimen other than nasopharyngeal swab, presence of viral mutation(s) within the areas targeted by this assay, and inadequate number of viral copies(<138 copies/mL). A negative result must be combined with clinical observations, patient history, and epidemiological information. The expected result is Negative.  Fact Sheet for Patients:  BloggerCourse.com  Fact Sheet for  Healthcare Providers:  SeriousBroker.it  This test is no t yet approved or cleared by the [place] and  has been authorized for detection and/or diagnosis of SARS-CoV-2 by FDA under an Emergency Use Authorization (EUA). This EUA will remain  in effect (meaning this test can be used) for the duration of the COVID-19 declaration under Section 564(b)(1) of the Act, 21 U.S.C.section 360bbb-3(b)(1), unless the authorization is terminated  or revoked sooner.       Influenza A by PCR NEGATIVE NEGATIVE Final   Influenza B by PCR NEGATIVE NEGATIVE Final    Comment: (NOTE) The Xpert Xpress SARS-CoV-2/FLU/RSV plus assay is intended as an aid in the diagnosis of influenza from Nasopharyngeal swab specimens and should not be used as a sole basis for treatment. Nasal washings and aspirates are unacceptable for Xpert Xpress SARS-CoV-2/FLU/RSV testing.  Fact Sheet for Patients: BloggerCourse.com  Fact Sheet for Healthcare Providers: SeriousBroker.it  This test is not yet approved or cleared by the [place] FDA and has been authorized for detection and/or diagnosis of SARS-CoV-2 by FDA under an Emergency Use Authorization (EUA). This EUA will remain in effect (meaning this test can  be used) for the duration of the COVID-19 declaration under Section 564(b)(1) of the Act, 21 U.S.C. section 360bbb-3(b)(1), unless the authorization is terminated or revoked.  Performed at [facility], [address], [place], [place] [address]          Radiology Studies: CT Head Wo Contrast  Result Date: 03/04/2021 CLINICAL DATA:  60 year old female with fall, confusion and altered mental status. EXAM: CT HEAD WITHOUT CONTRAST CT CERVICAL SPINE WITHOUT CONTRAST TECHNIQUE: Multidetector CT imaging of the head and cervical spine was performed following the standard protocol without intravenous contrast. Multiplanar CT image reconstructions of the cervical spine were also generated. COMPARISON:  02/26/2021 and prior studies FINDINGS: CT HEAD FINDINGS Brain: No evidence of acute infarction, hemorrhage, hydrocephalus, extra-axial collection or mass lesion/mass effect. White matter hypodensities are unchanged. Vascular: No hyperdense vessel or unexpected calcification. Skull: Normal. Negative for fracture or focal lesion. Sinuses/Orbits: A LEFT mastoid effusion is again noted. Other: None CT CERVICAL SPINE FINDINGS Alignment: Normal. Skull base and vertebrae: No acute fracture. No primary bone lesion or focal pathologic process. Soft tissues and spinal canal: No prevertebral fluid or swelling. No visible canal hematoma. Disc levels: Mild multilevel degenerative disc disease/spondylosis and moderate multilevel facet arthropathy again noted. Upper chest: No acute abnormality Other: None IMPRESSION: 1. No evidence of acute intracranial abnormality. Chronic small-vessel white matter ischemic changes. 2. No static evidence of acute injury to the cervical spine. Electronically Signed   By: Harmon Pier M.D.   On: 03/04/2021 13:35   CT Cervical Spine Wo Contrast  Result Date: 03/04/2021 CLINICAL DATA:  60 year old female with fall, confusion and altered mental status. EXAM: CT HEAD  WITHOUT CONTRAST CT CERVICAL SPINE WITHOUT CONTRAST TECHNIQUE: Multidetector CT imaging of the head and cervical spine was performed following the standard protocol without intravenous contrast. Multiplanar CT image reconstructions of the cervical spine were also generated. COMPARISON:  02/26/2021 and prior studies FINDINGS: CT HEAD FINDINGS Brain: No evidence of acute infarction, hemorrhage, hydrocephalus, extra-axial collection or mass lesion/mass effect. White matter hypodensities are unchanged. Vascular: No hyperdense vessel or unexpected calcification. Skull: Normal. Negative for fracture or focal lesion. Sinuses/Orbits: A LEFT mastoid effusion is again noted. Other: None CT CERVICAL SPINE FINDINGS Alignment: Normal. Skull base and vertebrae: No acute fracture. No primary bone lesion or focal pathologic process.  Soft tissues and spinal canal: No prevertebral fluid or swelling. No visible canal hematoma. Disc levels: Mild multilevel degenerative disc disease/spondylosis and moderate multilevel facet arthropathy again noted. Upper chest: No acute abnormality Other: None IMPRESSION: 1. No evidence of acute intracranial abnormality. Chronic small-vessel white matter ischemic changes. 2. No static evidence of acute injury to the cervical spine. Electronically Signed   By: Harmon Pier M.D.   On: 03/04/2021 13:35   US Abdomen Complete  Result Date: 03/05/2021 CLINICAL DATA:  Transaminitis EXAM: ABDOMEN ULTRASOUND COMPLETE COMPARISON:  None. FINDINGS: Gallbladder: Large amount of sludge within the gallbladder. Probable small stones also noted in the gallbladder. No wall thickening or sonographic Murphy's sign. Common bile duct: Diameter: Normal caliber, 3 mm Liver: No focal lesion identified. Within normal limits in parenchymal echogenicity. Portal vein is patent on color Doppler imaging with normal direction of blood flow towards the liver. IVC: No abnormality visualized. Pancreas: Visualized portion unremarkable.  Spleen: Size and appearance within normal limits. Right Kidney: Length: 9.9 cm. Echogenicity within normal limits. No mass or hydronephrosis visualized. Left Kidney: Length: 9.8 cm. Echogenicity within normal limits. No mass or hydronephrosis visualized. Abdominal aorta: No aneurysm visualized. Other findings: None. IMPRESSION: Sludge and stones noted within the gallbladder. No sonographic evidence of acute cholecystitis. Electronically Signed   By: Charlett Nose M.D.   On: 03/05/2021 08:16   DG Chest Portable 1 View  Result Date: 03/04/2021 CLINICAL DATA:  Altered mental status EXAM: PORTABLE CHEST 1 VIEW COMPARISON:  02/26/2021 FINDINGS: This is a low volume film with mild bibasilar atelectasis. There is no evidence of focal airspace disease, pulmonary edema, suspicious pulmonary nodule/mass, pleural effusion, or pneumothorax. No acute bony abnormalities are identified. IMPRESSION: Low volume film with mild bibasilar atelectasis. Electronically Signed   By: Harmon Pier M.D.   On: 03/04/2021 13:11        Scheduled Meds:  benztropine  1 mg Oral BID   clonazePAM  0.5 mg Oral BID   divalproex  500 mg Oral BID   enoxaparin (LOVENOX) injection  40 mg Subcutaneous Q24H   feeding supplement  237 mL Oral BID BM   fluticasone  2 spray Each Nare Daily   folic acid  1 mg Oral Daily   multivitamin with minerals  1 tablet Oral Daily   nicotine  21 mg Transdermal Daily   OLANZapine zydis  2.5 mg Oral BID   pantoprazole (PROTONIX) IV  40 mg Intravenous Daily   potassium chloride  40 mEq Oral Once   thiamine  100 mg Oral Daily   umeclidinium-vilanterol  1 puff Inhalation Daily   ziprasidone  20 mg Oral BID WC   Continuous Infusions:  dextrose 5 % and 0.9% NaCl 125 mL/hr at 03/05/21 0947     LOS: 1 day    Time spent: 40 minutes    Ramiro Harvest, MD [facility]   To contact the attending provider between 7A-7P or the covering provider during after hours 7P-7A, please log into the web  site [email or web address] and access using universal [place] password for that web site. If you do not have the password, please call the hospital operator.  03/05/2021, 11:42 AM

## 2021-03-05 NOTE — TOC Progression Note (Signed)
Transition of Care [facility]) - Progression Note    Patient Details  Name: Michaela Wells MRN: [id number] Date of Birth: 27-Nov-1960  [facility] CM/SW Contact  Codylee Patil, Olegario Messier, RN Phone Number: 03/05/2021, 4:13 PM  Clinical Narrative:  Received message per nsg to discuss recc to dtr Michaela Wells.TC dtr Michaela Wells home tel#[phone]. Informed of Psych recc-Inpt Psych facility once medically stable for d/c. Noted PT/OT-not able to assess d/t medical issues. Michaela Wells in agreement to Inpt psych @ d/c.         Expected Discharge Plan and Services           Expected Discharge Date:  (unknown)                                     Social Determinants of Health (SDOH) Interventions    Readmission Risk Interventions No flowsheet data found.

## 2021-03-05 NOTE — Consult Note (Addendum)
    History of presenting illness:  Michaela Wells is a 60 y.o. female with medical history significant of bipolar 1 disorder, PTSD, schizo affective disorder, COPD recently hospitalized 02/26/2021-02/27/2021 for hypokalemia/acute kidney injury and noted at that time to have hallucinations who presents back to the ED after a fall this morning. Patient also noted to be having worsening auditory and visual hallucinations stating that the mother sees a guy with horses outside and is also concerned that someone is going to poison her and her sister was trying to kill her.  It is also noted that patient feels her neighbors have placed a spell on her heart.  It is also noted that patient has had a 20 pound weight loss over the past month.  Psych consult placed for worsening auditory and visual hallucinations.   Patient is seen and attempted to assess and evaluate, however she was unable to participate in psychiatric evaluation.  Throughout the conversation patient continues to present with ongoing delusions,visual hallucinations, flight of ideas, and grandiosity.  Patient continues to be disoriented and confused.  Chart review shows patient has been exhibiting worsening psychosis, paranoia, delusions for several months with no improvement.  Recent changes were made from olanzapine to ziprasidone, which continues to not be effective in management of her psychosis.  Chart review further indicates patient previously stated olanzapine was not working well for her, although at that time she was smoking nicotine as well as marijuana which may have decreased the efficacy of olanzapine. Patient was also started on Klonopin 0.5 mg p.o. twice daily as needed in March 2022, to help manage anxiety.  At that time of initiation Klonopin was only for short-term use, in which patient appears to continue to be taking this medication scheduled versus as needed.    -Will decrease ziprasidone to 20 mg p.o. twice daily.  We will  initiate olanzapine 2.5 mg p.o. twice daily to further target psychosis.  The goal will be to discontinue ziprasidone entirely after safe transition, and titrate olanzapine to further target psychiatric symptoms to include psychosis, paranoia, delusions, auditory and visual hallucinations. -Patient will likely benefit from inpatient psychiatric admission once medically stable, as noted the symptoms have been ongoing for several months and patient will benefit from crisis stabilization at this time and appropriate medication management to target symptoms listed above. -EKG obtained yesterday shows QTC within normal. -Will obtain valproic acid level.  -Psychiatry will continue to follow this patient.

## 2021-03-05 NOTE — Progress Notes (Signed)
Patients bed alarm going off, nurse tech and RN found patient halfway out of the bed, one leg over the bed rail on the floor. Posey belt still on patient and attached to the bed. Assisted patient to the [facility], she voided. Patient is now back in bed laying down. She is stating that we the nursing staff killed her cat.

## 2021-03-05 NOTE — Progress Notes (Signed)
PT Cancellation Note  Patient Details Name: Michaela Wells MRN: [id number] DOB: Jan 06, 1961   Cancelled Treatment:    Reason Eval/Treat Not Completed: Patient not medically ready,    Rada Hay 03/05/2021, 1:44 PM Blanchard Kelch PT Acute Rehabilitation Services Pager [phone] Office [phone]

## 2021-03-05 NOTE — Progress Notes (Signed)
At 0659: CBG checked it was 49, hypoglycemic protocol initiated.  Pt is alert and talking with confusion, 2 cups of juice given and 1 glutose 40% oral gel; will rechecked sugar after ; incoming dayshift nurse informed.

## 2021-03-05 NOTE — Evaluation (Addendum)
Clinical/Bedside Swallow Evaluation Patient Details  Name: Michaela Wells MRN: [id number] Date of Birth: 07/01/1961  Today's Date: 03/05/2021 Time: SLP Start Time (ACUTE ONLY): 1010 SLP Stop Time (ACUTE ONLY): 1035 SLP Time Calculation (min) (ACUTE ONLY): 25 min  Past Medical History:  Past Medical History:  Diagnosis Date   Bronchial asthma    Chicken pox    COPD (chronic obstructive pulmonary disease) (HCC)    Depression    Frequent headaches    GERD (gastroesophageal reflux disease)    UTI (lower urinary tract infection)    Past Surgical History:  Past Surgical History:  Procedure Laterality Date   ESOPHAGEAL DILATION     TONSILLECTOMY     HPI:  60yo female admitted 03/04/21 with a fall and AMS. PMH: BiPolar1, PTSD, schizoaffective disorder, COPD, depression, GERD. Recent hospitalization 7/4-5/22 for hypokalemia/AKI and auditory and visual hallucinations. Pt's daughter reports decrease in PO intake with severe pain with swallow, and 20 pound weight loss over the past month.   Assessment / Plan / Recommendation Clinical Impression  Pt was seen at bedside for assessment of swallow function and safety. Pt was awake and alert, but was distractible throughout this evaluation. CN exam unremarkable. Pt has missing dentition. She reports substernal pain with swallowing, raising suspicion for esophageal dysmotility. Pt accepted trials of thin liquid, puree, and solid textures. Pt passed the 3oz water challenge, and did not exhibit overt s/s aspiration following any consistency given. Pt did exhibit a congested nonproductive cough prior to PO intake. RN reports pt had difficulty with whole PO meds given with puree. Recommend crushed medications in puree.  Recommend continuing dys 3 (mech soft) diet with thin liquids, meds crushed in puree. Also recommend adherence to reflux precautions given history of GERD and esophageal dilation. Regular barium swallow may be beneficial to evaluation  current status of esophageal motility, as it does not appear one has been done recently within this system. Safe swallow precautions posted at [facility]. No further ST intervention recommended at this time. Please reconsult if needs arise.  SLP Visit Diagnosis: Dysphagia, unspecified (R13.10)    Aspiration Risk  Mild aspiration risk    Diet Recommendation Dysphagia 3 (Mech soft);Thin liquid   Liquid Administration via: Cup;Straw Medication Administration: Other (Comment) (as tolerated) Supervision: Patient able to self feed;Intermittent supervision to cue for compensatory strategies Compensations: Slow rate;Small sips/bites Postural Changes: Seated upright at 90 degrees;Remain upright for at least 30 minutes after po intake    Other  Recommendations Oral Care Recommendations: Oral care BID   Follow up Recommendations 24 hour supervision/assistance          Prognosis Prognosis for Safe Diet Advancement: Fair Barriers to Reach Goals: Other (Comment);Cognitive deficits (poor dentition)      Swallow Study   General Date of Onset: 03/04/21 HPI: 59yo female admitted 03/04/21 with a fall and AMS. PMH: BiPolar1, PTSD, schizoaffective disorder, COPD, depression, GERD. Recent hospitalization 7/4-5/22 for hypokalemia/AKI and auditory and visual hallucinations. Pt's daughter reports decrease in PO intake with severe pain with swallow, and 20 pound weight loss over the past month. Type of Study: Bedside Swallow Evaluation Previous Swallow Assessment: none Diet Prior to this Study: Dysphagia 3 (soft);Thin liquids Temperature Spikes Noted: No Respiratory Status: Room air History of Recent Intubation: No Behavior/Cognition: Alert;Cooperative;Pleasant mood;Confused;Distractible;Requires cueing Oral Cavity Assessment: Within Functional Limits Oral Care Completed by SLP: No Oral Cavity - Dentition: Missing dentition Vision: Functional for self-feeding Self-Feeding Abilities: Able to feed self Patient  Positioning: Upright in bed Baseline  Vocal Quality: Normal Volitional Cough: Strong;Congested Volitional Swallow: Able to elicit    Oral/Motor/Sensory Function Overall Oral Motor/Sensory Function: Within functional limits   Ice Chips Ice chips: Within functional limits Presentation: Spoon   Thin Liquid Thin Liquid: Within functional limits Presentation: Cup;Straw    Nectar Thick Nectar Thick Liquid: Not tested   Honey Thick Honey Thick Liquid: Not tested   Puree Puree: Within functional limits Presentation: Spoon   Solid     Solid: Within functional limits Presentation: Self Fed     Guyla Bless B. Murvin Natal, [facility], CCC-SLP Speech Language Pathologist Office: [phone] Pager: [phone]  Leigh Aurora 03/05/2021,10:43 AM

## 2021-03-05 NOTE — Progress Notes (Signed)
Patient is having multiple hallucinations. Patient is telling staff that there are children present in the room. She is trying to climb out of bed to get her cat. Patient has been redirected multiple times back to a safe position in the bed. Patient is a high fall risk and not following commands. Staff has applied the posey bed safety belt, patient was instructed on how to remove the belt if needed. Educated patient that the belt was for her safety to try and keep her from climbing out of bed and falling and to also allow the staff time to get into her room. Fall mats are in place along with the bed alarm.

## 2021-03-05 NOTE — Progress Notes (Signed)
OT Cancellation Note  Patient Details Name: Michaela Wells MRN: [id number] DOB: April 25, 1961   Cancelled Treatment:    Reason Eval/Treat Not Completed: Patient not medically ready. Patient having hallucinations with nursing staff.   Sharyn Blitz OTR/L, MS Acute Rehabilitation Department Office# [phone] Pager# [phone]   [facility] Guest Judy Pollman 03/05/2021, 1:36 PM

## 2021-03-06 ENCOUNTER — Institutional Professional Consult (permissible substitution): Payer: Medicaid Other | Admitting: Emergency Medicine

## 2021-03-06 ENCOUNTER — Inpatient Hospital Stay (HOSPITAL_COMMUNITY): Payer: Medicaid Other

## 2021-03-06 DIAGNOSIS — R1310 Dysphagia, unspecified: Secondary | ICD-10-CM

## 2021-03-06 LAB — COMPREHENSIVE METABOLIC PANEL
ALT: 36 U/L (ref 0–44)
AST: 59 U/L — ABNORMAL HIGH (ref 15–41)
Albumin: 3 g/dL — ABNORMAL LOW (ref 3.5–5.0)
Alkaline Phosphatase: 59 U/L (ref 38–126)
Anion gap: 7 (ref 5–15)
BUN: 7 mg/dL (ref 6–20)
CO2: 26 mmol/L (ref 22–32)
Calcium: 7.8 mg/dL — ABNORMAL LOW (ref 8.9–10.3)
Chloride: 110 mmol/L (ref 98–111)
Creatinine, Ser: 1 mg/dL (ref 0.44–1.00)
GFR, Estimated: >60 mL/min (ref >60)
Glucose, Bld: 93 mg/dL (ref 70–99)
Potassium: 3.5 mmol/L (ref 3.5–5.1)
Sodium: 143 mmol/L (ref 135–145)
Total Bilirubin: 0.3 mg/dL (ref 0.3–1.2)
Total Protein: 6 g/dL — ABNORMAL LOW (ref 6.5–8.1)

## 2021-03-06 LAB — CBC
HCT: 39.5 % (ref 36.0–46.0)
Hemoglobin: 12.7 g/dL (ref 12.0–15.0)
MCH: 29.9 pg (ref 26.0–34.0)
MCHC: 32.2 g/dL (ref 30.0–36.0)
MCV: 92.9 fL (ref 80.0–100.0)
Platelets: 254 10*3/uL (ref 150–400)
RBC: 4.25 MIL/uL (ref 3.87–5.11)
RDW: 14 % (ref 11.5–15.5)
WBC: 4 10*3/uL (ref 4.0–10.5)
nRBC: 0 % (ref 0.0–0.2)

## 2021-03-06 LAB — URINE CULTURE: Culture: NO GROWTH

## 2021-03-06 LAB — GLUCOSE, CAPILLARY
Glucose-Capillary: 92 mg/dL (ref 70–99)
Glucose-Capillary: 99 mg/dL (ref 70–99)
Glucose-Capillary: 98 mg/dL (ref 70–99)
Glucose-Capillary: 116 mg/dL — ABNORMAL HIGH (ref 70–99)

## 2021-03-06 LAB — VALPROIC ACID LEVEL: Valproic Acid Lvl: 132 ug/mL — ABNORMAL HIGH (ref 50.0–100.0)

## 2021-03-06 LAB — MAGNESIUM: Magnesium: 2.2 mg/dL (ref 1.7–2.4)

## 2021-03-06 LAB — PHOSPHORUS: Phosphorus: 2.3 mg/dL — ABNORMAL LOW (ref 2.5–4.6)

## 2021-03-06 IMAGING — RF DG ESOPHAGUS
7 series · 12 of 13 positions shown · non-contrast
Comparison: None.

CLINICAL DATA: Limited study due to patient immobility.

EXAM:
ESOPHOGRAM/BARIUM SWALLOW
TECHNIQUE: Single contrast examination was performed using  thin barium.
FLUOROSCOPY TIME:  Fluoroscopy Time:  2 minutes and 6 seconds.
Radiation Exposure Index (if provided by the fluoroscopic device):
27 mGy
Number of Acquired Spot Images:

[Series 1: fluoro_barium 2fps_bw · 0.17mm/px · 1 of 1 slices shown (1 of 6)]
[im 1/1]
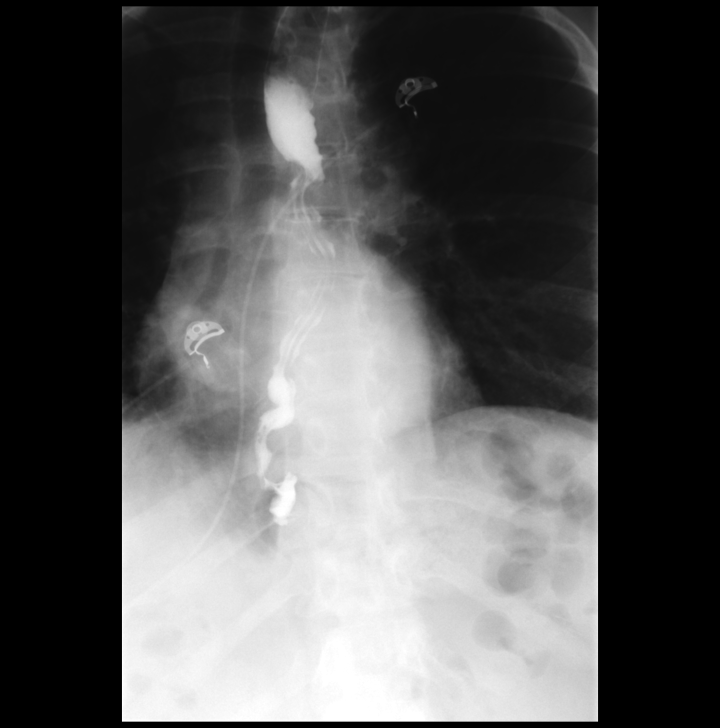

[Series 2: cp_standard · 0.52mm/px · 4 of 62 frames shown]
[frame 10/62]
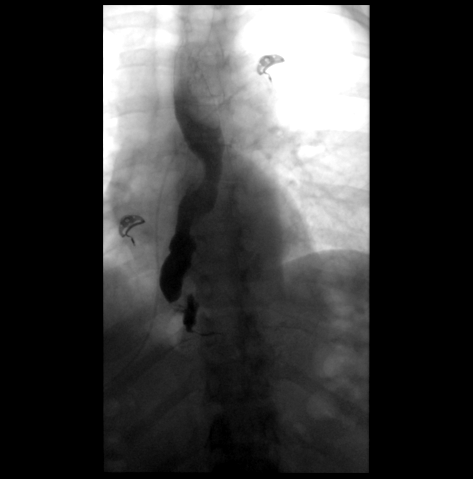
[frame 32/62]
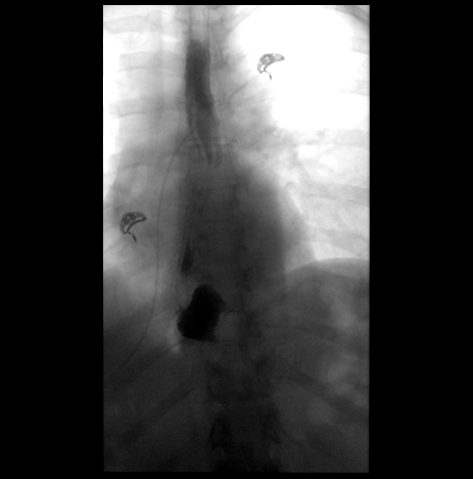
[frame 40/62]
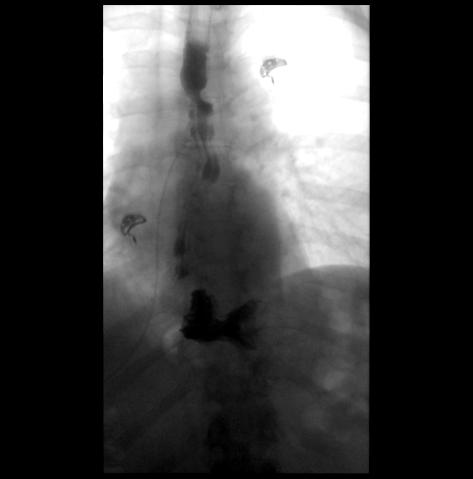
[frame 53/62]
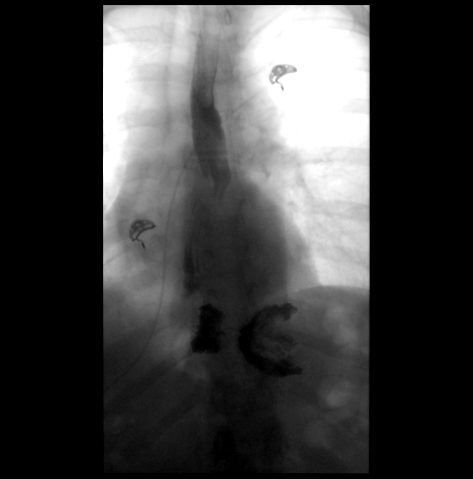

[Series 3: fluoro_barium 2fps_bw · 0.17mm/px · 1 of 2 frames shown (2 of 6)]
[frame 1/2]
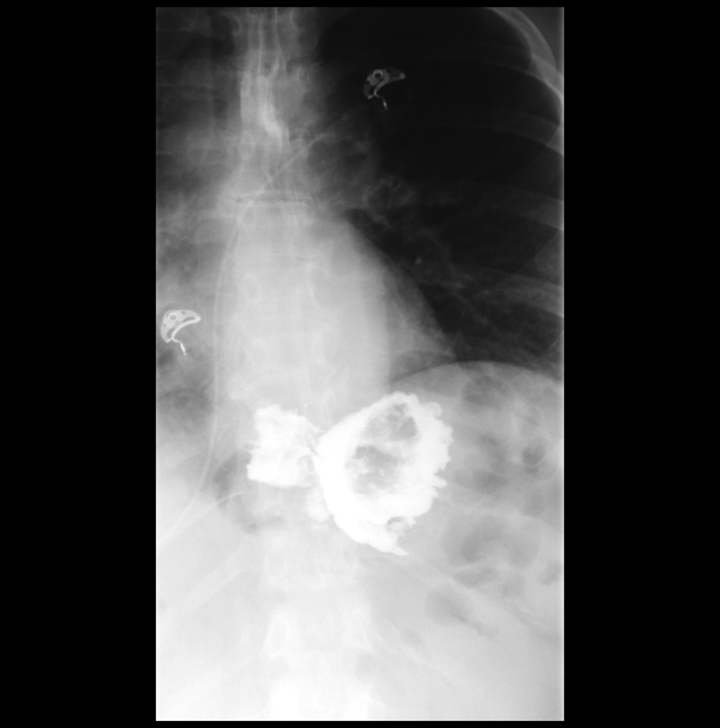

[Series 4: fluoro_barium 2fps_bw · 0.18mm/px · 2 of 2 frames shown (3 of 6)]
[frame 1/2]
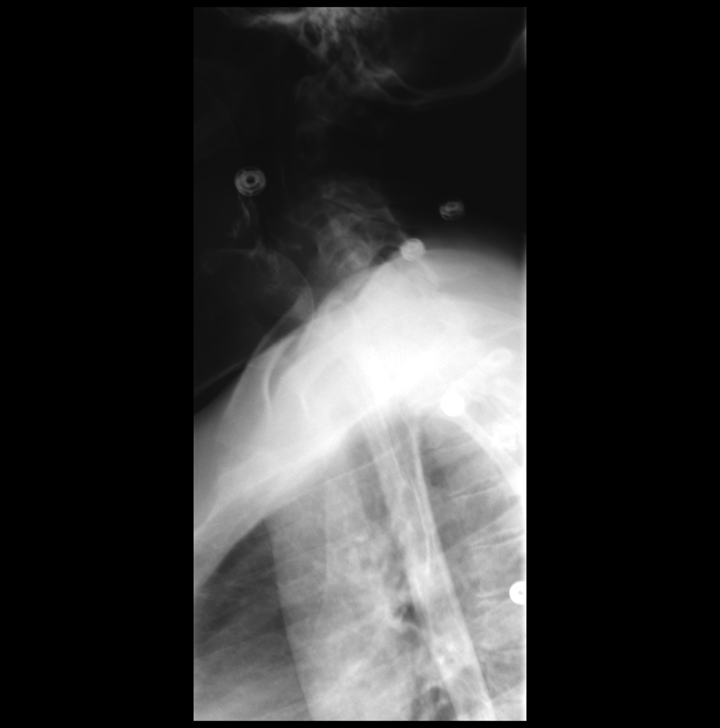
[frame 2/2]
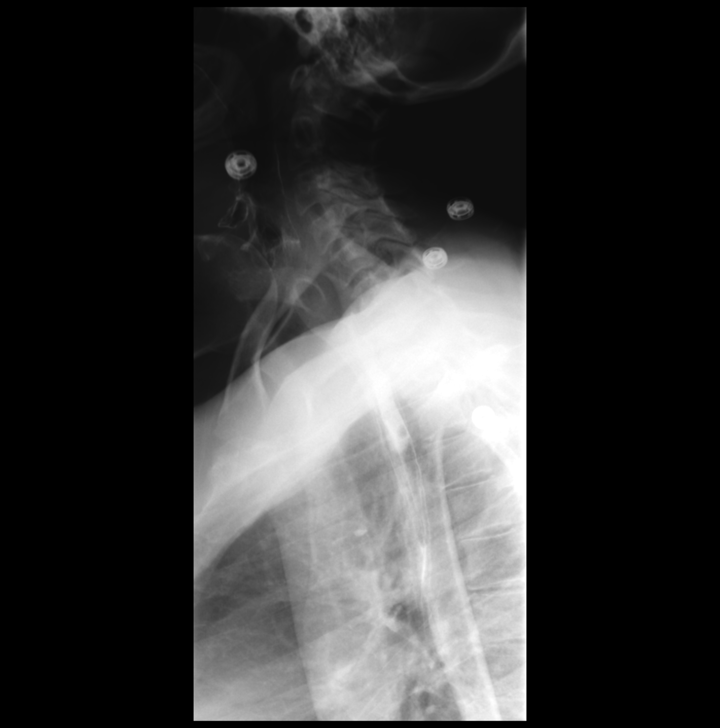

[Series 5: fluoro_barium 2fps_bw · 0.18mm/px · 1 of 1 slices shown (4 of 6)]
[im 1/1]
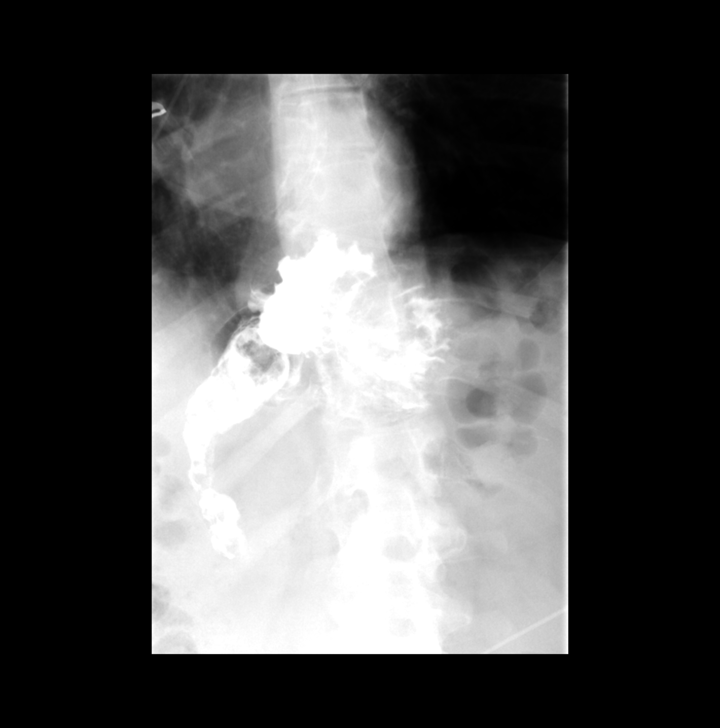

[Series 6: fluoro_barium 2fps_bw · 0.18mm/px · 2 of 2 frames shown (5 of 6)]
[frame 1/2]
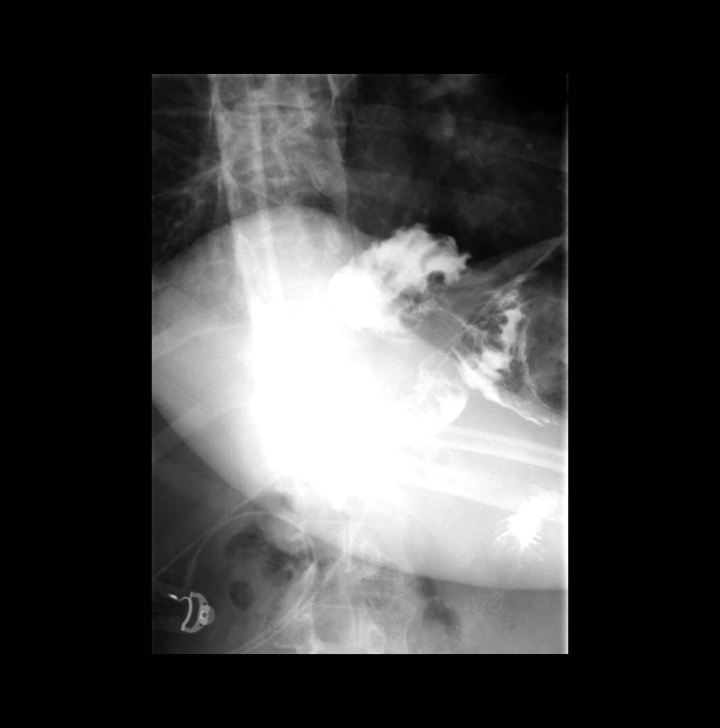
[frame 2/2]
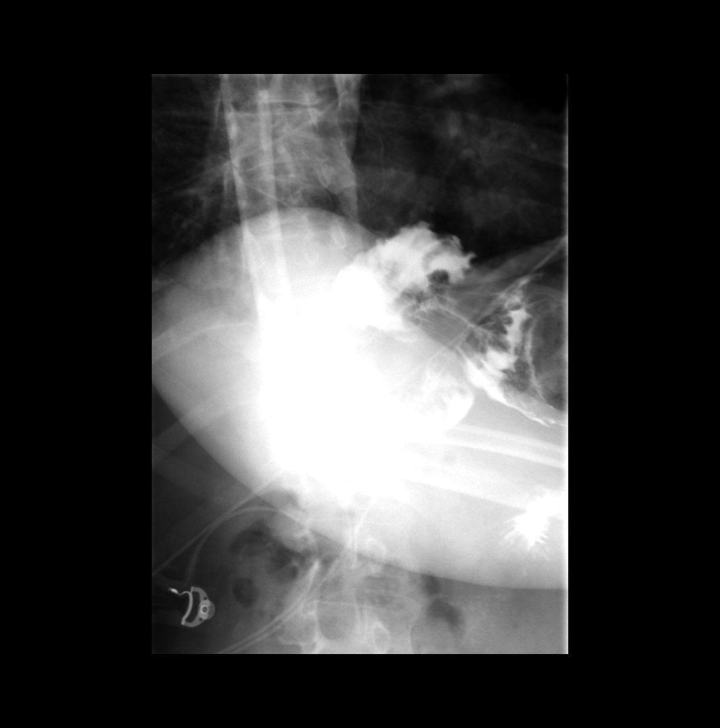

[Series 7: fluoro_barium 2fps_bw · 0.18mm/px · 1 of 1 slices shown (6 of 6)]
[im 1/1]
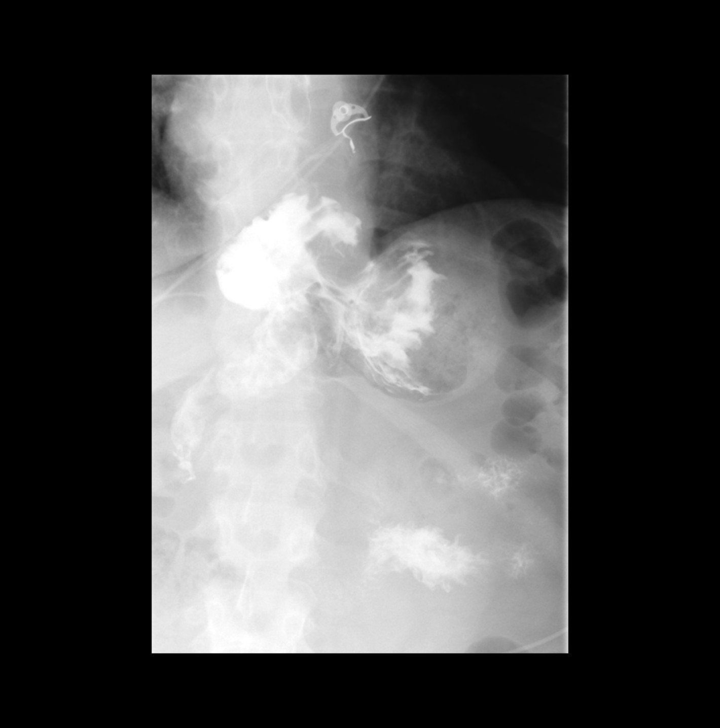

[12 of 13 positions shown; findings below may reference images not displayed]

FINDINGS: Single contrast imaging shows no gross esophageal mass or stricture.
No esophageal diverticulum.

Small hiatal hernia. Mass-effect noted in the proximal stomach,
potentially from food. Gastric emptying is prompt.
IMPRESSION: Limited study due to patient immobility. No gross esophageal mass
lesion or stricture.

Small hiatal hernia.

Filling defect in the stomach likely represents food material.
Further evaluation with upper GI series after an appropriate period
of fasting could be used to further evaluate as clinically
warranted. This study should be deferred until the patient is able
to better cooperate with positioning. If immediate assessment is
required, CT abdomen/pelvis could be used to further evaluate.

## 2021-03-06 MED ORDER — K PHOS MONO-SOD PHOS DI & MONO 155-852-130 MG PO TABS
250.0000 mg | ORAL_TABLET | Freq: Two times a day (BID) | ORAL | Status: AC
  Administered 2021-03-06 – 2021-03-08 (×6): 250 mg via ORAL
  Filled 2021-03-06 (×6): qty 1

## 2021-03-06 MED ORDER — PANTOPRAZOLE SODIUM 40 MG PO TBEC
40.0000 mg | DELAYED_RELEASE_TABLET | Freq: Every day | ORAL | Status: DC
  Administered 2021-03-07 – 2021-03-13 (×7): 40 mg via ORAL
  Filled 2021-03-06 (×7): qty 1

## 2021-03-06 MED ORDER — CALCIUM CARBONATE-VITAMIN D 500-200 MG-UNIT PO TABS
2.0000 | ORAL_TABLET | Freq: Three times a day (TID) | ORAL | Status: AC
  Administered 2021-03-06 – 2021-03-08 (×9): 2 via ORAL
  Filled 2021-03-06 (×9): qty 2

## 2021-03-06 MED ORDER — HYDRALAZINE HCL 20 MG/ML IJ SOLN
5.0000 mg | Freq: Four times a day (QID) | INTRAMUSCULAR | Status: DC | PRN
  Administered 2021-03-06 – 2021-03-07 (×2): 5 mg via INTRAVENOUS
  Filled 2021-03-06 (×2): qty 1

## 2021-03-06 MED ORDER — POTASSIUM CHLORIDE CRYS ER 20 MEQ PO TBCR
40.0000 meq | EXTENDED_RELEASE_TABLET | Freq: Once | ORAL | Status: AC
  Administered 2021-03-06: 40 meq via ORAL
  Filled 2021-03-06: qty 2

## 2021-03-06 NOTE — Evaluation (Signed)
Occupational Therapy Evaluation Patient Details Name: Michaela Wells MRN: [id number] DOB: June 02, 1961 Today's Date: 03/06/2021    History of Present Illness patient is a 60 year old female who presented to the emergency room after a fall at home down stairs. patient was found to have severe hypokalemia, acute kidney injury,acute metabolic encephalopathy, and transaminitis. patient has been noted to have multiple hallucinations during admission. [id number] 1 disorder, PTSD, schizoaffective disorder, COPD recently hospitalized (7/4-7/5) for hypokalemia and acute kidney injury.   Clinical Impression   Patient is a 60 year old female who was noted to have had a decline in ability to complete ADLs. Patient reported previously being able to complete bathing, dressing, toileting tasks with MI with daughter support for IADLs. Patient is currently noted to have increased impulsiveness with noted hallucinations. Patient reported that her cat was by bathroom door at end of evaluation. Nursing made aware. Patient was min guard for sit to stand functional mobility in room with education to stay on task and remain in room at this time. Patient required min guard for all toileting tasks with set up. Patient was SUP for donning/doffing socks with patient expressing concerns over therapist "taking away my socks". Patient was educated that patient could keep socks and place them right back on. Patient could benefit from acute OT services for standing balance, activity tolerance and cognitive processing to increase independence in ADLs. Patient recommended to have 24/7 support in next level of care. Patient to transition to behavioral health at time of d/c    Follow Up Recommendations  Behavioral health, Supervision/Assistance - 24 hour    Equipment Recommendations  3 in 1 bedside commode;Tub/shower seat    Recommendations for Other Services       Precautions / Restrictions Precautions Precautions:  Fall Restrictions Weight Bearing Restrictions: No      Mobility Bed Mobility Overal bed mobility: Modified Independent                  Transfers Overall transfer level: Needs assistance Equipment used: Rolling walker (2 wheeled) Transfers: Sit to/from [employer] Sit to Stand: Min guard Stand pivot transfers: Min guard            Balance Overall balance assessment: Needs assistance   Sitting balance-Leahy Scale: Good Sitting balance - Comments: noted to have tremor like movements of BUE when using and at rest.     Standing balance-Leahy Scale: Fair                             ADL either performed or assessed with clinical judgement   ADL Overall ADL's : Needs assistance/impaired Eating/Feeding: Supervision/ safety;Sitting   Grooming: Wash/dry face;Supervision/safety;Set up;Sitting   Upper Body Bathing: Minimal assistance;Set up;Supervision/ safety   Lower Body Bathing: Minimal assistance;Set up;Supervison/ safety;Sitting/lateral leans   Upper Body Dressing : Supervision/safety;Sitting   Lower Body Dressing: Minimal assistance;Sit to/from stand   Toilet Transfer: Supervision/safety;BSC;Ambulation   Toileting- Architect and Hygiene: Supervision/safety;Sit to/from stand       Functional mobility during ADLs: Min guard;Rolling walker General ADL Comments: patient was noted to attempt to walk out door while being directed to use bedside commode. patient required increased multimodal cues to attend to tasks.     Vision   Vision Assessment?: No apparent visual deficits                Pertinent Vitals/Pain Pain Assessment: No/denies pain     Hand  Dominance Right   Extremity/Trunk Assessment Upper Extremity Assessment Upper Extremity Assessment: Overall WFL for tasks assessed   Lower Extremity Assessment Lower Extremity Assessment: Defer to PT evaluation   Cervical / Trunk Assessment Cervical / Trunk  Assessment: Normal   Communication Communication Communication: No difficulties   Cognition Arousal/Alertness: Awake/alert Behavior During Therapy: Anxious;Restless;Impulsive Overall Cognitive Status: Difficult to assess                                 General Comments: patient was noted to be impulsive and exit seeking during session. patient was noted to see "my cat" in room by bathroom door.                    Home Living Family/patient expects to be discharged to:: Private residence Living Arrangements: Children Available Help at Discharge: Family Type of Home: House                                  Prior Functioning/Environment Level of Independence: Independent with assistive device(s)        Comments: patient reported being independent in dressing, bathing and toileting tasks with daughter support fro medications, cooking cleaning and IADLs.        OT Problem List: Impaired balance (sitting and/or standing);Decreased activity tolerance;Decreased safety awareness;Decreased strength      OT Treatment/Interventions:      OT Goals(Current goals can be found in the care plan section) Acute Rehab OT Goals Patient Stated Goal: wants to leave OT Goal Formulation: With patient Time For Goal Achievement: 03/20/21 Potential to Achieve Goals: Good  OT Frequency:     Barriers to D/C:                          AM-PAC OT "6 Clicks" Daily Activity     Outcome Measure Help from another person eating meals?: None Help from another person taking care of personal grooming?: None Help from another person toileting, which includes using toliet, bedpan, or urinal?: A Little Help from another person bathing (including washing, rinsing, drying)?: A Little Help from another person to put on and taking off regular upper body clothing?: A Little Help from another person to put on and taking off regular lower body clothing?: A Little 6 Click  Score: 20   End of Session Equipment Utilized During Treatment: Gait belt;Rolling walker  Activity Tolerance: Patient tolerated treatment well Patient left: in bed;with call bell/phone within reach;with bed alarm set  OT Visit Diagnosis: Unsteadiness on feet (R26.81)                Time: 0240-9735 OT Time Calculation (min): 17 min Charges:  OT General Charges $OT Visit: 1 Visit OT Evaluation $OT Eval Low Complexity: 1 Low  Sharyn Blitz OTR/L, MS Acute Rehabilitation Department Office# [phone] Pager# [phone]   [facility] Guest Merlean Pizzini 03/06/2021, 11:33 AM

## 2021-03-06 NOTE — Consult Note (Addendum)
[facility] Face-to-Face Psychiatry Consult   Reason for Consult:  Hallucinations Referring Physician:  Dr. Janee Morn Patient Identification: Michaela Wells MRN:  [id number] Principal Diagnosis: Hypokalemia Diagnosis:  Principal Problem:   Hypokalemia Active Problems:   COPD (chronic obstructive pulmonary disease) (HCC)   GAD (generalized anxiety disorder)   GERD (gastroesophageal reflux disease)   Bipolar I disorder, current or most recent episode depressed, with psychotic features (HCC)   PTSD (post-traumatic stress disorder)   MDD (major depressive disorder)   AKI (acute kidney injury) (HCC)   Dehydration   Transaminitis   Auditory hallucination   Visual hallucinations   Acute metabolic encephalopathy   Total Time spent with patient: 30 minutes  Subjective:   Michaela Wells is a 60 y.o. female patient admitted with acute kidney injury hallucinations. On today's evaluation she continues to have some apparent delusions and loose associations. She is more lucid at this time and able to have more linear conversation although she remains delusional. She denies hallucinations and psychosis, however nursing staff reports she " has been hallucinating all morning, seeing things. "  When assessing for orientation patient is able to tell me she is in a hospital. "  I know I am in a hospital however I been here so long I can tell you what today's date is."  When assessing for time she is able to look at the clock, however states" it is 530", when affect it was 1025.  Patient denies any side effects at this time.  She denies any suicidal ideations, homicidal ideations, and or auditory visual hallucinations.  HPI:  Michaela Wells is a 60 y.o. female with medical history significant of bipolar 1 disorder, PTSD, schizo affective disorder, COPD recently hospitalized 02/26/2021-02/27/2021 for hypokalemia/acute kidney injury and noted at that time to have hallucinations who presents back to the ED after a fall  this morning. Patient also noted to be having worsening auditory and visual hallucinations stating that the mother sees a guy with horses outside and is also concerned that someone is going to poison her and her sister was trying to kill her.  It is also noted that patient feels her neighbors have placed a spell on her heart.  It is also noted that patient has had a 20 pound weight loss over the past month.  Past Psychiatric History: Schizoaffective disorder, bipolar type, PTSD.  Currently receiving outpatient psychiatric services from [facility] behavioral health outpatient [facility], [place] nurse practitioner.  Previous medication she was treated with include ziprasidone, quetiapine, lorazepam, Klonopin and BuSpar.  History shows 1 previous suicide attempt by self laceration over 30 years ago.  Risk to Self: Yes due to cognitive impairment Risk to Others: No Prior Inpatient Therapy: Yes over 30 years ago in 1985 Prior Outpatient Therapy: See above  Past Medical History:  Past Medical History:  Diagnosis Date   Bronchial asthma    Chicken pox    COPD (chronic obstructive pulmonary disease) (HCC)    Depression    Frequent headaches    GERD (gastroesophageal reflux disease)    UTI (lower urinary tract infection)     Past Surgical History:  Procedure Laterality Date   ESOPHAGEAL DILATION     TONSILLECTOMY     Family History:  Family History  Problem Relation Age of Onset   Other Mother        MVA-Deceased [pt was age 41]   COPD Father        Deceased   Heart attack Maternal Grandmother  Stroke Maternal Grandmother    Brain cancer Maternal Grandmother    Arthritis/Rheumatoid Maternal Grandmother    Heart attack Maternal Grandfather    Stroke Maternal Grandfather    Heart disease Maternal Aunt    Stroke Maternal Aunt        #1   Heart attack Maternal Aunt        #1   Hypertension Maternal Aunt        #1   Arthritis/Rheumatoid Sister    COPD Sister    Heart disease  Maternal Uncle    Heart attack Maternal Uncle        x4   Heart disease Brother        #1   Heart attack Brother        #1   Diabetes Brother    Diabetes Maternal Aunt    Mental retardation Maternal Aunt    Healthy Daughter        x2   Drug abuse Daughter        #1   Drug abuse Daughter        #2   Kidney Stones Daughter        #2   Other Son        Agoraphobia   Family Psychiatric  History: As per daughter Lowella Bandy her sister has a diagnosis of dementia. Social History:  Social History   Substance and Sexual Activity  Alcohol Use Yes     Social History   Substance and Sexual Activity  Drug Use Not Currently    Social History   Socioeconomic History   Marital status: Single    Spouse name: Not on file   Number of children: Not on file   Years of education: Not on file   Highest education level: Not on file  Occupational History   Not on file  Tobacco Use   Smoking status: Every Day    Pack years: 0.00   Smokeless tobacco: Never  Substance and Sexual Activity   Alcohol use: Yes   Drug use: Not Currently   Sexual activity: Not Currently  Other Topics Concern   Not on file  Social History Narrative   Not on file   Social Determinants of Health   Financial Resource Strain: Not on file  Food Insecurity: Not on file  Transportation Needs: Not on file  Physical Activity: Not on file  Stress: Not on file  Social Connections: Not on file   Additional Social History:    Allergies:   Allergies  Allergen Reactions   Penicillins Other (See Comments)    Childhood allergy- Reaction?? Has patient had a PCN reaction causing immediate rash, facial/tongue/throat swelling, SOB or lightheadedness with hypotension: Unknown Has patient had a PCN reaction causing severe rash involving mucus membranes or skin necrosis: Unknown Has patient had a PCN reaction that required hospitalization: Unknown Has patient had a PCN reaction occurring within the last 10 years:  Unknown If all of the above answers are "NO", then may proceed with Cephalosporin use.      Labs:  Results for orders placed or performed during the hospital encounter of 03/04/21 (from the past 48 hour(s))  Comprehensive metabolic panel     Status: Abnormal   Collection Time: 03/04/21  2:10 PM  Result Value Ref Range   Sodium 139 135 - 145 mmol/L   Potassium 2.4 (LL) 3.5 - 5.1 mmol/L    Comment: CRITICAL RESULT CALLED TO, READ BACK BY AND VERIFIED WITH: LEONARD,S. RN  AT 1455 03/04/21 MULLINS,T    Chloride 102 98 - 111 mmol/L   CO2 26 22 - 32 mmol/L   Glucose, Bld 83 70 - 99 mg/dL    Comment: Glucose reference range applies only to samples taken after fasting for at least 8 hours.   BUN 20 6 - 20 mg/dL   Creatinine, Ser 1.61 (H) 0.44 - 1.00 mg/dL   Calcium 7.3 (L) 8.9 - 10.3 mg/dL   Total Protein 5.7 (L) 6.5 - 8.1 g/dL   Albumin 2.8 (L) 3.5 - 5.0 g/dL   AST 096 (H) 15 - 41 U/L   ALT 46 (H) 0 - 44 U/L   Alkaline Phosphatase 50 38 - 126 U/L   Total Bilirubin 0.6 0.3 - 1.2 mg/dL   GFR, Estimated 41 (L) >60 mL/min    Comment: (NOTE) Calculated using the CKD-EPI Creatinine Equation (2021)    Anion gap 11 5 - 15    Comment: Performed at [facility], [address] [place], [place] [address]  CBC with Differential     Status: None   Collection Time: 03/04/21  2:10 PM  Result Value Ref Range   WBC 5.3 4.0 - 10.5 K/uL   RBC 4.08 3.87 - 5.11 MIL/uL   Hemoglobin 12.3 12.0 - 15.0 g/dL   HCT 98.1 19.1 - 47.8 %   MCV 89.7 80.0 - 100.0 fL   MCH 30.1 26.0 - 34.0 pg   MCHC 33.6 30.0 - 36.0 g/dL   RDW 29.5 62.1 - 30.8 %   Platelets 202 150 - 400 K/uL   nRBC 0.0 0.0 - 0.2 %   Neutrophils Relative % 57 %   Neutro Abs 3.0 1.7 - 7.7 K/uL   Lymphocytes Relative 23 %   Lymphs Abs 1.2 0.7 - 4.0 K/uL   Monocytes Relative 19 %   Monocytes Absolute 1.0 0.1 - 1.0 K/uL   Eosinophils Relative 0 %   Eosinophils Absolute 0.0 0.0 - 0.5 K/uL   Basophils Relative 0 %    Basophils Absolute 0.0 0.0 - 0.1 K/uL   Immature Granulocytes 1 %   Abs Immature Granulocytes 0.03 0.00 - 0.07 K/uL    Comment: Performed at [facility], [address] [place], [place] [address]  Ethanol     Status: None   Collection Time: 03/04/21  2:10 PM  Result Value Ref Range   Alcohol, Ethyl (B) <10 <10 mg/dL    Comment: (NOTE) Lowest detectable limit for serum alcohol is 10 mg/dL.  For medical purposes only. Performed at [facility], [address], [place], [place] [address]   Resp Panel by RT-PCR (Flu A&B, Covid) Nasopharyngeal Swab     Status: None   Collection Time: 03/04/21  2:28 PM   Specimen: Nasopharyngeal Swab; Nasopharyngeal(NP) swabs in vial transport medium  Result Value Ref Range   SARS Coronavirus 2 by RT PCR NEGATIVE NEGATIVE    Comment: (NOTE) SARS-CoV-2 target nucleic acids are NOT DETECTED.  The SARS-CoV-2 RNA is generally detectable in upper respiratory specimens during the acute phase of infection. The lowest concentration of SARS-CoV-2 viral copies this assay can detect is 138 copies/mL. A negative result does not preclude SARS-Cov-2 infection and should not be used as the sole basis for treatment or other patient management decisions. A negative result may occur with  improper specimen collection/handling, submission of specimen other than nasopharyngeal swab, presence of viral mutation(s) within the areas targeted by this assay, and inadequate number  of viral copies(<138 copies/mL). A negative result must be combined with clinical observations, patient history, and epidemiological information. The expected result is Negative.  Fact Sheet for Patients:  BloggerCourse.com  Fact Sheet for Healthcare Providers:  SeriousBroker.it  This test is no t yet approved or cleared by the [place] FDA and  has been authorized for detection and/or diagnosis of  SARS-CoV-2 by FDA under an Emergency Use Authorization (EUA). This EUA will remain  in effect (meaning this test can be used) for the duration of the COVID-19 declaration under Section 564(b)(1) of the Act, 21 U.S.C.section 360bbb-3(b)(1), unless the authorization is terminated  or revoked sooner.       Influenza A by PCR NEGATIVE NEGATIVE   Influenza B by PCR NEGATIVE NEGATIVE    Comment: (NOTE) The Xpert Xpress SARS-CoV-2/FLU/RSV plus assay is intended as an aid in the diagnosis of influenza from Nasopharyngeal swab specimens and should not be used as a sole basis for treatment. Nasal washings and aspirates are unacceptable for Xpert Xpress SARS-CoV-2/FLU/RSV testing.  Fact Sheet for Patients: BloggerCourse.com  Fact Sheet for Healthcare Providers: SeriousBroker.it  This test is not yet approved or cleared by the [place] FDA and has been authorized for detection and/or diagnosis of SARS-CoV-2 by FDA under an Emergency Use Authorization (EUA). This EUA will remain in effect (meaning this test can be used) for the duration of the COVID-19 declaration under Section 564(b)(1) of the Act, 21 U.S.C. section 360bbb-3(b)(1), unless the authorization is terminated or revoked.  Performed at [facility], [address] [place], [place] [address]   Magnesium     Status: None   Collection Time: 03/04/21  6:43 PM  Result Value Ref Range   Magnesium 2.3 1.7 - 2.4 mg/dL    Comment: Performed at [facility], [address], [place], [place] [address]  Ammonia     Status: None   Collection Time: 03/04/21  6:43 PM  Result Value Ref Range   Ammonia 26 9 - 35 umol/L    Comment: Performed at [facility], [address] [place], [place] [address]  Hepatitis panel, acute     Status: None   Collection Time: 03/04/21  6:43 PM  Result Value Ref Range   Hepatitis B Surface Ag NON  REACTIVE NON REACTIVE   HCV Ab NON REACTIVE NON REACTIVE    Comment: (NOTE) Nonreactive HCV antibody screen is consistent with no HCV infections,  unless recent infection is suspected or other evidence exists to indicate HCV infection.     Hep A IgM NON REACTIVE NON REACTIVE   Hep B C IgM NON REACTIVE NON REACTIVE    Comment: Performed at [facility], [address], [place], [place] [address]  Urinalysis, Routine w reflex microscopic Urine, Catheterized     Status: Abnormal   Collection Time: 03/05/21  4:14 AM  Result Value Ref Range   Color, Urine YELLOW YELLOW   APPearance CLEAR CLEAR   Specific Gravity, Urine 1.010 1.005 - 1.030   pH 5.0 5.0 - 8.0   Glucose, UA NEGATIVE NEGATIVE mg/dL   Hgb urine dipstick NEGATIVE NEGATIVE   Bilirubin Urine NEGATIVE NEGATIVE   Ketones, ur 20 (A) NEGATIVE mg/dL   Protein, ur NEGATIVE NEGATIVE mg/dL   Nitrite NEGATIVE NEGATIVE   Leukocytes,Ua NEGATIVE NEGATIVE    Comment: Performed at [facility], [address] [place], [place] [address]  Urine rapid drug screen (hosp performed)  Status: None   Collection Time: 03/05/21  4:14 AM  Result Value Ref Range   Opiates NONE DETECTED NONE DETECTED   Cocaine NONE DETECTED NONE DETECTED   Benzodiazepines NONE DETECTED NONE DETECTED   Amphetamines NONE DETECTED NONE DETECTED   Tetrahydrocannabinol NONE DETECTED NONE DETECTED   Barbiturates NONE DETECTED NONE DETECTED    Comment: (NOTE) DRUG SCREEN FOR MEDICAL PURPOSES ONLY.  IF CONFIRMATION IS NEEDED FOR ANY PURPOSE, NOTIFY LAB WITHIN 5 DAYS.  LOWEST DETECTABLE LIMITS FOR URINE DRUG SCREEN Drug Class                     Cutoff (ng/mL) Amphetamine and metabolites    1000 Barbiturate and metabolites    200 Benzodiazepine                 200 Tricyclics and metabolites     300 Opiates and metabolites        300 Cocaine and metabolites        300 THC                            50 Performed at [facility], [address], [place], [place] [address]   Culture, Urine     Status: None   Collection Time: 03/05/21  4:14 AM   Specimen: Urine, Clean Catch  Result Value Ref Range   Specimen Description      URINE, CLEAN CATCH Performed at [facility] [address] [place], [place] [address]    Special Requests      NONE Performed at [facility], [address] [place], [place] [address]    Culture      NO GROWTH Performed at [facility], [address] [place], [place] [address]    Report Status 03/06/2021 FINAL   Sodium, urine, random     Status: None   Collection Time: 03/05/21  4:14 AM  Result Value Ref Range   Sodium, Ur 59 mmol/L    Comment: Performed at [facility], [address], [place], [place] [address]  Creatinine, urine, random     Status: None   Collection Time: 03/05/21  4:14 AM  Result Value Ref Range   Creatinine, Urine 101.49 mg/dL    Comment: Performed at [facility], [address] [place], [place] [address]  Magnesium     Status: None   Collection Time: 03/05/21  4:52 AM  Result Value Ref Range   Magnesium 2.2 1.7 - 2.4 mg/dL    Comment: Performed at [facility], [address], [place], [place] [address]  Phosphorus     Status: None   Collection Time: 03/05/21  4:52 AM  Result Value Ref Range   Phosphorus 2.8 2.5 - 4.6 mg/dL    Comment: Performed at [facility], [address] [place], [place] [address]  Comprehensive metabolic panel     Status: Abnormal   Collection Time: 03/05/21  4:52 AM  Result Value Ref Range   Sodium 139 135 - 145 mmol/L   Potassium 3.4 (L) 3.5 - 5.1 mmol/L    Comment: DELTA CHECK NOTED NO VISIBLE HEMOLYSIS    Chloride 104 98 - 111 mmol/L   CO2 25 22 - 32 mmol/L   Glucose, Bld 58 (L) 70 - 99 mg/dL    Comment: Glucose reference range applies only to samples  taken after fasting for at least  8 hours.   BUN 15 6 - 20 mg/dL   Creatinine, Ser 7.56 (H) 0.44 - 1.00 mg/dL   Calcium 7.5 (L) 8.9 - 10.3 mg/dL   Total Protein 6.1 (L) 6.5 - 8.1 g/dL   Albumin 3.2 (L) 3.5 - 5.0 g/dL   AST 92 (H) 15 - 41 U/L   ALT 42 0 - 44 U/L   Alkaline Phosphatase 58 38 - 126 U/L   Total Bilirubin 0.5 0.3 - 1.2 mg/dL   GFR, Estimated 56 (L) >60 mL/min    Comment: (NOTE) Calculated using the CKD-EPI Creatinine Equation (2021)    Anion gap 10 5 - 15    Comment: Performed at [facility], [address] [place], [place] [address]  CBC     Status: Abnormal   Collection Time: 03/05/21  4:52 AM  Result Value Ref Range   WBC 3.5 (L) 4.0 - 10.5 K/uL   RBC 4.05 3.87 - 5.11 MIL/uL   Hemoglobin 12.0 12.0 - 15.0 g/dL   HCT 51.8 84.1 - 66.0 %   MCV 92.8 80.0 - 100.0 fL   MCH 29.6 26.0 - 34.0 pg   MCHC 31.9 30.0 - 36.0 g/dL   RDW 63.0 16.0 - 10.9 %   Platelets 200 150 - 400 K/uL   nRBC 0.0 0.0 - 0.2 %    Comment: Performed at [facility], [address] [place], [place] [address]  Glucose, capillary     Status: Abnormal   Collection Time: 03/05/21  6:46 AM  Result Value Ref Range   Glucose-Capillary 49 (L) 70 - 99 mg/dL    Comment: Glucose reference range applies only to samples taken after fasting for at least 8 hours.  Glucose, capillary     Status: Abnormal   Collection Time: 03/05/21  6:54 AM  Result Value Ref Range   Glucose-Capillary 50 (L) 70 - 99 mg/dL    Comment: Glucose reference range applies only to samples taken after fasting for at least 8 hours.  Glucose, capillary     Status: Abnormal   Collection Time: 03/05/21  7:16 AM  Result Value Ref Range   Glucose-Capillary 66 (L) 70 - 99 mg/dL    Comment: Glucose reference range applies only to samples taken after fasting for at least 8 hours.  Glucose, capillary     Status: Abnormal   Collection Time: 03/05/21  7:43 AM  Result Value Ref Range   Glucose-Capillary 110 (H) 70 - 99 mg/dL    Comment:  Glucose reference range applies only to samples taken after fasting for at least 8 hours.  Glucose, capillary     Status: Abnormal   Collection Time: 03/05/21  6:05 PM  Result Value Ref Range   Glucose-Capillary 108 (H) 70 - 99 mg/dL    Comment: Glucose reference range applies only to samples taken after fasting for at least 8 hours.  Glucose, capillary     Status: Abnormal   Collection Time: 03/05/21  9:07 PM  Result Value Ref Range   Glucose-Capillary 106 (H) 70 - 99 mg/dL    Comment: Glucose reference range applies only to samples taken after fasting for at least 8 hours.  Valproic acid level     Status: Abnormal   Collection Time: 03/06/21  5:01 AM  Result Value Ref Range   Valproic Acid Lvl 132 (H) 50.0 - 100.0 ug/mL    Comment: Performed at [facility],  [address] [place], [place] [address]  Comprehensive metabolic panel     Status: Abnormal   Collection Time: 03/06/21  5:01 AM  Result Value Ref Range   Sodium 143 135 - 145 mmol/L   Potassium 3.5 3.5 - 5.1 mmol/L   Chloride 110 98 - 111 mmol/L   CO2 26 22 - 32 mmol/L   Glucose, Bld 93 70 - 99 mg/dL    Comment: Glucose reference range applies only to samples taken after fasting for at least 8 hours.   BUN 7 6 - 20 mg/dL   Creatinine, Ser 6.04 0.44 - 1.00 mg/dL   Calcium 7.8 (L) 8.9 - 10.3 mg/dL   Total Protein 6.0 (L) 6.5 - 8.1 g/dL   Albumin 3.0 (L) 3.5 - 5.0 g/dL   AST 59 (H) 15 - 41 U/L   ALT 36 0 - 44 U/L   Alkaline Phosphatase 59 38 - 126 U/L   Total Bilirubin 0.3 0.3 - 1.2 mg/dL   GFR, Estimated >54 >09 mL/min    Comment: (NOTE) Calculated using the CKD-EPI Creatinine Equation (2021)    Anion gap 7 5 - 15    Comment: Performed at [facility], [address] [place], [place] [address]  CBC     Status: None   Collection Time: 03/06/21  5:01 AM  Result Value Ref Range   WBC 4.0 4.0 - 10.5 K/uL   RBC 4.25 3.87 - 5.11 MIL/uL   Hemoglobin 12.7 12.0 - 15.0 g/dL   HCT 47.8  29.5 - 62.1 %   MCV 92.9 80.0 - 100.0 fL   MCH 29.9 26.0 - 34.0 pg   MCHC 32.2 30.0 - 36.0 g/dL   RDW 30.8 65.7 - 84.6 %   Platelets 254 150 - 400 K/uL   nRBC 0.0 0.0 - 0.2 %    Comment: Performed at [facility], [address] [place], [place] [address]  Magnesium     Status: None   Collection Time: 03/06/21  5:01 AM  Result Value Ref Range   Magnesium 2.2 1.7 - 2.4 mg/dL    Comment: Performed at [facility], [address], [place], [place] [address]  Phosphorus     Status: Abnormal   Collection Time: 03/06/21  5:01 AM  Result Value Ref Range   Phosphorus 2.3 (L) 2.5 - 4.6 mg/dL    Comment: Performed at [facility], [address] [place], [place] [address]  Glucose, capillary     Status: None   Collection Time: 03/06/21  7:33 AM  Result Value Ref Range   Glucose-Capillary 92 70 - 99 mg/dL    Comment: Glucose reference range applies only to samples taken after fasting for at least 8 hours.   Comment 1 Notify RN    Comment 2 Document in Chart     Current Facility-Administered Medications  Medication Dose Route Frequency Provider Last Rate Last Admin   acetaminophen (TYLENOL) tablet 650 mg  650 mg Oral Q6H PRN Rodolph Bong, MD       Or   acetaminophen (TYLENOL) suppository 650 mg  650 mg Rectal Q6H PRN Rodolph Bong, MD       albuterol (PROVENTIL) (2.5 MG/3ML) 0.083% nebulizer solution 3 mL  3 mL Inhalation Q4H PRN Rodolph Bong, MD       benztropine (COGENTIN) tablet 1 mg  1 mg Oral BID Rodolph Bong, MD   1 mg at 03/06/21 1019  calcium-vitamin D (OSCAL WITH D) 500-200 MG-UNIT per tablet 2 tablet  2 tablet Oral TID Rodolph Bonghompson, Daniel V, MD   2 tablet at 03/06/21 1019   clonazePAM (KLONOPIN) tablet 0.5 mg  0.5 mg Oral BID Rodolph Bonghompson, Daniel V, MD   0.5 mg at 03/06/21 1019   dextrose 5 %-0.9 % sodium chloride infusion   Intravenous Continuous Rodolph Bonghompson, Daniel V, MD 125 mL/hr at 03/06/21 [id number] Infusion  Verify at 03/06/21 0620   divalproex (DEPAKOTE) DR tablet 500 mg  500 mg Oral BID Rodolph Bonghompson, Daniel V, MD   500 mg at 03/06/21 1020   enoxaparin (LOVENOX) injection 40 mg  40 mg Subcutaneous Q24H Rodolph Bonghompson, Daniel V, MD   40 mg at 03/05/21 2125   feeding supplement (ENSURE ENLIVE / ENSURE PLUS) liquid 237 mL  237 mL Oral BID BM Rodolph Bonghompson, Daniel V, MD   237 mL at 03/06/21 1020   fluticasone (FLONASE) 50 MCG/ACT nasal spray 2 spray  2 spray Each Nare Daily Rodolph Bonghompson, Daniel V, MD   2 spray at 03/06/21 1020   folic acid (FOLVITE) tablet 1 mg  1 mg Oral Daily Rodolph Bonghompson, Daniel V, MD   1 mg at 03/06/21 1019   multivitamin with minerals tablet 1 tablet  1 tablet Oral Daily Rodolph Bonghompson, Daniel V, MD   1 tablet at 03/06/21 1019   nicotine (NICODERM CQ - dosed in mg/24 hours) patch 21 mg  21 mg Transdermal Daily Rodolph Bonghompson, Daniel V, MD   21 mg at 03/06/21 1019   OLANZapine zydis (ZYPREXA) disintegrating tablet 2.5 mg  2.5 mg Oral BID Maryagnes AmosStarkes-Perry, Tsion Inghram S, FNP   2.5 mg at 03/06/21 1020   ondansetron (ZOFRAN) tablet 4 mg  4 mg Oral Q6H PRN Rodolph Bonghompson, Daniel V, MD       Or   ondansetron [facility](ZOFRAN) injection 4 mg  4 mg Intravenous Q6H PRN Rodolph Bonghompson, Daniel V, MD       [START ON 03/07/2021] pantoprazole (PROTONIX) EC tablet 40 mg  40 mg Oral Daily Rexford Mausucker, Mary-Ashlyn, RPH       phosphorus (K PHOS NEUTRAL) tablet 250 mg  250 mg Oral BID Rodolph Bonghompson, Daniel V, MD   250 mg at 03/06/21 1020   thiamine tablet 100 mg  100 mg Oral Daily Rodolph Bonghompson, Daniel V, MD   100 mg at 03/06/21 1019   umeclidinium-vilanterol (ANORO ELLIPTA) 62.5-25 MCG/INH 1 puff  1 puff Inhalation Daily Rodolph Bonghompson, Daniel V, MD   1 puff at 03/06/21 0717   ziprasidone (GEODON) capsule 20 mg  20 mg Oral BID WC Maryagnes AmosStarkes-Perry, Damontre Millea S, FNP   20 mg at 03/06/21 [id number]    Musculoskeletal: Strength & Muscle Tone: within normal limits Gait & Station: normal Patient leans: N/A            Psychiatric Specialty Exam:  Presentation  General Appearance:  Disheveled; Other (comment) (hair matted, big flakes in her hair. Lips dry and chafing)  Eye Contact:Fair  Speech:Clear and Coherent; Slow  Speech Volume:Normal  Handedness:Right   Mood and Affect  Mood:Euphoric  Affect:Blunt   Thought Process  Thought Processes:Irrevelant; Coherent; Linear  Descriptions of Associations:Loose  Orientation:Partial  Thought Content:Perseveration; Delusions  History of Schizophrenia/Schizoaffective disorder:Yes  Duration of Psychotic Symptoms:Greater than six months  Hallucinations:Hallucinations: None; Other (comment) (patient denies, nursing staff at bedside states she is having AVH)  Ideas of Reference:Delusions  Suicidal Thoughts:Suicidal Thoughts: No  Homicidal Thoughts:Homicidal Thoughts: No   Sensorium  Memory:Immediate Poor; Recent Poor; Remote Poor  Judgment:Impaired  Insight:Shallow   Executive  Functions  Concentration:Fair  Attention Span:Fair  Recall:Fair  Fund of Knowledge:Fair  Language:Fair   Psychomotor Activity  Psychomotor Activity:Psychomotor Activity: Normal   Assets  Assets:Communication Skills; Desire for Improvement; Leisure Time; Physical Health   Sleep  Sleep:Sleep: Fair   Physical Exam: Physical Exam Constitutional:      Appearance: She is morbidly obese. She is ill-appearing.  Neurological:     Mental Status: She is easily aroused. She is disoriented and confused.     Motor: Weakness and tremor present.  Psychiatric:        Behavior: Behavior is cooperative.   Review of Systems  Neurological:  Positive for tremors and weakness.  Psychiatric/Behavioral:  Positive for hallucinations. The patient has insomnia.   All other systems reviewed and are negative. Blood pressure 138/71, pulse 69, temperature 98.8 F (37.1 C), temperature source Oral, resp. rate 18, height  (1.575 m), weight 78.1 kg, SpO2 97 %. Body mass index is 31.49 kg/m.  Treatment Plan Summary: Plan   We  will continue olanzapine 2.5 mg p.o. daily to further target psychosis.  We will also continue ziprasidone 20 mg p.o. twice daily, with the goal to discontinue entirely. -Patient Depakote level (132), will dc Depakote at this time and repeat level on 03/08/2021.  -Recommend neuro evaluation, as daughter reports patient with new onset of psychosis and hallucinations approximately 4 years ago.  Patient may also benefit from brain MRI to further assess any organic causes of psychosis. Due to apparent decompensate in schizoaffective, patient will benefit from inpatient psychiatric admission once medically stable.  Disposition: Recommend psychiatric Inpatient admission when medically cleared.  Maryagnes Amos, FNP 03/06/2021 11:16 AM

## 2021-03-06 NOTE — Evaluation (Signed)
Physical Therapy Evaluation Patient Details Name: Michaela Wells MRN: [id number] DOB: June 10, 1961 Today's Date: 03/06/2021   History of Present Illness  patient is a 60 year old female who presented to the emergency room after a fall at home down stairs. patient was found to have severe hypokalemia, acute kidney injury,acute metabolic encephalopathy, and transaminitis. patient has been noted to have multiple hallucinations during admission. PPI:RJJOACZ 1 disorder, PTSD, schizoaffective disorder, COPD recently hospitalized (7/4-7/5) for hypokalemia and acute kidney injury.  Clinical Impression  Patient is quite restless and impulsive, hallucinating cats and children.   Patient did ambulate with Rw and some steps without Rw. Pt admitted with above diagnosis.  Pt currently with functional limitations due to the deficits listed below (see PT Problem List). Pt will benefit from skilled PT to increase their independence and safety with mobility to allow discharge to the venue listed below.       Follow Up Recommendations  (Behavior health facility)    Equipment Recommendations  None recommended by PT    Recommendations for Other Services       Precautions / Restrictions Precautions Precautions: Fall Restrictions Weight Bearing Restrictions: No      Mobility  Bed Mobility Overal bed mobility: Needs Assistance Bed Mobility: Supine to Sit;Sit to Supine     Supine to sit: Supervision Sit to supine: Supervision   General bed mobility comments: for safety    Transfers Overall transfer level: Needs assistance Equipment used: Rolling walker (2 wheeled) Transfers: Sit to/from [employer] Sit to Stand: Min guard Stand pivot transfers: Min guard       General transfer comment: cues for safety  Ambulation/Gait Ambulation/Gait assistance: Min guard Gait Distance (Feet): 20 Feet (x2) Assistive device: Rolling walker (2 wheeled) Gait Pattern/deviations: Step-to  pattern;Step-through pattern     General Gait Details: bumping into bed and wall  Stairs            Wheelchair Mobility    Modified Rankin (Stroke Patients Only)       Balance Overall balance assessment: Needs assistance;History of Falls   Sitting balance-Leahy Scale: Good Sitting balance - Comments: noted to have tremor like movements of BUE when using and at rest.     Standing balance-Leahy Scale: Fair                               Pertinent Vitals/Pain Pain Assessment: No/denies pain    Home Living Family/patient expects to be discharged to:: Private residence Living Arrangements: Children Available Help at Discharge: Family Type of Home: House           Additional Comments: may DC to [facility]    Prior Function Level of Independence: Independent with assistive device(s)         Comments: patient reported being independent in dressing, bathing and toileting tasks with daughter support fro medications, cooking cleaning and IADLs.     Hand Dominance   Dominant Hand: Right    Extremity/Trunk Assessment   Upper Extremity Assessment Upper Extremity Assessment: Generalized weakness (shakiness in hands)    Lower Extremity Assessment Lower Extremity Assessment: Generalized weakness    Cervical / Trunk Assessment Cervical / Trunk Assessment: Normal  Communication   Communication: No difficulties  Cognition Arousal/Alertness: Awake/alert Behavior During Therapy: Anxious;Restless;Impulsive Overall Cognitive Status: Difficult to assess  General Comments: patient was noted to be impulsive, hallucinating cats and children.      General Comments      Exercises     Assessment/Plan    PT Assessment Patient needs continued PT services  PT Problem List Decreased strength;Decreased balance;Decreased cognition;Decreased mobility;Decreased activity tolerance;Decreased safety awareness       PT  Treatment Interventions      PT Goals (Current goals can be found in the Care Plan section)  Acute Rehab PT Goals Patient Stated Goal: wants to go home PT Goal Formulation: With patient    Frequency Min 2X/week   Barriers to discharge Decreased caregiver support      Co-evaluation               AM-PAC PT "6 Clicks" Mobility  Outcome Measure Help needed turning from your back to your side while in a flat bed without using bedrails?: A Little Help needed moving from lying on your back to sitting on the side of a flat bed without using bedrails?: A Little Help needed moving to and from a bed to a chair (including a wheelchair)?: A Little Help needed standing up from a chair using your arms (e.g., wheelchair or bedside chair)?: A Little Help needed to walk in hospital room?: A Little Help needed climbing 3-5 steps with a railing? : A Lot 6 Click Score: 17    End of Session Equipment Utilized During Treatment: Gait belt Activity Tolerance: Patient tolerated treatment well Patient left: in bed;with bed alarm set;with call bell/phone within reach Nurse Communication: Mobility status PT Visit Diagnosis: Unsteadiness on feet (R26.81);Difficulty in walking, not elsewhere classified (R26.2)    Time: 9562-1308 PT Time Calculation (min) (ACUTE ONLY): 18 min   Charges:   PT Evaluation $PT Eval Low Complexity: 1 Low          Blanchard Kelch PT Acute Rehabilitation Services Pager [phone] Office [phone]   Rada Hay 03/06/2021, 1:20 PM

## 2021-03-06 NOTE — Progress Notes (Addendum)
PROGRESS NOTE    Michaela Wells  LKG:[id number] DOB: [birth year] DOA: 03/04/2021 PCP: [name], NP    Chief Complaint  Patient presents with   Altered Mental Status    Brief Narrative:  Patient 60 year old female history of bipolar 1 disorder, PTSD, schizoaffective disorder, COPD recently hospitalized (7/4-7/5) for hypokalemia and acute kidney injury during the hospitalization noted to have hallucinations who presents back to the ED after a fall.  It is noted that patient is having both auditory and visual hallucinations which have worsened recently per daughter with some associated decreased oral intake and a 20 pound weight loss over the past month.  Infectious work-up done negative.  Comprehensive metabolic profile noted patient to be hypokalemic in acute kidney injury.  SARS coronavirus 2 PCR done negative.  Patient admitted for electrolyte abnormalities, acute kidney injury, auditory and visual hallucinations.  Psychiatry consulted.   Assessment & Plan:   Principal Problem:   Hypokalemia Active Problems:   COPD (chronic obstructive pulmonary disease) (HCC)   GAD (generalized anxiety disorder)   GERD (gastroesophageal reflux disease)   Bipolar I disorder, current or most recent episode depressed, with psychotic features (HCC)   PTSD (post-traumatic stress disorder)   MDD (major depressive disorder)   AKI (acute kidney injury) (HCC)   Dehydration   Transaminitis   Auditory hallucination   Visual hallucinations   Acute metabolic encephalopathy  1 severe hypokalemia -Likely secondary to poor oral intake with dehydration in the setting of diuretics of HCTZ.   Per pharmacy it was noted that patient's bottle of HCTZ was found to be empty at home per family however noted to have a significant amount of pills and that 3 days prior to admission.   -Patient recently hospitalized with severe hypokalemia, potassium repleted and patient discharged. -Potassium repleted currently  at 3.5. -Phosphorus at 2.3. -Magnesium at 2.2. -HCTZ discontinued and will not be resumed on discharge.  2.  Acute kidney injury -Likely secondary to prerenal azotemia in the setting of diuretics. -Per family patient has had significant poor oral intake with a 20 pound weight loss over the past month. -Patient with hallucinations and concerned someone may be trying to poison her and likely leading to patient's poor oral intake. -Urinalysis bland.   -Renal function improved with hydration.   -Follow.  3.  Dehydration -IV fluids.  4.  Acute metabolic encephalopathy -Patient noted to be lethargic and drowsy but arousable on presentation.  Patient also per family with worsening auditory and visual hallucinations. -Patient more alert however still with auditory and visual hallucinations.  Answering some questions.  -Infectious work-up negative to date. -CT head negative for any acute abnormalities. -Ammonia levels within normal limits. -Continue to hold BuSpar and hydroxyzine. -Continue Klonopin. -Depakote has been discontinued. -Patient started on Zyprexa by psychiatry. -Geodon dose decreased per psychiatry. -Psychiatry recommending inpatient psychiatric admission once patient is medically stable.  5.  Bipolar disorder/schizoaffective disorder/auditory and visual hallucinations/MDD -During last hospitalization was noted that patient Cymbalta was discontinued. -Per family patient with a history of auditory and visual hallucinations over the past 3 years however over the past month hallucinations have worsened. -Patient with no signs of infection. -Urinalysis nitrite negative, leukocytes negative.   -Patient noted to be on both BuSpar and Klonopin. -BuSpar and hydroxyzine on hold.   -Patient seen in consultation by psychiatry who have decreased patient's Geodon to 20 mg twice a day, patient started on Zyprexa 2.5 mg twice daily to target psychosis and goal is to discontinue  Geodon  entirely after a safe transition with titration of Zyprexa.   -Psychiatry recommended inpatient psychiatric admission once patient is medically stable. -Continue Klonopin. -Depakote has been discontinued. -Per psychiatry.   6.  COPD -Stable. -Continue home regimen of Anoro Ellipta, albuterol MDI.  7.  Tobacco abuse -Nicotine patch.   8.  Transaminitis -Likely secondary to dehydration.   -Acute hepatitis panel negative.   -Renal ultrasound consistent with cholelithiasis without cholecystitis.   -Patient currently asymptomatic without any abdominal pain.   -LFTs trending down. -Continue IV fluids, supportive care.   -Outpatient follow-up.    9. GERD Continue PPI  10.?  Odynophagia -Patient noted to have some complaints that when she swallows she feels like a knife is sticking in her.  Patient seen by speech therapy with questions of esophageal dysmotility.  Check a barium esophagram.  Continue current dysphagia 3 diet with thin liquids as recommended by speech therapy. -PPI. -If barium esophagram is abnormal will need GI input.   DVT prophylaxis: Lovenox Code Status: Full Family Communication: No family at bedside. Disposition:   Status is: Inpatient  Remains inpatient appropriate because:Inpatient level of care appropriate due to severity of illness  Dispo: The patient is from: Home              Anticipated d/c is to:  TBD              Patient currently is not medically stable to d/c.   Difficult to place patient No       Consultants:  Psychiatry: Chauncey Mann, FNP 03/05/2021  Procedures:  CT head CT C-spine 03/04/2021 Chest x-ray 03/04/2021 Abdominal ultrasound 03/05/2021 Barium esophagram pending 03/06/2021    Antimicrobials:  None   Subjective: Still with auditory and visual hallucinations. No CP. No SOB.  Patient states when she swallows food feels like a knife is sticking in her.  Objective: Vitals:   03/05/21 1515 03/05/21 2101 03/06/21 0504  03/06/21 0718  BP: 110/63 (!) 124/102 138/71   Pulse: 66 70 69   Resp: Temp: 97.9 F (36.6 C) 97.9 F (36.6 C) 98.8 F (37.1 C)   TempSrc:  Oral Oral   SpO2: 94% 91% 97% 97%  Weight:      Height:        Intake/Output Summary (Last 24 hours) at 03/06/2021 1131 Last data filed at 03/06/2021 6295 Gross per 24 hour  Intake 2892.37 ml  Output 1250 ml  Net 1642.37 ml    Filed Weights   03/04/21 1232 03/04/21 1814  Weight: 76.3 kg 78.1 kg    Examination:  General exam: NAD Respiratory system: CTA B.  No wheezes, no crackles, no rhonchi.  Normal respiratory effort.   Cardiovascular system: RRR no murmurs rubs or gallops.  No JVD.  No lower extremity edema.   Gastrointestinal system: Abdomen is soft, nontender, nondistended, positive bowel sounds.  No rebound.  No guarding.   Central nervous system: Alert and oriented x2.  Moving extremities spontaneously.   Extremities: Symmetric 5 x 5 power. Skin: No rashes, lesions or ulcers Psychiatry: Judgement and insight appear poor.  Visual and auditory hallucinations.  Mood & affect flat.     Data Reviewed: I have personally reviewed following labs and imaging studies  CBC: Recent Labs  Lab 03/04/21 1410 03/05/21 0452 03/06/21 0501  WBC 5.3 3.5* 4.0  NEUTROABS 3.0  --   --   HGB 12.3 12.0 12.7  HCT 36.6 37.6 39.5  MCV  89.7 92.8 92.9  PLT 202 200 254     Basic Metabolic Panel: Recent Labs  Lab 03/04/21 1410 03/04/21 1843 03/05/21 0452 03/06/21 0501  NA 139  --  139 143  K 2.4*  --  3.4* 3.5  CL 102  --  104 110  CO2 26  --  25 26  GLUCOSE 83  --  58* 93  BUN 20  --  15 7  CREATININE 1.47*  --  1.12* 1.00  CALCIUM 7.3*  --  7.5* 7.8*  MG  --  2.3 2.2 2.2  PHOS  --   --  2.8 2.3*     GFR: Estimated Creatinine Clearance: 57.9 mL/min (by C-G formula based on SCr of 1 mg/dL).  Liver Function Tests: Recent Labs  Lab 03/04/21 1410 03/05/21 0452 03/06/21 0501  AST 112* 92* 59*  ALT 46* 42 36   ALKPHOS 50 58 59  BILITOT 0.6 0.5 0.3  PROT 5.7* 6.1* 6.0*  ALBUMIN 2.8* 3.2* 3.0*     CBG: Recent Labs  Lab 03/05/21 0716 03/05/21 0743 03/05/21 1805 03/05/21 2107 03/06/21 0733  GLUCAP 66* 110* 108* 106* 92      Recent Results (from the past 240 hour(s))  SARS CORONAVIRUS 2 (TAT 6-24 HRS) Nasopharyngeal Nasopharyngeal Swab     Status: None   Collection Time: 02/26/21  2:55 PM   Specimen: Nasopharyngeal Swab  Result Value Ref Range Status   SARS Coronavirus 2 NEGATIVE NEGATIVE Final    Comment: (NOTE) SARS-CoV-2 target nucleic acids are NOT DETECTED.  The SARS-CoV-2 RNA is generally detectable in upper and lower respiratory specimens during the acute phase of infection. Negative results do not preclude SARS-CoV-2 infection, do not rule out co-infections with other pathogens, and should not be used as the sole basis for treatment or other patient management decisions. Negative results must be combined with clinical observations, patient history, and epidemiological information. The expected result is Negative.  Fact Sheet for Patients: HairSlick.no  Fact Sheet for Healthcare Providers: quierodirigir.com  This test is not yet approved or cleared by the [place] FDA and  has been authorized for detection and/or diagnosis of SARS-CoV-2 by FDA under an Emergency Use Authorization (EUA). This EUA will remain  in effect (meaning this test can be used) for the duration of the COVID-19 declaration under Se ction 564(b)(1) of the Act, 21 U.S.C. section 360bbb-3(b)(1), unless the authorization is terminated or revoked sooner.  Performed at [facility], [address] [place], [place] [address]   Resp Panel by RT-PCR (Flu A&B, Covid) Nasopharyngeal Swab     Status: None   Collection Time: 03/04/21  2:28 PM   Specimen: Nasopharyngeal Swab; Nasopharyngeal(NP) swabs in vial transport medium  Result Value  Ref Range Status   SARS Coronavirus 2 by RT PCR NEGATIVE NEGATIVE Final    Comment: (NOTE) SARS-CoV-2 target nucleic acids are NOT DETECTED.  The SARS-CoV-2 RNA is generally detectable in upper respiratory specimens during the acute phase of infection. The lowest concentration of SARS-CoV-2 viral copies this assay can detect is 138 copies/mL. A negative result does not preclude SARS-Cov-2 infection and should not be used as the sole basis for treatment or other patient management decisions. A negative result may occur with  improper specimen collection/handling, submission of specimen other than nasopharyngeal swab, presence of viral mutation(s) within the areas targeted by this assay, and inadequate number of viral copies(<138 copies/mL). A negative result must be combined with clinical observations, patient history,  and epidemiological information. The expected result is Negative.  Fact Sheet for Patients:  BloggerCourse.comhttps://www.fda.gov/media/152166/download  Fact Sheet for Healthcare Providers:  SeriousBroker.ithttps://www.fda.gov/media/152162/download  This test is no t yet approved or cleared by the [place] FDA and  has been authorized for detection and/or diagnosis of SARS-CoV-2 by FDA under an Emergency Use Authorization (EUA). This EUA will remain  in effect (meaning this test can be used) for the duration of the COVID-19 declaration under Section 564(b)(1) of the Act, 21 U.S.C.section 360bbb-3(b)(1), unless the authorization is terminated  or revoked sooner.       Influenza A by PCR NEGATIVE NEGATIVE Final   Influenza B by PCR NEGATIVE NEGATIVE Final    Comment: (NOTE) The Xpert Xpress SARS-CoV-2/FLU/RSV plus assay is intended as an aid in the diagnosis of influenza from Nasopharyngeal swab specimens and should not be used as a sole basis for treatment. Nasal washings and aspirates are unacceptable for Xpert Xpress SARS-CoV-2/FLU/RSV testing.  Fact Sheet for  Patients: BloggerCourse.comhttps://www.fda.gov/media/152166/download  Fact Sheet for Healthcare Providers: SeriousBroker.ithttps://www.fda.gov/media/152162/download  This test is not yet approved or cleared by the [place] FDA and has been authorized for detection and/or diagnosis of SARS-CoV-2 by FDA under an Emergency Use Authorization (EUA). This EUA will remain in effect (meaning this test can be used) for the duration of the COVID-19 declaration under Section 564(b)(1) of the Act, 21 U.S.C. section 360bbb-3(b)(1), unless the authorization is terminated or revoked.  Performed at [facility], [address] [place], [place] [address]   Culture, Urine     Status: None   Collection Time: 03/05/21  4:14 AM   Specimen: Urine, Clean Catch  Result Value Ref Range Status   Specimen Description   Final    URINE, CLEAN CATCH Performed at [facility], [address] [place], [place] [address]    Special Requests   Final    NONE Performed at [facility], [address] [place], [place] [address]    Culture   Final    NO GROWTH Performed at [facility], [address] [place], [place] [address]    Report Status 03/06/2021 FINAL  Final          Radiology Studies: CT Head Wo Contrast  Result Date: 03/04/2021 CLINICAL DATA:  60 year old female with fall, confusion and altered mental status. EXAM: CT HEAD WITHOUT CONTRAST CT CERVICAL SPINE WITHOUT CONTRAST TECHNIQUE: Multidetector CT imaging of the head and cervical spine was performed following the standard protocol without intravenous contrast. Multiplanar CT image reconstructions of the cervical spine were also generated. COMPARISON:  02/26/2021 and prior studies FINDINGS: CT HEAD FINDINGS Brain: No evidence of acute infarction, hemorrhage, hydrocephalus, extra-axial collection or mass lesion/mass effect. White matter hypodensities are unchanged. Vascular: No hyperdense vessel or unexpected  calcification. Skull: Normal. Negative for fracture or focal lesion. Sinuses/Orbits: A LEFT mastoid effusion is again noted. Other: None CT CERVICAL SPINE FINDINGS Alignment: Normal. Skull base and vertebrae: No acute fracture. No primary bone lesion or focal pathologic process. Soft tissues and spinal canal: No prevertebral fluid or swelling. No visible canal hematoma. Disc levels: Mild multilevel degenerative disc disease/spondylosis and moderate multilevel facet arthropathy again noted. Upper chest: No acute abnormality Other: None IMPRESSION: 1. No evidence of acute intracranial abnormality. Chronic small-vessel white matter ischemic changes. 2. No static evidence of acute injury to the cervical spine. Electronically Signed   By: Harmon PierJeffrey  Hu M.D.   On: 03/04/2021 13:35   CT Cervical  Spine Wo Contrast  Result Date: 03/04/2021 CLINICAL DATA:  60 year old female with fall, confusion and altered mental status. EXAM: CT HEAD WITHOUT CONTRAST CT CERVICAL SPINE WITHOUT CONTRAST TECHNIQUE: Multidetector CT imaging of the head and cervical spine was performed following the standard protocol without intravenous contrast. Multiplanar CT image reconstructions of the cervical spine were also generated. COMPARISON:  02/26/2021 and prior studies FINDINGS: CT HEAD FINDINGS Brain: No evidence of acute infarction, hemorrhage, hydrocephalus, extra-axial collection or mass lesion/mass effect. White matter hypodensities are unchanged. Vascular: No hyperdense vessel or unexpected calcification. Skull: Normal. Negative for fracture or focal lesion. Sinuses/Orbits: A LEFT mastoid effusion is again noted. Other: None CT CERVICAL SPINE FINDINGS Alignment: Normal. Skull base and vertebrae: No acute fracture. No primary bone lesion or focal pathologic process. Soft tissues and spinal canal: No prevertebral fluid or swelling. No visible canal hematoma. Disc levels: Mild multilevel degenerative disc disease/spondylosis and moderate  multilevel facet arthropathy again noted. Upper chest: No acute abnormality Other: None IMPRESSION: 1. No evidence of acute intracranial abnormality. Chronic small-vessel white matter ischemic changes. 2. No static evidence of acute injury to the cervical spine. Electronically Signed   By: Harmon Pier M.D.   On: 03/04/2021 13:35   US Abdomen Complete  Result Date: 03/05/2021 CLINICAL DATA:  Transaminitis EXAM: ABDOMEN ULTRASOUND COMPLETE COMPARISON:  None. FINDINGS: Gallbladder: Large amount of sludge within the gallbladder. Probable small stones also noted in the gallbladder. No wall thickening or sonographic Murphy's sign. Common bile duct: Diameter: Normal caliber, 3 mm Liver: No focal lesion identified. Within normal limits in parenchymal echogenicity. Portal vein is patent on color Doppler imaging with normal direction of blood flow towards the liver. IVC: No abnormality visualized. Pancreas: Visualized portion unremarkable. Spleen: Size and appearance within normal limits. Right Kidney: Length: 9.9 cm. Echogenicity within normal limits. No mass or hydronephrosis visualized. Left Kidney: Length: 9.8 cm. Echogenicity within normal limits. No mass or hydronephrosis visualized. Abdominal aorta: No aneurysm visualized. Other findings: None. IMPRESSION: Sludge and stones noted within the gallbladder. No sonographic evidence of acute cholecystitis. Electronically Signed   By: Charlett Nose M.D.   On: 03/05/2021 08:16   DG Chest Portable 1 View  Result Date: 03/04/2021 CLINICAL DATA:  Altered mental status EXAM: PORTABLE CHEST 1 VIEW COMPARISON:  02/26/2021 FINDINGS: This is a low volume film with mild bibasilar atelectasis. There is no evidence of focal airspace disease, pulmonary edema, suspicious pulmonary nodule/mass, pleural effusion, or pneumothorax. No acute bony abnormalities are identified. IMPRESSION: Low volume film with mild bibasilar atelectasis. Electronically Signed   By: Harmon Pier M.D.   On:  03/04/2021 13:11        Scheduled Meds:  benztropine  1 mg Oral BID   calcium-vitamin D  2 tablet Oral TID   clonazePAM  0.5 mg Oral BID   divalproex  500 mg Oral BID   enoxaparin (LOVENOX) injection  40 mg Subcutaneous Q24H   feeding supplement  237 mL Oral BID BM   fluticasone  2 spray Each Nare Daily   folic acid  1 mg Oral Daily   multivitamin with minerals  1 tablet Oral Daily   nicotine  21 mg Transdermal Daily   OLANZapine zydis  2.5 mg Oral BID   [START ON 03/07/2021] pantoprazole  40 mg Oral Daily   phosphorus  250 mg Oral BID   thiamine  100 mg Oral Daily   umeclidinium-vilanterol  1 puff Inhalation Daily   ziprasidone  20 mg Oral BID WC  Continuous Infusions:  dextrose 5 % and 0.9% NaCl 125 mL/hr at 03/06/21 0620     LOS: 2 days    Time spent: 40 minutes    Ramiro Harvest, MD [facility]   To contact the attending provider between 7A-7P or the covering provider during after hours 7P-7A, please log into the web site [email or web address] and access using universal [place] password for that web site. If you do not have the password, please call the hospital operator.  03/06/2021, 11:31 AM

## 2021-03-06 NOTE — Progress Notes (Signed)
Initial Nutrition Assessment  DOCUMENTATION CODES:   Obesity unspecified  INTERVENTION:   -Ensure Enlive po BID, each supplement provides 350 kcal and 20 grams of protein  -Magic cup BID with meals, each supplement provides 290 kcal and 9 grams of protein  NUTRITION DIAGNOSIS:   Inadequate oral intake related to  (AMS) as evidenced by per patient/family report.  GOAL:   Patient will meet greater than or equal to 90% of their needs  MONITOR:   PO intake, Supplement acceptance, Labs, Weight trends, I & O's  REASON FOR ASSESSMENT:   Malnutrition Screening Tool    ASSESSMENT:   60 year old female history of bipolar 1 disorder, PTSD, schizoaffective disorder, COPD recently hospitalized (7/4-7/5) for hypokalemia and acute kidney injury during the hospitalization noted to have hallucinations who presents back to the ED after a fall. Patient admitted for electrolyte abnormalities, acute kidney injury, auditory and visual hallucinations.  Patient currently alert/oriented x 1, hallucinating.  Pt is consuming 20-50% of meals at this time. Per family member, pt has had poor appetite over the past month.  Pt has also been reporting that someone is trying to poison her. Per psych note, recommending inpatient psych admission. Ensure supplements have been ordered. Will add Magic cups with meals.  Family reported 20 lbs of weight loss over 1 month. Per weight records, no weight loss noted.  Medications: OSCAL w/ D, Folic acid, Multivitamin with minerals daily, K-Phos, KLOR-CON, Thiamine, D5 infusion  Labs reviewed: CBGs: 92-110 Low Phos   NUTRITION - FOCUSED PHYSICAL EXAM:  Deferred.  Diet Order:   Diet Order             DIET DYS 3 Room service appropriate? Yes; Fluid consistency: Thin  Diet effective now                   EDUCATION NEEDS:   No education needs have been identified at this time  Skin:  Skin Assessment: Reviewed RN Assessment  Last BM:   PTA  Height:   Ht Readings from Last 1 Encounters:  03/04/21 5\' 2"  (1.575 m)    Weight:   Wt Readings from Last 1 Encounters:  03/04/21 78.1 kg    BMI:  Body mass index is 31.49 kg/m.  Estimated Nutritional Needs:   Kcal:  1500-1700  Protein:  60-75g  Fluid:  1.7L/day  05/05/21, MS, RD, LDN Inpatient Clinical Dietitian Contact information available via Amion

## 2021-03-06 NOTE — TOC Initial Note (Signed)
Transition of Care [facility]) - Initial/Assessment Note    Patient Details  Name: Michaela Wells MRN: [id number] Date of Birth: 03-02-1961  Transition of Care [facility]) CM/SW Contact:    Erin Sons, LCSW Phone Number: 03/06/2021, 1:58 PM  Clinical Narrative:                  CSW responding to [facility] consult for psych placement. Due to pt's current physical functioning, pt likely appropriate for Geropsych. CSW faxed referrals to [facility], and [facility] for [place] referrals. May refer pt to [facility] tomorrow when pt is medically stable to see if they would consider pt with her current need with supervision and assistance.   Expected Discharge Plan: Psychiatric Hospital Barriers to Discharge: Continued Medical Work up   Patient Goals and CMS Choice        Expected Discharge Plan and Services Expected Discharge Plan: Psychiatric Hospital         Expected Discharge Date:  (unknown)                                    Prior Living Arrangements/Services                       Activities of Daily Living Home Assistive Devices/Equipment: None ADL Screening (condition at time of admission) Patient's cognitive ability adequate to safely complete daily activities?: No Is the patient deaf or have difficulty hearing?: No Does the patient have difficulty seeing, even when wearing glasses/contacts?: No Does the patient have difficulty concentrating, remembering, or making decisions?: Yes Patient able to express need for assistance with ADLs?: Yes Does the patient have difficulty dressing or bathing?: Yes Independently performs ADLs?: No Communication: Independent Dressing (OT): Needs assistance Is this a change from baseline?: Pre-admission baseline Grooming: Independent Feeding: Needs assistance Is this a change from baseline?: Pre-admission baseline Bathing: Needs assistance Is this a change from baseline?: Pre-admission baseline Toileting: Needs  assistance Is this a change from baseline?: Pre-admission baseline In/Out Bed: Needs assistance Is this a change from baseline?: Pre-admission baseline Walks in Home: Needs assistance Is this a change from baseline?: Pre-admission baseline Does the patient have difficulty walking or climbing stairs?: Yes (secondary to weakness) Weakness of Legs: Both Weakness of Arms/Hands: None  Permission Sought/Granted                  Emotional Assessment         Alcohol / Substance Use: Alcohol Use Psych Involvement: Yes (comment)  Admission diagnosis:  Hypokalemia [E87.6] Altered mental status, unspecified altered mental status type [R41.82] Patient Active Problem List   Diagnosis Date Noted   Dehydration 03/04/2021   Transaminitis 03/04/2021   Auditory hallucination 03/04/2021   Visual hallucinations 03/04/2021   Acute metabolic encephalopathy 03/04/2021   AKI (acute kidney injury) (HCC) 02/26/2021   Hypokalemia 02/26/2021   Schizoaffective disorder, bipolar type (HCC) 06/14/2020   Psychosis (HCC) 10/23/2019   MDD (major depressive disorder) 10/19/2019   PTSD (post-traumatic stress disorder) 05/06/2018   Bipolar I disorder, current or most recent episode depressed, with psychotic features (HCC) 05/05/2018   COPD (chronic obstructive pulmonary disease) (HCC) 10/09/2013   GAD (generalized anxiety disorder) 10/09/2013   Smoking trying to quit 10/09/2013   GERD (gastroesophageal reflux disease) 10/09/2013   PCP:  [name], NP Pharmacy:   [facility] [phone] - [place], [place] - [address] [place] [address] [place] [address] Phone: [phone] Fax: [phone]     Social Determinants of Health (SDOH) Interventions    Readmission Risk Interventions No flowsheet data found.

## 2021-03-07 ENCOUNTER — Inpatient Hospital Stay (HOSPITAL_COMMUNITY): Payer: Medicaid Other

## 2021-03-07 ENCOUNTER — Inpatient Hospital Stay (HOSPITAL_COMMUNITY)
Admit: 2021-03-07 | Discharge: 2021-03-07 | Disposition: A | Discharge status: Home / Self Care | Payer: Medicaid Other | Attending: Internal Medicine | Admitting: Internal Medicine

## 2021-03-07 LAB — GLUCOSE, CAPILLARY
Glucose-Capillary: 104 mg/dL — ABNORMAL HIGH (ref 70–99)
Glucose-Capillary: 107 mg/dL — ABNORMAL HIGH (ref 70–99)
Glucose-Capillary: 111 mg/dL — ABNORMAL HIGH (ref 70–99)
Glucose-Capillary: 114 mg/dL — ABNORMAL HIGH (ref 70–99)
Glucose-Capillary: 119 mg/dL — ABNORMAL HIGH (ref 70–99)
Glucose-Capillary: 121 mg/dL — ABNORMAL HIGH (ref 70–99)

## 2021-03-07 LAB — COMPREHENSIVE METABOLIC PANEL
ALT: 38 U/L (ref 0–44)
AST: 52 U/L — ABNORMAL HIGH (ref 15–41)
Albumin: 3.4 g/dL — ABNORMAL LOW (ref 3.5–5.0)
Alkaline Phosphatase: 64 U/L (ref 38–126)
Anion gap: 12 (ref 5–15)
BUN: <5 mg/dL — ABNORMAL LOW (ref 6–20)
CO2: 24 mmol/L (ref 22–32)
Calcium: 8.5 mg/dL — ABNORMAL LOW (ref 8.9–10.3)
Chloride: 103 mmol/L (ref 98–111)
Creatinine, Ser: 1.09 mg/dL — ABNORMAL HIGH (ref 0.44–1.00)
GFR, Estimated: 58 mL/min — ABNORMAL LOW (ref >60)
Glucose, Bld: 106 mg/dL — ABNORMAL HIGH (ref 70–99)
Potassium: 4.3 mmol/L (ref 3.5–5.1)
Sodium: 139 mmol/L (ref 135–145)
Total Bilirubin: 0.9 mg/dL (ref 0.3–1.2)
Total Protein: 6.7 g/dL (ref 6.5–8.1)

## 2021-03-07 LAB — CBC
HCT: 44.2 % (ref 36.0–46.0)
Hemoglobin: 14.5 g/dL (ref 12.0–15.0)
MCH: 30 pg (ref 26.0–34.0)
MCHC: 32.8 g/dL (ref 30.0–36.0)
MCV: 91.5 fL (ref 80.0–100.0)
Platelets: 304 10*3/uL (ref 150–400)
RBC: 4.83 MIL/uL (ref 3.87–5.11)
RDW: 14 % (ref 11.5–15.5)
WBC: 6 10*3/uL (ref 4.0–10.5)
nRBC: 0 % (ref 0.0–0.2)

## 2021-03-07 LAB — PHOSPHORUS: Phosphorus: 3.2 mg/dL (ref 2.5–4.6)

## 2021-03-07 LAB — MAGNESIUM: Magnesium: 2 mg/dL (ref 1.7–2.4)

## 2021-03-07 IMAGING — CT CT ABD-PELV W/ CM
2 of 5 series · 16 of 46 positions shown, 18 images · IV contrast (OMNIPAQUE 300)
Comparison: Abdominal ultrasound dated [DATE].

CLINICAL DATA: 59-year-old female with abdominal pain. Concern for
hernia.

EXAM:
CT ABDOMEN AND PELVIS WITH CONTRAST
TECHNIQUE: Multidetector CT imaging of the abdomen and pelvis was performed
using the standard protocol following bolus administration of
intravenous contrast.
CONTRAST:  80mL OMNIPAQUE IOHEXOL 300 MG/ML  SOLN

[Series 2: axial st · axial · 0.83mm/px · z∈[-390,+10]mm · 13 of 94 slices shown, 15 images]
[im 7/94  soft-tissue]
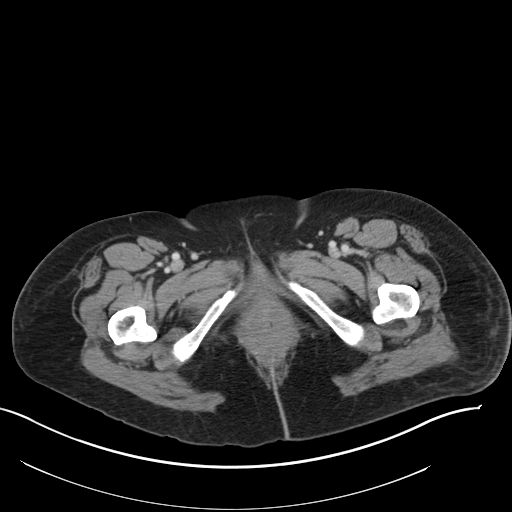
[im 7/94  bone]
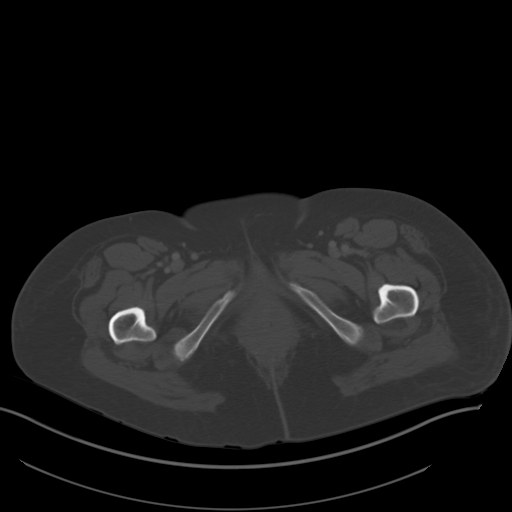
[im 13/94  soft-tissue]
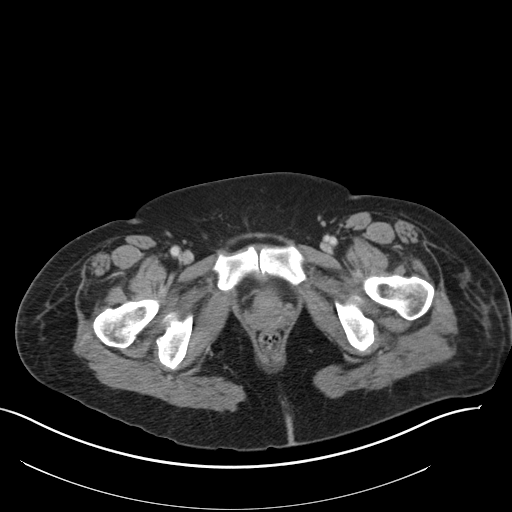
[im 19/94  soft-tissue]
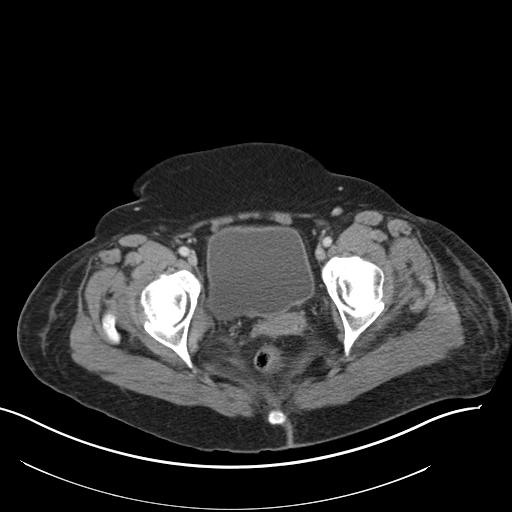
[im 25/94  soft-tissue]
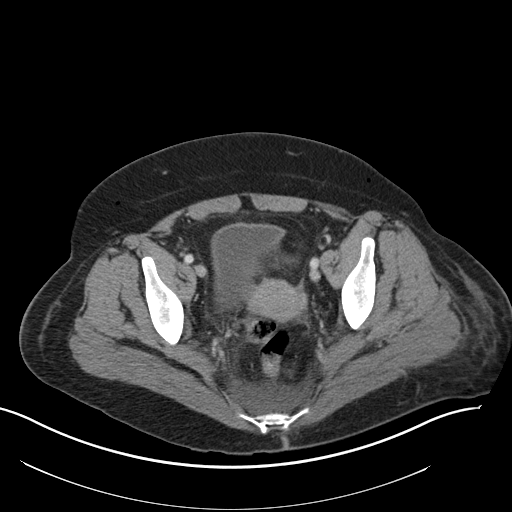
[im 32/94  soft-tissue]
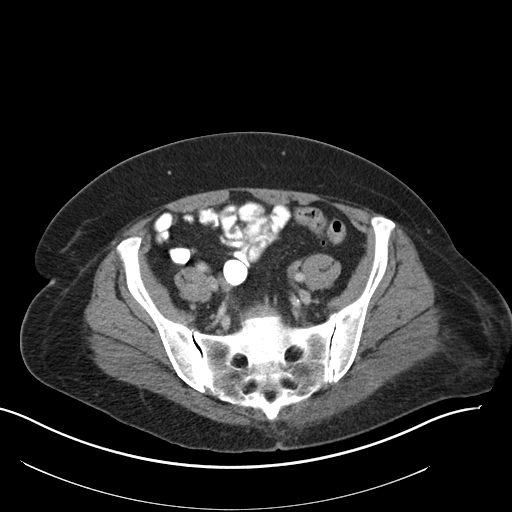
[im 38/94  soft-tissue]
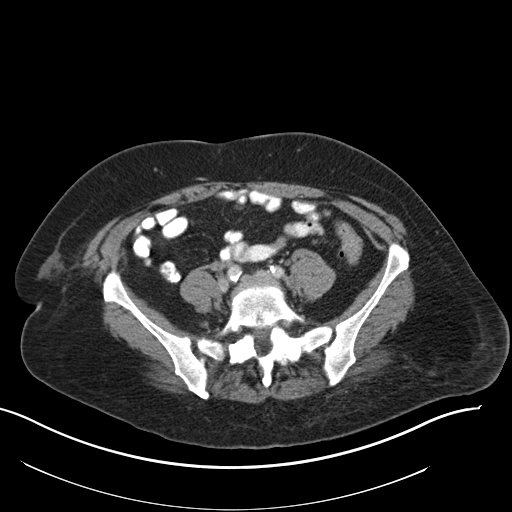
[im 50/94  soft-tissue]
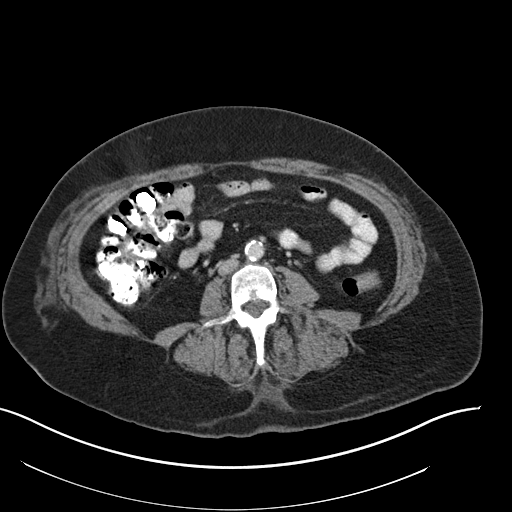
[im 56/94  soft-tissue]
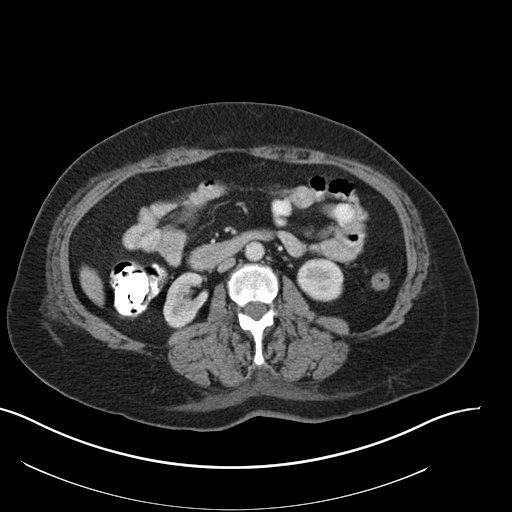
[im 63/94  soft-tissue]
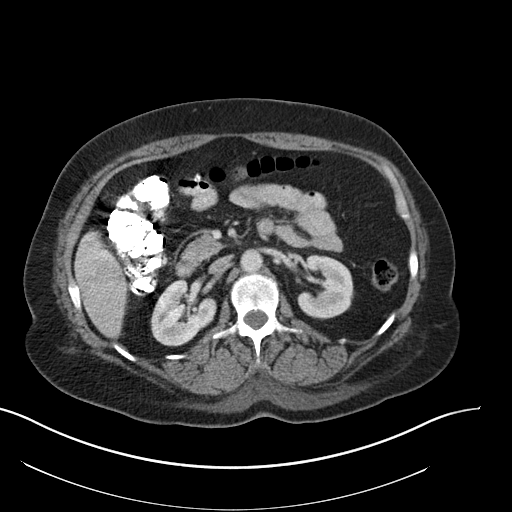
[im 63/94  bone]
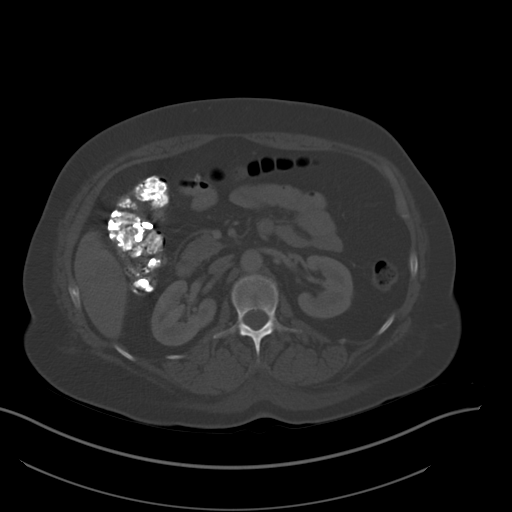
[im 69/94  soft-tissue]
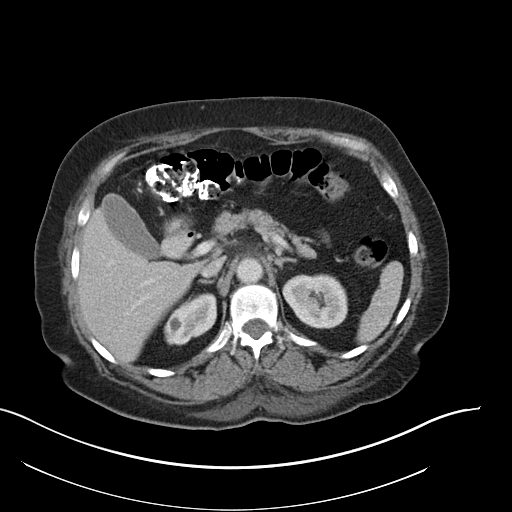
[im 75/94  soft-tissue]
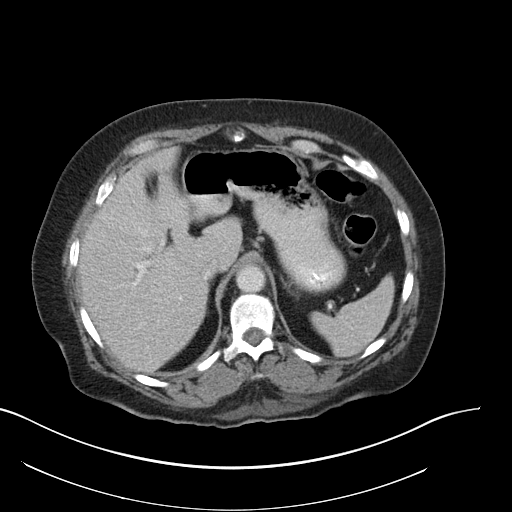
[im 81/94  soft-tissue]
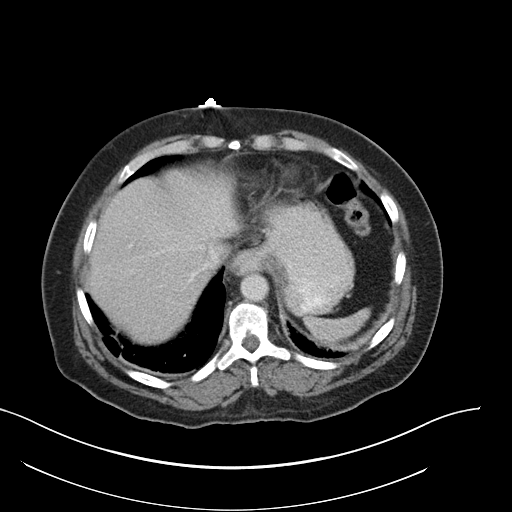
[im 87/94  soft-tissue]
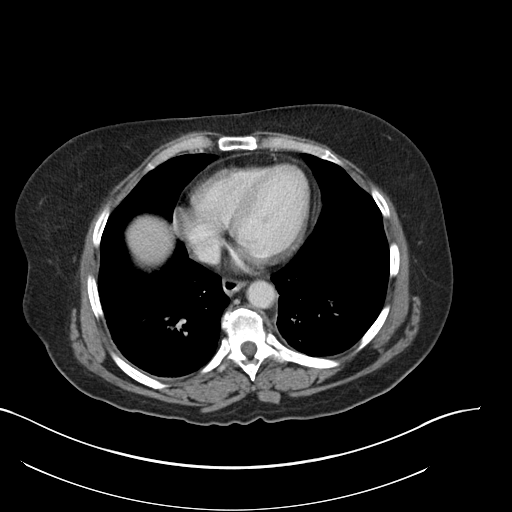

[Series 5: coronal st · coronal · 0.75mm/px · 3 of 151 slices shown]
[im 51/151  soft-tissue]
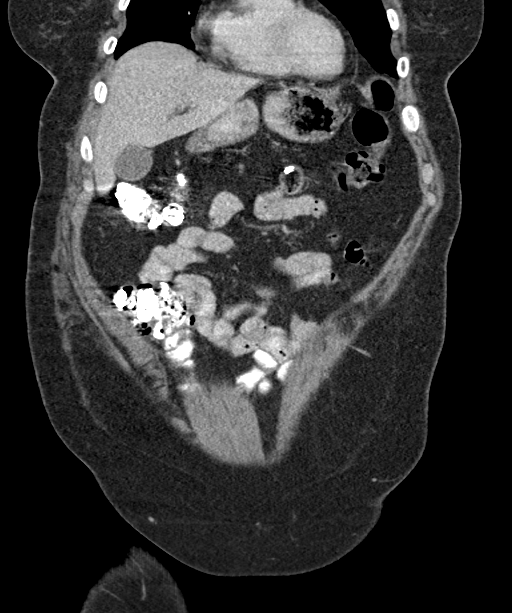
[im 67/151  soft-tissue]
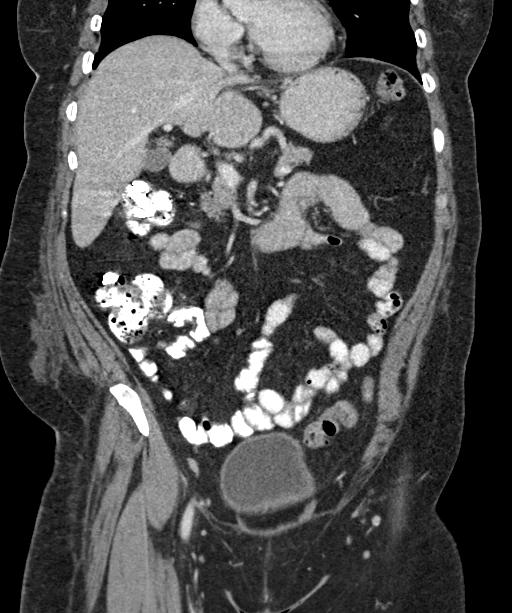
[im 84/151  soft-tissue]
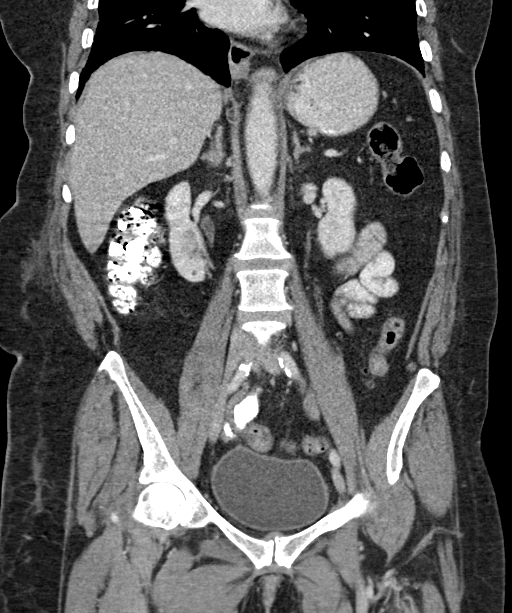

[16 of 46 positions shown; findings below may reference images not displayed]

FINDINGS: Lower chest: The visualized lung bases are clear.

No intra-abdominal free air or free fluid.

Hepatobiliary: No focal liver abnormality is seen. No gallstones,
gallbladder wall thickening, or biliary dilatation.

Pancreas: Unremarkable. No pancreatic ductal dilatation or
surrounding inflammatory changes.

Spleen: Normal in size without focal abnormality.

Adrenals/Urinary Tract: The adrenal glands unremarkable.
Subcentimeter right renal upper pole hypodense focus is too small to
characterize. There is right renal upper pole cortical scarring.
There is no hydronephrosis on either side. There is symmetric
enhancement and excretion of contrast by both kidneys. The
visualized ureters and the urinary bladder appear unremarkable.

Stomach/Bowel: There is a small hiatal hernia. There is sigmoid
diverticulosis without active inflammatory changes. There is no
bowel obstruction or active inflammation. The appendix is normal.

Vascular/Lymphatic: Mild aortoiliac atherosclerotic disease. The IVC
is unremarkable. No portal venous gas. There is no adenopathy.

Reproductive: The uterus is anteverted and grossly unremarkable. No
adnexal masses.

Other: None

Musculoskeletal: Mild subcutaneous edema of the left buttock. No
fluid collection. Faint 15 mm heterogeneous focus involving the
lateral aspect of the left gluteus maximus over the greater
trochanter of the femur (coronal 120/5 and axial [DATE] be
artifactual or represent a focal muscular contusion or traumatic
injury. Correlation with clinical exam and point tenderness
recommended. No fluid collection or large hematoma. No acute osseous
pathology.
IMPRESSION: 1. No acute intra-abdominal or pelvic pathology. No bowel
obstruction. Normal appendix.
2. Sigmoid diverticulosis.
3. Artifact versus a small focal contusion or muscular injury in the
lateral left gluteus maximus. Correlation with clinical exam and
point tenderness recommended. No fluid collection or hematoma.
4. Aortic Atherosclerosis ([UZ]-[UZ]).

## 2021-03-07 IMAGING — MR MR HEAD WO/W CM
12 of 14 series · 34 of 48 positions shown · IV contrast (gadavist)
Comparison: Prior CT from [DATE].

CLINICAL DATA: Initial evaluation for altered mental status,
unknown cause.

EXAM:
MRI HEAD WITHOUT AND WITH CONTRAST
TECHNIQUE: Multiplanar, multiecho pulse sequences of the brain and surrounding
structures were obtained without and with intravenous contrast.
CONTRAST:  8mL GADAVIST GADOBUTROL 1 MMOL/ML IV SOLN

[Series 7: FLAIR · axial · 3.0mm · 0.86mm/px · z∈[-110,+38]mm · 4 of 51 slices shown]
[im 1/51]
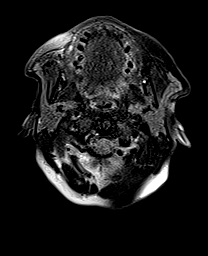
[im 17/51]
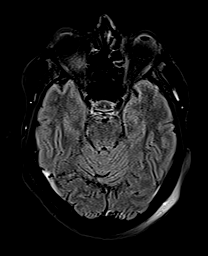
[im 34/51]
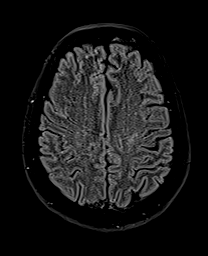
[im 51/51]
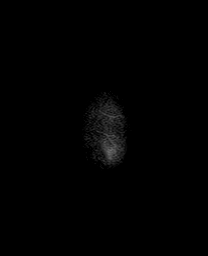

[Series 8: T2 · sagittal · 5.0mm · 0.47mm/px · 2 of 24 slices shown (1 of 2)]
[im 1/24]
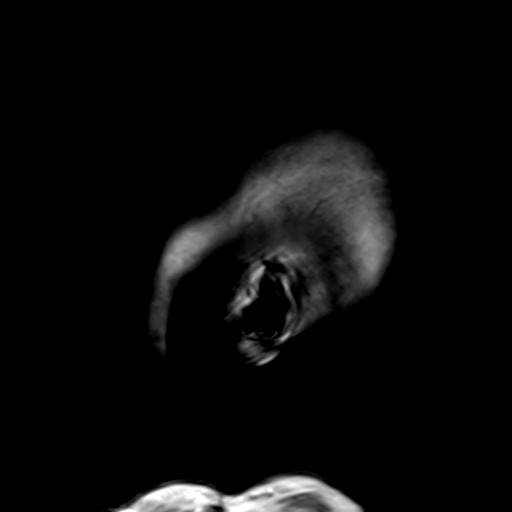
[im 24/24]
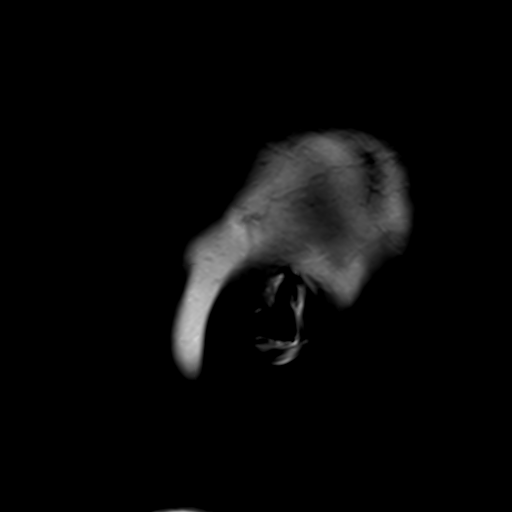

[Series 9: T2 · axial · 5.0mm · 0.45mm/px · z∈[-115,+39]mm · 2 of 25 slices shown (2 of 2)]
[im 1/25]
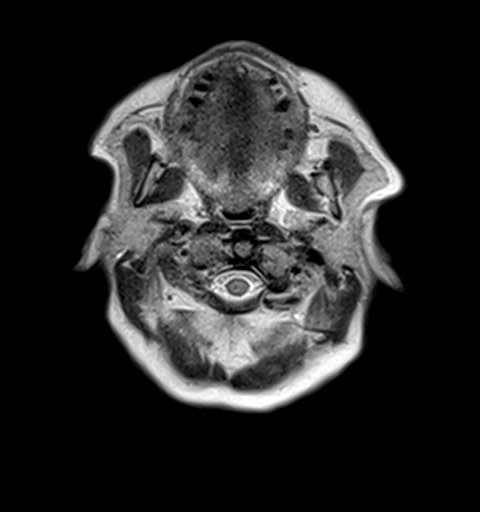
[im 25/25]
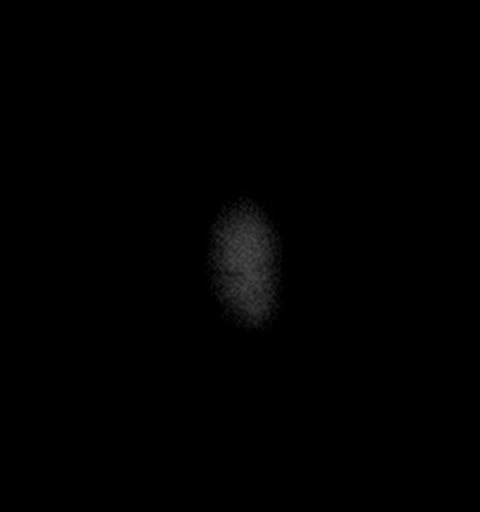

[Series 10: GRE · axial · 3.0mm · 0.45mm/px · z∈[-111,+37]mm · 4 of 51 slices shown]
[im 1/51]
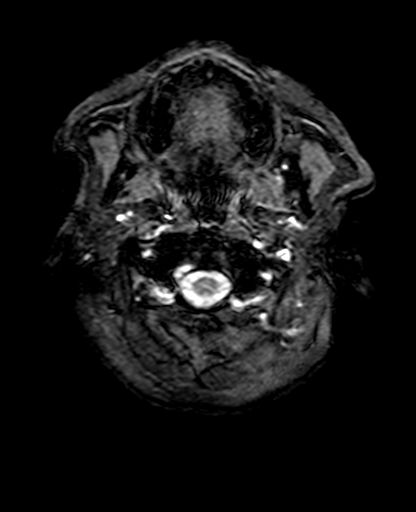
[im 17/51]
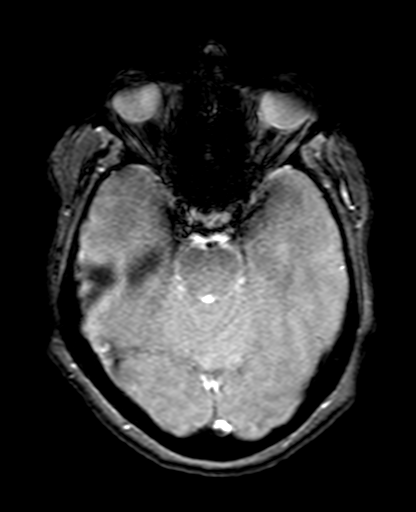
[im 34/51]
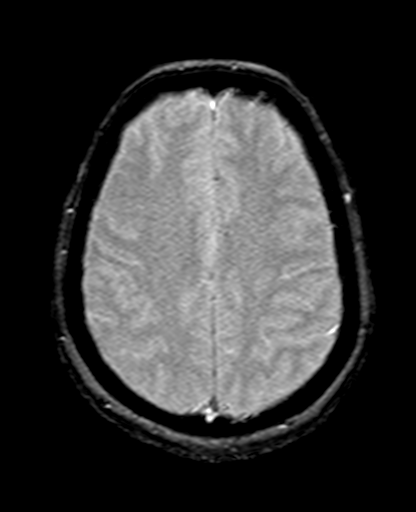
[im 51/51]
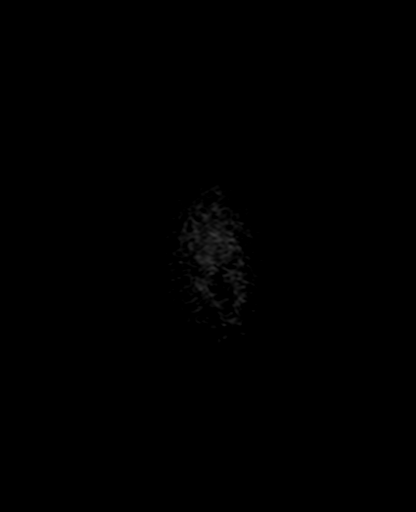

[Series 11: T1 · axial · 3.0mm · 0.45mm/px · z∈[-111,+37]mm · 4 of 51 slices shown]
[im 1/51]
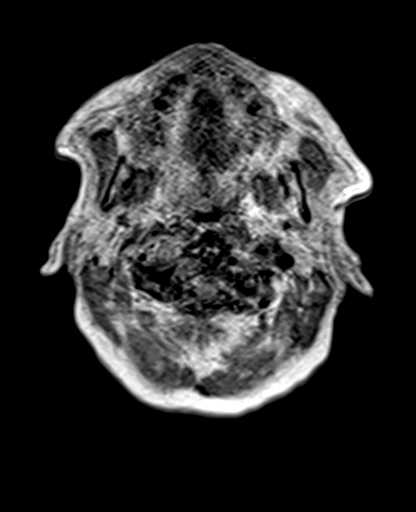
[im 17/51]
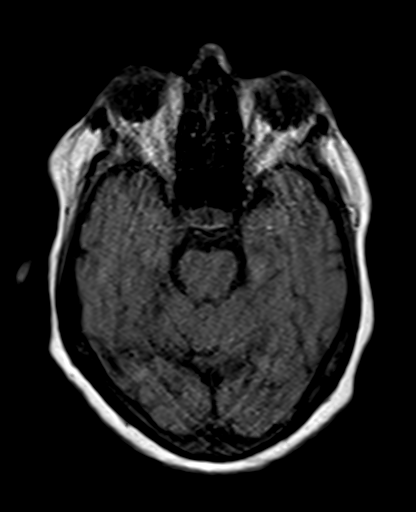
[im 34/51]
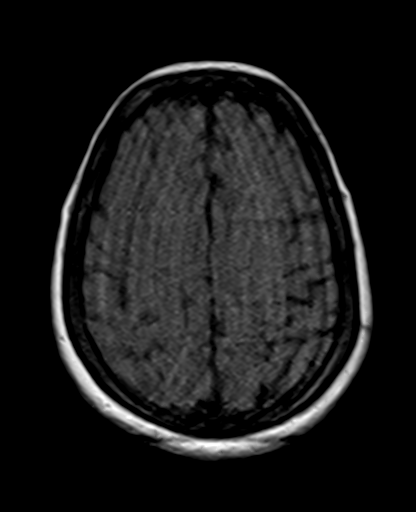
[im 51/51]
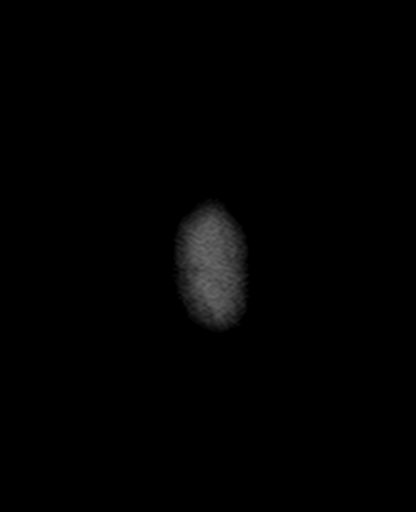

[Series 12: DWI · coronal · 5.0mm · 1.31mm/px · 4 of 56 slices shown (1 of 2)]
[im 1/56]
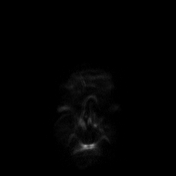
[im 19/56]
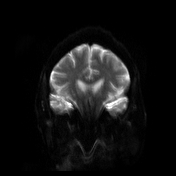
[im 37/56]
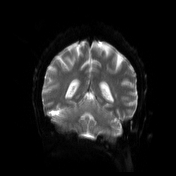
[im 56/56]
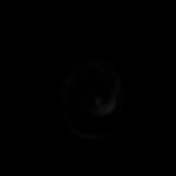

[Series 13: DWI · coronal · 5.0mm · 1.31mm/px · 2 of 28 slices shown (2 of 2)]
[im 1/28]
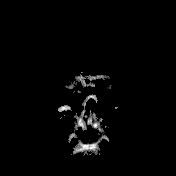
[im 28/28]
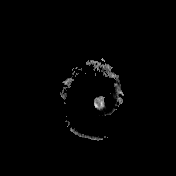

[Series 14: T2 post-contrast · coronal · 5.0mm · 0.86mm/px · 2 of 28 slices shown (1 of 2)]
[im 1/28]
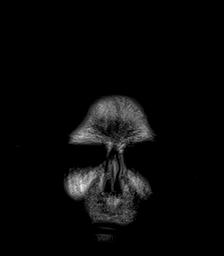
[im 28/28]
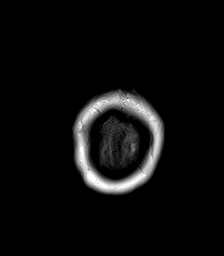

[Series 15: T1 post-contrast · axial · 3.0mm · 0.45mm/px · z∈[-111,+37]mm · 4 of 51 slices shown (1 of 3)]
[im 1/51]
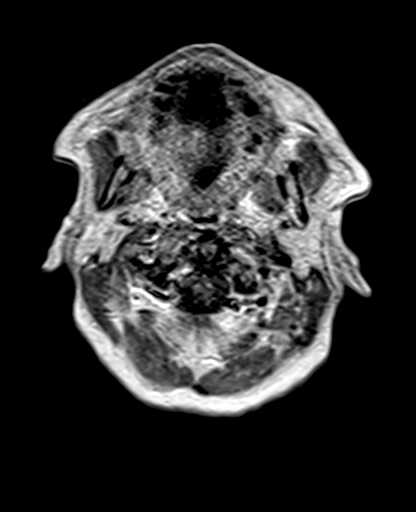
[im 17/51]
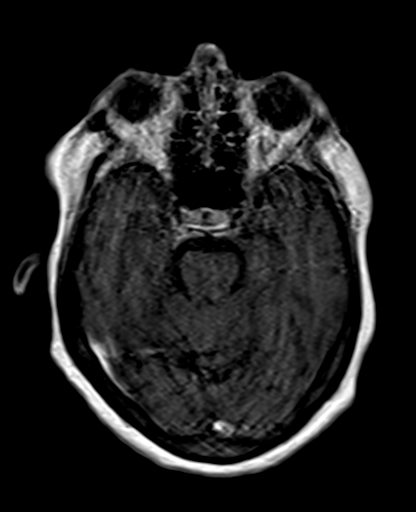
[im 34/51]
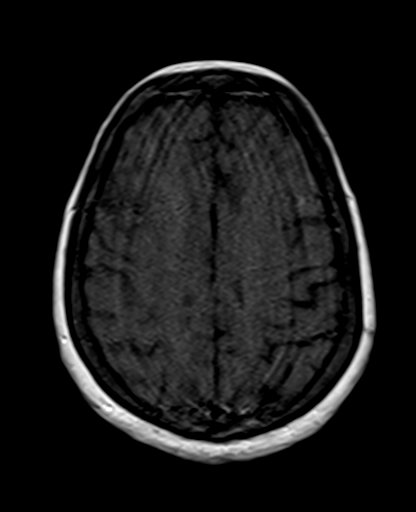
[im 51/51]
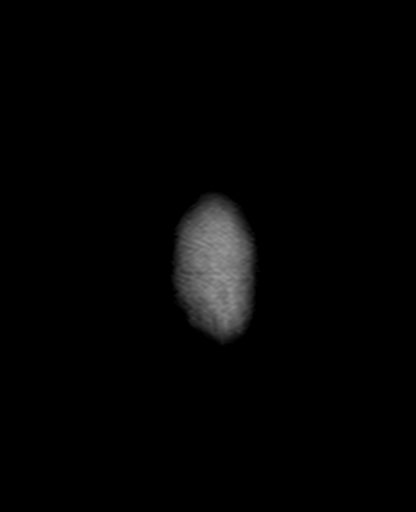

[Series 16: T1 post-contrast · coronal · 5.0mm · 0.43mm/px · 2 of 28 slices shown (2 of 3)]
[im 1/28]
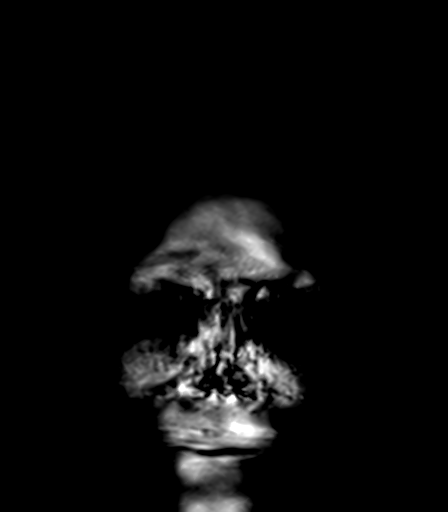
[im 28/28]
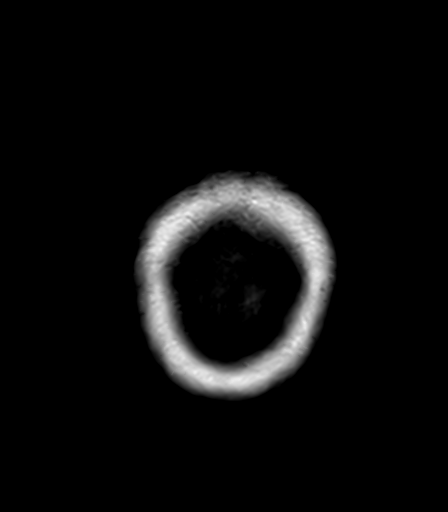

[Series 17: T1 post-contrast · sagittal · 5.0mm · 0.94mm/px · 2 of 24 slices shown (3 of 3)]
[im 1/24]
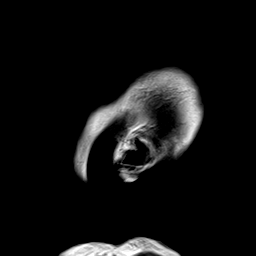
[im 24/24]
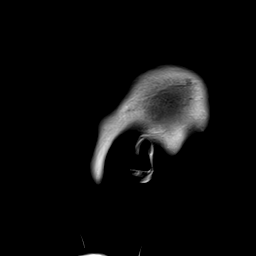

[Series 18: T2 post-contrast · coronal · 5.0mm · 0.86mm/px · 2 of 28 slices shown (2 of 2)]
[im 1/28]
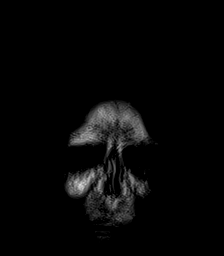
[im 28/28]
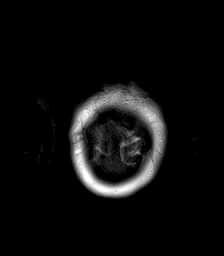

[34 of 48 positions shown; findings below may reference images not displayed]

FINDINGS: Brain: Examination moderately degraded by motion artifact.

Cerebral volume within normal limits. Scattered patchy T2/FLAIR
hyperintensity seen within the periventricular and deep white matter
both cerebral hemispheres, most consistent with chronic small vessel
ischemic disease, mild in nature. No other focal parenchymal signal
abnormality.

No abnormal foci of restricted diffusion to suggest acute or
subacute ischemia. Gray-white matter differentiation maintained. No
encephalomalacia to suggest chronic cortical infarction. No foci of
susceptibility artifact to suggest acute or chronic intracranial
hemorrhage.

No mass lesion, midline shift or mass effect. No hydrocephalus or
extra-axial fluid collection. Pituitary gland and suprasellar region
within normal limits. Midline structures intact. No definite
pathologic enhancement.

Vascular: Major intracranial vascular flow voids are grossly
maintained at the skull base.

Skull and upper cervical spine: Craniocervical junction within
normal limits. Bone marrow signal intensity grossly normal. No scalp
soft tissue abnormality.

Sinuses/Orbits: Globes and orbital soft tissues demonstrate no acute
finding. Paranasal sinuses are largely clear. Left mastoid effusion
present. Visualized nasopharynx within normal limits.

Other: None.
IMPRESSION: 1. Motion degraded exam.
2. No acute intracranial abnormality.
3. Mild chronic microvascular ischemic disease for age.
4. Left mastoid effusion, of uncertain significance. Correlation
with physical exam suggested.

## 2021-03-07 MED ORDER — IOHEXOL 9 MG/ML PO SOLN
500.0000 mL | ORAL | Status: AC
  Administered 2021-03-07 (×2): 500 mL via ORAL

## 2021-03-07 MED ORDER — METOPROLOL TARTRATE 25 MG PO TABS
12.5000 mg | ORAL_TABLET | Freq: Two times a day (BID) | ORAL | Status: DC
  Administered 2021-03-07 – 2021-03-13 (×13): 12.5 mg via ORAL
  Filled 2021-03-07 (×13): qty 1

## 2021-03-07 MED ORDER — IOHEXOL 300 MG/ML  SOLN
80.0000 mL | Freq: Once | INTRAMUSCULAR | Status: AC | PRN
  Administered 2021-03-07: 80 mL via INTRAVENOUS

## 2021-03-07 MED ORDER — GADOBUTROL 1 MMOL/ML IV SOLN
8.0000 mL | Freq: Once | INTRAVENOUS | Status: AC | PRN
  Administered 2021-03-07: 8 mL via INTRAVENOUS

## 2021-03-07 MED ORDER — ACETAMINOPHEN 650 MG RE SUPP: 650.0000 mg | Freq: Four times a day (QID) | RECTAL | Status: DC | PRN

## 2021-03-07 MED ORDER — ACETAMINOPHEN 160 MG/5ML PO SOLN
650.0000 mg | Freq: Four times a day (QID) | ORAL | Status: DC | PRN
  Administered 2021-03-07: 650 mg via ORAL
  Filled 2021-03-07: qty 20.3

## 2021-03-07 NOTE — Progress Notes (Signed)
Patient has been denied by [facility] and has been recommended for geri psych inpatient placement. Patient meets inpatient criteria per Karl Pock, MD. Patient referred to the following facilities:  [facility]  [address], [place] [address] [phone]  [facility] [address]  [address], [place] [address] [phone]  [facility]  [address] [place], [place] [address] [phone]  [facility]  [address] [place] [address] [phone]  [facility]  [address], [place] [address] [phone]  [facility]  [address] [place], [place] [address] [phone]  [facility]  [address] [place] [address] [phone]  [facility]  [address] [place] [address] [phone]  [facility]  [address], [place] [address] [phone]  [facility]  [address] [place] [address] [phone]  [facility]  [address] [place] [address] [phone]  [facility]  [address] [place] [address] [phone]  [facility]  [address] [place] [address] [phone]  [facility]  [address] [place] [address] [phone]  [facility]  [address] [place], [place] [address] [phone]  [facility]  [address], [place] [address] [phone]  [facility]  [address],  [place] [address] [phone]    CSW will continue to monitor disposition.    Damita Dunnings, MSW, LCSW-A  11:25 AM 03/07/2021

## 2021-03-07 NOTE — Progress Notes (Signed)
EEG Completed; Results Pending  

## 2021-03-07 NOTE — Procedures (Signed)
Patient Name: Michaela Wells  MRN: [id number]  Epilepsy Attending: Charlsie Quest  Referring Physician/Provider: Dr. Blanchard Mane Date: 03/07/2021 Duration: 23.21 mins  Patient history: 60 year old female with altered mental status.  EEG to evaluate seizures.  Level of alertness: Awake  AEDs during EEG study: Klonopin  Technical aspects: This EEG study was done with scalp electrodes positioned according to the 10-20 International system of electrode placement. Electrical activity was acquired at a sampling rate of 500Hz  and reviewed with a high frequency filter of 70Hz  and a low frequency filter of 1Hz . EEG data were recorded continuously and digitally stored.   Description: The posterior dominant rhythm consists of 8 Hz activity of moderate voltage (25-35 uV) seen predominantly in posterior head regions, symmetric and reactive to eye opening and eye closing. Hyperventilation and photic stimulation were not performed.     IMPRESSION: This study is within normal limits. No seizures or epileptiform discharges were seen throughout the recording.  [name] 

## 2021-03-07 NOTE — TOC Progression Note (Addendum)
Transition of Care [facility]) - Progression Note   Patient Details  Name: [name] MRN: [id number] Date of Birth: [birth year]  [facility]) CM/SW Contact  Arieana Somoza [place], [place] Phone Number: 03/07/2021, 9:38 AM  Clinical Narrative:     CSW called [facility] at [phone]; CSW is updated with referral status: "Referral is declined on behalf of the patient" CSW attempted to get more information regarding why the referral is denied. CSW is informed that information is the only thing listed under referral and that CSW would have to resubmit referral. CSW will refax referral.   CSW Called [facility] and is directed to [facility] Management at [phone]; no answer, left voicemail. Re-faxed referral to additional fax number [phone]  CSW Called [facility]; CSW is informed that [facility] management manages their referrals. CSW is provided phone number [phone]. CSW has already left message with [facility] Bed Management. It appears they manage both [facility] and [facility]  Referral made to [facility] today; no beds available and stated that pt is more Geropsych appropriate.  Pt not medically stable for DC today. TOC will continue to follow for geropsych placement.   Expected Discharge Plan: Psychiatric Hospital Barriers to Discharge: Continued Medical Work up  Expected Discharge Plan and Services Expected Discharge Plan: Psychiatric Hospital         Expected Discharge Date:  (unknown)                                     Social Determinants of Health (SDOH) Interventions    Readmission Risk Interventions No flowsheet data found.
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
## 2021-03-07 NOTE — Progress Notes (Signed)
PROGRESS NOTE    Michaela Wells  PYP:[id number] DOB: Nov 03, 1960 DOA: 03/04/2021 PCP: [name], NP   Brief Narrative: 60 year old female history of bipolar 1 disorder, PTSD, schizoaffective disorder, COPD recently hospitalized (7/4-7/5) for hypokalemia and acute kidney injury during the hospitalization noted to have hallucinations who presents back to the ED after a fall.  It is noted that patient is having both auditory and visual hallucinations which have worsened recently per daughter with some associated decreased oral intake and a 20 pound weight loss over the past month.  Infectious work-up done negative.  Comprehensive metabolic profile noted patient to be hypokalemic in acute kidney injury.  SARS coronavirus 2 PCR done negative.  Patient admitted for electrolyte abnormalities, acute kidney injury, auditory and visual hallucinations.  Psychiatry consulted.  Assessment & Plan:   Principal Problem:   Hypokalemia Active Problems:   COPD (chronic obstructive pulmonary disease) (HCC)   GAD (generalized anxiety disorder)   GERD (gastroesophageal reflux disease)   Bipolar I disorder, current or most recent episode depressed, with psychotic features (HCC)   PTSD (post-traumatic stress disorder)   MDD (major depressive disorder)   AKI (acute kidney injury) (HCC)   Dehydration   Transaminitis   Auditory hallucination   Visual hallucinations   Acute metabolic encephalopathy   #1 acute metabolic encephalopathy in the setting of bipolar disorder-she is admitted with auditory and visual hallucination.  Work-up so far includes infectious work-up which was negative.  CT head showed no acute abnormalities. Her Depakote level was high so that was discontinued. BuSpar and hydroxyzine is on hold. Patient was started on Zyprexa. Klonopin continued. Geodon dose decreased for psych. Psych recommending inpatient psych admission. Check B12 folate TSH and RPR B12 532, TSH 3.5 MRI brain is  ordered.  Will discuss with neurology.  #2 AKI likely prerenal secondary to decreased p.o. intake in the setting of oral diuretics.  Patient lost 20 pounds in the past month due to decreased p.o. intake.  Renal functions improved with hydration.  #3 severe hypokalemia which was thought to be due to HCTZ which has been stopped.  HCTZ should not be resumed on discharge.  #4 transaminitis likely secondary to dehydration.  Abdominal ultrasound shows sludge in the gallbladder without any acute cholecystitis.  LFTs trending down.  Hepatitis panel negative.  #5 odynophagia on dysphagia 3 diet.  Barium swallow pending.  #6 abdominal pain patient complaining of diffuse abdominal pain with tenderness.  We will check CT of the abdomen and pelvis.  Ultrasound of the abdomen noted with sludge in the gallbladder.  #7 hypertension patient was on HCTZ at home which has been stopped due to hypokalemia.  We will start the patient on Lopressor 12.5 twice daily.  Nutrition Problem: Inadequate oral intake Etiology:  (AMS)   Signs/Symptoms: per patient/family report  Interventions: Ensure Enlive (each supplement provides 350kcal and 20 grams of protein), MVI, Magic cup  Estimated body mass index is 32.22 kg/m as calculated from the following:   Height as of this encounter: 5\' 2"  (1.575 m).   Weight as of this encounter: 79.9 kg.  DVT prophylaxis: Lovenox  code Status: Full code  family Communication: None at bedside  disposition Plan:  Status is: Inpatient  Remains inpatient appropriate because:Inpatient level of care appropriate due to severity of illness  Dispo: The patient is from: Home              Anticipated d/c is to: Home  Patient currently is not medically stable to d/c.   Difficult to place patient Yes    Consultants: Psychiatry  Procedures: None Antimicrobials: None  Subjective: She is complaining of abdominal pain Has decreased p.o. intake  Objective: Vitals:    03/07/21 0437 03/07/21 0715 03/07/21 0800 03/07/21 0814  BP: (!) 167/110     Pulse: 85     Resp: 20  17   Temp: 99.6 F (37.6 C)     TempSrc: Oral     SpO2: 98%   95%  Weight:  79.9 kg    Height:        Intake/Output Summary (Last 24 hours) at 03/07/2021 1159 Last data filed at 03/07/2021 0741 Gross per 24 hour  Intake 1512.29 ml  Output 3300 ml  Net -1787.71 ml   Filed Weights   03/04/21 1232 03/04/21 1814 03/07/21 0715  Weight: 76.3 kg 78.1 kg 79.9 kg    Examination: Patient is resting in bed complaining of abdominal pain Respiratory system: Clear to auscultation. Respiratory effort normal. Cardiovascular system: S1 & S2 heard, RRR. No JVD, murmurs, rubs, gallops or clicks. No pedal edema. Gastrointestinal system: Abdomen is nondistended, soft and tender. No organomegaly or masses felt. Normal bowel sounds heard. Central nervous system: Alert and oriented. No focal neurological deficits. Extremities: Symmetric 5 x 5 power. Skin: No rashes, lesions or ulcers Psychiatry: unable  to assess  Data Reviewed: I have personally reviewed following labs and imaging studies  CBC: Recent Labs  Lab 03/04/21 1410 03/05/21 0452 03/06/21 0501 03/07/21 0508  WBC 5.3 3.5* 4.0 6.0  NEUTROABS 3.0  --   --   --   HGB 12.3 12.0 12.7 14.5  HCT 36.6 37.6 39.5 44.2  MCV 89.7 92.8 92.9 91.5  PLT 202 200 254 304   Basic Metabolic Panel: Recent Labs  Lab 03/04/21 1410 03/04/21 1843 03/05/21 0452 03/06/21 0501 03/07/21 0508  NA 139  --  139 143 139  K 2.4*  --  3.4* 3.5 4.3  CL 102  --  104 110 103  CO2 26  --  25 26 24   GLUCOSE 83  --  58* 93 106*  BUN 20  --  15 7 <5*  CREATININE 1.47*  --  1.12* 1.00 1.09*  CALCIUM 7.3*  --  7.5* 7.8* 8.5*  MG  --  2.3 2.2 2.2 2.0  PHOS  --   --  2.8 2.3* 3.2   GFR: Estimated Creatinine Clearance: 53.7 mL/min (A) (by C-G formula based on SCr of 1.09 mg/dL (H)). Liver Function Tests: Recent Labs  Lab 03/04/21 1410 03/05/21 0452  03/06/21 0501 03/07/21 0508  AST 112* 92* 59* 52*  ALT 46* 42 36 38  ALKPHOS 50 58 59 64  BILITOT 0.6 0.5 0.3 0.9  PROT 5.7* 6.1* 6.0* 6.7  ALBUMIN 2.8* 3.2* 3.0* 3.4*   No results for input(s): LIPASE, AMYLASE in the last 168 hours. Recent Labs  Lab 03/04/21 1843  AMMONIA 26   Coagulation Profile: No results for input(s): INR, PROTIME in the last 168 hours. Cardiac Enzymes: No results for input(s): CKTOTAL, CKMB, CKMBINDEX, TROPONINI in the last 168 hours. BNP (last 3 results) No results for input(s): PROBNP in the last 8760 hours. HbA1C: No results for input(s): HGBA1C in the last 72 hours. CBG: Recent Labs  Lab 03/06/21 1810 03/06/21 2142 03/07/21 0045 03/07/21 0432 03/07/21 0748  GLUCAP 98 116* 119* 121* 107*   Lipid Profile: No results for input(s): CHOL, HDL, LDLCALC, TRIG,  CHOLHDL, LDLDIRECT in the last 72 hours. Thyroid Function Tests: No results for input(s): TSH, T4TOTAL, FREET4, T3FREE, THYROIDAB in the last 72 hours. Anemia Panel: No results for input(s): VITAMINB12, FOLATE, FERRITIN, TIBC, IRON, RETICCTPCT in the last 72 hours. Sepsis Labs: No results for input(s): PROCALCITON, LATICACIDVEN in the last 168 hours.  Recent Results (from the past 240 hour(s))  SARS CORONAVIRUS 2 (TAT 6-24 HRS) Nasopharyngeal Nasopharyngeal Swab     Status: None   Collection Time: 02/26/21  2:55 PM   Specimen: Nasopharyngeal Swab  Result Value Ref Range Status   SARS Coronavirus 2 NEGATIVE NEGATIVE Final    Comment: (NOTE) SARS-CoV-2 target nucleic acids are NOT DETECTED.  The SARS-CoV-2 RNA is generally detectable in upper and lower respiratory specimens during the acute phase of infection. Negative results do not preclude SARS-CoV-2 infection, do not rule out co-infections with other pathogens, and should not be used as the sole basis for treatment or other patient management decisions. Negative results must be combined with clinical observations, patient history,  and epidemiological information. The expected result is Negative.  Fact Sheet for Patients: HairSlick.no  Fact Sheet for Healthcare Providers: quierodirigir.com  This test is not yet approved or cleared by the [place] FDA and  has been authorized for detection and/or diagnosis of SARS-CoV-2 by FDA under an Emergency Use Authorization (EUA). This EUA will remain  in effect (meaning this test can be used) for the duration of the COVID-19 declaration under Se ction 564(b)(1) of the Act, 21 U.S.C. section 360bbb-3(b)(1), unless the authorization is terminated or revoked sooner.  Performed at [facility], [address], [place], [place] [address]   Resp Panel by RT-PCR (Flu A&B, Covid) Nasopharyngeal Swab     Status: None   Collection Time: 03/04/21  2:28 PM   Specimen: Nasopharyngeal Swab; Nasopharyngeal(NP) swabs in vial transport medium  Result Value Ref Range Status   SARS Coronavirus 2 by RT PCR NEGATIVE NEGATIVE Final    Comment: (NOTE) SARS-CoV-2 target nucleic acids are NOT DETECTED.  The SARS-CoV-2 RNA is generally detectable in upper respiratory specimens during the acute phase of infection. The lowest concentration of SARS-CoV-2 viral copies this assay can detect is 138 copies/mL. A negative result does not preclude SARS-Cov-2 infection and should not be used as the sole basis for treatment or other patient management decisions. A negative result may occur with  improper specimen collection/handling, submission of specimen other than nasopharyngeal swab, presence of viral mutation(s) within the areas targeted by this assay, and inadequate number of viral copies(<138 copies/mL). A negative result must be combined with clinical observations, patient history, and epidemiological information. The expected result is Negative.  Fact Sheet for Patients:  BloggerCourse.com  Fact Sheet  for Healthcare Providers:  SeriousBroker.it  This test is no t yet approved or cleared by the [place] FDA and  has been authorized for detection and/or diagnosis of SARS-CoV-2 by FDA under an Emergency Use Authorization (EUA). This EUA will remain  in effect (meaning this test can be used) for the duration of the COVID-19 declaration under Section 564(b)(1) of the Act, 21 U.S.C.section 360bbb-3(b)(1), unless the authorization is terminated  or revoked sooner.       Influenza A by PCR NEGATIVE NEGATIVE Final   Influenza B by PCR NEGATIVE NEGATIVE Final    Comment: (NOTE) The Xpert Xpress SARS-CoV-2/FLU/RSV plus assay is intended as an aid in the diagnosis of influenza from Nasopharyngeal swab specimens and should not be used as  a sole basis for treatment. Nasal washings and aspirates are unacceptable for Xpert Xpress SARS-CoV-2/FLU/RSV testing.  Fact Sheet for Patients: BloggerCourse.com  Fact Sheet for Healthcare Providers: SeriousBroker.it  This test is not yet approved or cleared by the [place] FDA and has been authorized for detection and/or diagnosis of SARS-CoV-2 by FDA under an Emergency Use Authorization (EUA). This EUA will remain in effect (meaning this test can be used) for the duration of the COVID-19 declaration under Section 564(b)(1) of the Act, 21 U.S.C. section 360bbb-3(b)(1), unless the authorization is terminated or revoked.  Performed at [facility], [address], [place], [place] [address]   Culture, Urine     Status: None   Collection Time: 03/05/21  4:14 AM   Specimen: Urine, Clean Catch  Result Value Ref Range Status   Specimen Description   Final    URINE, CLEAN CATCH Performed at [facility], [address], [place], [place] [address]    Special Requests   Final    NONE Performed at [facility],  [address] [place], [place] [address]    Culture   Final    NO GROWTH Performed at [facility], [address] [place], [place] [address]    Report Status 03/06/2021 FINAL  Final         Radiology Studies: DG ESOPHAGUS W SINGLE CM (SOL OR THIN BA)  Result Date: 03/06/2021 CLINICAL DATA:  Limited study due to patient immobility. EXAM: ESOPHOGRAM/BARIUM SWALLOW TECHNIQUE: Single contrast examination was performed using  thin barium. FLUOROSCOPY TIME:  Fluoroscopy Time:  2 minutes and 6 seconds. Radiation Exposure Index (if provided by the fluoroscopic device): 27 mGy Number of Acquired Spot Images: COMPARISON:  None. FINDINGS: Single contrast imaging shows no gross esophageal mass or stricture. No esophageal diverticulum. Small hiatal hernia. Mass-effect noted in the proximal stomach, potentially from food. Gastric emptying is prompt. IMPRESSION: Limited study due to patient immobility. No gross esophageal mass lesion or stricture. Small hiatal hernia. Filling defect in the stomach likely represents food material. Further evaluation with upper GI series after an appropriate period of fasting could be used to further evaluate as clinically warranted. This study should be deferred until the patient is able to better cooperate with positioning. If immediate assessment is required, CT abdomen/pelvis could be used to further evaluate. Electronically Signed   By: Kennith Center M.D.   On: 03/06/2021 14:58        Scheduled Meds:  benztropine  1 mg Oral BID   calcium-vitamin D  2 tablet Oral TID   clonazePAM  0.5 mg Oral BID   enoxaparin (LOVENOX) injection  40 mg Subcutaneous Q24H   feeding supplement  237 mL Oral BID BM   fluticasone  2 spray Each Nare Daily   folic acid  1 mg Oral Daily   multivitamin with minerals  1 tablet Oral Daily   nicotine  21 mg Transdermal Daily   OLANZapine zydis  2.5 mg Oral BID   pantoprazole  40 mg Oral Daily   phosphorus  250 mg Oral BID    thiamine  100 mg Oral Daily   umeclidinium-vilanterol  1 puff Inhalation Daily   ziprasidone  20 mg Oral BID WC   Continuous Infusions:   LOS: 3 days    Time spent: 40 minutes  Alwyn Ren, MD 03/07/2021, 11:59 AM

## 2021-03-08 ENCOUNTER — Other Ambulatory Visit: Payer: Self-pay

## 2021-03-08 LAB — GLUCOSE, CAPILLARY
Glucose-Capillary: 110 mg/dL — ABNORMAL HIGH (ref 70–99)
Glucose-Capillary: 105 mg/dL — ABNORMAL HIGH (ref 70–99)
Glucose-Capillary: 102 mg/dL — ABNORMAL HIGH (ref 70–99)
Glucose-Capillary: 104 mg/dL — ABNORMAL HIGH (ref 70–99)

## 2021-03-08 LAB — FOLATE: Folate: 59.1 ng/mL (ref >5.9)

## 2021-03-08 LAB — VALPROIC ACID LEVEL: Valproic Acid Lvl: 56 ug/mL (ref 50.0–100.0)

## 2021-03-08 MED ORDER — BISACODYL 5 MG PO TBEC
10.0000 mg | DELAYED_RELEASE_TABLET | Freq: Every day | ORAL | Status: DC
  Administered 2021-03-08 – 2021-03-13 (×6): 10 mg via ORAL
  Filled 2021-03-08 (×6): qty 2

## 2021-03-08 MED ORDER — SENNOSIDES-DOCUSATE SODIUM 8.6-50 MG PO TABS
2.0000 | ORAL_TABLET | Freq: Two times a day (BID) | ORAL | Status: AC
  Administered 2021-03-08 – 2021-03-10 (×6): 2 via ORAL
  Filled 2021-03-08 (×6): qty 2

## 2021-03-08 MED ORDER — POLYETHYLENE GLYCOL 3350 17 G PO PACK
17.0000 g | PACK | Freq: Every day | ORAL | Status: DC
  Administered 2021-03-08 – 2021-03-13 (×6): 17 g via ORAL
  Filled 2021-03-08 (×6): qty 1

## 2021-03-08 NOTE — Progress Notes (Signed)
PROGRESS NOTE    Michaela CAMERA  Wells:[id number] DOB: 1960-11-04 DOA: 03/04/2021 PCP: [name], NP   Brief Narrative: 60 year old female history of bipolar 1 disorder, PTSD, schizoaffective disorder, COPD recently hospitalized (7/4-7/5) for hypokalemia and acute kidney injury during the hospitalization noted to have hallucinations who presents back to the ED after a fall.  It is noted that patient is having both auditory and visual hallucinations which have worsened recently per daughter with some associated decreased oral intake and a 20 pound weight loss over the past month.  Infectious work-up done negative.  Comprehensive metabolic profile noted patient to be hypokalemic in acute kidney injury.  SARS coronavirus 2 PCR done negative.  Patient admitted for electrolyte abnormalities, acute kidney injury, auditory and visual hallucinations.  Psychiatry consulted.  Assessment & Plan:   Principal Problem:   Hypokalemia Active Problems:   COPD (chronic obstructive pulmonary disease) (HCC)   GAD (generalized anxiety disorder)   GERD (gastroesophageal reflux disease)   Bipolar I disorder, current or most recent episode depressed, with psychotic features (HCC)   PTSD (post-traumatic stress disorder)   MDD (major depressive disorder)   AKI (acute kidney injury) (HCC)   Dehydration   Transaminitis   Auditory hallucination   Visual hallucinations   Acute metabolic encephalopathy   #1 acute metabolic encephalopathy in the setting of bipolar disorder-she is admitted with auditory and visual hallucination.  Work-up so far includes infectious work-up which was negative.  CT head showed no acute abnormalities. Her Depakote level was high so that was discontinued.  Depakote level was 1 32 down to 56 today. BuSpar and hydroxyzine is on hold. Patient was started on Zyprexa. Klonopin continued. Geodon dose decreased for psych. Psych recommending inpatient psych admission. Check B12 folate TSH  and RPR B12 532, TSH 3.5 MRI brain -no acute findings.  Discussed with neurology on-call.  #2 AKI likely prerenal secondary to decreased p.o. intake in the setting of oral diuretics.  Patient lost 20 pounds in the past month due to decreased p.o. intake.  Renal functions improved with hydration.  #3 severe hypokalemia which was thought to be due to HCTZ which has been stopped.  HCTZ should not be resumed on discharge.  #4 transaminitis likely secondary to dehydration.  Abdominal ultrasound shows sludge in the gallbladder without any acute cholecystitis.  LFTs trending down.  Hepatitis panel negative.  #5 odynophagia on dysphagia 3 diet.  Barium swallow unremarkable.she has no c/o today.  #6 abdominal pain -resolved.ct negative.  #7 hypertension patient was on HCTZ at home which has been stopped due to hypokalemia.  We will start the patient on Lopressor 12.5 twice daily.  Nutrition Problem: Inadequate oral intake Etiology:  (AMS)   Signs/Symptoms: per patient/family report  Interventions: Ensure Enlive (each supplement provides 350kcal and 20 grams of protein), MVI, Magic cup  Estimated body mass index is 31.05 kg/m as calculated from the following:   Height as of this encounter:  (1.575 m).   Weight as of this encounter: 77 kg.  DVT prophylaxis: Lovenox  code Status: Full code  family Communication: None at bedside  disposition Plan:  Status is: Inpatient  Remains inpatient appropriate because:Inpatient level of care appropriate due to severity of illness  Dispo: The patient is from: Home              Anticipated d/c is to: Home              Patient currently is not medically stable to  d/c.   Difficult to place patient Yes    Consultants: Psychiatry  Procedures: None Antimicrobials: None  Subjective: No c/o today Objective: Vitals:   03/07/21 2038 03/08/21 0412 03/08/21 0500 03/08/21 0825  BP: (!) 156/104 (!) 146/91    Pulse: 83 71    Resp:  18    Temp:   99.1 F (37.3 C)    TempSrc:  Oral    SpO2:  94%  95%  Weight:   77 kg   Height:       No intake or output data in the 24 hours ending 03/08/21 1301  Filed Weights   03/04/21 1814 03/07/21 0715 03/08/21 0500  Weight: 78.1 kg 79.9 kg 77 kg    Examination: Patient is resting in bed  Respiratory system: Clear to auscultation. Respiratory effort normal. Cardiovascular system: S1 & S2 heard, RRR. No JVD, murmurs, rubs, gallops or clicks. No pedal edema. Gastrointestinal system: Abdomen is nondistended, soft and tender. No organomegaly or masses felt. Normal bowel sounds heard. Central nervous system: Alert and oriented. No focal neurological deficits. Extremities: Symmetric 5 x 5 power. Skin: No rashes, lesions or ulcers Psychiatry: unable  to assess  Data Reviewed: I have personally reviewed following labs and imaging studies  CBC: Recent Labs  Lab 03/04/21 1410 03/05/21 0452 03/06/21 0501 03/07/21 0508  WBC 5.3 3.5* 4.0 6.0  NEUTROABS 3.0  --   --   --   HGB 12.3 12.0 12.7 14.5  HCT 36.6 37.6 39.5 44.2  MCV 89.7 92.8 92.9 91.5  PLT 202 200 254 304    Basic Metabolic Panel: Recent Labs  Lab 03/04/21 1410 03/04/21 1843 03/05/21 0452 03/06/21 0501 03/07/21 0508  NA 139  --  139 143 139  K 2.4*  --  3.4* 3.5 4.3  CL 102  --  104 110 103  CO2 26  --  25 26 24   GLUCOSE 83  --  58* 93 106*  BUN 20  --  15 7 <5*  CREATININE 1.47*  --  1.12* 1.00 1.09*  CALCIUM 7.3*  --  7.5* 7.8* 8.5*  MG  --  2.3 2.2 2.2 2.0  PHOS  --   --  2.8 2.3* 3.2    GFR: Estimated Creatinine Clearance: 52.8 mL/min (A) (by C-G formula based on SCr of 1.09 mg/dL (H)). Liver Function Tests: Recent Labs  Lab 03/04/21 1410 03/05/21 0452 03/06/21 0501 03/07/21 0508  AST 112* 92* 59* 52*  ALT 46* 42 36 38  ALKPHOS 50 58 59 64  BILITOT 0.6 0.5 0.3 0.9  PROT 5.7* 6.1* 6.0* 6.7  ALBUMIN 2.8* 3.2* 3.0* 3.4*    No results for input(s): LIPASE, AMYLASE in the last 168 hours. Recent Labs   Lab 03/04/21 1843  AMMONIA 26    Coagulation Profile: No results for input(s): INR, PROTIME in the last 168 hours. Cardiac Enzymes: No results for input(s): CKTOTAL, CKMB, CKMBINDEX, TROPONINI in the last 168 hours. BNP (last 3 results) No results for input(s): PROBNP in the last 8760 hours. HbA1C: No results for input(s): HGBA1C in the last 72 hours. CBG: Recent Labs  Lab 03/07/21 1635 03/07/21 2003 03/07/21 2342 03/08/21 0410 03/08/21 0755  GLUCAP 104* 111* 114* 110* 105*    Lipid Profile: No results for input(s): CHOL, HDL, LDLCALC, TRIG, CHOLHDL, LDLDIRECT in the last 72 hours. Thyroid Function Tests: No results for input(s): TSH, T4TOTAL, FREET4, T3FREE, THYROIDAB in the last 72 hours. Anemia Panel: No results for input(s): VITAMINB12, FOLATE,  FERRITIN, TIBC, IRON, RETICCTPCT in the last 72 hours. Sepsis Labs: No results for input(s): PROCALCITON, LATICACIDVEN in the last 168 hours.  Recent Results (from the past 240 hour(s))  SARS CORONAVIRUS 2 (TAT 6-24 HRS) Nasopharyngeal Nasopharyngeal Swab     Status: None   Collection Time: 02/26/21  2:55 PM   Specimen: Nasopharyngeal Swab  Result Value Ref Range Status   SARS Coronavirus 2 NEGATIVE NEGATIVE Final    Comment: (NOTE) SARS-CoV-2 target nucleic acids are NOT DETECTED.  The SARS-CoV-2 RNA is generally detectable in upper and lower respiratory specimens during the acute phase of infection. Negative results do not preclude SARS-CoV-2 infection, do not rule out co-infections with other pathogens, and should not be used as the sole basis for treatment or other patient management decisions. Negative results must be combined with clinical observations, patient history, and epidemiological information. The expected result is Negative.  Fact Sheet for Patients: HairSlick.nohttps://www.fda.gov/media/138098/download  Fact Sheet for Healthcare Providers: quierodirigir.comhttps://www.fda.gov/media/138095/download  This test is not yet  approved or cleared by the [place] FDA and  has been authorized for detection and/or diagnosis of SARS-CoV-2 by FDA under an Emergency Use Authorization (EUA). This EUA will remain  in effect (meaning this test can be used) for the duration of the COVID-19 declaration under Se ction 564(b)(1) of the Act, 21 U.S.C. section 360bbb-3(b)(1), unless the authorization is terminated or revoked sooner.  Performed at [facility], [address] [place], [place] [address]   Resp Panel by RT-PCR (Flu A&B, Covid) Nasopharyngeal Swab     Status: None   Collection Time: 03/04/21  2:28 PM   Specimen: Nasopharyngeal Swab; Nasopharyngeal(NP) swabs in vial transport medium  Result Value Ref Range Status   SARS Coronavirus 2 by RT PCR NEGATIVE NEGATIVE Final    Comment: (NOTE) SARS-CoV-2 target nucleic acids are NOT DETECTED.  The SARS-CoV-2 RNA is generally detectable in upper respiratory specimens during the acute phase of infection. The lowest concentration of SARS-CoV-2 viral copies this assay can detect is 138 copies/mL. A negative result does not preclude SARS-Cov-2 infection and should not be used as the sole basis for treatment or other patient management decisions. A negative result may occur with  improper specimen collection/handling, submission of specimen other than nasopharyngeal swab, presence of viral mutation(s) within the areas targeted by this assay, and inadequate number of viral copies(<138 copies/mL). A negative result must be combined with clinical observations, patient history, and epidemiological information. The expected result is Negative.  Fact Sheet for Patients:  BloggerCourse.comhttps://www.fda.gov/media/152166/download  Fact Sheet for Healthcare Providers:  SeriousBroker.ithttps://www.fda.gov/media/152162/download  This test is no t yet approved or cleared by the [place] FDA and  has been authorized for detection and/or diagnosis of SARS-CoV-2 by FDA under an Emergency Use  Authorization (EUA). This EUA will remain  in effect (meaning this test can be used) for the duration of the COVID-19 declaration under Section 564(b)(1) of the Act, 21 U.S.C.section 360bbb-3(b)(1), unless the authorization is terminated  or revoked sooner.       Influenza A by PCR NEGATIVE NEGATIVE Final   Influenza B by PCR NEGATIVE NEGATIVE Final    Comment: (NOTE) The Xpert Xpress SARS-CoV-2/FLU/RSV plus assay is intended as an aid in the diagnosis of influenza from Nasopharyngeal swab specimens and should not be used as a sole basis for treatment. Nasal washings and aspirates are unacceptable for Xpert Xpress SARS-CoV-2/FLU/RSV testing.  Fact Sheet for Patients: BloggerCourse.comhttps://www.fda.gov/media/152166/download  Fact Sheet for Healthcare Providers: SeriousBroker.ithttps://www.fda.gov/media/152162/download  This test  is not yet approved or cleared by the [place] and has been authorized for detection and/or diagnosis of SARS-CoV-2 by FDA under an Emergency Use Authorization (EUA). This EUA will remain in effect (meaning this test can be used) for the duration of the COVID-19 declaration under Section 564(b)(1) of the Act, 21 U.S.C. section 360bbb-3(b)(1), unless the authorization is terminated or revoked.  Performed at [facility], [address] [place], [place] [address]   Culture, Urine     Status: None   Collection Time: 03/05/21  4:14 AM   Specimen: Urine, Clean Catch  Result Value Ref Range Status   Specimen Description   Final    URINE, CLEAN CATCH Performed at [facility], [address], [place], [place] [address]    Special Requests   Final    NONE Performed at [facility], [address] [place], [place] [address]    Culture   Final    NO GROWTH Performed at [facility], [address] [place], [place] [address]    Report Status 03/06/2021 FINAL  Final          Radiology Studies: MR BRAIN  W WO CONTRAST  Result Date: 03/08/2021 CLINICAL DATA:  Initial evaluation for altered mental status, unknown cause. EXAM: MRI HEAD WITHOUT AND WITH CONTRAST TECHNIQUE: Multiplanar, multiecho pulse sequences of the brain and surrounding structures were obtained without and with intravenous contrast. CONTRAST:  96mL GADAVIST GADOBUTROL 1 MMOL/ML IV SOLN COMPARISON:  Prior CT from 03/04/2021. FINDINGS: Brain: Examination moderately degraded by motion artifact. Cerebral volume within normal limits. Scattered patchy T2/FLAIR hyperintensity seen within the periventricular and deep white matter both cerebral hemispheres, most consistent with chronic small vessel ischemic disease, mild in nature. No other focal parenchymal signal abnormality. No abnormal foci of restricted diffusion to suggest acute or subacute ischemia. Gray-white matter differentiation maintained. No encephalomalacia to suggest chronic cortical infarction. No foci of susceptibility artifact to suggest acute or chronic intracranial hemorrhage. No mass lesion, midline shift or mass effect. No hydrocephalus or extra-axial fluid collection. Pituitary gland and suprasellar region within normal limits. Midline structures intact. No definite pathologic enhancement. Vascular: Major intracranial vascular flow voids are grossly maintained at the skull base. Skull and upper cervical spine: Craniocervical junction within normal limits. Bone marrow signal intensity grossly normal. No scalp soft tissue abnormality. Sinuses/Orbits: Globes and orbital soft tissues demonstrate no acute finding. Paranasal sinuses are largely clear. Left mastoid effusion present. Visualized nasopharynx within normal limits. Other: None. IMPRESSION: 1. Motion degraded exam. 2. No acute intracranial abnormality. 3. Mild chronic microvascular ischemic disease for age. 4. Left mastoid effusion, of uncertain significance. Correlation with physical exam suggested. Electronically Signed   By:  Rise Mu M.D.   On: 03/08/2021 03:03   CT ABDOMEN PELVIS W CONTRAST  Result Date: 03/07/2021 CLINICAL DATA:  60 year old female with abdominal pain. Concern for hernia. EXAM: CT ABDOMEN AND PELVIS WITH CONTRAST TECHNIQUE: Multidetector CT imaging of the abdomen and pelvis was performed using the standard protocol following bolus administration of intravenous contrast. CONTRAST:  65mL OMNIPAQUE IOHEXOL 300 MG/ML  SOLN COMPARISON:  Abdominal ultrasound dated 03/05/2021. FINDINGS: Lower chest: The visualized lung bases are clear. No intra-abdominal free air or free fluid. Hepatobiliary: No focal liver abnormality is seen. No gallstones, gallbladder wall thickening, or biliary dilatation. Pancreas: Unremarkable. No pancreatic ductal dilatation or surrounding inflammatory changes. Spleen: Normal in size without focal abnormality. Adrenals/Urinary Tract: The adrenal glands unremarkable. Subcentimeter  right renal upper pole hypodense focus is too small to characterize. There is right renal upper pole cortical scarring. There is no hydronephrosis on either side. There is symmetric enhancement and excretion of contrast by both kidneys. The visualized ureters and the urinary bladder appear unremarkable. Stomach/Bowel: There is a small hiatal hernia. There is sigmoid diverticulosis without active inflammatory changes. There is no bowel obstruction or active inflammation. The appendix is normal. Vascular/Lymphatic: Mild aortoiliac atherosclerotic disease. The IVC is unremarkable. No portal venous gas. There is no adenopathy. Reproductive: The uterus is anteverted and grossly unremarkable. No adnexal masses. Other: None Musculoskeletal: Mild subcutaneous edema of the left buttock. No fluid collection. Faint 15 mm heterogeneous focus involving the lateral aspect of the left gluteus maximus over the greater trochanter of the femur (coronal 120/5 and axial 79/2) may be artifactual or represent a focal muscular  contusion or traumatic injury. Correlation with clinical exam and point tenderness recommended. No fluid collection or large hematoma. No acute osseous pathology. IMPRESSION: 1. No acute intra-abdominal or pelvic pathology. No bowel obstruction. Normal appendix. 2. Sigmoid diverticulosis. 3. Artifact versus a small focal contusion or muscular injury in the lateral left gluteus maximus. Correlation with clinical exam and point tenderness recommended. No fluid collection or hematoma. 4. Aortic Atherosclerosis (ICD10-I70.0). Electronically Signed   By: Elgie Collard M.D.   On: 03/07/2021 20:40   EEG adult  Result Date: 03/07/2021 Charlsie Quest, MD     03/07/2021  3:27 PM Patient Name: Michaela Wells MRN: [id number] Epilepsy Attending: Charlsie Quest Referring Physician/Provider: Dr. Blanchard Mane Date: 03/07/2021 Duration: 23.21 mins Patient history: 60 year old female with altered mental status.  EEG to evaluate seizures. Level of alertness: Awake AEDs during EEG study: Klonopin Technical aspects: This EEG study was done with scalp electrodes positioned according to the 10-20 International system of electrode placement. Electrical activity was acquired at a sampling rate of 500Hz  and reviewed with a high frequency filter of 70Hz  and a low frequency filter of 1Hz . EEG data were recorded continuously and digitally stored. Description: The posterior dominant rhythm consists of 8 Hz activity of moderate voltage (25-35 uV) seen predominantly in posterior head regions, symmetric and reactive to eye opening and eye closing. Hyperventilation and photic stimulation were not performed.   IMPRESSION: This study is within normal limits. No seizures or epileptiform discharges were seen throughout the recording. Priyanka   DG ESOPHAGUS W SINGLE CM (SOL OR THIN BA)  Result Date: 03/06/2021 CLINICAL DATA:  Limited study due to patient immobility. EXAM: ESOPHOGRAM/BARIUM SWALLOW TECHNIQUE: Single contrast  examination was performed using  thin barium. FLUOROSCOPY TIME:  Fluoroscopy Time:  2 minutes and 6 seconds. Radiation Exposure Index (if provided by the fluoroscopic device): 27 mGy Number of Acquired Spot Images: COMPARISON:  None. FINDINGS: Single contrast imaging shows no gross esophageal mass or stricture. No esophageal diverticulum. Small hiatal hernia. Mass-effect noted in the proximal stomach, potentially from food. Gastric emptying is prompt. IMPRESSION: Limited study due to patient immobility. No gross esophageal mass lesion or stricture. Small hiatal hernia. Filling defect in the stomach likely represents food material. Further evaluation with upper GI series after an appropriate period of fasting could be used to further evaluate as clinically warranted. This study should be deferred until the patient is able to better cooperate with positioning. If immediate assessment is required, CT abdomen/pelvis could be used to further evaluate. Electronically Signed   By: M.D.   On: 03/06/2021 14:58  Scheduled Meds:  benztropine  1 mg Oral BID   calcium-vitamin D  2 tablet Oral TID   clonazePAM  0.5 mg Oral BID   enoxaparin (LOVENOX) injection  40 mg Subcutaneous Q24H   feeding supplement  237 mL Oral BID BM   fluticasone  2 spray Each Nare Daily   folic acid  1 mg Oral Daily   metoprolol tartrate  12.5 mg Oral BID   multivitamin with minerals  1 tablet Oral Daily   nicotine  21 mg Transdermal Daily   OLANZapine zydis  2.5 mg Oral BID   pantoprazole  40 mg Oral Daily   phosphorus  250 mg Oral BID   thiamine  100 mg Oral Daily   umeclidinium-vilanterol  1 puff Inhalation Daily   ziprasidone  20 mg Oral BID WC   Continuous Infusions:   LOS: 4 days    Time spent: 40 minutes  Alwyn Ren, MD 03/08/2021, 1:01 PM

## 2021-03-08 NOTE — Progress Notes (Signed)
Patient information has been sent to [facility] via secure chat to review for potential admission. Patient meets inpatient criteria per Jerelene Redden, MD.   Situation ongoing, CSW will continue to monitor progress.    Signed:  Damita Dunnings, MSW, LCSW-A  03/08/2021 12:14 PM

## 2021-03-08 NOTE — Progress Notes (Signed)
Occupational Therapy Treatment Patient Details Name: Michaela Wells MRN: [id number] DOB: [birth year] Today's Date: 03/08/2021    History of present illness patient is a 60 year old female who presented to the emergency room after a fall at home down stairs. patient was found to have severe hypokalemia, acute kidney injury,acute metabolic encephalopathy, and transaminitis. patient has been noted to have multiple hallucinations during admission. WUJ:WJXBJYN 1 disorder, PTSD, schizoaffective disorder, COPD recently hospitalized (7/4-7/5) for hypokalemia and acute kidney injury.   OT comments  Extended amount of time in room for grooming task as activity was preferred by patient. Therapist assisted with dematting hair - patient able to perform typical hair grooming. Patient able to comb hair and assist with dematting the whole time. Patient demonstrated ability to don underwear, perform in room ambulation without a device, bend over to grasp things on the floor and attend to a long task. Patient had no overt loss of balance during extended grooming task. Patient able to stand in front of mirror for 10 minutes before needing to sit. Patient overall able to perform BADLs. Needs supervision for safety due to mental illness. Patient has met goals and has no further OT needs.   Follow Up Recommendations  No OT follow up;Supervision/Assistance - 24 hour    Equipment Recommendations  None recommended by OT    Recommendations for Other Services      Precautions / Restrictions Precautions Precautions: None Restrictions Weight Bearing Restrictions: No       Mobility Bed Mobility Overal bed mobility: Independent             General bed mobility comments: Pt OOB in recliner upon entry    Transfers Overall transfer level: Modified independent Equipment used: None Transfers: Sit to/from Stand Sit to Stand: Min guard         General transfer comment: x2 from recliner; MIN guard for  safety, pt with use of armrest for power up to stand    Balance Overall balance assessment: No apparent balance deficits (not formally assessed) Sitting-balance support: Feet supported Sitting balance-Leahy Scale: Good     Standing balance support: No upper extremity supported Standing balance-Leahy Scale: Fair Standing balance comment: pt able to maintain balance without use of assistive device, but mild deficits noted with increased fatigued Single Leg Stance - Right Leg: 5 Single Leg Stance - Left Leg: 3                       ADL either performed or assessed with clinical judgement   ADL Overall ADL's : Needs assistance/impaired     Grooming: Supervision/safety;Brushing hair Grooming Details (indicate cue type and reason): Therapist assisted patient with dematting hair. While therapist managed to pull hair appart patient brushed with comb. 10 minutes in standing in front of mirror before needing to sit down.             Lower Body Dressing: Supervision/safety Lower Body Dressing Details (indicate cue type and reason): supervision to don mesh underwear Toilet Transfer: Supervision/safety Toilet Transfer Details (indicate cue type and reason): to get on and off toilet                 Vision Patient Visual Report: No change from baseline     Perception     Praxis      Cognition Arousal/Alertness: Awake/alert Behavior During Therapy: WFL for tasks assessed/performed Overall Cognitive Status: Within Functional Limits for tasks assessed  General Comments: Patient pleasant and cooperative. Able to attend to extended grooming task. Some conversation tangential but was able to state "I don't know if that was my Bipolar disorder - because it just disappeared" in regards to a whole cake before delivered to her room.              General Comments      Pertinent Vitals/ Pain       Pain Assessment: No/denies  pain      Progress Toward Goals  OT Goals(current goals can now be found in the care plan section)  Progress towards OT goals: Goals met/education completed, patient discharged from OT  Acute Rehab OT Goals Patient Stated Goal: wants to go home  Plan All goals met and education completed, patient discharged from OT services    Co-evaluation                 AM-PAC OT "6 Clicks" Daily Activity     Outcome Measure   Help from another person eating meals?: None Help from another person taking care of personal grooming?: None (for typical hair care) Help from another person toileting, which includes using toliet, bedpan, or urinal?: None Help from another person bathing (including washing, rinsing, drying)?: None Help from another person to put on and taking off regular upper body clothing?: None Help from another person to put on and taking off regular lower body clothing?: None 6 Click Score: 24    End of Session    OT Visit Diagnosis: Unsteadiness on feet (R26.81)   Activity Tolerance Patient tolerated treatment well   Patient Left in chair;with call bell/phone within reach;with chair alarm set   Nurse Communication Mobility status        Time: 3151-7616 OT Time Calculation (min): 43 min  Charges: OT General Charges $OT Visit: 1 Visit OT Treatments $Self Care/Home Management : 38-52 mins  Marvon Shillingburg, OTR/L [place]  Office [phone] Pager: [phone]    Lenward Chancellor 03/08/2021, 11:30 AM

## 2021-03-08 NOTE — Plan of Care (Signed)
Pt went for MRI; no major problem reported by tech; BP under controlled; pt slept through the night; no s/s of acute distress or pain reported or observed; pt resting comfortably in bed with call light within reach, her room is located close to nursing station and bed in lowest position for safety.

## 2021-03-08 NOTE — Progress Notes (Signed)
Physical Therapy Treatment Patient Details Name: Michaela Wells MRN: [id number] DOB: [birth year] Today's Date: 03/08/2021    History of Present Illness patient is a 60 year old female who presented to the emergency room after a fall at home down stairs. patient was found to have severe hypokalemia, acute kidney injury,acute metabolic encephalopathy, and transaminitis. patient has been noted to have multiple hallucinations during admission. WEX:HBZJIRC 1 disorder, PTSD, schizoaffective disorder, COPD recently hospitalized (7/4-7/5) for hypokalemia and acute kidney injury.    PT Comments    Pt eager for mobility today and very cooperative/appropriate. Pt noted to have poor Bilateral SLS balance only able to hold for ~3-5s before LOB which she is able to self correct. Pt is progressing toward acute PT goals with ambulation ~354ft with MIN guard for safety without use of assistive device. Pt displayed mild balance deficits when fatigued, no LOB observed. Pt will benefit from continued skilled PT for assessment/education of higher level balance activities and to maximize functional mobility to ensure safe d/c to next venue of care.    Follow Up Recommendations   [facility])     Equipment Recommendations  None recommended by PT    Recommendations for Other Services       Precautions / Restrictions Precautions Precautions: Fall Restrictions Weight Bearing Restrictions: No    Mobility  Bed Mobility               General bed mobility comments: Pt OOB in recliner upon entry    Transfers Overall transfer level: Needs assistance Equipment used: None Transfers: Sit to/from Stand Sit to Stand: Min guard         General transfer comment: x2 from recliner; MIN guard for safety, pt with use of armrest for power up to stand  Ambulation/Gait Ambulation/Gait assistance: Min guard Gait Distance (Feet): 300 Feet Assistive device: None Gait Pattern/deviations:  Step-through pattern;Drifts right/left Gait velocity: WNL   General Gait Details: Pt ambulated with MIN guard for safety without use of assistive device. Pt displayed mild drift to R x2 with verbal cue for correction and increase in lateral weight shifting ("wobbly") when fatigued after ~296ft, no LOB observed. Pt verbalizes that she is able to tell she was more "wobbly" when she is fatigued.   Stairs             Wheelchair Mobility    Modified Rankin (Stroke Patients Only)       Balance Overall balance assessment: Needs assistance;History of Falls Sitting-balance support: Feet supported Sitting balance-Leahy Scale: Good     Standing balance support: No upper extremity supported Standing balance-Leahy Scale: Fair Standing balance comment: pt able to maintain balance without use of assistive device, but mild deficits noted with increased fatigued Single Leg Stance - Right Leg: 5 Single Leg Stance - Left Leg: 3                        Cognition Arousal/Alertness: Awake/alert Behavior During Therapy: WFL for tasks assessed/performed Overall Cognitive Status: Within Functional Limits for tasks assessed                                 General Comments: Patient very cooperative during session, slightly frustrated wanting to go home and requesting to get contact info for her children so she could speak with them, RN informed.      Exercises      General  Comments        Pertinent Vitals/Pain Pain Assessment: No/denies pain    Home Living                      Prior Function            PT Goals (current goals can now be found in the care plan section) Acute Rehab PT Goals Patient Stated Goal: wants to go home PT Goal Formulation: With patient Progress towards PT goals: Progressing toward goals    Frequency    Min 2X/week      PT Plan Current plan remains appropriate    Co-evaluation              AM-PAC PT "6  Clicks" Mobility   Outcome Measure  Help needed turning from your back to your side while in a flat bed without using bedrails?: A Little Help needed moving from lying on your back to sitting on the side of a flat bed without using bedrails?: A Little Help needed moving to and from a bed to a chair (including a wheelchair)?: A Little Help needed standing up from a chair using your arms (e.g., wheelchair or bedside chair)?: A Little Help needed to walk in hospital room?: A Little Help needed climbing 3-5 steps with a railing? : A Lot 6 Click Score: 17    End of Session Equipment Utilized During Treatment: Gait belt Activity Tolerance: Patient tolerated treatment well Patient left: in chair;with call bell/phone within reach;with chair alarm set Nurse Communication: Mobility status PT Visit Diagnosis: Unsteadiness on feet (R26.81);History of falling (Z91.81)     Time: 6629-4765 PT Time Calculation (min) (ACUTE ONLY): 15 min  Charges:  $Therapeutic Activity: 8-22 mins                    Lyman Speller PT, DPT  Acute Rehabilitation Services  Office: [phone]   03/08/2021, 11:17 AM

## 2021-03-08 NOTE — TOC Progression Note (Addendum)
Transition of Care [facility]) - Progression Note    Patient Details  Name: Michaela Wells MRN: [id number] Date of Birth: Aug 17, 1961  [facility] CM/SW Contact  Erin Sons, [place] Phone Number: 03/08/2021, 11:04 AM  Clinical Narrative:     CSW called [facility]; they cannot accept  due to having out of state medicaid which they don't accept.  CSW called [facility] at [phone] for update on referral. They manage both [place] and [facility] medical centers. No answer; CSW left message requesting return call.   CSW called [facility] to check on referral (admissions # is [phone]). CSW is informed that they do not have a referral. CSW refaxed referral to [phone]  1129: CSW contacted [facility] and requested that pt be reviewed for [facility] again as she is walking around 300 feet with min guard.   1154: Due to pt's out of state insurance; local facilities may be less likely to accept inpatient psych referral. CSW faxed referral to [facility] in [place]. Fax# [phone]  1430: CSW called [facility] inquiring about referral; CSW is informed that they are not currently accepting admissions due to being at capacity  1452: CSW called [facility] at [phone]; fax# [phone]. CSW is informed that they are at capacity. CSW is also informed that they do not accept pt's across state lines.    1458: CSW informed by [facility] that decision/review still pending.   Expected Discharge Plan: Psychiatric Hospital Barriers to Discharge: Continued Medical Work up  Expected Discharge Plan and Services Expected Discharge Plan: Psychiatric Hospital         Expected Discharge Date:  (unknown)                                     Social Determinants of Health (SDOH) Interventions    Readmission Risk Interventions No flowsheet data found.

## 2021-03-09 LAB — CBC
HCT: 41.4 % (ref 36.0–46.0)
Hemoglobin: 13.4 g/dL (ref 12.0–15.0)
MCH: 29.8 pg (ref 26.0–34.0)
MCHC: 32.4 g/dL (ref 30.0–36.0)
MCV: 92 fL (ref 80.0–100.0)
Platelets: 305 K/uL (ref 150–400)
RBC: 4.5 MIL/uL (ref 3.87–5.11)
RDW: 14.4 % (ref 11.5–15.5)
WBC: 6.1 K/uL (ref 4.0–10.5)
nRBC: 0 % (ref 0.0–0.2)

## 2021-03-09 LAB — GLUCOSE, CAPILLARY
Glucose-Capillary: 92 mg/dL (ref 70–99)
Glucose-Capillary: 82 mg/dL (ref 70–99)
Glucose-Capillary: 92 mg/dL (ref 70–99)
Glucose-Capillary: 97 mg/dL (ref 70–99)
Glucose-Capillary: 140 mg/dL — ABNORMAL HIGH (ref 70–99)

## 2021-03-09 LAB — COMPREHENSIVE METABOLIC PANEL
ALT: 21 U/L (ref 0–44)
AST: 21 U/L (ref 15–41)
Albumin: 2.9 g/dL — ABNORMAL LOW (ref 3.5–5.0)
Alkaline Phosphatase: 57 U/L (ref 38–126)
Anion gap: 8 (ref 5–15)
BUN: 12 mg/dL (ref 6–20)
CO2: 30 mmol/L (ref 22–32)
Calcium: 8.1 mg/dL — ABNORMAL LOW (ref 8.9–10.3)
Chloride: 102 mmol/L (ref 98–111)
Creatinine, Ser: 1.12 mg/dL — ABNORMAL HIGH (ref 0.44–1.00)
GFR, Estimated: 56 mL/min — ABNORMAL LOW (ref >60)
Glucose, Bld: 101 mg/dL — ABNORMAL HIGH (ref 70–99)
Potassium: 3.7 mmol/L (ref 3.5–5.1)
Sodium: 140 mmol/L (ref 135–145)
Total Bilirubin: 0.5 mg/dL (ref 0.3–1.2)
Total Protein: 6.2 g/dL — ABNORMAL LOW (ref 6.5–8.1)

## 2021-03-09 LAB — RPR: RPR Ser Ql: NONREACTIVE

## 2021-03-09 MED ORDER — ZIPRASIDONE HCL 20 MG PO CAPS
20.0000 mg | ORAL_CAPSULE | Freq: Every day | ORAL | Status: DC
  Administered 2021-03-10 – 2021-03-12 (×3): 20 mg via ORAL
  Filled 2021-03-09 (×3): qty 1

## 2021-03-09 MED ORDER — OLANZAPINE 5 MG PO TBDP
5.0000 mg | ORAL_TABLET | Freq: Two times a day (BID) | ORAL | Status: DC
  Administered 2021-03-09 – 2021-03-13 (×8): 5 mg via ORAL
  Filled 2021-03-09 (×8): qty 1

## 2021-03-09 NOTE — BH Assessment (Signed)
Disposition Update:  Received a call from "Misty Stanley" with [name] @2300 . Patient is under review for consideration of bed placement at this facility. However, the facility would like a faxed copy of the following: Potassium level, Updated vital signs, Documentation of Medical Clearance, Demographics, and a Copy of her IVC documentation. Contact # #[phone]. Nursing provided updates. Also, requested nursing to notify LCSW assigned to patient.

## 2021-03-09 NOTE — Progress Notes (Addendum)
Mobility Specialist - Progress Note    03/09/21 1647  Mobility  Activity Ambulated in hall  Level of Assistance Contact guard assist, steadying assist  Assistive Device None  Distance Ambulated (ft) 750 ft  Mobility Ambulated with assistance in hallway  Mobility Response Tolerated well  Mobility performed by Mobility specialist  $Mobility charge 1 Mobility    Pt ambulated 750 ft in hallway with no assistive device and prefers to wear shoes during ambulation. Contact guard necessary when ambulating because pt shoes cause her to trip on occasion. Pt did show signs of SOB when ambulating and reported feeling "light headed" towards end of ambulation. Pt returned to bed after session and was left with call bell at side and bed alarm on.  Arliss Journey Mobility Specialist Acute Rehabilitation Services Office: [phone] 03/09/21, 4:51 PM

## 2021-03-09 NOTE — Progress Notes (Signed)
Patient has been denied by [facility] due to no availability. Patient meets inpatient criteria per Alwyn Ren, MD. Patient referred to the following facilities:  [facility]  [address], [place] [address] [phone]  [facility] [address]  [address] [place] [address] [phone]  [facility]  [address] [place], [place] [address] [phone]  [facility]  [address] [place] [address] [phone]  [facility]  [address] [place] [address] [phone]  [facility]  [address] [place], [place] [address] [phone]  [facility]  [address] [place] [address] [phone]  [facility]  [address], [place] [address] [phone]  [facility]  [address] [place] [address] [phone]  [facility]  [address] [place] [address] [phone]  [facility]  [address] [place] [address] [phone]  [facility]  [address], [place] [address] [phone]  [facility]  [address], [place] [address] [phone]  [facility]  [address], [place] [address] [phone]  [facility]  [address] [place], [place] [address] [phone]  [facility]  [address], [place] [address] [phone]  [facility]  [address], [place] [address] [phone]  [phone]    CSW will continue to monitor disposition.    Damita Dunnings, MSW, LCSW-A  3:17 PM 03/09/2021

## 2021-03-09 NOTE — Consult Note (Signed)
[facility] Face-to-Face Psychiatry Consult   Reason for Consult: Schizoaffective, hallucinations  Referring Physician:  MD Jerolyn Center Patient Identification: Michaela Wells MRN:  [id number] Principal Diagnosis: Hypokalemia Diagnosis:  Principal Problem:   Hypokalemia Active Problems:   COPD (chronic obstructive pulmonary disease) (HCC)   GAD (generalized anxiety disorder)   GERD (gastroesophageal reflux disease)   Bipolar I disorder, current or most recent episode depressed, with psychotic features (HCC)   PTSD (post-traumatic stress disorder)   MDD (major depressive disorder)   AKI (acute kidney injury) (HCC)   Dehydration   Transaminitis   Auditory hallucination   Visual hallucinations   Acute metabolic encephalopathy   Total Time spent with patient: 15 minutes  Subjective:   Michaela Wells is a 60 y.o. female continue to present with paranoia and delusions.  She states " my daughter got a job here so she can try to kill me.  Everyone heard her having sex with the doctor so she can tell them what medications to give me. " She reports a history of bipolar disorder.  Michaela Wells is requesting to be discharged soon, as she reported that her son as bought her a house in [place]. Stated she has fears that her daughter will come and kill and take all of her money. " She is out to get me." She denied suicidal or homicidal ideations. She is alert and oriented to self and place only.   Chart reviewed: Adjustments made to current medications.  We will increase Zyprexa 2.5 mg  mg to 5 mg p.o. twice daily and decrease Geodon 20 mg p.o. twice daily to daily.  Continue to monitor EKG. CSW continue seeking inpatient admission.   HPI: Per admission assessment note:  Michaela Wells is a 60 y.o. female with medical history significant of bipolar 1 disorder, PTSD, schizo affective disorder, COPD recently hospitalized 02/26/2021-02/27/2021 for hypokalemia/acute kidney injury and noted at that time to have  hallucinations who presents back to the ED after a fall this morning. Patient also noted to be having worsening auditory and visual hallucinations stating that the mother sees a guy with horses outside and is also concerned that someone is going to poison her and her sister was trying to kill her.  It is also noted that patient feels her neighbors have placed a spell on her heart.  It is also noted that patient has had a 20 pound weight loss over the past month.  Past Psychiatric History:   Risk to Self:   Risk to Others:   Prior Inpatient Therapy:   Prior Outpatient Therapy:    Past Medical History:  Past Medical History:  Diagnosis Date   Bronchial asthma    Chicken pox    COPD (chronic obstructive pulmonary disease) (HCC)    Depression    Frequent headaches    GERD (gastroesophageal reflux disease)    UTI (lower urinary tract infection)     Past Surgical History:  Procedure Laterality Date   ESOPHAGEAL DILATION     TONSILLECTOMY     Family History:  Family History  Problem Relation Age of Onset   Other Mother        MVA-Deceased [pt was age 54]   COPD Father        Deceased   Heart attack Maternal Grandmother    Stroke Maternal Grandmother    Brain cancer Maternal Grandmother    Arthritis/Rheumatoid Maternal Grandmother    Heart attack Maternal Grandfather    Stroke Maternal Grandfather  Heart disease Maternal Aunt    Stroke Maternal Aunt        #1   Heart attack Maternal Aunt        #1   Hypertension Maternal Aunt        #1   Arthritis/Rheumatoid Sister    COPD Sister    Heart disease Maternal Uncle    Heart attack Maternal Uncle        x4   Heart disease Brother        #1   Heart attack Brother        #1   Diabetes Brother    Diabetes Maternal Aunt    Mental retardation Maternal Aunt    Healthy Daughter        x2   Drug abuse Daughter        #1   Drug abuse Daughter        #2   Kidney Stones Daughter        #2   Other Son        Agoraphobia    Family Psychiatric  History:  Social History:  Social History   Substance and Sexual Activity  Alcohol Use Yes     Social History   Substance and Sexual Activity  Drug Use Not Currently    Social History   Socioeconomic History   Marital status: Single    Spouse name: Not on file   Number of children: Not on file   Years of education: Not on file   Highest education level: Not on file  Occupational History   Not on file  Tobacco Use   Smoking status: Every Day   Smokeless tobacco: Never  Substance and Sexual Activity   Alcohol use: Yes   Drug use: Not Currently   Sexual activity: Not Currently  Other Topics Concern   Not on file  Social History Narrative   Not on file   Social Determinants of Health   Financial Resource Strain: Not on file  Food Insecurity: Not on file  Transportation Needs: Not on file  Physical Activity: Not on file  Stress: Not on file  Social Connections: Not on file   Additional Social History:    Allergies:   Allergies  Allergen Reactions   Penicillins Other (See Comments)    Childhood allergy- Reaction?? Has patient had a PCN reaction causing immediate rash, facial/tongue/throat swelling, SOB or lightheadedness with hypotension: Unknown Has patient had a PCN reaction causing severe rash involving mucus membranes or skin necrosis: Unknown Has patient had a PCN reaction that required hospitalization: Unknown Has patient had a PCN reaction occurring within the last 10 years: Unknown If all of the above answers are "NO", then may proceed with Cephalosporin use.      Labs:  Results for orders placed or performed during the hospital encounter of 03/04/21 (from the past 48 hour(s))  Glucose, capillary     Status: Abnormal   Collection Time: 03/07/21  4:35 PM  Result Value Ref Range   Glucose-Capillary 104 (H) 70 - 99 mg/dL    Comment: Glucose reference range applies only to samples taken after fasting for at least 8 hours.   Glucose, capillary     Status: Abnormal   Collection Time: 03/07/21  8:03 PM  Result Value Ref Range   Glucose-Capillary 111 (H) 70 - 99 mg/dL    Comment: Glucose reference range applies only to samples taken after fasting for at least 8 hours.  Glucose, capillary  Status: Abnormal   Collection Time: 03/07/21 11:42 PM  Result Value Ref Range   Glucose-Capillary 114 (H) 70 - 99 mg/dL    Comment: Glucose reference range applies only to samples taken after fasting for at least 8 hours.  Glucose, capillary     Status: Abnormal   Collection Time: 03/08/21  4:10 AM  Result Value Ref Range   Glucose-Capillary 110 (H) 70 - 99 mg/dL    Comment: Glucose reference range applies only to samples taken after fasting for at least 8 hours.  Glucose, capillary     Status: Abnormal   Collection Time: 03/08/21  7:55 AM  Result Value Ref Range   Glucose-Capillary 105 (H) 70 - 99 mg/dL    Comment: Glucose reference range applies only to samples taken after fasting for at least 8 hours.  Valproic acid level     Status: None   Collection Time: 03/08/21 10:53 AM  Result Value Ref Range   Valproic Acid Lvl 56 50.0 - 100.0 ug/mL    Comment: Performed at [facility], [address], [place], [place] [address]  Folate, serum, performed at [facility] lab     Status: None   Collection Time: 03/08/21  1:53 PM  Result Value Ref Range   Folate 59.1 >5.9 ng/mL    Comment: RESULTS CONFIRMED BY MANUAL DILUTION Performed at [facility], [address], [place], [place] [address]   Glucose, capillary     Status: Abnormal   Collection Time: 03/08/21  8:05 PM  Result Value Ref Range   Glucose-Capillary 102 (H) 70 - 99 mg/dL    Comment: Glucose reference range applies only to samples taken after fasting for at least 8 hours.  Glucose, capillary     Status: Abnormal   Collection Time: 03/08/21 11:23 PM  Result Value Ref Range   Glucose-Capillary 104 (H) 70 - 99 mg/dL     Comment: Glucose reference range applies only to samples taken after fasting for at least 8 hours.  Glucose, capillary     Status: None   Collection Time: 03/09/21  4:04 AM  Result Value Ref Range   Glucose-Capillary 92 70 - 99 mg/dL    Comment: Glucose reference range applies only to samples taken after fasting for at least 8 hours.  Glucose, capillary     Status: None   Collection Time: 03/09/21  7:21 AM  Result Value Ref Range   Glucose-Capillary 82 70 - 99 mg/dL    Comment: Glucose reference range applies only to samples taken after fasting for at least 8 hours.   Comment 1 Notify RN    Comment 2 Document in Chart     Current Facility-Administered Medications  Medication Dose Route Frequency Provider Last Rate Last Admin   acetaminophen (TYLENOL) 160 MG/5ML solution 650 mg  650 mg Oral Q6H PRN Alwyn Ren, MD   650 mg at 03/07/21 2046   Or   acetaminophen (TYLENOL) suppository 650 mg  650 mg Rectal Q6H PRN Alwyn Ren, MD       albuterol (PROVENTIL) (2.5 MG/3ML) 0.083% nebulizer solution 3 mL  3 mL Inhalation Q4H PRN Rodolph Bong, MD       benztropine (COGENTIN) tablet 1 mg  1 mg Oral BID Rodolph Bong, MD   1 mg at 03/09/21 1038   bisacodyl (DULCOLAX) EC tablet 10 mg  10 mg Oral Daily Alwyn Ren, MD   10 mg at 03/09/21 1037  clonazePAM (KLONOPIN) tablet 0.5 mg  0.5 mg Oral BID Rodolph Bong, MD   0.5 mg at 03/09/21 1038   enoxaparin (LOVENOX) injection 40 mg  40 mg Subcutaneous Q24H Rodolph Bong, MD   40 mg at 03/08/21 2154   feeding supplement (ENSURE ENLIVE / ENSURE PLUS) liquid 237 mL  237 mL Oral BID BM Rodolph Bong, MD   237 mL at 03/08/21 0944   fluticasone (FLONASE) 50 MCG/ACT nasal spray 2 spray  2 spray Each Nare Daily Rodolph Bong, MD   2 spray at 03/09/21 1046   folic acid (FOLVITE) tablet 1 mg  1 mg Oral Daily Rodolph Bong, MD   1 mg at 03/09/21 1038   hydrALAZINE (APRESOLINE) injection 5 mg  5 mg  Intravenous Q6H PRN Rodolph Bong, MD   5 mg at 03/07/21 2005   metoprolol tartrate (LOPRESSOR) tablet 12.5 mg  12.5 mg Oral BID Alwyn Ren, MD   12.5 mg at 03/09/21 1042   multivitamin with minerals tablet 1 tablet  1 tablet Oral Daily Rodolph Bong, MD   1 tablet at 03/09/21 1038   nicotine (NICODERM CQ - dosed in mg/24 hours) patch 21 mg  21 mg Transdermal Daily Rodolph Bong, MD   21 mg at 03/09/21 1042   OLANZapine zydis (ZYPREXA) disintegrating tablet 2.5 mg  2.5 mg Oral BID Maryagnes Amos, FNP   2.5 mg at 03/09/21 1039   ondansetron (ZOFRAN) tablet 4 mg  4 mg Oral Q6H PRN Rodolph Bong, MD       Or   ondansetron [facility]) injection 4 mg  4 mg Intravenous Q6H PRN Rodolph Bong, MD   4 mg at 03/07/21 1710   pantoprazole (PROTONIX) EC tablet 40 mg  40 mg Oral Daily Rexford Maus, RPH   40 mg at 03/09/21 1038   polyethylene glycol (MIRALAX / GLYCOLAX) packet 17 g  17 g Oral Daily Alwyn Ren, MD   17 g at 03/09/21 1036   senna-docusate (Senokot-S) tablet 2 tablet  2 tablet Oral BID Alwyn Ren, MD   2 tablet at 03/09/21 1038   thiamine tablet 100 mg  100 mg Oral Daily Rodolph Bong, MD   100 mg at 03/09/21 1043   umeclidinium-vilanterol (ANORO ELLIPTA) 62.5-25 MCG/INH 1 puff  1 puff Inhalation Daily Rodolph Bong, MD   1 puff at 03/09/21 0759   ziprasidone (GEODON) capsule 20 mg  20 mg Oral BID WC Maryagnes Amos, FNP   20 mg at 03/09/21 0758    Musculoskeletal   Psychiatric Specialty Exam:  Presentation  General Appearance: Disheveled; Other (comment) (hair matted, big flakes in her hair. Lips dry and chafing)  Eye Contact:Fair  Speech:Clear and Coherent; Slow  Speech Volume:Normal  Handedness:Right   Mood and Affect  Mood:Euphoric  Affect:Blunt   Thought Process  Thought Processes:Irrevelant; Coherent; Linear  Descriptions of Associations:Loose  Orientation:Partial  Thought  Content:Perseveration; Delusions  History of Schizophrenia/Schizoaffective disorder:Yes  Duration of Psychotic Symptoms:Greater than six months  Hallucinations:No data recorded Ideas of Reference:Delusions  Suicidal Thoughts:No data recorded Homicidal Thoughts:No data recorded  Sensorium  Memory:Immediate Poor; Recent Poor; Remote Poor  Judgment:Impaired  Insight:Shallow   Executive Functions  Concentration:Fair  Attention Span:Fair  Recall:Fair  Fund of Knowledge:Fair  Language:Fair   Psychomotor Activity  Psychomotor Activity: No data recorded  Assets  Assets:Communication Skills; Desire for Improvement; Leisure Time; Physical Health   Sleep  Sleep: No data  recorded  Physical Exam: Physical Exam Vitals and nursing note reviewed.  Constitutional:      Appearance: Normal appearance.  Neurological:     Mental Status: She is alert.  Psychiatric:        Attention and Perception: She perceives auditory and visual hallucinations.        Mood and Affect: Mood is depressed.        Speech: Speech normal.        Behavior: Behavior is agitated.        Thought Content: Thought content is paranoid and delusional.        Cognition and Memory: Memory is impaired.   ROS Blood pressure 111/64, pulse 73, temperature 98.9 F (37.2 C), temperature source Oral, resp. rate 16, height 5\' 2"  (1.575 m), weight 75.8 kg, SpO2 98 %. Body mass index is 30.56 kg/m.  As noted on assessment 7/12- " Recommend neuro evaluation, as daughter reports patient with new onset of psychosis and hallucinations approximately 4 years ago.  Patient may also benefit from brain MRI to further assess any organic causes of psychosis. Due to apparent decompensate in schizoaffective, patient will benefit from inpatient psychiatric admission once medically stable."   Disposition: Recommend psychiatric Inpatient admission when medically cleared.  Schizoaffective:   Adjustments made to current  medications.   We will increase Zyprexa 2.5 mg  mg to 5 mg p.o. twice daily and decrease Geodon 20 mg p.o. twice daily to daily to 20 mg daily  Valproic Acid Level was 56 on 7/14 -Continue to monitor EKG.  -CSW continue seeking inpatient admission.     8/14, NP 03/09/2021 10:53 AM

## 2021-03-09 NOTE — Progress Notes (Signed)
PROGRESS NOTE    Michaela Wells  PIR:[id number] DOB: 1960-11-03 DOA: 03/04/2021 PCP: [name], NP   Brief Narrative: 60 year old female history of bipolar 1 disorder, PTSD, schizoaffective disorder, COPD recently hospitalized (7/4-7/5) for hypokalemia and acute kidney injury during the hospitalization noted to have hallucinations who presents back to the ED after a fall.  It is noted that patient is having both auditory and visual hallucinations which have worsened recently per daughter with some associated decreased oral intake and a 20 pound weight loss over the past month.  Infectious work-up done negative.  Comprehensive metabolic profile noted patient to be hypokalemic in acute kidney injury.  SARS coronavirus 2 PCR done negative.  Patient admitted for electrolyte abnormalities, acute kidney injury, auditory and visual hallucinations.  Psychiatry consulted.  Assessment & Plan:   Principal Problem:   Hypokalemia Active Problems:   COPD (chronic obstructive pulmonary disease) (HCC)   GAD (generalized anxiety disorder)   GERD (gastroesophageal reflux disease)   Bipolar I disorder, current or most recent episode depressed, with psychotic features (HCC)   PTSD (post-traumatic stress disorder)   MDD (major depressive disorder)   AKI (acute kidney injury) (HCC)   Dehydration   Transaminitis   Auditory hallucination   Visual hallucinations   Acute metabolic encephalopathy   #1 acute metabolic encephalopathy in the setting of bipolar disorder-she is admitted with auditory and visual hallucination.  Work-up so far includes infectious work-up which was negative.  CT head showed no acute abnormalities. Her Depakote level was high so that was discontinued.  Depakote level was 1 32 down to 56 today. BuSpar and hydroxyzine is on hold. Patient was started on Zyprexa.dose increased  by psych 7/15 Klonopin continued. Geodon dose decreased for psych. Psych recommending inpatient psych  admission. Checked B12 folate TSH and RPR B12  TSH folate rpr normal  MRI brain -no acute findings.  Discussed with neurology on-call.  #2 AKI likely prerenal secondary to decreased p.o. intake in the setting of oral diuretics.  Patient lost 20 pounds in the past month due to decreased p.o. intake.  Renal functions improved with hydration.  #3 severe hypokalemia resolved -which was thought to be due to HCTZ which has been stopped.  HCTZ should not be resumed on discharge.  #4 transaminitis likely secondary to dehydration.  Abdominal ultrasound shows sludge in the gallbladder without any acute cholecystitis.  LFTs trending down.  Hepatitis panel negative.  #5 odynophagia on dysphagia 3 diet.  Barium swallow unremarkable.she has no c/o today.  #6 abdominal pain -resolved.ct negative.  #7 hypertension patient was on HCTZ at home which has been stopped due to hypokalemia.  We will start the patient on Lopressor 12.5 twice daily.  Nutrition Problem: Inadequate oral intake Etiology:  (AMS)   Signs/Symptoms: per patient/family report  Interventions: Ensure Enlive (each supplement provides 350kcal and 20 grams of protein), MVI, Magic cup  Estimated body mass index is 30.56 kg/m as calculated from the following:   Height as of this encounter: 5\' 2"  (1.575 m).   Weight as of this encounter: 75.8 kg.  DVT prophylaxis: Lovenox  code Status: Full code  family Communication: None at bedside  disposition Plan:  Status is: Inpatient  Remains inpatient appropriate because:Inpatient level of care appropriate due to severity of illness  Dispo: The patient is from: Home              Anticipated d/c is to: Home  Patient currently is not medically stable to d/c.   Difficult to place patient Yes    Consultants: Psychiatry  Procedures: None Antimicrobials: None  Subjective: No bm for many days started stool softners  Objective: Vitals:   03/08/21 1435 03/08/21 2009 03/09/21  0533 03/09/21 0759  BP: 133/83 123/68 111/64   Pulse: 79 81 73   Resp: 15 20 16    Temp: 99.5 F (37.5 C) 99.6 F (37.6 C) 98.9 F (37.2 C)   TempSrc: Oral Oral Oral   SpO2: 95% 95% 99% 98%  Weight:   75.8 kg   Height:        Intake/Output Summary (Last 24 hours) at 03/09/2021 1228 Last data filed at 03/09/2021 0800 Gross per 24 hour  Intake 480 ml  Output --  Net 480 ml    Filed Weights   03/07/21 0715 03/08/21 0500 03/09/21 0533  Weight: 79.9 kg 77 kg 75.8 kg    Examination: Patient is resting in bed  Respiratory system: Clear to auscultation. Respiratory effort normal. Cardiovascular system: S1 & S2 heard, RRR. No JVD, murmurs, rubs, gallops or clicks. No pedal edema. Gastrointestinal system: Abdomen is nondistended, soft and tender. No organomegaly or masses felt. Normal bowel sounds heard. Central nervous system: Alert and oriented. No focal neurological deficits. Extremities: Symmetric 5 x 5 power. Skin: No rashes, lesions or ulcers Psychiatry: unable  to assess  Data Reviewed: I have personally reviewed following labs and imaging studies  CBC: Recent Labs  Lab 03/04/21 1410 03/05/21 0452 03/06/21 0501 03/07/21 0508  WBC 5.3 3.5* 4.0 6.0  NEUTROABS 3.0  --   --   --   HGB 12.3 12.0 12.7 14.5  HCT 36.6 37.6 39.5 44.2  MCV 89.7 92.8 92.9 91.5  PLT 202 200 254 304    Basic Metabolic Panel: Recent Labs  Lab 03/04/21 1410 03/04/21 1843 03/05/21 0452 03/06/21 0501 03/07/21 0508  NA 139  --  139 143 139  K 2.4*  --  3.4* 3.5 4.3  CL 102  --  104 110 103  CO2 26  --  25 26 24   GLUCOSE 83  --  58* 93 106*  BUN 20  --  15 7 <5*  CREATININE 1.47*  --  1.12* 1.00 1.09*  CALCIUM 7.3*  --  7.5* 7.8* 8.5*  MG  --  2.3 2.2 2.2 2.0  PHOS  --   --  2.8 2.3* 3.2    GFR: Estimated Creatinine Clearance: 52.3 mL/min (A) (by C-G formula based on SCr of 1.09 mg/dL (H)). Liver Function Tests: Recent Labs  Lab 03/04/21 1410 03/05/21 0452 03/06/21 0501  03/07/21 0508  AST 112* 92* 59* 52*  ALT 46* 42 36 38  ALKPHOS 50 58 59 64  BILITOT 0.6 0.5 0.3 0.9  PROT 5.7* 6.1* 6.0* 6.7  ALBUMIN 2.8* 3.2* 3.0* 3.4*    No results for input(s): LIPASE, AMYLASE in the last 168 hours. Recent Labs  Lab 03/04/21 1843  AMMONIA 26    Coagulation Profile: No results for input(s): INR, PROTIME in the last 168 hours. Cardiac Enzymes: No results for input(s): CKTOTAL, CKMB, CKMBINDEX, TROPONINI in the last 168 hours. BNP (last 3 results) No results for input(s): PROBNP in the last 8760 hours. HbA1C: No results for input(s): HGBA1C in the last 72 hours. CBG: Recent Labs  Lab 03/08/21 0755 03/08/21 2005 03/08/21 2323 03/09/21 0404 03/09/21 0721  GLUCAP 105* 102* 104* 92 82    Lipid Profile: No results for  input(s): CHOL, HDL, LDLCALC, TRIG, CHOLHDL, LDLDIRECT in the last 72 hours. Thyroid Function Tests: No results for input(s): TSH, T4TOTAL, FREET4, T3FREE, THYROIDAB in the last 72 hours. Anemia Panel: Recent Labs    03/08/21 1353  FOLATE 59.1   Sepsis Labs: No results for input(s): PROCALCITON, LATICACIDVEN in the last 168 hours.  Recent Results (from the past 240 hour(s))  Resp Panel by RT-PCR (Flu A&B, Covid) Nasopharyngeal Swab     Status: None   Collection Time: 03/04/21  2:28 PM   Specimen: Nasopharyngeal Swab; Nasopharyngeal(NP) swabs in vial transport medium  Result Value Ref Range Status   SARS Coronavirus 2 by RT PCR NEGATIVE NEGATIVE Final    Comment: (NOTE) SARS-CoV-2 target nucleic acids are NOT DETECTED.  The SARS-CoV-2 RNA is generally detectable in upper respiratory specimens during the acute phase of infection. The lowest concentration of SARS-CoV-2 viral copies this assay can detect is 138 copies/mL. A negative result does not preclude SARS-Cov-2 infection and should not be used as the sole basis for treatment or other patient management decisions. A negative result may occur with  improper specimen  collection/handling, submission of specimen other than nasopharyngeal swab, presence of viral mutation(s) within the areas targeted by this assay, and inadequate number of viral copies(<138 copies/mL). A negative result must be combined with clinical observations, patient history, and epidemiological information. The expected result is Negative.  Fact Sheet for Patients:  BloggerCourse.com  Fact Sheet for Healthcare Providers:  SeriousBroker.it  This test is no t yet approved or cleared by the [place] FDA and  has been authorized for detection and/or diagnosis of SARS-CoV-2 by FDA under an Emergency Use Authorization (EUA). This EUA will remain  in effect (meaning this test can be used) for the duration of the COVID-19 declaration under Section 564(b)(1) of the Act, 21 U.S.C.section 360bbb-3(b)(1), unless the authorization is terminated  or revoked sooner.       Influenza A by PCR NEGATIVE NEGATIVE Final   Influenza B by PCR NEGATIVE NEGATIVE Final    Comment: (NOTE) The Xpert Xpress SARS-CoV-2/FLU/RSV plus assay is intended as an aid in the diagnosis of influenza from Nasopharyngeal swab specimens and should not be used as a sole basis for treatment. Nasal washings and aspirates are unacceptable for Xpert Xpress SARS-CoV-2/FLU/RSV testing.  Fact Sheet for Patients: BloggerCourse.com  Fact Sheet for Healthcare Providers: SeriousBroker.it  This test is not yet approved or cleared by the [place] FDA and has been authorized for detection and/or diagnosis of SARS-CoV-2 by FDA under an Emergency Use Authorization (EUA). This EUA will remain in effect (meaning this test can be used) for the duration of the COVID-19 declaration under Section 564(b)(1) of the Act, 21 U.S.C. section 360bbb-3(b)(1), unless the authorization is terminated or revoked.  Performed at [facility], [address], [place], [place] [address]   Culture, Urine     Status: None   Collection Time: 03/05/21  4:14 AM   Specimen: Urine, Clean Catch  Result Value Ref Range Status   Specimen Description   Final    URINE, CLEAN CATCH Performed at [facility], [address], [place], [place] [address]    Special Requests   Final    NONE Performed at [facility], [address], [place], [place] [address]    Culture   Final    NO GROWTH Performed at [facility], [address] [place], [place] [address]    Report  Status 03/06/2021 FINAL  Final          Radiology Studies: MR BRAIN W WO CONTRAST  Result Date: 03/08/2021 CLINICAL DATA:  Initial evaluation for altered mental status, unknown cause. EXAM: MRI HEAD WITHOUT AND WITH CONTRAST TECHNIQUE: Multiplanar, multiecho pulse sequences of the brain and surrounding structures were obtained without and with intravenous contrast. CONTRAST:  8mL GADAVIST GADOBUTROL 1 MMOL/ML IV SOLN COMPARISON:  Prior CT from 03/04/2021. FINDINGS: Brain: Examination moderately degraded by motion artifact. Cerebral volume within normal limits. Scattered patchy T2/FLAIR hyperintensity seen within the periventricular and deep white matter both cerebral hemispheres, most consistent with chronic small vessel ischemic disease, mild in nature. No other focal parenchymal signal abnormality. No abnormal foci of restricted diffusion to suggest acute or subacute ischemia. Gray-white matter differentiation maintained. No encephalomalacia to suggest chronic cortical infarction. No foci of susceptibility artifact to suggest acute or chronic intracranial hemorrhage. No mass lesion, midline shift or mass effect. No hydrocephalus or extra-axial fluid collection. Pituitary gland and suprasellar region within normal limits. Midline structures intact. No definite pathologic enhancement. Vascular: Major  intracranial vascular flow voids are grossly maintained at the skull base. Skull and upper cervical spine: Craniocervical junction within normal limits. Bone marrow signal intensity grossly normal. No scalp soft tissue abnormality. Sinuses/Orbits: Globes and orbital soft tissues demonstrate no acute finding. Paranasal sinuses are largely clear. Left mastoid effusion present. Visualized nasopharynx within normal limits. Other: None. IMPRESSION: 1. Motion degraded exam. 2. No acute intracranial abnormality. 3. Mild chronic microvascular ischemic disease for age. 4. Left mastoid effusion, of uncertain significance. Correlation with physical exam suggested. Electronically Signed   By: Rise Mu M.D.   On: 03/08/2021 03:03   CT ABDOMEN PELVIS W CONTRAST  Result Date: 03/07/2021 CLINICAL DATA:  60 year old female with abdominal pain. Concern for hernia. EXAM: CT ABDOMEN AND PELVIS WITH CONTRAST TECHNIQUE: Multidetector CT imaging of the abdomen and pelvis was performed using the standard protocol following bolus administration of intravenous contrast. CONTRAST:  80mL OMNIPAQUE IOHEXOL 300 MG/ML  SOLN COMPARISON:  Abdominal ultrasound dated 03/05/2021. FINDINGS: Lower chest: The visualized lung bases are clear. No intra-abdominal free air or free fluid. Hepatobiliary: No focal liver abnormality is seen. No gallstones, gallbladder wall thickening, or biliary dilatation. Pancreas: Unremarkable. No pancreatic ductal dilatation or surrounding inflammatory changes. Spleen: Normal in size without focal abnormality. Adrenals/Urinary Tract: The adrenal glands unremarkable. Subcentimeter right renal upper pole hypodense focus is too small to characterize. There is right renal upper pole cortical scarring. There is no hydronephrosis on either side. There is symmetric enhancement and excretion of contrast by both kidneys. The visualized ureters and the urinary bladder appear unremarkable. Stomach/Bowel: There is a  small hiatal hernia. There is sigmoid diverticulosis without active inflammatory changes. There is no bowel obstruction or active inflammation. The appendix is normal. Vascular/Lymphatic: Mild aortoiliac atherosclerotic disease. The IVC is unremarkable. No portal venous gas. There is no adenopathy. Reproductive: The uterus is anteverted and grossly unremarkable. No adnexal masses. Other: None Musculoskeletal: Mild subcutaneous edema of the left buttock. No fluid collection. Faint 15 mm heterogeneous focus involving the lateral aspect of the left gluteus maximus over the greater trochanter of the femur (coronal 120/5 and axial 79/2) may be artifactual or represent a focal muscular contusion or traumatic injury. Correlation with clinical exam and point tenderness recommended. No fluid collection or large hematoma. No acute osseous pathology. IMPRESSION: 1. No acute intra-abdominal or pelvic pathology. No bowel obstruction. Normal appendix. 2. Sigmoid diverticulosis. 3. Artifact versus  a small focal contusion or muscular injury in the lateral left gluteus maximus. Correlation with clinical exam and point tenderness recommended. No fluid collection or hematoma. 4. Aortic Atherosclerosis (ICD10-I70.0). Electronically Signed   By: Elgie Collard M.D.   On: 03/07/2021 20:40   EEG adult  Result Date: 03/07/2021 Charlsie Quest, MD     03/07/2021  3:27 PM Patient Name: GIOVANNA KEMMERER MRN: [id number] Epilepsy Attending: Charlsie Quest Referring Physician/Provider: Dr. Blanchard Mane Date: 03/07/2021 Duration: 23.21 mins Patient history: 60 year old female with altered mental status.  EEG to evaluate seizures. Level of alertness: Awake AEDs during EEG study: Klonopin Technical aspects: This EEG study was done with scalp electrodes positioned according to the 10-20 International system of electrode placement. Electrical activity was acquired at a sampling rate of 500Hz  and reviewed with a high frequency filter of  70Hz  and a low frequency filter of 1Hz . EEG data were recorded continuously and digitally stored. Description: The posterior dominant rhythm consists of 8 Hz activity of moderate voltage (25-35 uV) seen predominantly in posterior head regions, symmetric and reactive to eye opening and eye closing. Hyperventilation and photic stimulation were not performed.   IMPRESSION: This study is within normal limits. No seizures or epileptiform discharges were seen throughout the recording. Priyanka        Scheduled Meds:  benztropine  1 mg Oral BID   bisacodyl  10 mg Oral Daily   clonazePAM  0.5 mg Oral BID   enoxaparin (LOVENOX) injection  40 mg Subcutaneous Q24H   feeding supplement  237 mL Oral BID BM   fluticasone  2 spray Each Nare Daily   folic acid  1 mg Oral Daily   metoprolol tartrate  12.5 mg Oral BID   multivitamin with minerals  1 tablet Oral Daily   nicotine  21 mg Transdermal Daily   OLANZapine zydis  5 mg Oral BID   pantoprazole  40 mg Oral Daily   polyethylene glycol  17 g Oral Daily   senna-docusate  2 tablet Oral BID   thiamine  100 mg Oral Daily   umeclidinium-vilanterol  1 puff Inhalation Daily   [START ON 03/10/2021] ziprasidone  20 mg Oral Daily   Continuous Infusions:   LOS: 5 days    Time spent: 40 minutes  , MD 03/09/2021, 12:28 PM

## 2021-03-09 NOTE — TOC Progression Note (Addendum)
Transition of Care [facility]) - Progression Note    Patient Details  Name: Michaela Wells MRN: [id number] Date of Birth: [birth year]  [facility] CM/SW Contact  Erin Sons, [place] Phone Number: 03/09/2021, 11:15 AM  Clinical Narrative:     Contacted [facility]; [facility] cannot confirm any bed availability at this time.   CSW faxed referral to [facility]   1130: CSW called pt daughter to provide update. Provided update on psych bed search. Updated her that out of state medicaid is barrier. Daughter explains that pt has been in [place] for 1 year and most of her benefits have been transitioned to [facility]. CSW requests that daughter check pt's medicaid card to see if it has been updated to [facility]. Otherwise, CSW informs her she needs to follow up with DSS to check on status of pt's medicaid. Daughter states that pt lives with her and her 1 y/o sister. She works during the day. She states that pt's symptoms worsened around 4 years ago and she has concerns regarding pt's neurological health. CSW will update daughter as needed.   Expected Discharge Plan: Psychiatric Hospital Barriers to Discharge: Continued Medical Work up  Expected Discharge Plan and Services Expected Discharge Plan: Psychiatric Hospital         Expected Discharge Date:  (unknown)                                     Social Determinants of Health (SDOH) Interventions    Readmission Risk Interventions No flowsheet data found.

## 2021-03-09 NOTE — TOC Progression Note (Addendum)
Transition of Care [facility]) - Progression Note    Patient Details  Name: Michaela Wells MRN: [id number] Date of Birth: 12-15-1960  Transition of Care [facility]) CM/SW Contact  Erin Sons, [place] Phone Number: 03/09/2021, 3:24 PM  Clinical Narrative:     [facility] cannot accept pt clinically due to pt being dysphagic , not independent with ADL's, and needing supervision for ambulation.  Likely still needs Geropsych.    CSW called [facility] to check on referral (admissions # is [phone]). They may consider taking pt with [facility] med[facility]. Admissions did not have a bed yesterday or today; they will review referral again.   CSW called and left message with [name] requesting return call regarding referral. [phone] (they are near [place] so maybe take [facility]?)  Expected Discharge Plan: Psychiatric Hospital Barriers to Discharge: Continued Medical Work up  Expected Discharge Plan and Services Expected Discharge Plan: Psychiatric Hospital         Expected Discharge Date:  (unknown)                                     Social Determinants of Health (SDOH) Interventions    Readmission Risk Interventions No flowsheet data found.

## 2021-03-10 LAB — GLUCOSE, CAPILLARY
Glucose-Capillary: 115 mg/dL — ABNORMAL HIGH (ref 70–99)
Glucose-Capillary: 127 mg/dL — ABNORMAL HIGH (ref 70–99)
Glucose-Capillary: 139 mg/dL — ABNORMAL HIGH (ref 70–99)
Glucose-Capillary: 91 mg/dL (ref 70–99)
Glucose-Capillary: 92 mg/dL (ref 70–99)
Glucose-Capillary: 92 mg/dL (ref 70–99)

## 2021-03-10 MED ORDER — LACTULOSE 10 GM/15ML PO SOLN: 30.0000 g | Freq: Once | ORAL | Status: DC

## 2021-03-10 NOTE — Progress Notes (Addendum)
PROGRESS NOTE    Michaela Wells  ZOX:[id number] DOB: [birth year] DOA: 03/04/2021 PCP: [name], NP   Brief Narrative: 60 year old female history of bipolar 1 disorder, PTSD, schizoaffective disorder, COPD recently hospitalized (7/4-7/5) for hypokalemia and acute kidney injury during the hospitalization noted to have hallucinations who presents back to the ED after a fall.  It is noted that patient is having both auditory and visual hallucinations which have worsened recently per daughter with some associated decreased oral intake and a 20 pound weight loss over the past month.  Infectious work-up done negative.  Comprehensive metabolic profile noted patient to be hypokalemic in acute kidney injury.  SARS coronavirus 2 PCR done negative.  Patient admitted for electrolyte abnormalities, acute kidney injury, auditory and visual hallucinations.  Psychiatry consulted.  Assessment & Plan:   Principal Problem:   Hypokalemia Active Problems:   COPD (chronic obstructive pulmonary disease) (HCC)   GAD (generalized anxiety disorder)   GERD (gastroesophageal reflux disease)   Bipolar I disorder, current or most recent episode depressed, with psychotic features (HCC)   PTSD (post-traumatic stress disorder)   MDD (major depressive disorder)   AKI (acute kidney injury) (HCC)   Dehydration   Transaminitis   Auditory hallucination   Visual hallucinations   Acute metabolic encephalopathy   #1 acute metabolic encephalopathy in the setting of bipolar disorder-she is admitted with auditory and visual hallucination.  Work-up so far includes infectious work-up which was negative.  CT head showed no acute abnormalities. Her Depakote level was high so that was discontinued.  Depakote level was 1 32 down to 56 today. BuSpar and hydroxyzine is on hold. Patient was started on Zyprexa.dose increased  by psych 7/15 Klonopin continued. Geodon dose decreased for psych. Psych recommending inpatient psych  admission. Checked B12 folate TSH and RPR B12  TSH folate rpr normal  MRI brain -no acute findings.  Discussed with neurology on-call.  #2 AKI likely prerenal secondary to decreased p.o. intake in the setting of oral diuretics.  Patient lost 20 pounds in the past month due to decreased p.o. intake.  Renal functions improved with hydration.  #3 severe hypokalemia resolved -which was thought to be due to HCTZ which has been stopped.  HCTZ should not be resumed on discharge.  #4 transaminitis likely secondary to dehydration.  Abdominal ultrasound shows sludge in the gallbladder without any acute cholecystitis.  LFTs trending down.  Hepatitis panel negative.  #5 odynophagia on dysphagia 3 diet.  Barium swallow unremarkable.she has no c/o today.  #6 abdominal pain -resolved.ct negative.  #7 hypertension patient was on HCTZ at home which has been stopped due to hypokalemia.  We will start the patient on Lopressor 12.5 twice daily.  Nutrition Problem: Inadequate oral intake Etiology:  (AMS)   Signs/Symptoms: per patient/family report  Interventions: Ensure Enlive (each supplement provides 350kcal and 20 grams of protein), MVI, Magic cup  Estimated body mass index is 30.81 kg/m as calculated from the following:   Height as of this encounter: 5\' 2"  (1.575 m).   Weight as of this encounter: 76.4 kg.  DVT prophylaxis: Lovenox  code Status: Full code  family Communication: None at bedside  disposition Plan:  Status is: Inpatient  Remains inpatient appropriate because:Inpatient level of care appropriate due to severity of illness  Dispo: The patient is from: Home              Anticipated d/c is to: Home  Patient currently is MEDICALLY STABLE/CLEARED FOR DC   Difficult to place patient Yes    Consultants: Psychiatry  Procedures: None Antimicrobials: None  Subjective:  STILL constipated no other c/o Objective: Vitals:   03/09/21 2307 03/10/21 0500 03/10/21 0819  03/10/21 0820  BP: 120/69     Pulse: 70     Resp:      Temp:      TempSrc:      SpO2:   95% 95%  Weight:  76.4 kg    Height:        Intake/Output Summary (Last 24 hours) at 03/10/2021 1046 Last data filed at 03/09/2021 1230 Gross per 24 hour  Intake 240 ml  Output --  Net 240 ml    Filed Weights   03/08/21 0500 03/09/21 0533 03/10/21 0500  Weight: 77 kg 75.8 kg 76.4 kg    Examination: Patient is resting in bed  Respiratory system: Clear to auscultation. Respiratory effort normal. Cardiovascular system: S1 & S2 heard, RRR. No JVD, murmurs, rubs, gallops or clicks. No pedal edema. Gastrointestinal system: Abdomen is nondistended, soft and tender. No organomegaly or masses felt. Normal bowel sounds heard. Central nervous system: Alert and oriented. No focal neurological deficits. Extremities: Symmetric 5 x 5 power. Skin: No rashes, lesions or ulcers Psychiatry: unable  to assess  Data Reviewed: I have personally reviewed following labs and imaging studies  CBC: Recent Labs  Lab 03/04/21 1410 03/05/21 0452 03/06/21 0501 03/07/21 0508 03/09/21 1249  WBC 5.3 3.5* 4.0 6.0 6.1  NEUTROABS 3.0  --   --   --   --   HGB 12.3 12.0 12.7 14.5 13.4  HCT 36.6 37.6 39.5 44.2 41.4  MCV 89.7 92.8 92.9 91.5 92.0  PLT 202 200 254 304 305    Basic Metabolic Panel: Recent Labs  Lab 03/04/21 1410 03/04/21 1843 03/05/21 0452 03/06/21 0501 03/07/21 0508 03/09/21 1249  NA 139  --  139 143 139 140  K 2.4*  --  3.4* 3.5 4.3 3.7  CL 102  --  104 110 103 102  CO2 26  --  25 26 24 30   GLUCOSE 83  --  58* 93 106* 101*  BUN 20  --  15 7 <5* 12  CREATININE 1.47*  --  1.12* 1.00 1.09* 1.12*  CALCIUM 7.3*  --  7.5* 7.8* 8.5* 8.1*  MG  --  2.3 2.2 2.2 2.0  --   PHOS  --   --  2.8 2.3* 3.2  --     GFR: Estimated Creatinine Clearance: 51.1 mL/min (A) (by C-G formula based on SCr of 1.12 mg/dL (H)). Liver Function Tests: Recent Labs  Lab 03/04/21 1410 03/05/21 0452 03/06/21 0501  03/07/21 0508 03/09/21 1249  AST 112* 92* 59* 52* 21  ALT 46* 42 36 38 21  ALKPHOS 50 58 59 64 57  BILITOT 0.6 0.5 0.3 0.9 0.5  PROT 5.7* 6.1* 6.0* 6.7 6.2*  ALBUMIN 2.8* 3.2* 3.0* 3.4* 2.9*    No results for input(s): LIPASE, AMYLASE in the last 168 hours. Recent Labs  Lab 03/04/21 1843  AMMONIA 26    Coagulation Profile: No results for input(s): INR, PROTIME in the last 168 hours. Cardiac Enzymes: No results for input(s): CKTOTAL, CKMB, CKMBINDEX, TROPONINI in the last 168 hours. BNP (last 3 results) No results for input(s): PROBNP in the last 8760 hours. HbA1C: No results for input(s): HGBA1C in the last 72 hours. CBG: Recent Labs  Lab 03/09/21 1711 03/09/21 2027  03/10/21 0012 03/10/21 0443 03/10/21 0750  GLUCAP 97 140* 92 92 91    Lipid Profile: No results for input(s): CHOL, HDL, LDLCALC, TRIG, CHOLHDL, LDLDIRECT in the last 72 hours. Thyroid Function Tests: No results for input(s): TSH, T4TOTAL, FREET4, T3FREE, THYROIDAB in the last 72 hours. Anemia Panel: Recent Labs    03/08/21 1353  FOLATE 59.1    Sepsis Labs: No results for input(s): PROCALCITON, LATICACIDVEN in the last 168 hours.  Recent Results (from the past 240 hour(s))  Resp Panel by RT-PCR (Flu A&B, Covid) Nasopharyngeal Swab     Status: None   Collection Time: 03/04/21  2:28 PM   Specimen: Nasopharyngeal Swab; Nasopharyngeal(NP) swabs in vial transport medium  Result Value Ref Range Status   SARS Coronavirus 2 by RT PCR NEGATIVE NEGATIVE Final    Comment: (NOTE) SARS-CoV-2 target nucleic acids are NOT DETECTED.  The SARS-CoV-2 RNA is generally detectable in upper respiratory specimens during the acute phase of infection. The lowest concentration of SARS-CoV-2 viral copies this assay can detect is 138 copies/mL. A negative result does not preclude SARS-Cov-2 infection and should not be used as the sole basis for treatment or other patient management decisions. A negative result may  occur with  improper specimen collection/handling, submission of specimen other than nasopharyngeal swab, presence of viral mutation(s) within the areas targeted by this assay, and inadequate number of viral copies(<138 copies/mL). A negative result must be combined with clinical observations, patient history, and epidemiological information. The expected result is Negative.  Fact Sheet for Patients:  BloggerCourse.com  Fact Sheet for Healthcare Providers:  SeriousBroker.it  This test is no t yet approved or cleared by the [place] FDA and  has been authorized for detection and/or diagnosis of SARS-CoV-2 by FDA under an Emergency Use Authorization (EUA). This EUA will remain  in effect (meaning this test can be used) for the duration of the COVID-19 declaration under Section 564(b)(1) of the Act, 21 U.S.C.section 360bbb-3(b)(1), unless the authorization is terminated  or revoked sooner.       Influenza A by PCR NEGATIVE NEGATIVE Final   Influenza B by PCR NEGATIVE NEGATIVE Final    Comment: (NOTE) The Xpert Xpress SARS-CoV-2/FLU/RSV plus assay is intended as an aid in the diagnosis of influenza from Nasopharyngeal swab specimens and should not be used as a sole basis for treatment. Nasal washings and aspirates are unacceptable for Xpert Xpress SARS-CoV-2/FLU/RSV testing.  Fact Sheet for Patients: BloggerCourse.com  Fact Sheet for Healthcare Providers: SeriousBroker.it  This test is not yet approved or cleared by the [place] FDA and has been authorized for detection and/or diagnosis of SARS-CoV-2 by FDA under an Emergency Use Authorization (EUA). This EUA will remain in effect (meaning this test can be used) for the duration of the COVID-19 declaration under Section 564(b)(1) of the Act, 21 U.S.C. section 360bbb-3(b)(1), unless the authorization is terminated  or revoked.  Performed at [facility], [address], [place], [place] [address]   Culture, Urine     Status: None   Collection Time: 03/05/21  4:14 AM   Specimen: Urine, Clean Catch  Result Value Ref Range Status   Specimen Description   Final    URINE, CLEAN CATCH Performed at [facility], [address], [place], [place] [address]    Special Requests   Final    NONE Performed at [facility], [address] [place], [place] [address]    Culture   Final  NO GROWTH Performed at [facility], [address], [place], [place] [address]    Report Status 03/06/2021 FINAL  Final          Radiology Studies: No results found.      Scheduled Meds:  benztropine  1 mg Oral BID   bisacodyl  10 mg Oral Daily   clonazePAM  0.5 mg Oral BID   enoxaparin (LOVENOX) injection  40 mg Subcutaneous Q24H   feeding supplement  237 mL Oral BID BM   fluticasone  2 spray Each Nare Daily   folic acid  1 mg Oral Daily   metoprolol tartrate  12.5 mg Oral BID   multivitamin with minerals  1 tablet Oral Daily   nicotine  21 mg Transdermal Daily   OLANZapine zydis  5 mg Oral BID   pantoprazole  40 mg Oral Daily   polyethylene glycol  17 g Oral Daily   senna-docusate  2 tablet Oral BID   thiamine  100 mg Oral Daily   umeclidinium-vilanterol  1 puff Inhalation Daily   ziprasidone  20 mg Oral Daily   Continuous Infusions:   LOS: 6 days    Time spent: 40 minutes  Alwyn Ren, MD 03/10/2021, 10:46 AM

## 2021-03-10 NOTE — Progress Notes (Signed)
CSW sent referral to [name] with updated vitals and labs.   Crissie Reese, MSW, LCSW-A, LCAS-A Phone: [phone] Disposition/TOC

## 2021-03-10 NOTE — Progress Notes (Addendum)
Received a call from "Vicky" with [facility] @ [phone]. Patient is under review for consideration of bed placement at this facility. However, the facility would like a faxed copy of the following: Potassium level, Updated vital signs, Documentation of Medical Clearance, Demographics, and a Copy of her IVC documentation. Contact # [phone] #[phone]. Nursing provided updates. Also, requested nursing to notify LCSW assigned to patient. CSW notified nurse of communications via secure chat.    Crissie Reese, MSW, LCSW-A, LCAS-A Phone: [phone] Disposition/TOC

## 2021-03-11 LAB — GLUCOSE, CAPILLARY
Glucose-Capillary: 102 mg/dL — ABNORMAL HIGH (ref 70–99)
Glucose-Capillary: 105 mg/dL — ABNORMAL HIGH (ref 70–99)
Glucose-Capillary: 124 mg/dL — ABNORMAL HIGH (ref 70–99)
Glucose-Capillary: 167 mg/dL — ABNORMAL HIGH (ref 70–99)
Glucose-Capillary: 88 mg/dL (ref 70–99)
Glucose-Capillary: 91 mg/dL (ref 70–99)

## 2021-03-11 LAB — COMPREHENSIVE METABOLIC PANEL
ALT: 18 U/L (ref 0–44)
AST: 16 U/L (ref 15–41)
Albumin: 2.9 g/dL — ABNORMAL LOW (ref 3.5–5.0)
Alkaline Phosphatase: 53 U/L (ref 38–126)
Anion gap: 8 (ref 5–15)
BUN: 13 mg/dL (ref 6–20)
CO2: 28 mmol/L (ref 22–32)
Calcium: 8.1 mg/dL — ABNORMAL LOW (ref 8.9–10.3)
Chloride: 104 mmol/L (ref 98–111)
Creatinine, Ser: 1.15 mg/dL — ABNORMAL HIGH (ref 0.44–1.00)
GFR, Estimated: 55 mL/min — ABNORMAL LOW (ref >60)
Glucose, Bld: 90 mg/dL (ref 70–99)
Potassium: 4.1 mmol/L (ref 3.5–5.1)
Sodium: 140 mmol/L (ref 135–145)
Total Bilirubin: 0.5 mg/dL (ref 0.3–1.2)
Total Protein: 6 g/dL — ABNORMAL LOW (ref 6.5–8.1)

## 2021-03-11 LAB — CBC
HCT: 39.6 % (ref 36.0–46.0)
Hemoglobin: 12.8 g/dL (ref 12.0–15.0)
MCH: 30 pg (ref 26.0–34.0)
MCHC: 32.3 g/dL (ref 30.0–36.0)
MCV: 93 fL (ref 80.0–100.0)
Platelets: 329 10*3/uL (ref 150–400)
RBC: 4.26 MIL/uL (ref 3.87–5.11)
RDW: 14.4 % (ref 11.5–15.5)
WBC: 6.7 10*3/uL (ref 4.0–10.5)
nRBC: 0 % (ref 0.0–0.2)

## 2021-03-11 LAB — ACETAMINOPHEN LEVEL: Acetaminophen (Tylenol), Serum: <10 ug/mL — ABNORMAL LOW (ref 10–30)

## 2021-03-11 LAB — PREGNANCY, URINE: Preg Test, Ur: NEGATIVE

## 2021-03-11 LAB — SARS CORONAVIRUS 2 (TAT 6-24 HRS): SARS Coronavirus 2: NEGATIVE

## 2021-03-11 LAB — ETHANOL: Alcohol, Ethyl (B): <10 mg/dL (ref <10)

## 2021-03-11 MED ORDER — ONDANSETRON 4 MG PO TBDP
4.0000 mg | ORAL_TABLET | Freq: Three times a day (TID) | ORAL | Status: DC | PRN
  Administered 2021-03-11: 4 mg via ORAL
  Filled 2021-03-11: qty 1

## 2021-03-11 NOTE — TOC Progression Note (Signed)
Transition of Care [facility]) - Progression Note    Patient Details  Name: Michaela Wells MRN: [id number] Date of Birth: 1960/11/22  Transition of Care [facility]) CM/SW Contact  Miriah Maruyama, Olegario Messier, RN Phone Number: 03/11/2021, 4:06 PM  Clinical Narrative:  Faxed w/confimation-fax#[phone] to [facility]-spoke to Marisue Ivan tel#[phone]-they have received H&P,MDnotes/Psych eval/labs,vital signs. E faxed ethanol, & tylenol results w/confirmation. Still in process:pregnancy, covid-will need to be faxed once completed.MD updated.    Expected Discharge Plan: [facility] Barriers to Discharge: Continued Medical Work up  Expected Discharge Plan and Services Expected Discharge Plan: [facility] Hospital         Expected Discharge Date:  (unknown)                                     Social Determinants of Health (SDOH) Interventions    Readmission Risk Interventions No flowsheet data found.

## 2021-03-11 NOTE — Progress Notes (Addendum)
PROGRESS NOTE    Michaela Wells  ZOX:[id number] DOB: 08/14/1961 DOA: 03/04/2021 PCP: [name], NP   Brief Narrative: 60 year old female history of bipolar 1 disorder, PTSD, schizoaffective disorder, COPD recently hospitalized (7/4-7/5) for hypokalemia and acute kidney injury during the hospitalization noted to have hallucinations who presents back to the ED after a fall.  It is noted that patient is having both auditory and visual hallucinations which have worsened recently per daughter with some associated decreased oral intake and a 20 pound weight loss over the past month.  Infectious work-up done negative.  Comprehensive metabolic profile noted patient to be hypokalemic in acute kidney injury.  SARS coronavirus 2 PCR done negative.  Patient admitted for electrolyte abnormalities, acute kidney injury, auditory and visual hallucinations.  Psychiatry consulted.  Assessment & Plan:   Principal Problem:   Hypokalemia Active Problems:   COPD (chronic obstructive pulmonary disease) (HCC)   GAD (generalized anxiety disorder)   GERD (gastroesophageal reflux disease)   Bipolar I disorder, current or most recent episode depressed, with psychotic features (HCC)   PTSD (post-traumatic stress disorder)   MDD (major depressive disorder)   AKI (acute kidney injury) (HCC)   Dehydration   Transaminitis   Auditory hallucination   Visual hallucinations   Acute metabolic encephalopathy   #1 acute metabolic encephalopathy in the setting of bipolar disorder-she is admitted with auditory and visual hallucination.  Work-up so far includes infectious work-up which was negative.  CT head showed no acute abnormalities. Her Depakote level was high so that was discontinued.  Depakote level was 1 32 down to 56 today. BuSpar and hydroxyzine is on hold. Patient was started on Zyprexa.dose increased  by psych 7/15 Klonopin continued. Geodon dose decreased for psych. Psych recommending inpatient psych  admission. Checked B12 folate TSH and RPR B12  TSH folate rpr normal  MRI brain -no acute findings.  Discussed with neurology on-call. COVID send 7/17 HCG TYLENOL ALCOHOL LEVEL ORDERED   #2 AKI likely prerenal secondary to decreased p.o. intake in the setting of oral diuretics.  Patient lost 20 pounds in the past month due to decreased p.o. intake.  Renal functions improved with hydration.  #3 severe hypokalemia resolved -which was thought to be due to HCTZ which has been stopped.  HCTZ should not be resumed on discharge.  #4 transaminitis likely secondary to dehydration.  Abdominal ultrasound shows sludge in the gallbladder without any acute cholecystitis.  LFTs trending down.  Hepatitis panel negative.  #5 odynophagia on dysphagia 3 diet.  Barium swallow unremarkable.she has no c/o today.  #6 abdominal pain -resolved.ct negative.  #7 hypertension patient was on HCTZ at home which has been stopped due to hypokalemia.  We will start the patient on Lopressor 12.5 twice daily.  Nutrition Problem: Inadequate oral intake Etiology:  (AMS)   Signs/Symptoms: per patient/family report  Interventions: Ensure Enlive (each supplement provides 350kcal and 20 grams of protein), MVI, Magic cup  Estimated body mass index is 30.65 kg/m as calculated from the following:   Height as of this encounter: 5\' 2"  (1.575 m).   Weight as of this encounter: 76 kg.  DVT prophylaxis: Lovenox  code Status: Full code  family Communication: None at bedside  disposition Plan:  Status is: Inpatient  Remains inpatient appropriate because:Inpatient level of care appropriate due to severity of illness  Dispo: The patient is from: Home              Anticipated d/c is to: Home  Patient currently is MEDICALLY STABLE/CLEARED FOR DC   Difficult to place patient Yes    Consultants: Psychiatry  Procedures: None Antimicrobials: None  Subjective: She had a large bowel movement yesterday after stool  softeners.  She has no new complaints today.  Asking to take a shower Objective: Vitals:   03/11/21 0415 03/11/21 0500 03/11/21 0733 03/11/21 0735  BP: 116/64     Pulse: (!) 58     Resp: 20     Temp: 98 F (36.7 C)     TempSrc: Oral     SpO2: 97%  97% 97%  Weight:  76 kg    Height:        Intake/Output Summary (Last 24 hours) at 03/11/2021 1311 Last data filed at 03/11/2021 1232 Gross per 24 hour  Intake 720 ml  Output --  Net 720 ml    Filed Weights   03/09/21 0533 03/10/21 0500 03/11/21 0500  Weight: 75.8 kg 76.4 kg 76 kg    Examination: Patient is resting in bed  Respiratory system: Clear to auscultation. Respiratory effort normal. Cardiovascular system: S1 & S2 heard, RRR. No JVD, murmurs, rubs, gallops or clicks. No pedal edema. Gastrointestinal system: Abdomen is nondistended, soft and tender. No organomegaly or masses felt. Normal bowel sounds heard. Central nervous system: Alert and oriented. No focal neurological deficits. Extremities: Symmetric 5 x 5 power. Skin: No rashes, lesions or ulcers Psychiatry: unable  to assess  Data Reviewed: I have personally reviewed following labs and imaging studies  CBC: Recent Labs  Lab 03/04/21 1410 03/05/21 0452 03/06/21 0501 03/07/21 0508 03/09/21 1249 03/11/21 0718  WBC 5.3 3.5* 4.0 6.0 6.1 6.7  NEUTROABS 3.0  --   --   --   --   --   HGB 12.3 12.0 12.7 14.5 13.4 12.8  HCT 36.6 37.6 39.5 44.2 41.4 39.6  MCV 89.7 92.8 92.9 91.5 92.0 93.0  PLT 202 200 254 304 305 329    Basic Metabolic Panel: Recent Labs  Lab 03/04/21 1843 03/05/21 0452 03/06/21 0501 03/07/21 0508 03/09/21 1249 03/11/21 0718  NA  --  139 143 139 140 140  K  --  3.4* 3.5 4.3 3.7 4.1  CL  --  104 110 103 102 104  CO2  --  25 26 24 30 28   GLUCOSE  --  58* 93 106* 101* 90  BUN  --  15 7 <5* 12 13  CREATININE  --  1.12* 1.00 1.09* 1.12* 1.15*  CALCIUM  --  7.5* 7.8* 8.5* 8.1* 8.1*  MG 2.3 2.2 2.2 2.0  --   --   PHOS  --  2.8 2.3* 3.2  --    --     GFR: Estimated Creatinine Clearance: 49.7 mL/min (A) (by C-G formula based on SCr of 1.15 mg/dL (H)). Liver Function Tests: Recent Labs  Lab 03/05/21 0452 03/06/21 0501 03/07/21 0508 03/09/21 1249 03/11/21 0718  AST 92* 59* 52* 21 16  ALT 42 36 38 21 18  ALKPHOS 58 59 64 57 53  BILITOT 0.5 0.3 0.9 0.5 0.5  PROT 6.1* 6.0* 6.7 6.2* 6.0*  ALBUMIN 3.2* 3.0* 3.4* 2.9* 2.9*    No results for input(s): LIPASE, AMYLASE in the last 168 hours. Recent Labs  Lab 03/04/21 1843  AMMONIA 26    Coagulation Profile: No results for input(s): INR, PROTIME in the last 168 hours. Cardiac Enzymes: No results for input(s): CKTOTAL, CKMB, CKMBINDEX, TROPONINI in the last 168 hours. BNP (last 3  results) No results for input(s): PROBNP in the last 8760 hours. HbA1C: No results for input(s): HGBA1C in the last 72 hours. CBG: Recent Labs  Lab 03/10/21 2016 03/11/21 0002 03/11/21 0416 03/11/21 0717 03/11/21 1207  GLUCAP 115* 105* 88 91 167*    Lipid Profile: No results for input(s): CHOL, HDL, LDLCALC, TRIG, CHOLHDL, LDLDIRECT in the last 72 hours. Thyroid Function Tests: No results for input(s): TSH, T4TOTAL, FREET4, T3FREE, THYROIDAB in the last 72 hours. Anemia Panel: Recent Labs    03/08/21 1353  FOLATE 59.1    Sepsis Labs: No results for input(s): PROCALCITON, LATICACIDVEN in the last 168 hours.  Recent Results (from the past 240 hour(s))  Resp Panel by RT-PCR (Flu A&B, Covid) Nasopharyngeal Swab     Status: None   Collection Time: 03/04/21  2:28 PM   Specimen: Nasopharyngeal Swab; Nasopharyngeal(NP) swabs in vial transport medium  Result Value Ref Range Status   SARS Coronavirus 2 by RT PCR NEGATIVE NEGATIVE Final    Comment: (NOTE) SARS-CoV-2 target nucleic acids are NOT DETECTED.  The SARS-CoV-2 RNA is generally detectable in upper respiratory specimens during the acute phase of infection. The lowest concentration of SARS-CoV-2 viral copies this assay can  detect is 138 copies/mL. A negative result does not preclude SARS-Cov-2 infection and should not be used as the sole basis for treatment or other patient management decisions. A negative result may occur with  improper specimen collection/handling, submission of specimen other than nasopharyngeal swab, presence of viral mutation(s) within the areas targeted by this assay, and inadequate number of viral copies(<138 copies/mL). A negative result must be combined with clinical observations, patient history, and epidemiological information. The expected result is Negative.  Fact Sheet for Patients:  BloggerCourse.com  Fact Sheet for Healthcare Providers:  SeriousBroker.it  This test is no t yet approved or cleared by the [place] FDA and  has been authorized for detection and/or diagnosis of SARS-CoV-2 by FDA under an Emergency Use Authorization (EUA). This EUA will remain  in effect (meaning this test can be used) for the duration of the COVID-19 declaration under Section 564(b)(1) of the Act, 21 U.S.C.section 360bbb-3(b)(1), unless the authorization is terminated  or revoked sooner.       Influenza A by PCR NEGATIVE NEGATIVE Final   Influenza B by PCR NEGATIVE NEGATIVE Final    Comment: (NOTE) The Xpert Xpress SARS-CoV-2/FLU/RSV plus assay is intended as an aid in the diagnosis of influenza from Nasopharyngeal swab specimens and should not be used as a sole basis for treatment. Nasal washings and aspirates are unacceptable for Xpert Xpress SARS-CoV-2/FLU/RSV testing.  Fact Sheet for Patients: BloggerCourse.com  Fact Sheet for Healthcare Providers: SeriousBroker.it  This test is not yet approved or cleared by the [place] FDA and has been authorized for detection and/or diagnosis of SARS-CoV-2 by FDA under an Emergency Use Authorization (EUA). This EUA will remain in  effect (meaning this test can be used) for the duration of the COVID-19 declaration under Section 564(b)(1) of the Act, 21 U.S.C. section 360bbb-3(b)(1), unless the authorization is terminated or revoked.  Performed at [facility], [address], [place], [place] [address]   Culture, Urine     Status: None   Collection Time: 03/05/21  4:14 AM   Specimen: Urine, Clean Catch  Result Value Ref Range Status   Specimen Description   Final    URINE, CLEAN CATCH Performed at [facility], [address] [place], [place] [address]  Special Requests   Final    NONE Performed at [facility], [address] [place], [place] [address]    Culture   Final    NO GROWTH Performed at [facility], [address] [place], [place] [address]    Report Status 03/06/2021 FINAL  Final          Radiology Studies: No results found.      Scheduled Meds:  benztropine  1 mg Oral BID   bisacodyl  10 mg Oral Daily   clonazePAM  0.5 mg Oral BID   enoxaparin (LOVENOX) injection  40 mg Subcutaneous Q24H   feeding supplement  237 mL Oral BID BM   fluticasone  2 spray Each Nare Daily   folic acid  1 mg Oral Daily   lactulose  30 g Oral Once   metoprolol tartrate  12.5 mg Oral BID   multivitamin with minerals  1 tablet Oral Daily   nicotine  21 mg Transdermal Daily   OLANZapine zydis  5 mg Oral BID   pantoprazole  40 mg Oral Daily   polyethylene glycol  17 g Oral Daily   thiamine  100 mg Oral Daily   umeclidinium-vilanterol  1 puff Inhalation Daily   ziprasidone  20 mg Oral Daily   Continuous Infusions:   LOS: 7 days    Time spent: 40 minutes  Alwyn RenElizabeth G Sufian Ravi, MD 03/11/2021, 1:11 PM

## 2021-03-11 NOTE — Progress Notes (Signed)
CSW contacted [name] in reference to a follow-up on referral. Misty Stanley with [name] was requesting updated information. CSW spoke with floor charge nurse discussing communications with [name] in reference to this patient and provided the number to [name] to speak with the nurse directly about medical information needed directly.   Crissie Reese, MSW, LCSW-A, LCAS-A Phone: [phone] Disposition/TOC

## 2021-03-12 LAB — GLUCOSE, CAPILLARY
Glucose-Capillary: 104 mg/dL — ABNORMAL HIGH (ref 70–99)
Glucose-Capillary: 105 mg/dL — ABNORMAL HIGH (ref 70–99)
Glucose-Capillary: 106 mg/dL — ABNORMAL HIGH (ref 70–99)
Glucose-Capillary: 120 mg/dL — ABNORMAL HIGH (ref 70–99)
Glucose-Capillary: 82 mg/dL (ref 70–99)
Glucose-Capillary: 93 mg/dL (ref 70–99)

## 2021-03-12 NOTE — Consult Note (Signed)
[facility] Face-to-Face Psychiatry Consult   Reason for Consult: Schizoaffective, hallucinations  Referring Physician:  MD Jerolyn Center Patient Identification: Michaela Wells MRN:  [id number] Principal Diagnosis: Hypokalemia Diagnosis:  Principal Problem:   Hypokalemia Active Problems:   COPD (chronic obstructive pulmonary disease) (HCC)   GAD (generalized anxiety disorder)   GERD (gastroesophageal reflux disease)   Bipolar I disorder, current or most recent episode depressed, with psychotic features (HCC)   PTSD (post-traumatic stress disorder)   MDD (major depressive disorder)   AKI (acute kidney injury) (HCC)   Dehydration   Transaminitis   Auditory hallucination   Visual hallucinations   Acute metabolic encephalopathy   Total Time spent with patient: 30 minutes  Subjective:   Michaela Wells is a 60 y.o. female appears to be much improved on todays assessment. SHe is able to show sound clinical judgement and improved insight. Her cognitive impairment has also improved. She is able to recall her history, and is oriented x 3. She denies any paranoia, psychosis, fear, anxiety or hallucinations at this time. SHe is initially observed having a linear conversation with her daughter, on the phone. She appears to be tearful when I advised her of her length of stay and her original presentation. She states she is hoping to discharge soon so that she can celebrate her grand daughters birthday. She tells me her sister was diagnosed with dementia at the age of 47. She reports having signs that are concerning for dementia and wants more testing done. She denied suicidal or homicidal ideations.   Chart reviewed: Adjustments made to current medications.  We will increase Zyprexa 2.5 mg  mg to 5 mg p.o. twice daily and decrease Geodon 20 mg p.o. twice daily to daily.  Continue to monitor EKG. CSW continue seeking inpatient admission.   HPI: Per admission assessment note:  Michaela Wells is a 60 y.o. female  with medical history significant of bipolar 1 disorder, PTSD, schizo affective disorder, COPD recently hospitalized 02/26/2021-02/27/2021 for hypokalemia/acute kidney injury and noted at that time to have hallucinations who presents back to the ED after a fall this morning. Patient also noted to be having worsening auditory and visual hallucinations stating that the mother sees a guy with horses outside and is also concerned that someone is going to poison her and her sister was trying to kill her.  It is also noted that patient feels her neighbors have placed a spell on her heart.  It is also noted that patient has had a 20 pound weight loss over the past month.  Past Psychiatric History:   Risk to Self:   Risk to Others:   Prior Inpatient Therapy:   Prior Outpatient Therapy:    Past Medical History:  Past Medical History:  Diagnosis Date   Bronchial asthma    Chicken pox    COPD (chronic obstructive pulmonary disease) (HCC)    Depression    Frequent headaches    GERD (gastroesophageal reflux disease)    UTI (lower urinary tract infection)     Past Surgical History:  Procedure Laterality Date   ESOPHAGEAL DILATION     TONSILLECTOMY     Family History:  Family History  Problem Relation Age of Onset   Other Mother        MVA-Deceased [pt was age 57]   COPD Father        Deceased   Heart attack Maternal Grandmother    Stroke Maternal Grandmother    Brain cancer Maternal Grandmother  Arthritis/Rheumatoid Maternal Grandmother    Heart attack Maternal Grandfather    Stroke Maternal Grandfather    Heart disease Maternal Aunt    Stroke Maternal Aunt        #1   Heart attack Maternal Aunt        #1   Hypertension Maternal Aunt        #1   Arthritis/Rheumatoid Sister    COPD Sister    Heart disease Maternal Uncle    Heart attack Maternal Uncle        x4   Heart disease Brother        #1   Heart attack Brother        #1   Diabetes Brother    Diabetes Maternal Aunt    Mental  retardation Maternal Aunt    Healthy Daughter        x2   Drug abuse Daughter        #1   Drug abuse Daughter        #2   Kidney Stones Daughter        #2   Other Son        Agoraphobia   Family Psychiatric  History:  Social History:  Social History   Substance and Sexual Activity  Alcohol Use Yes     Social History   Substance and Sexual Activity  Drug Use Not Currently    Social History   Socioeconomic History   Marital status: Single    Spouse name: Not on file   Number of children: Not on file   Years of education: Not on file   Highest education level: Not on file  Occupational History   Not on file  Tobacco Use   Smoking status: Every Day   Smokeless tobacco: Never  Substance and Sexual Activity   Alcohol use: Yes   Drug use: Not Currently   Sexual activity: Not Currently  Other Topics Concern   Not on file  Social History Narrative   Not on file   Social Determinants of Health   Financial Resource Strain: Not on file  Food Insecurity: Not on file  Transportation Needs: Not on file  Physical Activity: Not on file  Stress: Not on file  Social Connections: Not on file   Additional Social History:    Allergies:   Allergies  Allergen Reactions   Penicillins Other (See Comments)    Childhood allergy- Reaction?? Has patient had a PCN reaction causing immediate rash, facial/tongue/throat swelling, SOB or lightheadedness with hypotension: Unknown Has patient had a PCN reaction causing severe rash involving mucus membranes or skin necrosis: Unknown Has patient had a PCN reaction that required hospitalization: Unknown Has patient had a PCN reaction occurring within the last 10 years: Unknown If all of the above answers are "NO", then may proceed with Cephalosporin use.      Labs:  Results for orders placed or performed during the hospital encounter of 03/04/21 (from the past 48 hour(s))  Glucose, capillary     Status: Abnormal   Collection Time:  03/10/21  4:42 PM  Result Value Ref Range   Glucose-Capillary 127 (H) 70 - 99 mg/dL    Comment: Glucose reference range applies only to samples taken after fasting for at least 8 hours.  Glucose, capillary     Status: Abnormal   Collection Time: 03/10/21  8:16 PM  Result Value Ref Range   Glucose-Capillary 115 (H) 70 - 99 mg/dL    Comment:  Glucose reference range applies only to samples taken after fasting for at least 8 hours.  Glucose, capillary     Status: Abnormal   Collection Time: 03/11/21 12:02 AM  Result Value Ref Range   Glucose-Capillary 105 (H) 70 - 99 mg/dL    Comment: Glucose reference range applies only to samples taken after fasting for at least 8 hours.  Glucose, capillary     Status: None   Collection Time: 03/11/21  4:16 AM  Result Value Ref Range   Glucose-Capillary 88 70 - 99 mg/dL    Comment: Glucose reference range applies only to samples taken after fasting for at least 8 hours.  Glucose, capillary     Status: None   Collection Time: 03/11/21  7:17 AM  Result Value Ref Range   Glucose-Capillary 91 70 - 99 mg/dL    Comment: Glucose reference range applies only to samples taken after fasting for at least 8 hours.  CBC     Status: None   Collection Time: 03/11/21  7:18 AM  Result Value Ref Range   WBC 6.7 4.0 - 10.5 K/uL   RBC 4.26 3.87 - 5.11 MIL/uL   Hemoglobin 12.8 12.0 - 15.0 g/dL   HCT 76.7 20.9 - 47.0 %   MCV 93.0 80.0 - 100.0 fL   MCH 30.0 26.0 - 34.0 pg   MCHC 32.3 30.0 - 36.0 g/dL   RDW 96.2 83.6 - 62.9 %   Platelets 329 150 - 400 K/uL   nRBC 0.0 0.0 - 0.2 %    Comment: Performed at [facility] [address], [place], [place] [address]  Comprehensive metabolic panel     Status: Abnormal   Collection Time: 03/11/21  7:18 AM  Result Value Ref Range   Sodium 140 135 - 145 mmol/L   Potassium 4.1 3.5 - 5.1 mmol/L   Chloride 104 98 - 111 mmol/L   CO2 28 22 - 32 mmol/L   Glucose, Bld 90 70 - 99 mg/dL    Comment: Glucose  reference range applies only to samples taken after fasting for at least 8 hours.   BUN 13 6 - 20 mg/dL   Creatinine, Ser 6.50 (H) 0.44 - 1.00 mg/dL   Calcium 8.1 (L) 8.9 - 10.3 mg/dL   Total Protein 6.0 (L) 6.5 - 8.1 g/dL   Albumin 2.9 (L) 3.5 - 5.0 g/dL   AST 16 15 - 41 U/L   ALT 18 0 - 44 U/L   Alkaline Phosphatase 53 38 - 126 U/L   Total Bilirubin 0.5 0.3 - 1.2 mg/dL   GFR, Estimated 55 (L) >60 mL/min    Comment: (NOTE) Calculated using the CKD-EPI Creatinine Equation (2021)    Anion gap 8 5 - 15    Comment: Performed at [facility], [address], [place], [place] [address]  Glucose, capillary     Status: Abnormal   Collection Time: 03/11/21 12:07 PM  Result Value Ref Range   Glucose-Capillary 167 (H) 70 - 99 mg/dL    Comment: Glucose reference range applies only to samples taken after fasting for at least 8 hours.  SARS CORONAVIRUS 2 (TAT 6-24 HRS) Nasopharyngeal Nasopharyngeal Swab     Status: None   Collection Time: 03/11/21 12:52 PM   Specimen: Nasopharyngeal Swab  Result Value Ref Range   SARS Coronavirus 2 NEGATIVE NEGATIVE    Comment: (NOTE) SARS-CoV-2 target nucleic acids are NOT DETECTED.  The SARS-CoV-2 RNA is generally detectable in upper  and lower respiratory specimens during the acute phase of infection. Negative results do not preclude SARS-CoV-2 infection, do not rule out co-infections with other pathogens, and should not be used as the sole basis for treatment or other patient management decisions. Negative results must be combined with clinical observations, patient history, and epidemiological information. The expected result is Negative.  Fact Sheet for Patients: HairSlick.nohttps://www.fda.gov/media/138098/download  Fact Sheet for Healthcare Providers: quierodirigir.comhttps://www.fda.gov/media/138095/download  This test is not yet approved or cleared by the [place] FDA and  has been authorized for detection and/or diagnosis of SARS-CoV-2 by FDA  under an Emergency Use Authorization (EUA). This EUA will remain  in effect (meaning this test can be used) for the duration of the COVID-19 declaration under Se ction 564(b)(1) of the Act, 21 U.S.C. section 360bbb-3(b)(1), unless the authorization is terminated or revoked sooner.  Performed at [facility], [address] [place], [place] [address]   Ethanol     Status: None   Collection Time: 03/11/21  2:45 PM  Result Value Ref Range   Alcohol, Ethyl (B) <10 <10 mg/dL    Comment: (NOTE) Lowest detectable limit for serum alcohol is 10 mg/dL.  For medical purposes only. Performed at [facility], [address] [place], [place] [address]   Acetaminophen level     Status: Abnormal   Collection Time: 03/11/21  2:45 PM  Result Value Ref Range   Acetaminophen (Tylenol), Serum <10 (L) 10 - 30 ug/mL    Comment: (NOTE) Therapeutic concentrations vary significantly. A range of 10-30 ug/mL  may be an effective concentration for many patients. However, some  are best treated at concentrations outside of this range. Acetaminophen concentrations >150 ug/mL at 4 hours after ingestion  and >50 ug/mL at 12 hours after ingestion are often associated with  toxic reactions.  Performed at [facility], [address] [place], [place] [address]   Glucose, capillary     Status: Abnormal   Collection Time: 03/11/21  4:42 PM  Result Value Ref Range   Glucose-Capillary 102 (H) 70 - 99 mg/dL    Comment: Glucose reference range applies only to samples taken after fasting for at least 8 hours.  Glucose, capillary     Status: Abnormal   Collection Time: 03/11/21  8:30 PM  Result Value Ref Range   Glucose-Capillary 124 (H) 70 - 99 mg/dL    Comment: Glucose reference range applies only to samples taken after fasting for at least 8 hours.  Pregnancy, urine     Status: None   Collection Time: 03/11/21  8:42 PM  Result Value Ref Range   Preg Test, Ur  NEGATIVE NEGATIVE    Comment:        THE SENSITIVITY OF THIS METHODOLOGY IS >20 mIU/mL. Performed at [facility], [address] [place], [place] [address]   Glucose, capillary     Status: Abnormal   Collection Time: 03/12/21 12:13 AM  Result Value Ref Range   Glucose-Capillary 104 (H) 70 - 99 mg/dL    Comment: Glucose reference range applies only to samples taken after fasting for at least 8 hours.  Glucose, capillary     Status: None   Collection Time: 03/12/21  4:16 AM  Result Value Ref Range   Glucose-Capillary 93 70 - 99 mg/dL    Comment: Glucose reference range applies only to samples taken after fasting for at least 8 hours.  Glucose, capillary     Status: None  Collection Time: 03/12/21  7:49 AM  Result Value Ref Range   Glucose-Capillary 82 70 - 99 mg/dL    Comment: Glucose reference range applies only to samples taken after fasting for at least 8 hours.  Glucose, capillary     Status: Abnormal   Collection Time: 03/12/21 11:50 AM  Result Value Ref Range   Glucose-Capillary 105 (H) 70 - 99 mg/dL    Comment: Glucose reference range applies only to samples taken after fasting for at least 8 hours.    Current Facility-Administered Medications  Medication Dose Route Frequency Provider Last Rate Last Admin   acetaminophen (TYLENOL) 160 MG/5ML solution 650 mg  650 mg Oral Q6H PRN Alwyn Ren, MD   650 mg at 03/07/21 2046   Or   acetaminophen (TYLENOL) suppository 650 mg  650 mg Rectal Q6H PRN Alwyn Ren, MD       albuterol (PROVENTIL) (2.5 MG/3ML) 0.083% nebulizer solution 3 mL  3 mL Inhalation Q4H PRN Rodolph Bong, MD   3 mL at 03/09/21 2325   benztropine (COGENTIN) tablet 1 mg  1 mg Oral BID Rodolph Bong, MD   1 mg at 03/12/21 1038   bisacodyl (DULCOLAX) EC tablet 10 mg  10 mg Oral Daily Alwyn Ren, MD   10 mg at 03/12/21 1038   clonazePAM (KLONOPIN) tablet 0.5 mg  0.5 mg Oral BID Rodolph Bong, MD   0.5 mg  at 03/12/21 1037   enoxaparin (LOVENOX) injection 40 mg  40 mg Subcutaneous Q24H Rodolph Bong, MD   40 mg at 03/11/21 2153   feeding supplement (ENSURE ENLIVE / ENSURE PLUS) liquid 237 mL  237 mL Oral BID BM Rodolph Bong, MD   237 mL at 03/12/21 1042   fluticasone (FLONASE) 50 MCG/ACT nasal spray 2 spray  2 spray Each Nare Daily Rodolph Bong, MD   2 spray at 03/12/21 1045   folic acid (FOLVITE) tablet 1 mg  1 mg Oral Daily Rodolph Bong, MD   1 mg at 03/12/21 1038   hydrALAZINE (APRESOLINE) injection 5 mg  5 mg Intravenous Q6H PRN Rodolph Bong, MD   5 mg at 03/07/21 2005   lactulose (CHRONULAC) 10 GM/15ML solution 30 g  30 g Oral Once Alwyn Ren, MD       metoprolol tartrate (LOPRESSOR) tablet 12.5 mg  12.5 mg Oral BID Alwyn Ren, MD   12.5 mg at 03/12/21 1038   multivitamin with minerals tablet 1 tablet  1 tablet Oral Daily Rodolph Bong, MD   1 tablet at 03/12/21 1038   nicotine (NICODERM CQ - dosed in mg/24 hours) patch 21 mg  21 mg Transdermal Daily Rodolph Bong, MD   21 mg at 03/12/21 1044   OLANZapine zydis (ZYPREXA) disintegrating tablet 5 mg  5 mg Oral BID Oneta Rack, NP   5 mg at 03/12/21 1037   ondansetron (ZOFRAN) injection 4 mg  4 mg Intravenous Q6H PRN Rodolph Bong, MD   4 mg at 03/07/21 1710   ondansetron (ZOFRAN-ODT) disintegrating tablet 4 mg  4 mg Oral Q8H PRN Alwyn Ren, MD   4 mg at 03/11/21 1327   pantoprazole (PROTONIX) EC tablet 40 mg  40 mg Oral Daily Rexford Maus, RPH   40 mg at 03/12/21 1038   polyethylene glycol (MIRALAX / GLYCOLAX) packet 17 g  17 g Oral Daily Alwyn Ren, MD   17 g at 03/12/21  1037   thiamine tablet 100 mg  100 mg Oral Daily Rodolph Bong, MD   100 mg at 03/12/21 1038   umeclidinium-vilanterol (ANORO ELLIPTA) 62.5-25 MCG/INH 1 puff  1 puff Inhalation Daily Rodolph Bong, MD   1 puff at 03/12/21 0725    Musculoskeletal   Psychiatric Specialty  Exam:  Presentation  General Appearance: Appropriate for Environment; Fairly Groomed; Disheveled (hair disarray and missing teeth)  Eye Contact:Fair  Speech:Clear and Coherent; Normal Rate  Speech Volume:Normal  Handedness:Right   Mood and Affect  Mood:Euthymic  Affect:Appropriate; Congruent   Thought Process  Thought Processes:Coherent; Linear  Descriptions of Associations:Circumstantial  Orientation:Full (Time, Place and Person)  Thought Content:Tangential  History of Schizophrenia/Schizoaffective disorder:Yes  Duration of Psychotic Symptoms:Greater than six months  Hallucinations:Hallucinations: None Ideas of Reference:None  Suicidal Thoughts:Suicidal Thoughts: No Homicidal Thoughts:Homicidal Thoughts: No  Sensorium  Memory:Immediate Good; Recent Fair; Remote Fair  Judgment:Fair  Insight:Fair   Executive Functions  Concentration:Good  Attention Span:Good  Recall:Fair  Fund of Knowledge:Good  Language:Good   Psychomotor Activity  Psychomotor Activity: Psychomotor Activity: Normal  Assets  Assets:Communication Skills; Desire for Improvement; Leisure Time; Physical Health   Sleep  Sleep: Sleep: Fair  Physical Exam: Physical Exam Vitals and nursing note reviewed.  Constitutional:      Appearance: Normal appearance.  Neurological:     Mental Status: She is alert.  Psychiatric:        Attention and Perception: She perceives auditory and visual hallucinations.        Mood and Affect: Mood is depressed.        Speech: Speech normal.        Behavior: Behavior is agitated.        Thought Content: Thought content is paranoid and delusional.        Cognition and Memory: Memory is impaired.   ROS Blood pressure 108/75, pulse 70, temperature 98.4 F (36.9 C), temperature source Oral, resp. rate 18, height  (1.575 m), weight 76.4 kg, SpO2 99 %. Body mass index is 30.81 kg/m.   Disposition: Recommend psychiatric Inpatient admission  when medically cleared.  Schizoaffective: Patient is showing much improvement. If she continues to improve with medication adjustments will likely be able to discharge in the upcoming days. Psychiatry to reassess tomorrow. See below for adjustments.   Adjustments made to current medications.   We will continue  Zyprexa  po BID. Will d/c Geodon at this time.  Valproic Acid Level was 56 on 7/14 -Continue to monitor EKG.  -CSW continue seeking inpatient admission.     Maryagnes Amos, FNP 03/12/2021 2:12 PM

## 2021-03-12 NOTE — Progress Notes (Signed)
PROGRESS NOTE    Michaela PaneRhonda L Wells  WUJ:[id number] DOB: 1961/03/04 DOA: 03/04/2021 PCP: Rema FendtStephens, Amy J, NP   Brief Narrative: 60 year old female history of bipolar 1 disorder, PTSD, schizoaffective disorder, COPD recently hospitalized (7/4-7/5) for hypokalemia and acute kidney injury during the hospitalization noted to have hallucinations who presents back to the ED after a fall.  It is noted that patient is having both auditory and visual hallucinations which have worsened recently per daughter with some associated decreased oral intake and a 20 pound weight loss over the past month.  Infectious work-up done negative.  Comprehensive metabolic profile noted patient to be hypokalemic in acute kidney injury.  SARS coronavirus 2 PCR done negative.  Patient admitted for electrolyte abnormalities, acute kidney injury, auditory and visual hallucinations.  Psychiatry consulted.  Assessment & Plan:   Principal Problem:   Hypokalemia Active Problems:   COPD (chronic obstructive pulmonary disease) (HCC)   GAD (generalized anxiety disorder)   GERD (gastroesophageal reflux disease)   Bipolar I disorder, current or most recent episode depressed, with psychotic features (HCC)   PTSD (post-traumatic stress disorder)   MDD (major depressive disorder)   AKI (acute kidney injury) (HCC)   Dehydration   Transaminitis   Auditory hallucination   Visual hallucinations   Acute metabolic encephalopathy   #1 acute metabolic encephalopathy in the setting of bipolar disorder-she is admitted with auditory and visual hallucination.  Work-up so far includes infectious work-up which was negative.  CT head showed no acute abnormalities. Her Depakote level was high so that was discontinued.  Depakote level was 1 32 down to 56 today. BuSpar and hydroxyzine is on hold. Psych planning to increase the dose of Zyprexa on 03/12/2021 watch her overnight if she is remained stable plan discharge home 03/13/2021. Klonopin  continued. Geodon TO DC today 7/18. B12  TSH folate rpr normal  MRI brain -no acute findings.  Discussed with neurology on-call   #2 AKI likely prerenal secondary to decreased p.o. intake in the setting of oral diuretics.  Patient lost 20 pounds in the past month due to decreased p.o. intake.  Renal functions improved with hydration.  #3 severe hypokalemia resolved -which was thought to be due to HCTZ which has been stopped.  HCTZ should not be resumed on discharge.  #4 transaminitis likely secondary to dehydration.  Abdominal ultrasound shows sludge in the gallbladder without any acute cholecystitis.  LFTs trending down.  Hepatitis panel negative.  #5 odynophagia on dysphagia 3 diet.  Barium swallow unremarkable.she has no c/o today.  #6 abdominal pain -resolved.ct negative.  #7 hypertension patient was on HCTZ at home which has been stopped due to hypokalemia.  We will start the patient on Lopressor 12.5 twice daily.  Nutrition Problem: Inadequate oral intake Etiology:  (AMS)   Signs/Symptoms: per patient/family report  Interventions: Ensure Enlive (each supplement provides 350kcal and 20 grams of protein), MVI, Magic cup  Estimated body mass index is 30.81 kg/m as calculated from the following:   Height as of this encounter: 5\' 2"  (1.575 m).   Weight as of this encounter: 76.4 kg.  DVT prophylaxis: Lovenox  code Status: Full code  family Communication: dw daughter Michaela Wells disposition Plan:  Status is: Inpatient  Remains inpatient appropriate because:Inpatient level of care appropriate due to severity of illness  Dispo: The patient is from: Home              Anticipated d/c is to: Home  Patient currently is MEDICALLY STABLE/CLEARED FOR DC   Difficult to place patient Yes    Consultants: Psychiatry  Procedures: None Antimicrobials: None  Subjective:  She is sitting up by the side of the bed awake and alert No new c/o  Objective: Vitals:    03/11/21 2153 03/12/21 0415 03/12/21 0500 03/12/21 1248  BP: 108/73 99/66  108/75  Pulse: 72 (!) 58  70  Resp:  20  18  Temp:  97.8 F (36.6 C)  98.4 F (36.9 C)  TempSrc:  Oral  Oral  SpO2:  97%  99%  Weight:   76.4 kg   Height:        Intake/Output Summary (Last 24 hours) at 03/12/2021 1250 Last data filed at 03/12/2021 0930 Gross per 24 hour  Intake 480 ml  Output 550 ml  Net -70 ml    Filed Weights   03/10/21 0500 03/11/21 0500 03/12/21 0500  Weight: 76.4 kg 76 kg 76.4 kg    Examination: Patient is resting in bed  Respiratory system: Clear to auscultation. Respiratory effort normal. Cardiovascular system: S1 & S2 heard, RRR. No JVD, murmurs, rubs, gallops or clicks. No pedal edema. Gastrointestinal system: Abdomen is nondistended, soft and tender. No organomegaly or masses felt. Normal bowel sounds heard. Central nervous system: Alert and oriented. No focal neurological deficits. Extremities: Symmetric 5 x 5 power. Skin: No rashes, lesions or ulcers Psychiatry: unable  to assess  Data Reviewed: I have personally reviewed following labs and imaging studies  CBC: Recent Labs  Lab 03/06/21 0501 03/07/21 0508 03/09/21 1249 03/11/21 0718  WBC 4.0 6.0 6.1 6.7  HGB 12.7 14.5 13.4 12.8  HCT 39.5 44.2 41.4 39.6  MCV 92.9 91.5 92.0 93.0  PLT 254 304 305 329    Basic Metabolic Panel: Recent Labs  Lab 03/06/21 0501 03/07/21 0508 03/09/21 1249 03/11/21 0718  NA 143 139 140 140  K 3.5 4.3 3.7 4.1  CL 110 103 102 104  CO2 26 24 30 28   GLUCOSE 93 106* 101* 90  BUN 7 <5* 12 13  CREATININE 1.00 1.09* 1.12* 1.15*  CALCIUM 7.8* 8.5* 8.1* 8.1*  MG 2.2 2.0  --   --   PHOS 2.3* 3.2  --   --     GFR: Estimated Creatinine Clearance: 49.8 mL/min (A) (by C-G formula based on SCr of 1.15 mg/dL (H)). Liver Function Tests: Recent Labs  Lab 03/06/21 0501 03/07/21 0508 03/09/21 1249 03/11/21 0718  AST 59* 52* 21 16  ALT 36 38 21 18  ALKPHOS 59 64 57 53  BILITOT  0.3 0.9 0.5 0.5  PROT 6.0* 6.7 6.2* 6.0*  ALBUMIN 3.0* 3.4* 2.9* 2.9*    No results for input(s): LIPASE, AMYLASE in the last 168 hours. No results for input(s): AMMONIA in the last 168 hours.  Coagulation Profile: No results for input(s): INR, PROTIME in the last 168 hours. Cardiac Enzymes: No results for input(s): CKTOTAL, CKMB, CKMBINDEX, TROPONINI in the last 168 hours. BNP (last 3 results) No results for input(s): PROBNP in the last 8760 hours. HbA1C: No results for input(s): HGBA1C in the last 72 hours. CBG: Recent Labs  Lab 03/11/21 2030 03/12/21 0013 03/12/21 0416 03/12/21 0749 03/12/21 1150  GLUCAP 124* 104* 93 82 105*    Lipid Profile: No results for input(s): CHOL, HDL, LDLCALC, TRIG, CHOLHDL, LDLDIRECT in the last 72 hours. Thyroid Function Tests: No results for input(s): TSH, T4TOTAL, FREET4, T3FREE, THYROIDAB in the last 72 hours. Anemia Panel: No  results for input(s): VITAMINB12, FOLATE, FERRITIN, TIBC, IRON, RETICCTPCT in the last 72 hours.  Sepsis Labs: No results for input(s): PROCALCITON, LATICACIDVEN in the last 168 hours.  Recent Results (from the past 240 hour(s))  Resp Panel by RT-PCR (Flu A&B, Covid) Nasopharyngeal Swab     Status: None   Collection Time: 03/04/21  2:28 PM   Specimen: Nasopharyngeal Swab; Nasopharyngeal(NP) swabs in vial transport medium  Result Value Ref Range Status   SARS Coronavirus 2 by RT PCR NEGATIVE NEGATIVE Final    Comment: (NOTE) SARS-CoV-2 target nucleic acids are NOT DETECTED.  The SARS-CoV-2 RNA is generally detectable in upper respiratory specimens during the acute phase of infection. The lowest concentration of SARS-CoV-2 viral copies this assay can detect is 138 copies/mL. A negative result does not preclude SARS-Cov-2 infection and should not be used as the sole basis for treatment or other patient management decisions. A negative result may occur with  improper specimen collection/handling, submission of  specimen other than nasopharyngeal swab, presence of viral mutation(s) within the areas targeted by this assay, and inadequate number of viral copies(<138 copies/mL). A negative result must be combined with clinical observations, patient history, and epidemiological information. The expected result is Negative.  Fact Sheet for Patients:  BloggerCourse.com  Fact Sheet for Healthcare Providers:  SeriousBroker.it  This test is no t yet approved or cleared by the [place] FDA and  has been authorized for detection and/or diagnosis of SARS-CoV-2 by FDA under an Emergency Use Authorization (EUA). This EUA will remain  in effect (meaning this test can be used) for the duration of the COVID-19 declaration under Section 564(b)(1) of the Act, 21 U.S.C.section 360bbb-3(b)(1), unless the authorization is terminated  or revoked sooner.       Influenza A by PCR NEGATIVE NEGATIVE Final   Influenza B by PCR NEGATIVE NEGATIVE Final    Comment: (NOTE) The Xpert Xpress SARS-CoV-2/FLU/RSV plus assay is intended as an aid in the diagnosis of influenza from Nasopharyngeal swab specimens and should not be used as a sole basis for treatment. Nasal washings and aspirates are unacceptable for Xpert Xpress SARS-CoV-2/FLU/RSV testing.  Fact Sheet for Patients: BloggerCourse.com  Fact Sheet for Healthcare Providers: SeriousBroker.it  This test is not yet approved or cleared by the [place] FDA and has been authorized for detection and/or diagnosis of SARS-CoV-2 by FDA under an Emergency Use Authorization (EUA). This EUA will remain in effect (meaning this test can be used) for the duration of the COVID-19 declaration under Section 564(b)(1) of the Act, 21 U.S.C. section 360bbb-3(b)(1), unless the authorization is terminated or revoked.  Performed at [facility], [address] [place], [place] [address]   Culture, Urine     Status: None   Collection Time: 03/05/21  4:14 AM   Specimen: Urine, Clean Catch  Result Value Ref Range Status   Specimen Description   Final    URINE, CLEAN CATCH Performed at [facility], [address], [place], [place] [address]    Special Requests   Final    NONE Performed at [facility], [address], [place], [place] [address]    Culture   Final    NO GROWTH Performed at [facility], [address] [place], [place] [address]    Report Status 03/06/2021 FINAL  Final  SARS CORONAVIRUS 2 (TAT 6-24 HRS) Nasopharyngeal Nasopharyngeal Swab     Status: None   Collection Time: 03/11/21 12:52 PM  Specimen: Nasopharyngeal Swab  Result Value Ref Range Status   SARS Coronavirus 2 NEGATIVE NEGATIVE Final    Comment: (NOTE) SARS-CoV-2 target nucleic acids are NOT DETECTED.  The SARS-CoV-2 RNA is generally detectable in upper and lower respiratory specimens during the acute phase of infection. Negative results do not preclude SARS-CoV-2 infection, do not rule out co-infections with other pathogens, and should not be used as the sole basis for treatment or other patient management decisions. Negative results must be combined with clinical observations, patient history, and epidemiological information. The expected result is Negative.  Fact Sheet for Patients: HairSlick.no  Fact Sheet for Healthcare Providers: quierodirigir.com  This test is not yet approved or cleared by the [place] FDA and  has been authorized for detection and/or diagnosis of SARS-CoV-2 by FDA under an Emergency Use Authorization (EUA). This EUA will remain  in effect (meaning this test can be used) for the duration of the COVID-19 declaration under Se ction 564(b)(1) of the Act, 21 U.S.C. section 360bbb-3(b)(1), unless the authorization is  terminated or revoked sooner.  Performed at [facility], [address] [place], [place] [address]           Radiology Studies: No results found.      Scheduled Meds:  benztropine  1 mg Oral BID   bisacodyl  10 mg Oral Daily   clonazePAM  0.5 mg Oral BID   enoxaparin (LOVENOX) injection  40 mg Subcutaneous Q24H   feeding supplement  237 mL Oral BID BM   fluticasone  2 spray Each Nare Daily   folic acid  1 mg Oral Daily   lactulose  30 g Oral Once   metoprolol tartrate  12.5 mg Oral BID   multivitamin with minerals  1 tablet Oral Daily   nicotine  21 mg Transdermal Daily   OLANZapine zydis  5 mg Oral BID   pantoprazole  40 mg Oral Daily   polyethylene glycol  17 g Oral Daily   thiamine  100 mg Oral Daily   umeclidinium-vilanterol  1 puff Inhalation Daily   ziprasidone  20 mg Oral Daily   Continuous Infusions:   LOS: 8 days    Time spent: 40 minutes  Alwyn Ren, MD 03/12/2021, 12:50 PM

## 2021-03-12 NOTE — Progress Notes (Signed)
Physical Therapy Treatment Patient Details Name: Michaela Wells MRN: [id number] DOB: [birth year] Today's Date: 03/12/2021    History of Present Illness patient is a 60 year old female who presented to the emergency room after a fall at home down stairs. patient was found to have severe hypokalemia, acute kidney injury,acute metabolic encephalopathy, and transaminitis. patient has been noted to have multiple hallucinations during admission. [id number] 1 disorder, PTSD, schizoaffective disorder, COPD recently hospitalized (7/4-7/5) for hypokalemia and acute kidney injury.    PT Comments    Pt progressing well. PT goals met. See below for details.D/c PT   Follow Up Recommendations  No PT follow up     Equipment Recommendations  None recommended by PT    Recommendations for Other Services       Precautions / Restrictions Precautions Precautions: None    Mobility  Bed Mobility Overal bed mobility: Independent                  Transfers Overall transfer level: Modified independent Equipment used: None                Ambulation/Gait Ambulation/Gait assistance: Supervision;Modified independent (Device/Increase time) Gait Distance (Feet): 280 Feet Assistive device: None Gait Pattern/deviations: Step-through pattern     General Gait Details: no LOB with hallway amb, not notable drifting today. slightly decr stride length, corrects with cues or incr velocity   Stairs             Wheelchair Mobility    Modified Rankin (Stroke Patients Only)       Balance                 Single Leg Stance - Right Leg: 9 Single Leg Stance - Left Leg: 5 Tandem Stance - Right Leg: 5 Tandem Stance - Left Leg: 5 Rhomberg - Eyes Opened: 60 Rhomberg - Eyes Closed: 30 High level balance activites: Side stepping;Backward walking;Direction changes;Head turns;Turns High Level Balance Comments: no LOB with above            Cognition Arousal/Alertness:  Awake/alert Behavior During Therapy: WFL for tasks assessed/performed Overall Cognitive Status: Within Functional Limits for tasks assessed                                        Exercises      General Comments        Pertinent Vitals/Pain Pain Assessment: No/denies pain    Home Living                      Prior Function            PT Goals (current goals can now be found in the care plan section) Acute Rehab PT Goals Patient Stated Goal: wants to go home PT Goal Formulation: All assessment and education complete, DC therapy Progress towards PT goals: Goals met/education completed, patient discharged from PT    Frequency           PT Plan Current plan remains appropriate    Co-evaluation              AM-PAC PT "6 Clicks" Mobility   Outcome Measure  Help needed turning from your back to your side while in a flat bed without using bedrails?: None Help needed moving from lying on your back to sitting on the side of a flat bed without using bedrails?:  None Help needed moving to and from a bed to a chair (including a wheelchair)?: None Help needed standing up from a chair using your arms (e.g., wheelchair or bedside chair)?: None Help needed to walk in hospital room?: None Help needed climbing 3-5 steps with a railing? : A Little 6 Click Score: 23    End of Session Equipment Utilized During Treatment: Gait belt Activity Tolerance: Patient tolerated treatment well Patient left: in bed;with call bell/phone within reach (alarm not set on arrival)   PT Visit Diagnosis: Unsteadiness on feet (R26.81);History of falling (Z91.81)     Time: 0510-7125 PT Time Calculation (min) (ACUTE ONLY): 18 min  Charges:  $Gait Training: 8-22 mins                     Baxter Flattery, PT  Acute Rehab Dept ([place]) [phone] Pager [phone]  03/12/2021    [facility] 03/12/2021, 4:03 PM

## 2021-03-13 LAB — GLUCOSE, CAPILLARY
Glucose-Capillary: 103 mg/dL — ABNORMAL HIGH (ref 70–99)
Glucose-Capillary: 114 mg/dL — ABNORMAL HIGH (ref 70–99)
Glucose-Capillary: 85 mg/dL (ref 70–99)
Glucose-Capillary: 85 mg/dL (ref 70–99)

## 2021-03-13 MED ORDER — METOPROLOL TARTRATE 25 MG PO TABS
12.5000 mg | ORAL_TABLET | Freq: Every day | ORAL | 2 refills | Status: DC
Start: 2021-03-13 — End: 2021-05-25

## 2021-03-13 MED ORDER — OLANZAPINE 5 MG PO TBDP: 5.0000 mg | ORAL_TABLET | Freq: Two times a day (BID) | ORAL | 2 refills | Status: DC

## 2021-03-13 NOTE — Consult Note (Signed)
[facility] Face-to-Face Psychiatry Consult   Reason for Consult: Schizoaffective, hallucinations  Referring Physician:  MD Jerolyn Center Patient Identification: Michaela Wells MRN:  [id number] Principal Diagnosis: Hypokalemia Diagnosis:  Principal Problem:   Hypokalemia Active Problems:   COPD (chronic obstructive pulmonary disease) (HCC)   GAD (generalized anxiety disorder)   GERD (gastroesophageal reflux disease)   Bipolar I disorder, current or most recent episode depressed, with psychotic features (HCC)   PTSD (post-traumatic stress disorder)   MDD (major depressive disorder)   AKI (acute kidney injury) (HCC)   Dehydration   Transaminitis   Auditory hallucination   Visual hallucinations   Acute metabolic encephalopathy   Total Time spent with patient: 30 minutes  Subjective:   Michaela Wells is a 60 y.o. female continues to show much improvement on today's evaluation.  She continues to deny any paranoia, psychosis, fear, anxiety, hallucinations at this time.  She remains alert and oriented x3, is able to have a linear conversation.  She appears to be much engage and shows appropriate clinical judgment and improved insight.  She denies any current physical symptoms, and or adverse reactions as a result of her medication titration during this hospitalization.  She reports compliance with her olanzapine 5 mg p.o. twice daily, that was initiated during this hospitalization.  We have also reviewed her MRI results, and she will need to follow-up with neurology at discharge.  She denied suicidal or homicidal ideations.   Chart reviewed: Adjustments made to current medications.  We will increase Zyprexa 2.5 mg  mg to 5 mg p.o. twice daily and decrease Geodon 20 mg p.o. twice daily to daily.  Continue to monitor EKG. CSW continue seeking inpatient admission.   HPI: Per admission assessment note:  Michaela Wells is a 60 y.o. female with medical history significant of bipolar 1 disorder, PTSD, schizo  affective disorder, COPD recently hospitalized 02/26/2021-02/27/2021 for hypokalemia/acute kidney injury and noted at that time to have hallucinations who presents back to the ED after a fall this morning. Patient also noted to be having worsening auditory and visual hallucinations stating that the mother sees a guy with horses outside and is also concerned that someone is going to poison her and her sister was trying to kill her.  It is also noted that patient feels her neighbors have placed a spell on her heart.  It is also noted that patient has had a 20 pound weight loss over the past month.   Schizoaffective disorder, bipolar type, PTSD.  Currently receiving outpatient psychiatric services from [facility] behavioral health outpatient [facility], [facility] nurse practitioner.  Previous medication she was treated with include ziprasidone, quetiapine, lorazepam, Klonopin and BuSpar.  History shows 1 previous suicide attempt by self laceration over 30 years ago.   Risk to Self: Yes due to cognitive impairment Risk to Others: No Prior Inpatient Therapy: Yes over 30 years ago in 1985 Prior Outpatient Therapy: See above  Past Medical History:  Past Medical History:  Diagnosis Date   Bronchial asthma    Chicken pox    COPD (chronic obstructive pulmonary disease) (HCC)    Depression    Frequent headaches    GERD (gastroesophageal reflux disease)    UTI (lower urinary tract infection)     Past Surgical History:  Procedure Laterality Date   ESOPHAGEAL DILATION     TONSILLECTOMY     Family History:  Family History  Problem Relation Age of Onset   Other Mother  MVA-Deceased [pt was age 80]   COPD Father        Deceased   Heart attack Maternal Grandmother    Stroke Maternal Grandmother    Brain cancer Maternal Grandmother    Arthritis/Rheumatoid Maternal Grandmother    Heart attack Maternal Grandfather    Stroke Maternal Grandfather    Heart disease Maternal Aunt    Stroke Maternal Aunt         #1   Heart attack Maternal Aunt        #1   Hypertension Maternal Aunt        #1   Arthritis/Rheumatoid Sister    COPD Sister    Heart disease Maternal Uncle    Heart attack Maternal Uncle        x4   Heart disease Brother        #1   Heart attack Brother        #1   Diabetes Brother    Diabetes Maternal Aunt    Mental retardation Maternal Aunt    Healthy Daughter        x2   Drug abuse Daughter        #1   Drug abuse Daughter        #2   Kidney Stones Daughter        #2   Other Son        Agoraphobia   Family Psychiatric  History: As per daughter Lowella Bandy her sister has a diagnosis of dementia.  Social History:  Social History   Substance and Sexual Activity  Alcohol Use Yes     Social History   Substance and Sexual Activity  Drug Use Not Currently    Social History   Socioeconomic History   Marital status: Single    Spouse name: Not on file   Number of children: Not on file   Years of education: Not on file   Highest education level: Not on file  Occupational History   Not on file  Tobacco Use   Smoking status: Every Day   Smokeless tobacco: Never  Substance and Sexual Activity   Alcohol use: Yes   Drug use: Not Currently   Sexual activity: Not Currently  Other Topics Concern   Not on file  Social History Narrative   Not on file   Social Determinants of Health   Financial Resource Strain: Not on file  Food Insecurity: Not on file  Transportation Needs: Not on file  Physical Activity: Not on file  Stress: Not on file  Social Connections: Not on file   Additional Social History:    Allergies:   Allergies  Allergen Reactions   Penicillins Other (See Comments)    Childhood allergy- Reaction?? Has patient had a PCN reaction causing immediate rash, facial/tongue/throat swelling, SOB or lightheadedness with hypotension: Unknown Has patient had a PCN reaction causing severe rash involving mucus membranes or skin necrosis: Unknown Has  patient had a PCN reaction that required hospitalization: Unknown Has patient had a PCN reaction occurring within the last 10 years: Unknown If all of the above answers are "NO", then may proceed with Cephalosporin use.      Labs:  Results for orders placed or performed during the hospital encounter of 03/04/21 (from the past 48 hour(s))  Glucose, capillary     Status: Abnormal   Collection Time: 03/11/21 12:07 PM  Result Value Ref Range   Glucose-Capillary 167 (H) 70 - 99 mg/dL    Comment: Glucose  reference range applies only to samples taken after fasting for at least 8 hours.  SARS CORONAVIRUS 2 (TAT 6-24 HRS) Nasopharyngeal Nasopharyngeal Swab     Status: None   Collection Time: 03/11/21 12:52 PM   Specimen: Nasopharyngeal Swab  Result Value Ref Range   SARS Coronavirus 2 NEGATIVE NEGATIVE    Comment: (NOTE) SARS-CoV-2 target nucleic acids are NOT DETECTED.  The SARS-CoV-2 RNA is generally detectable in upper and lower respiratory specimens during the acute phase of infection. Negative results do not preclude SARS-CoV-2 infection, do not rule out co-infections with other pathogens, and should not be used as the sole basis for treatment or other patient management decisions. Negative results must be combined with clinical observations, patient history, and epidemiological information. The expected result is Negative.  Fact Sheet for Patients: HairSlick.no  Fact Sheet for Healthcare Providers: quierodirigir.com  This test is not yet approved or cleared by the [place] FDA and  has been authorized for detection and/or diagnosis of SARS-CoV-2 by FDA under an Emergency Use Authorization (EUA). This EUA will remain  in effect (meaning this test can be used) for the duration of the COVID-19 declaration under Se ction 564(b)(1) of the Act, 21 U.S.C. section 360bbb-3(b)(1), unless the authorization is terminated or revoked  sooner.  Performed at [facility], [address], [place], [place] [address]   Ethanol     Status: None   Collection Time: 03/11/21  2:45 PM  Result Value Ref Range   Alcohol, Ethyl (B) <10 <10 mg/dL    Comment: (NOTE) Lowest detectable limit for serum alcohol is 10 mg/dL.  For medical purposes only. Performed at [facility] [address], [place], [place] [address]   Acetaminophen level     Status: Abnormal   Collection Time: 03/11/21  2:45 PM  Result Value Ref Range   Acetaminophen (Tylenol), Serum <10 (L) 10 - 30 ug/mL    Comment: (NOTE) Therapeutic concentrations vary significantly. A range of 10-30 ug/mL  may be an effective concentration for many patients. However, some  are best treated at concentrations outside of this range. Acetaminophen concentrations >150 ug/mL at 4 hours after ingestion  and >50 ug/mL at 12 hours after ingestion are often associated with  toxic reactions.  Performed at [facility], [address] [place], [place] [address]   Glucose, capillary     Status: Abnormal   Collection Time: 03/11/21  4:42 PM  Result Value Ref Range   Glucose-Capillary 102 (H) 70 - 99 mg/dL    Comment: Glucose reference range applies only to samples taken after fasting for at least 8 hours.  Glucose, capillary     Status: Abnormal   Collection Time: 03/11/21  8:30 PM  Result Value Ref Range   Glucose-Capillary 124 (H) 70 - 99 mg/dL    Comment: Glucose reference range applies only to samples taken after fasting for at least 8 hours.  Pregnancy, urine     Status: None   Collection Time: 03/11/21  8:42 PM  Result Value Ref Range   Preg Test, Ur NEGATIVE NEGATIVE    Comment:        THE SENSITIVITY OF THIS METHODOLOGY IS >20 mIU/mL. Performed at [facility], [address] [place], [place] [address]   Glucose, capillary     Status: Abnormal   Collection Time: 03/12/21 12:13 AM  Result Value Ref  Range   Glucose-Capillary 104 (H) 70 - 99 mg/dL  Comment: Glucose reference range applies only to samples taken after fasting for at least 8 hours.  Glucose, capillary     Status: None   Collection Time: 03/12/21  4:16 AM  Result Value Ref Range   Glucose-Capillary 93 70 - 99 mg/dL    Comment: Glucose reference range applies only to samples taken after fasting for at least 8 hours.  Glucose, capillary     Status: None   Collection Time: 03/12/21  7:49 AM  Result Value Ref Range   Glucose-Capillary 82 70 - 99 mg/dL    Comment: Glucose reference range applies only to samples taken after fasting for at least 8 hours.  Glucose, capillary     Status: Abnormal   Collection Time: 03/12/21 11:50 AM  Result Value Ref Range   Glucose-Capillary 105 (H) 70 - 99 mg/dL    Comment: Glucose reference range applies only to samples taken after fasting for at least 8 hours.  Glucose, capillary     Status: Abnormal   Collection Time: 03/12/21  3:54 PM  Result Value Ref Range   Glucose-Capillary 106 (H) 70 - 99 mg/dL    Comment: Glucose reference range applies only to samples taken after fasting for at least 8 hours.  Glucose, capillary     Status: Abnormal   Collection Time: 03/12/21  8:31 PM  Result Value Ref Range   Glucose-Capillary 120 (H) 70 - 99 mg/dL    Comment: Glucose reference range applies only to samples taken after fasting for at least 8 hours.  Glucose, capillary     Status: Abnormal   Collection Time: 03/13/21 12:38 AM  Result Value Ref Range   Glucose-Capillary 114 (H) 70 - 99 mg/dL    Comment: Glucose reference range applies only to samples taken after fasting for at least 8 hours.   Comment 1 Notify RN    Comment 2 Document in Chart   Glucose, capillary     Status: None   Collection Time: 03/13/21  4:38 AM  Result Value Ref Range   Glucose-Capillary 85 70 - 99 mg/dL    Comment: Glucose reference range applies only to samples taken after fasting for at least 8 hours.   Comment  1 Notify RN    Comment 2 Document in Chart   Glucose, capillary     Status: None   Collection Time: 03/13/21  7:34 AM  Result Value Ref Range   Glucose-Capillary 85 70 - 99 mg/dL    Comment: Glucose reference range applies only to samples taken after fasting for at least 8 hours.    Current Facility-Administered Medications  Medication Dose Route Frequency Provider Last Rate Last Admin   acetaminophen (TYLENOL) 160 MG/5ML solution 650 mg  650 mg Oral Q6H PRN Alwyn Ren, MD   650 mg at 03/07/21 2046   Or   acetaminophen (TYLENOL) suppository 650 mg  650 mg Rectal Q6H PRN Alwyn Ren, MD       albuterol (PROVENTIL) (2.5 MG/3ML) 0.083% nebulizer solution 3 mL  3 mL Inhalation Q4H PRN Rodolph Bong, MD   3 mL at 03/09/21 2325   benztropine (COGENTIN) tablet 1 mg  1 mg Oral BID Rodolph Bong, MD   1 mg at 03/12/21 2240   bisacodyl (DULCOLAX) EC tablet 10 mg  10 mg Oral Daily Alwyn Ren, MD   10 mg at 03/12/21 1038   clonazePAM (KLONOPIN) tablet 0.5 mg  0.5 mg Oral BID Rodolph Bong, MD  0.5 mg at 03/12/21 2240   enoxaparin (LOVENOX) injection 40 mg  40 mg Subcutaneous Q24H Rodolph Bonghompson, Daniel V, MD   40 mg at 03/12/21 2243   feeding supplement (ENSURE ENLIVE / ENSURE PLUS) liquid 237 mL  237 mL Oral BID BM Rodolph Bonghompson, Daniel V, MD   237 mL at 03/12/21 1547   fluticasone (FLONASE) 50 MCG/ACT nasal spray 2 spray  2 spray Each Nare Daily Rodolph Bonghompson, Daniel V, MD   2 spray at 03/12/21 1045   folic acid (FOLVITE) tablet 1 mg  1 mg Oral Daily Rodolph Bonghompson, Daniel V, MD   1 mg at 03/12/21 1038   hydrALAZINE (APRESOLINE) injection 5 mg  5 mg Intravenous Q6H PRN Rodolph Bonghompson, Daniel V, MD   5 mg at 03/07/21 2005   metoprolol tartrate (LOPRESSOR) tablet 12.5 mg  12.5 mg Oral BID Alwyn RenMathews, Elizabeth G, MD   12.5 mg at 03/12/21 2240   multivitamin with minerals tablet 1 tablet  1 tablet Oral Daily Rodolph Bonghompson, Daniel V, MD   1 tablet at 03/12/21 1038   nicotine (NICODERM CQ - dosed  in mg/24 hours) patch 21 mg  21 mg Transdermal Daily Rodolph Bonghompson, Daniel V, MD   21 mg at 03/12/21 1044   OLANZapine zydis (ZYPREXA) disintegrating tablet 5 mg  5 mg Oral BID Oneta RackLewis, Tanika N, NP   5 mg at 03/12/21 2240   ondansetron (ZOFRAN) injection 4 mg  4 mg Intravenous Q6H PRN Rodolph Bonghompson, Daniel V, MD   4 mg at 03/07/21 1710   ondansetron (ZOFRAN-ODT) disintegrating tablet 4 mg  4 mg Oral Q8H PRN Alwyn RenMathews, Elizabeth G, MD   4 mg at 03/11/21 1327   pantoprazole (PROTONIX) EC tablet 40 mg  40 mg Oral Daily Rexford Mausucker, Mary-Ashlyn, RPH   40 mg at 03/12/21 1038   polyethylene glycol (MIRALAX / GLYCOLAX) packet 17 g  17 g Oral Daily Alwyn RenMathews, Elizabeth G, MD   17 g at 03/12/21 1037   thiamine tablet 100 mg  100 mg Oral Daily Rodolph Bonghompson, Daniel V, MD   100 mg at 03/12/21 1038   umeclidinium-vilanterol (ANORO ELLIPTA) 62.5-25 MCG/INH 1 puff  1 puff Inhalation Daily Rodolph Bonghompson, Daniel V, MD   1 puff at 03/13/21 0750    Musculoskeletal   Psychiatric Specialty Exam:  Presentation  General Appearance: Appropriate for Environment; Casual  Eye Contact:Fair  Speech:Clear and Coherent; Normal Rate  Speech Volume:Normal  Handedness:Right   Mood and Affect  Mood:Euthymic  Affect:Congruent; Appropriate   Thought Process  Thought Processes:Coherent; Goal Directed; Linear  Descriptions of Associations:Intact  Orientation:Full (Time, Place and Person)  Thought Content:Logical  History of Schizophrenia/Schizoaffective disorder:Yes  Duration of Psychotic Symptoms:Greater than six months  Hallucinations:Hallucinations: None Ideas of Reference:None  Suicidal Thoughts:Suicidal Thoughts: No Homicidal Thoughts:Homicidal Thoughts: No  Sensorium  Memory:Immediate Good; Recent Fair; Remote Fair  Judgment:Fair  Insight:Fair   Executive Functions  Concentration:Fair  Attention Span:Fair  Recall:Fair  Fund of Knowledge:Fair  Language:Fair   Psychomotor Activity  Psychomotor  Activity: Psychomotor Activity: Normal  Assets  Assets:Communication Skills; Desire for Improvement; Financial Resources/Insurance; Leisure Time; Physical Health; Resilience; Social Support   Sleep  Sleep: Sleep: Good  Physical Exam: Physical Exam Vitals and nursing note reviewed.  Constitutional:      Appearance: Normal appearance.  Neurological:     Mental Status: She is alert.  Psychiatric:        Attention and Perception: She perceives auditory and visual hallucinations.        Mood and Affect: Mood is depressed.  Speech: Speech normal.        Behavior: Behavior is agitated.        Thought Content: Thought content is paranoid and delusional.        Cognition and Memory: Memory is impaired.   ROS Blood pressure 135/82, pulse (!) 58, temperature 98.3 F (36.8 C), temperature source Oral, resp. rate 17, height 5\' 2"  (1.575 m), weight 76.4 kg, SpO2 99 %. Body mass index is 30.81 kg/m.   Disposition: No evidence of imminent risk to self or others at present.   Patient does not meet criteria for psychiatric inpatient admission. Supportive therapy provided about ongoing stressors. Refer to IOP. Discussed crisis plan, support from social network, calling 911, coming to the Emergency Department, and calling Suicide Hotline.  Schizoaffective: Patient continues to show much improvement, has stabilized during this hospitalization as a result of appropriate medication management to include upward titration of olanzapine and discontinuation of ziprasidone.  Earlier during this hospitalization her Depakote was continued, due to increased therapeutic levels.  Patient continues to do well at this time, subsequently is going to be psychiatrically cleared and is able to discharge home once medically stable.    No additional adjustments have been made at this time.  Patient will discharge home on Zyprexa 5 mg p.o. twice daily.  Patient and daughter have both been informed of  discontinuation of ziprasidone and Depakote.  Will need to speak with outpatient provider Lowella Bandy, to discuss reinitiation of mood stabilizer if deemed appropriate in an outpatient setting.  -After visit summary updated to reflect [facility] contact information.  Patient will benefit from outpatient neurological evaluation.  Patient is psychiatrically cleared at this time.  Psychiatry to sign off.    [facility], FNP 03/13/2021 10:05 AM

## 2021-03-13 NOTE — Discharge Summary (Signed)
Physician Discharge Summary  TOLUWANI RUDER ZOX:[id number] DOB: 06/04/1961 DOA: 03/04/2021  PCP: [name], NP  Admit date: 03/04/2021 Discharge date: 03/13/2021  Admitted From: Home Disposition: Home  Recommendations for Outpatient Follow-up:  Follow up with PCP in 1-2 weeks Please obtain BMP/CBC in one week Please follow up with [facility] Follow-up with psych Dr. Allyne Gee  Home Health: None Equipment/Devices: None Discharge Condition: Stable CODE STATUS: Full code Diet recommendation: Cardiac Brief/Interim Summary:60 year old female history of bipolar 1 disorder, PTSD, schizoaffective disorder, COPD recently hospitalized (7/4-7/5) for hypokalemia and acute kidney injury during the hospitalization noted to have hallucinations who presents back to the ED after a fall.  It is noted that patient is having both auditory and visual hallucinations which have worsened recently per daughter with some associated decreased oral intake and a 20 pound weight loss over the past month.  Infectious work-up done negative.  Comprehensive metabolic profile noted patient to be hypokalemic in acute kidney injury.  SARS coronavirus 2 PCR done negative.  Patient admitted for electrolyte abnormalities, acute kidney injury, auditory and visual hallucinations.  Psychiatry consulted.    Discharge Diagnoses:  Principal Problem:   Hypokalemia Active Problems:   COPD (chronic obstructive pulmonary disease) (HCC)   GAD (generalized anxiety disorder)   GERD (gastroesophageal reflux disease)   Bipolar I disorder, current or most recent episode depressed, with psychotic features (HCC)   PTSD (post-traumatic stress disorder)   MDD (major depressive disorder)   AKI (acute kidney injury) (HCC)   Dehydration   Transaminitis   Auditory hallucination   Visual hallucinations   Acute metabolic encephalopathy    #1 acute metabolic encephalopathy in the setting of bipolar disorder-she was admitted with  auditory and visual hallucination.   Infectious work-up was negative.   CT head showed no acute abnormalities. Her Depakote level was high so that was discontinued.   Upon discharge psychiatry increased her Zyprexa to 5 mg twice a day, Geodon was completely stopped.  BuSpar and Klonopin was continued. B12  TSH folate rpr normal MRI brain -no acute findings.  Discussed with neurology on-call.  Neuro follow-up as an outpatient.     #2 AKI resolved with IV fluids.  Likely prerenal secondary to decreased p.o. intake in the setting of oral diuretics.  Patient lost 20 pounds in the past month due to decreased p.o. intake.    #3 severe hypokalemia resolved -which was thought to be due to HCTZ which has been stopped.  HCTZ was not  resumed on discharge.   #4 transaminitis likely secondary to dehydration.  Abdominal ultrasound shows sludge in the gallbladder without any acute cholecystitis.  LFTs trending down.  Hepatitis panel negative.   #5 odynophagia on dysphagia 3 diet.  Barium swallow unremarkable.she is tolerating a rdysphagia  diet .   #6 abdominal pain -resolved.ct negative.   #7 hypertension patient was on HCTZ at home which has been stopped due to hypokalemia.  She was started on Lopressor 12.5  daily.    Nutrition Problem: Inadequate oral intake Etiology:  (AMS)    Signs/Symptoms: per patient/family report     Interventions: Ensure Enlive (each supplement provides 350kcal and 20 grams of protein), MVI, Magic cup  Estimated body mass index is 30.81 kg/m as calculated from the following:   Height as of this encounter:  (1.575 m).   Weight as of this encounter: 76.4 kg.  Discharge Instructions  Discharge Instructions     Diet - low sodium heart healthy   Complete  by: As directed    Increase activity slowly   Complete by: As directed       Allergies as of 03/13/2021       Reactions   Penicillins Other (See Comments)   Childhood allergy- Reaction?? Has  patient had a PCN reaction causing immediate rash, facial/tongue/throat swelling, SOB or lightheadedness with hypotension: Unknown Has patient had a PCN reaction causing severe rash involving mucus membranes or skin necrosis: Unknown Has patient had a PCN reaction that required hospitalization: Unknown Has patient had a PCN reaction occurring within the last 10 years: Unknown If all of the above answers are "NO", then may proceed with Cephalosporin use.          Medication List     STOP taking these medications    busPIRone 15 MG tablet Commonly known as: BUSPAR   divalproex 500 MG DR tablet Commonly known as: DEPAKOTE   hydrochlorothiazide 12.5 MG tablet Commonly known as: HYDRODIURIL   hydrOXYzine 50 MG tablet Commonly known as: ATARAX/VISTARIL   QUEtiapine 400 MG tablet Commonly known as: SEROQUEL   ziprasidone 20 MG capsule Commonly known as: Geodon       TAKE these medications    albuterol 108 (90 Base) MCG/ACT inhaler Commonly known as: VENTOLIN HFA Inhale 1 puff into the lungs every 4 (four) hours as needed for wheezing or shortness of breath. Ventolin   Anoro Ellipta 62.5-25 MCG/INH Aepb Generic drug: umeclidinium-vilanterol Inhale 1 puff into the lungs daily.   benztropine 1 MG tablet Commonly known as: COGENTIN TAKE 1 TABLET(1 MG) BY MOUTH TWICE DAILY What changed:  how much to take how to take this when to take this additional instructions   fluticasone 50 MCG/ACT nasal spray Commonly known as: FLONASE Place 2 sprays into both nostrils daily.   metoprolol tartrate 25 MG tablet Commonly known as: LOPRESSOR Take 0.5 tablets (12.5 mg total) by mouth daily.   nicotine 21 mg/24hr patch Commonly known as: NICODERM CQ - dosed in mg/24 hours Place 1 patch (21 mg total) onto the skin daily. What changed:  when to take this reasons to take this   OLANZapine zydis 5 MG disintegrating tablet Commonly known as: ZYPREXA Take 1 tablet (5 mg total)  by mouth 2 (two) times daily.       ASK your doctor about these medications    clonazePAM 0.5 MG tablet Commonly known as: KLONOPIN TAKE 1 TABLET(0.5 MG) BY MOUTH TWICE DAILY        Follow-up Information     [facility]. Schedule an appointment as soon as possible for a visit in 1 day(s).   Why: Follow up MRI results and cognitive evaluation. Contact information: [address]     [address] [place] [address] [phone]        [facility]. Call in 1 day(s).   Specialty: Urgent Care Why: Make a follow up appointment for medication adjustment. Contact information: [address] [place] [address] [phone]        [name], NP Follow up.   Specialty: Nurse Practitioner Contact information: [address] [place] [address] [phone]                Allergies  Allergen Reactions   Penicillins Other (See Comments)    Childhood allergy- Reaction?? Has patient had a PCN reaction causing immediate rash, facial/tongue/throat swelling, SOB or lightheadedness with hypotension: Unknown Has patient had a PCN reaction causing severe  rash involving mucus membranes or skin necrosis: Unknown Has patient had a PCN reaction that required hospitalization: Unknown Has patient had a PCN reaction occurring within the last 10 years: Unknown If all of the above answers are "NO", then may proceed with Cephalosporin use.      Consultations: psych   Procedures/Studies: CT Head Wo Contrast  Result Date: 03/04/2021 CLINICAL DATA:  60 year old female with fall, confusion and altered mental status. EXAM: CT HEAD WITHOUT CONTRAST CT CERVICAL SPINE WITHOUT CONTRAST TECHNIQUE: Multidetector CT imaging of the head and cervical spine was performed following the standard protocol without intravenous contrast. Multiplanar CT image reconstructions of the cervical spine were also  generated. COMPARISON:  02/26/2021 and prior studies FINDINGS: CT HEAD FINDINGS Brain: No evidence of acute infarction, hemorrhage, hydrocephalus, extra-axial collection or mass lesion/mass effect. White matter hypodensities are unchanged. Vascular: No hyperdense vessel or unexpected calcification. Skull: Normal. Negative for fracture or focal lesion. Sinuses/Orbits: A LEFT mastoid effusion is again noted. Other: None CT CERVICAL SPINE FINDINGS Alignment: Normal. Skull base and vertebrae: No acute fracture. No primary bone lesion or focal pathologic process. Soft tissues and spinal canal: No prevertebral fluid or swelling. No visible canal hematoma. Disc levels: Mild multilevel degenerative disc disease/spondylosis and moderate multilevel facet arthropathy again noted. Upper chest: No acute abnormality Other: None IMPRESSION: 1. No evidence of acute intracranial abnormality. Chronic small-vessel white matter ischemic changes. 2. No static evidence of acute injury to the cervical spine. Electronically Signed   By: Harmon PierJeffrey  Hu M.D.   On: 03/04/2021 13:35   CT Head Wo Contrast  Result Date: 02/26/2021 CLINICAL DATA:  Mental status change.  Psychosis. EXAM: CT HEAD WITHOUT CONTRAST TECHNIQUE: Contiguous axial images were obtained from the base of the skull through the vertex without intravenous contrast. COMPARISON:  MR head 10/21/2019 FINDINGS: Brain: No evidence of acute infarction, hemorrhage, hydrocephalus, extra-axial collection or mass lesion/mass effect. Vascular: No hyperdense vessel or unexpected calcification. Skull: Normal. Negative for fracture or focal lesion. Sinuses/Orbits: Paranasal sinuses are clear. Left mastoid air cell effusion Other: None IMPRESSION: 1. No acute intracranial abnormalities. 2. Left mastoid air cell effusion. Electronically Signed   By: Signa Kellaylor  Stroud M.D.   On: 02/26/2021 12:00   CT Cervical Spine Wo Contrast  Result Date: 03/04/2021 CLINICAL DATA:  [age] year old female with  fall, confusion and altered mental status. EXAM: CT HEAD WITHOUT CONTRAST CT CERVICAL SPINE WITHOUT CONTRAST TECHNIQUE: Multidetector CT imaging of the head and cervical spine was performed following the standard protocol without intravenous contrast. Multiplanar CT image reconstructions of the cervical spine were also generated. COMPARISON:  02/26/2021 and prior studies FINDINGS: CT HEAD FINDINGS Brain: No evidence of acute infarction, hemorrhage, hydrocephalus, extra-axial collection or mass lesion/mass effect. White matter hypodensities are unchanged. Vascular: No hyperdense vessel or unexpected calcification. Skull: Normal. Negative for fracture or focal lesion. Sinuses/Orbits: A LEFT mastoid effusion is again noted. Other: None CT CERVICAL SPINE FINDINGS Alignment: Normal. Skull base and vertebrae: No acute fracture. No primary bone lesion or focal pathologic process. Soft tissues and spinal canal: No prevertebral fluid or swelling. No visible canal hematoma. Disc levels: Mild multilevel degenerative disc disease/spondylosis and moderate multilevel facet arthropathy again noted. Upper chest: No acute abnormality Other: None IMPRESSION: 1. No evidence of acute intracranial abnormality. Chronic small-vessel white matter ischemic changes. 2. No static evidence of acute injury to the cervical spine. Electronically Signed   By: Harmon PierJeffrey  Hu M.D.   On: 03/04/2021 13:35   MR BRAIN W WO CONTRAST  Result Date: 03/08/2021 CLINICAL DATA:  Initial evaluation for altered mental status, unknown cause. EXAM: MRI HEAD WITHOUT AND WITH CONTRAST TECHNIQUE: Multiplanar, multiecho pulse sequences of the brain and surrounding structures were obtained without and with intravenous contrast. CONTRAST:  35mL GADAVIST GADOBUTROL 1 MMOL/ML IV SOLN COMPARISON:  Prior CT from 03/04/2021. FINDINGS: Brain: Examination moderately degraded by motion artifact. Cerebral volume within normal limits. Scattered patchy T2/FLAIR hyperintensity seen  within the periventricular and deep white matter both cerebral hemispheres, most consistent with chronic small vessel ischemic disease, mild in nature. No other focal parenchymal signal abnormality. No abnormal foci of restricted diffusion to suggest acute or subacute ischemia. Gray-white matter differentiation maintained. No encephalomalacia to suggest chronic cortical infarction. No foci of susceptibility artifact to suggest acute or chronic intracranial hemorrhage. No mass lesion, midline shift or mass effect. No hydrocephalus or extra-axial fluid collection. Pituitary gland and suprasellar region within normal limits. Midline structures intact. No definite pathologic enhancement. Vascular: Major intracranial vascular flow voids are grossly maintained at the skull base. Skull and upper cervical spine: Craniocervical junction within normal limits. Bone marrow signal intensity grossly normal. No scalp soft tissue abnormality. Sinuses/Orbits: Globes and orbital soft tissues demonstrate no acute finding. Paranasal sinuses are largely clear. Left mastoid effusion present. Visualized nasopharynx within normal limits. Other: None. IMPRESSION: 1. Motion degraded exam. 2. No acute intracranial abnormality. 3. Mild chronic microvascular ischemic disease for age. 4. Left mastoid effusion, of uncertain significance. Correlation with physical exam suggested. Electronically Signed   By: Rise Mu M.D.   On: 03/08/2021 03:03   US Abdomen Complete  Result Date: 03/05/2021 CLINICAL DATA:  Transaminitis EXAM: ABDOMEN ULTRASOUND COMPLETE COMPARISON:  None. FINDINGS: Gallbladder: Large amount of sludge within the gallbladder. Probable small stones also noted in the gallbladder. No wall thickening or sonographic Murphy's sign. Common bile duct: Diameter: Normal caliber, 3 mm Liver: No focal lesion identified. Within normal limits in parenchymal echogenicity. Portal vein is patent on color Doppler imaging with normal  direction of blood flow towards the liver. IVC: No abnormality visualized. Pancreas: Visualized portion unremarkable. Spleen: Size and appearance within normal limits. Right Kidney: Length: 9.9 cm. Echogenicity within normal limits. No mass or hydronephrosis visualized. Left Kidney: Length: 9.8 cm. Echogenicity within normal limits. No mass or hydronephrosis visualized. Abdominal aorta: No aneurysm visualized. Other findings: None. IMPRESSION: Sludge and stones noted within the gallbladder. No sonographic evidence of acute cholecystitis. Electronically Signed   By: Charlett Nose M.D.   On: 03/05/2021 08:16   CT ABDOMEN PELVIS W CONTRAST  Result Date: 03/07/2021 CLINICAL DATA:  60 year old female with abdominal pain. Concern for hernia. EXAM: CT ABDOMEN AND PELVIS WITH CONTRAST TECHNIQUE: Multidetector CT imaging of the abdomen and pelvis was performed using the standard protocol following bolus administration of intravenous contrast. CONTRAST:  67mL OMNIPAQUE IOHEXOL 300 MG/ML  SOLN COMPARISON:  Abdominal ultrasound dated 03/05/2021. FINDINGS: Lower chest: The visualized lung bases are clear. No intra-abdominal free air or free fluid. Hepatobiliary: No focal liver abnormality is seen. No gallstones, gallbladder wall thickening, or biliary dilatation. Pancreas: Unremarkable. No pancreatic ductal dilatation or surrounding inflammatory changes. Spleen: Normal in size without focal abnormality. Adrenals/Urinary Tract: The adrenal glands unremarkable. Subcentimeter right renal upper pole hypodense focus is too small to characterize. There is right renal upper pole cortical scarring. There is no hydronephrosis on either side. There is symmetric enhancement and excretion of contrast by both kidneys. The visualized ureters and the urinary bladder appear unremarkable. Stomach/Bowel: There is a small hiatal  hernia. There is sigmoid diverticulosis without active inflammatory changes. There is no bowel obstruction or active  inflammation. The appendix is normal. Vascular/Lymphatic: Mild aortoiliac atherosclerotic disease. The IVC is unremarkable. No portal venous gas. There is no adenopathy. Reproductive: The uterus is anteverted and grossly unremarkable. No adnexal masses. Other: None Musculoskeletal: Mild subcutaneous edema of the left buttock. No fluid collection. Faint 15 mm heterogeneous focus involving the lateral aspect of the left gluteus maximus over the greater trochanter of the femur (coronal 120/5 and axial 79/2) may be artifactual or represent a focal muscular contusion or traumatic injury. Correlation with clinical exam and point tenderness recommended. No fluid collection or large hematoma. No acute osseous pathology. IMPRESSION: 1. No acute intra-abdominal or pelvic pathology. No bowel obstruction. Normal appendix. 2. Sigmoid diverticulosis. 3. Artifact versus a small focal contusion or muscular injury in the lateral left gluteus maximus. Correlation with clinical exam and point tenderness recommended. No fluid collection or hematoma. 4. Aortic Atherosclerosis (ICD10-I70.0). Electronically Signed   By: Elgie Collard M.D.   On: 03/07/2021 20:40   DG Chest Portable 1 View  Result Date: 03/04/2021 CLINICAL DATA:  Altered mental status EXAM: PORTABLE CHEST 1 VIEW COMPARISON:  02/26/2021 FINDINGS: This is a low volume film with mild bibasilar atelectasis. There is no evidence of focal airspace disease, pulmonary edema, suspicious pulmonary nodule/mass, pleural effusion, or pneumothorax. No acute bony abnormalities are identified. IMPRESSION: Low volume film with mild bibasilar atelectasis. Electronically Signed   By: Harmon Pier M.D.   On: 03/04/2021 13:11   DG Chest Port 1 View  Result Date: 02/26/2021 CLINICAL DATA:  Altered mental status. Coughing and shortness of breath for several days. EXAM: PORTABLE CHEST 1 VIEW COMPARISON:  01/04/2018. FINDINGS: Cardiac silhouette is normal in size. Normal mediastinal and  hilar contours. Clear lungs.  No convincing pleural effusion or pneumothorax. Skeletal structures are grossly intact. IMPRESSION: No active disease. Electronically Signed   By: Amie Portland M.D.   On: 02/26/2021 12:21   EEG adult  Result Date: 03/07/2021 Charlsie Quest, MD     03/07/2021  3:27 PM Patient Name: CORISSA OGUINN MRN: [id number] Epilepsy Attending: Charlsie Quest Referring Physician/Provider: Dr. Blanchard Mane Date: 03/07/2021 Duration: 23.21 mins Patient history: 60 year old female with altered mental status.  EEG to evaluate seizures. Level of alertness: Awake AEDs during EEG study: Klonopin Technical aspects: This EEG study was done with scalp electrodes positioned according to the 10-20 International system of electrode placement. Electrical activity was acquired at a sampling rate of 500Hz  and reviewed with a high frequency filter of 70Hz  and a low frequency filter of 1Hz . EEG data were recorded continuously and digitally stored. Description: The posterior dominant rhythm consists of 8 Hz activity of moderate voltage (25-35 uV) seen predominantly in posterior head regions, symmetric and reactive to eye opening and eye closing. Hyperventilation and photic stimulation were not performed.   IMPRESSION: This study is within normal limits. No seizures or epileptiform discharges were seen throughout the recording. Priyanka   DG ESOPHAGUS W SINGLE CM (SOL OR THIN BA)  Result Date: 03/06/2021 CLINICAL DATA:  Limited study due to patient immobility. EXAM: ESOPHOGRAM/BARIUM SWALLOW TECHNIQUE: Single contrast examination was performed using  thin barium. FLUOROSCOPY TIME:  Fluoroscopy Time:  2 minutes and 6 seconds. Radiation Exposure Index (if provided by the fluoroscopic device): 27 mGy Number of Acquired Spot Images: COMPARISON:  None. FINDINGS: Single contrast imaging shows no gross esophageal mass or stricture. No esophageal diverticulum. Small  hiatal hernia. Mass-effect noted in  the proximal stomach, potentially from food. Gastric emptying is prompt. IMPRESSION: Limited study due to patient immobility. No gross esophageal mass lesion or stricture. Small hiatal hernia. Filling defect in the stomach likely represents food material. Further evaluation with upper GI series after an appropriate period of fasting could be used to further evaluate as clinically warranted. This study should be deferred until the patient is able to better cooperate with positioning. If immediate assessment is required, CT abdomen/pelvis could be used to further evaluate. Electronically Signed   By: Kennith Center M.D.   On: 03/06/2021 14:58   (Echo, Carotid, EGD, Colonoscopy, ERCP)    Subjective: She is resting in bed she has no new complaints today she is anxious to go home  Discharge Exam: Vitals:   03/13/21 0441 03/13/21 0750  BP: 135/82   Pulse: (!) 58   Resp: 17   Temp: 98.3 F (36.8 C)   SpO2: 99% 99%   Vitals:   03/12/21 1248 03/12/21 2030 03/13/21 0441 03/13/21 0750  BP: 108/75 123/74 135/82   Pulse: 70 73 (!) 58   Resp: Temp: 98.4 F (36.9 C) 98.1 F (36.7 C) 98.3 F (36.8 C)   TempSrc: Oral Oral Oral   SpO2: 99% 97% 99% 99%  Weight:      Height:        General: Pt is alert, awake, not in acute distress Cardiovascular: RRR, S1/S2 +, no rubs, no gallops Respiratory: CTA bilaterally, no wheezing, no rhonchi Abdominal: Soft, NT, ND, bowel sounds + Extremities: no edema, no cyanosis    The results of significant diagnostics from this hospitalization (including imaging, microbiology, ancillary and laboratory) are listed below for reference.     Microbiology: Recent Results (from the past 240 hour(s))  Resp Panel by RT-PCR (Flu A&B, Covid) Nasopharyngeal Swab     Status: None   Collection Time: 03/04/21  2:28 PM   Specimen: Nasopharyngeal Swab; Nasopharyngeal(NP) swabs in vial transport medium  Result Value Ref Range Status   SARS Coronavirus 2 by RT PCR  NEGATIVE NEGATIVE Final    Comment: (NOTE) SARS-CoV-2 target nucleic acids are NOT DETECTED.  The SARS-CoV-2 RNA is generally detectable in upper respiratory specimens during the acute phase of infection. The lowest concentration of SARS-CoV-2 viral copies this assay can detect is 138 copies/mL. A negative result does not preclude SARS-Cov-2 infection and should not be used as the sole basis for treatment or other patient management decisions. A negative result may occur with  improper specimen collection/handling, submission of specimen other than nasopharyngeal swab, presence of viral mutation(s) within the areas targeted by this assay, and inadequate number of viral copies(<138 copies/mL). A negative result must be combined with clinical observations, patient history, and epidemiological information. The expected result is Negative.  Fact Sheet for Patients:  BloggerCourse.com  Fact Sheet for Healthcare Providers:  SeriousBroker.it  This test is no t yet approved or cleared by the [place] FDA and  has been authorized for detection and/or diagnosis of SARS-CoV-2 by FDA under an Emergency Use Authorization (EUA). This EUA will remain  in effect (meaning this test can be used) for the duration of the COVID-19 declaration under Section 564(b)(1) of the Act, 21 U.S.C.section 360bbb-3(b)(1), unless the authorization is terminated  or revoked sooner.       Influenza A by PCR NEGATIVE NEGATIVE Final   Influenza B by PCR NEGATIVE NEGATIVE Final    Comment: (NOTE) The  Xpert Xpress SARS-CoV-2/FLU/RSV plus assay is intended as an aid in the diagnosis of influenza from Nasopharyngeal swab specimens and should not be used as a sole basis for treatment. Nasal washings and aspirates are unacceptable for Xpert Xpress SARS-CoV-2/FLU/RSV testing.  Fact Sheet for Patients: BloggerCourse.com  Fact Sheet for  Healthcare Providers: SeriousBroker.it  This test is not yet approved or cleared by the [place] FDA and has been authorized for detection and/or diagnosis of SARS-CoV-2 by FDA under an Emergency Use Authorization (EUA). This EUA will remain in effect (meaning this test can be used) for the duration of the COVID-19 declaration under Section 564(b)(1) of the Act, 21 U.S.C. section 360bbb-3(b)(1), unless the authorization is terminated or revoked.  Performed at [facility], [address] [place], [place] [address]   Culture, Urine     Status: None   Collection Time: 03/05/21  4:14 AM   Specimen: Urine, Clean Catch  Result Value Ref Range Status   Specimen Description   Final    URINE, CLEAN CATCH Performed at [facility], [address], [place], [place] [address]    Special Requests   Final    NONE Performed at [facility], [address] [place], [place] [address]    Culture   Final    NO GROWTH Performed at [facility], [address], [place], [place] [address]    Report Status 03/06/2021 FINAL  Final  SARS CORONAVIRUS 2 (TAT 6-24 HRS) Nasopharyngeal Nasopharyngeal Swab     Status: None   Collection Time: 03/11/21 12:52 PM   Specimen: Nasopharyngeal Swab  Result Value Ref Range Status   SARS Coronavirus 2 NEGATIVE NEGATIVE Final    Comment: (NOTE) SARS-CoV-2 target nucleic acids are NOT DETECTED.  The SARS-CoV-2 RNA is generally detectable in upper and lower respiratory specimens during the acute phase of infection. Negative results do not preclude SARS-CoV-2 infection, do not rule out co-infections with other pathogens, and should not be used as the sole basis for treatment or other patient management decisions. Negative results must be combined with clinical observations, patient history, and epidemiological information. The expected result is Negative.  Fact Sheet  for Patients: HairSlick.no  Fact Sheet for Healthcare Providers: quierodirigir.com  This test is not yet approved or cleared by the [place] FDA and  has been authorized for detection and/or diagnosis of SARS-CoV-2 by FDA under an Emergency Use Authorization (EUA). This EUA will remain  in effect (meaning this test can be used) for the duration of the COVID-19 declaration under Se ction 564(b)(1) of the Act, 21 U.S.C. section 360bbb-3(b)(1), unless the authorization is terminated or revoked sooner.  Performed at [facility], [address] [place], [place] [address]      Labs: BNP (last 3 results) No results for input(s): BNP in the last 8760 hours. Basic Metabolic Panel: Recent Labs  Lab 03/07/21 0508 03/09/21 1249 03/11/21 0718  NA 139 140 140  K 4.3 3.7 4.1  CL 103 102 104  CO2 GLUCOSE 106* 101* 90  BUN <5* 12 13  CREATININE 1.09* 1.12* 1.15*  CALCIUM 8.5* 8.1* 8.1*  MG 2.0  --   --   PHOS 3.2  --   --    Liver Function Tests: Recent Labs  Lab 03/07/21 0508 03/09/21 1249 03/11/21 0718  AST 52* 21 16  ALT 38 21 18  ALKPHOS 64 57 53  BILITOT 0.9 0.5 0.5  PROT 6.7 6.2* 6.0*  ALBUMIN 3.4* 2.9* 2.9*   No results for input(s): LIPASE, AMYLASE in the last 168 hours. No results for input(s): AMMONIA in the last 168 hours. CBC: Recent Labs  Lab 03/07/21 0508 03/09/21 1249 03/11/21 0718  WBC 6.0 6.1 6.7  HGB 14.5 13.4 12.8  HCT 44.2 41.4 39.6  MCV 91.5 92.0 93.0  PLT 304 305 329   Cardiac Enzymes: No results for input(s): CKTOTAL, CKMB, CKMBINDEX, TROPONINI in the last 168 hours. BNP: Invalid input(s): POCBNP CBG: Recent Labs  Lab 03/12/21 2031 03/13/21 0038 03/13/21 0438 03/13/21 0734 03/13/21 1130  GLUCAP 120* 114* 85 85 103*   D-Dimer No results for input(s): DDIMER in the last 72 hours. Hgb A1c No results for input(s): HGBA1C in the last 72 hours. Lipid  Profile No results for input(s): CHOL, HDL, LDLCALC, TRIG, CHOLHDL, LDLDIRECT in the last 72 hours. Thyroid function studies No results for input(s): TSH, T4TOTAL, T3FREE, THYROIDAB in the last 72 hours.  Invalid input(s): FREET3 Anemia work up No results for input(s): VITAMINB12, FOLATE, FERRITIN, TIBC, IRON, RETICCTPCT in the last 72 hours. Urinalysis    Component Value Date/Time   COLORURINE YELLOW 03/05/2021 0414   APPEARANCEUR CLEAR 03/05/2021 0414   LABSPEC 1.010 03/05/2021 0414   PHURINE 5.0 03/05/2021 0414   GLUCOSEU NEGATIVE 03/05/2021 0414   HGBUR NEGATIVE 03/05/2021 0414   BILIRUBINUR NEGATIVE 03/05/2021 0414   KETONESUR 20 (A) 03/05/2021 0414   PROTEINUR NEGATIVE 03/05/2021 0414   NITRITE NEGATIVE 03/05/2021 0414   LEUKOCYTESUR NEGATIVE 03/05/2021 0414   Sepsis Labs Invalid input(s): PROCALCITONIN,  WBC,  LACTICIDVEN Microbiology Recent Results (from the past 240 hour(s))  Resp Panel by RT-PCR (Flu A&B, Covid) Nasopharyngeal Swab     Status: None   Collection Time: 03/04/21  2:28 PM   Specimen: Nasopharyngeal Swab; Nasopharyngeal(NP) swabs in vial transport medium  Result Value Ref Range Status   SARS Coronavirus 2 by RT PCR NEGATIVE NEGATIVE Final    Comment: (NOTE) SARS-CoV-2 target nucleic acids are NOT DETECTED.  The SARS-CoV-2 RNA is generally detectable in upper respiratory specimens during the acute phase of infection. The lowest concentration of SARS-CoV-2 viral copies this assay can detect is 138 copies/mL. A negative result does not preclude SARS-Cov-2 infection and should not be used as the sole basis for treatment or other patient management decisions. A negative result may occur with  improper specimen collection/handling, submission of specimen other than nasopharyngeal swab, presence of viral mutation(s) within the areas targeted by this assay, and inadequate number of viral copies(<138 copies/mL). A negative result must be combined  with clinical observations, patient history, and epidemiological information. The expected result is Negative.  Fact Sheet for Patients:  BloggerCourse.com  Fact Sheet for Healthcare Providers:  SeriousBroker.it  This test is no t yet approved or cleared by the [place] FDA and  has been authorized for detection and/or diagnosis of SARS-CoV-2 by FDA under an Emergency Use Authorization (EUA). This EUA will remain  in effect (meaning this test can be used) for the duration of the COVID-19 declaration under Section 564(b)(1) of the Act, 21 U.S.C.section 360bbb-3(b)(1), unless the authorization is terminated  or revoked sooner.       Influenza A by PCR NEGATIVE NEGATIVE Final   Influenza B by PCR NEGATIVE NEGATIVE Final    Comment: (NOTE) The Xpert Xpress SARS-CoV-2/FLU/RSV plus assay is intended as an aid in the diagnosis of influenza from Nasopharyngeal swab specimens and should not be used as a  sole basis for treatment. Nasal washings and aspirates are unacceptable for Xpert Xpress SARS-CoV-2/FLU/RSV testing.  Fact Sheet for Patients: BloggerCourse.com  Fact Sheet for Healthcare Providers: SeriousBroker.it  This test is not yet approved or cleared by the [place] FDA and has been authorized for detection and/or diagnosis of SARS-CoV-2 by FDA under an Emergency Use Authorization (EUA). This EUA will remain in effect (meaning this test can be used) for the duration of the COVID-19 declaration under Section 564(b)(1) of the Act, 21 U.S.C. section 360bbb-3(b)(1), unless the authorization is terminated or revoked.  Performed at [facility], [address] [place], [place] [address]   Culture, Urine     Status: None   Collection Time: 03/05/21  4:14 AM   Specimen: Urine, Clean Catch  Result Value Ref Range Status   Specimen Description   Final     URINE, CLEAN CATCH Performed at [facility], [address], [place], [place] [address]    Special Requests   Final    NONE Performed at [facility], [address], [place], [place] [address]    Culture   Final    NO GROWTH Performed at [facility], [address], [place], [place] [address]    Report Status 03/06/2021 FINAL  Final  SARS CORONAVIRUS 2 (TAT 6-24 HRS) Nasopharyngeal Nasopharyngeal Swab     Status: None   Collection Time: 03/11/21 12:52 PM   Specimen: Nasopharyngeal Swab  Result Value Ref Range Status   SARS Coronavirus 2 NEGATIVE NEGATIVE Final    Comment: (NOTE) SARS-CoV-2 target nucleic acids are NOT DETECTED.  The SARS-CoV-2 RNA is generally detectable in upper and lower respiratory specimens during the acute phase of infection. Negative results do not preclude SARS-CoV-2 infection, do not rule out co-infections with other pathogens, and should not be used as the sole basis for treatment or other patient management decisions. Negative results must be combined with clinical observations, patient history, and epidemiological information. The expected result is Negative.  Fact Sheet for Patients: HairSlick.no  Fact Sheet for Healthcare Providers: quierodirigir.com  This test is not yet approved or cleared by the [place] FDA and  has been authorized for detection and/or diagnosis of SARS-CoV-2 by FDA under an Emergency Use Authorization (EUA). This EUA will remain  in effect (meaning this test can be used) for the duration of the COVID-19 declaration under Se ction 564(b)(1) of the Act, 21 U.S.C. section 360bbb-3(b)(1), unless the authorization is terminated or revoked sooner.  Performed at [facility], [address], [place], [place] [address]      Time coordinating discharge:  39 minutes  SIGNED:   Alwyn Ren, MD  [facility] 03/13/2021, 1:42 PM

## 2021-03-13 NOTE — TOC Progression Note (Signed)
Transition of Care [facility]) - Progression Note    Patient Details  Name: Michaela Wells MRN: [id number] Date of Birth: [birth year]  Transition of Care [facility]) CM/SW Contact  Darleene Cleaver, [place] Phone Number: 03/13/2021, 11:27 AM  Clinical Narrative:     Per psych, patient has been psychiatrically cleared.  CSW to sign off, please reconsult if social work needs arise.   Expected Discharge Plan: Psychiatric Hospital Barriers to Discharge: Continued Medical Work up  Expected Discharge Plan and Services Expected Discharge Plan: Psychiatric Hospital         Expected Discharge Date:  (unknown)                                     Social Determinants of Health (SDOH) Interventions    Readmission Risk Interventions No flowsheet data found.

## 2021-03-14 ENCOUNTER — Telehealth: Payer: Self-pay

## 2021-03-14 NOTE — Telephone Encounter (Signed)
Transition Care Management Unsuccessful Follow-up Telephone Call  Date of discharge and from where:  03/13/2021, [facility]   Attempts:  1st Attempt  Reason for unsuccessful TCM follow-up call:  Unable to leave message call placed to # [phone] .  Patient has appointment with Ricky Stabs, NP  03/27/2021 @ [facility]

## 2021-03-15 ENCOUNTER — Telehealth: Payer: Self-pay

## 2021-03-15 NOTE — Telephone Encounter (Signed)
Transition Care Management Follow-up Telephone Call Date of discharge and from where: 03/13/2021, [facility]  How have you been since you were released from the hospital? She said she is feeling good.  Any questions or concerns? Yes - she said that the left side of her buttocks hurts when she sits down but she has no pain with ambulation. She thinks this may be related to her fall prior to her hospitalization. She said that she has been having difficulty sleeping and her sleeping medications have been discontinued.  Informed her that this information would be shared with PCP.  She has applied for [place] Medicaid and hopes it will be effective by the beginning of August.    Items Reviewed: Did the pt receive and understand the discharge instructions provided? Yes  Medications obtained and verified?  She said that she has all medications except the metoprolol tartrate and klonopin.  The klonopin is not eligible for refill until 03/17/2021. The metoprolol needs to be picked up from the pharmacy. She said that her children pick up her medications at the pharmacy for her and they discarded all medications that she is to stop taking.  She explained that she then manages her own medications and she did not have any questions about the med regime but will bring all of the meds with her to her upcoming appointment with PCP.  Other? No  Any new allergies since your discharge? No  Dietary orders reviewed? No Do you have support at home? Yes  - She lives with her daughter and grandchildren.  She said she has been staying on the first floor of the home instead of second floor.   Home Care and Equipment/Supplies: Were home health services ordered? no If so, what is the name of the agency? N/a  Has the agency set up a time to come to the patient's home? not applicable Were any new equipment or medical supplies ordered?  No What is the name of the medical supply agency? N/a Were you able to get the  supplies/equipment? not applicable Do you have any questions related to the use of the equipment or supplies? No  Functional Questionnaire: (I = Independent and D = Dependent) ADLs: independent.    Follow up appointments reviewed:  PCP Hospital f/u appt confirmed? Yes  Scheduled to see Ricky Stabs, NP on 03/27/2021 @ 1410. Specialist Hospital f/u appt confirmed? Yes  Scheduled to see Behavioral health  - 04/09/2021; she needs to call neurology for appointment.    Are transportation arrangements needed? No  If their condition worsens, is the pt aware to call PCP or go to the Emergency Dept.? Yes Was the patient provided with contact information for the PCP's office or ED? Yes Was to pt encouraged to call back with questions or concerns? Yes

## 2021-03-26 NOTE — Progress Notes (Addendum)
TRANSITION OF CARE VISIT   Primary Care Provider (PCP):      Ricky Stabs, NP                                                       [facility] [address] [place],  [place]  [address] Phone: [phone] Fax: [phone]     Date of Admission: 03/04/2021  Date of Discharge: 03/13/2021  Transitions of Care Call: 03/15/2021 with Robyne Peers, RN   Discharged from: [facility]   Discharge Diagnosis:  Principal Problem:   Hypokalemia Active Problems:   COPD (chronic obstructive pulmonary disease) (HCC)   GAD (generalized anxiety disorder)   GERD (gastroesophageal reflux disease)   Bipolar I disorder, current or most recent episode depressed, with psychotic features (HCC)   PTSD (post-traumatic stress disorder)   MDD (major depressive disorder)   AKI (acute kidney injury) (HCC)   Dehydration   Transaminitis   Auditory hallucination   Visual hallucinations   Acute metabolic encephalopathy  Summary of Admission:  Recommendations for Outpatient Follow-up:  Follow up with PCP in 1-2 weeks Please obtain BMP/CBC in one week Please follow up with [place] neurology Follow-up with psych Dr. Allyne Gee   Home Health: None Equipment/Devices: None Discharge Condition: Stable CODE STATUS: Full code Diet recommendation: Cardiac Brief/Interim Summary:60 year old female history of bipolar 1 disorder, PTSD, schizoaffective disorder, COPD recently hospitalized (7/4-7/5) for hypokalemia and acute kidney injury during the hospitalization noted to have hallucinations who presents back to the ED after a fall.  It is noted that patient is having both auditory and visual hallucinations which have worsened recently per daughter with some associated decreased oral intake and a 20 pound weight loss over the past month.  Infectious work-up done negative.  Comprehensive metabolic profile noted  patient to be hypokalemic in acute kidney injury.  SARS coronavirus 2 PCR done negative.  Patient admitted for electrolyte abnormalities, acute kidney injury, auditory and visual hallucinations.  Psychiatry consulted.    #1 acute metabolic encephalopathy in the setting of bipolar disorder-she was admitted with auditory and visual hallucination.   Infectious work-up was negative.   CT head showed no acute abnormalities. Her Depakote level was high so that was discontinued.   Upon discharge psychiatry increased her Zyprexa to 5 mg twice a day, Geodon was completely stopped.  BuSpar and Klonopin was continued. B12  TSH folate rpr normal MRI brain -no acute findings.  Discussed with neurology on-call.  Neuro follow-up as an outpatient.   #2 AKI resolved with IV fluids.  Likely prerenal secondary to decreased p.o. intake in the setting of oral diuretics.  Patient lost 20 pounds in the past month due to decreased p.o. intake.   #3 severe hypokalemia resolved -which was thought to be due to HCTZ which has been stopped.  HCTZ was not  resumed on discharge.   #4 transaminitis likely secondary to dehydration.  Abdominal ultrasound shows sludge in the gallbladder without any acute cholecystitis.  LFTs trending down.  Hepatitis panel negative.   #5 odynophagia on dysphagia 3 diet.  Barium swallow unremarkable.she is tolerating a rdysphagia  diet .   #6 abdominal pain -resolved.ct negative.   #7 hypertension patient was on HCTZ at home which has been stopped due to hypokalemia.  She was  started on Lopressor 12.5  daily.    TODAY's VISIT ACUTE METABOLIC ENCEPHALOPATHY FOLLOW-UP: Reports feeling better when she is in the hospital because she gets more medications to help her. Feeling like she going back to her old self because when she gets home things start happening. Also, reports concern that she was discharged home on less medications than she was given in the hospital. Still having body pains from  fall prior to hospital admission.   Still hearing voices. She calls them neighbors living in her head. Reports they hurt her or try to hurt her and they are mean to her. Reports there is only one nice neighbor.They make her pee on herself. Reports they were the reason she fell down the stairs because they took her breath away. They tell her to take a bunch of pills to kill herself. Reports she would never do that because she has her grandchildren and children to live for.   Reports she is still seeing things. Reports this is new and began during hospital admission. For example says she saw a cat in her hospital room that nobody else could see. Recently saw people outside of her house. She went to get her daughter and her daughter said they were people from the television reflecting on the window.   Reports someone told her children to throw away all of her medications so she isn't sure which ones she is supposed to be taking. Reports she has seen so many providers and they keep changing medications that she had a drawer full of medications that were not being used. Reports she kept the medications just in case she needed to switch back to them.   Reports seeing Psychiatry a couple months ago and plans to follow-up with them soon.  Reports she feels very crazy and tearful.  She is still taking Zyprexa.   2. ODYNOPHAGIA FOLLOW-UP: Reports the only reason she had difficulty swallowing in the hospital was because they pills were too large. Denies having trouble with swallowing currently. She is using a pill crusher or a pill cutter to get medications down safely.   3. HYPERTENSION FOLLOW-UP: Reports home blood pressures are normal but unsure exactly what they numbers are. Concern that blood pressures may be increasing. Still taking Metoprolol.   Depression screen [facility] 2/9 03/27/2021 02/05/2021 01/12/2021 11/08/2020 10/02/2020  Decreased Interest 0 3 0 3 2  Down, Depressed, Hopeless 0 3 0 3 2  PHQ - 2  Score 0 6 0 6 4  Altered sleeping 0 3 0 3 3  Tired, decreased energy 0 3 0 3 3  Change in appetite 0 0 0 3 3  Feeling bad or failure about yourself  0 3 0 3 3  Trouble concentrating 0 3 0 3 3  Moving slowly or fidgety/restless 0 3 0 3 3  Suicidal thoughts 0 0 0 0 1  PHQ-9 Score 0 21 0 24 23  Difficult doing work/chores Not difficult at all Extremely dIfficult Not difficult at all Extremely dIfficult Extremely dIfficult     Patient/Caregiver self-reported problems/concerns: She is unsure of medications she is to take from Psychiatry but plans to follow-up soon with them.  MEDICATIONS  Medication Reconciliation conducted with patient/caregiver? (Yes/ No): Yes   New medications prescribed/discontinued upon discharge? (Yes/No): Yes, Metoprolol Tartrate  Barriers identified related to medications: No   LABS  Lab Reviewed (Yes/No/NA): Yes  PHYSICAL EXAM:  Vitals with BMI 03/27/2021 03/27/2021 03/13/2021  Height - 5' 1.732" -  Weight -  171 lbs 6 oz -  BMI - 31.62 -  Systolic 125 139 656  Diastolic 90 95 82  Pulse - 71 58    Physical Exam HENT:     Head: Normocephalic and atraumatic.  Eyes:     Extraocular Movements: Extraocular movements intact.     Conjunctiva/sclera: Conjunctivae normal.     Pupils: Pupils are equal, round, and reactive to light.  Cardiovascular:     Rate and Rhythm: Normal rate and regular rhythm.     Pulses: Normal pulses.     Heart sounds: Normal heart sounds.  Pulmonary:     Effort: Pulmonary effort is normal.     Breath sounds: Normal breath sounds.  Musculoskeletal:     Cervical back: Normal range of motion and neck supple.  Neurological:     General: No focal deficit present.     Mental Status: She is alert and oriented to person, place, and time.  Psychiatric:        Mood and Affect: Affect is tearful.    ASSESSMENT: 1. Hospital discharge follow-up: - Reviewed hospital course, current medications, ensured proper follow-up in place, and  addressed concerns.   2. Acute metabolic encephalopathy: 3. Auditory hallucination: 4. Visual hallucinations: - Ongoing auditory hallucinations and visual hallucinations.  - Referral to Neurology for further evaluation and management.  - Ambulatory referral to Neurology  5. Bipolar I disorder, current or most recent episode depressed, with psychotic features (HCC): 6. PTSD (post-traumatic stress disorder): 7. GAD (generalized anxiety disorder): 8. Moderate episode of recurrent major depressive disorder [facility]): - Patient denies thoughts of self-harm, suicidal ideations, and homicidal ideations.  - Continue current regimen.  - Keep all appointments with [facility]. Patient should return this month for follow-up visit. Patient reports she plans to call and schedule soon.  - Follow-up with primary provider as scheduled.  9. Hypokalemia: - Resolved prior to hospital discharge which was thought to be due to Hydrochlorothiazide which has been stopped.  - BMP to evaluate kidney function and electrolyte balance.  10. AKI (acute kidney injury) (HCC): 11. Dehydration: - BMP to evaluate kidney function and electrolyte balance. - CBC to screen for anemia.  12. Transaminitis: - Prior to hospital discharge determined likely secondary to dehydration.  Abdominal ultrasound shows sludge in the gallbladder without any acute cholecystitis.  LFTs trending down.  Hepatitis panel negative.  13. Essential hypertension: - Continue Metoprolol Tartrate as prescribed.  - Counseled on blood pressure goal of less than 130/80, low-sodium, DASH diet, medication compliance, 150 minutes of moderate intensity exercise per week as tolerated. Discussed medication compliance, adverse effects. - Follow-up with primary provider in 3 months or sooner if needed.    PATIENT EDUCATION PROVIDED: See AVS   FOLLOW-UP (Include any further testing or referrals):

## 2021-03-27 ENCOUNTER — Other Ambulatory Visit: Payer: Self-pay

## 2021-03-27 ENCOUNTER — Encounter: Payer: Medicaid Other | Admitting: Family

## 2021-03-27 VITALS — BP 125/90 | HR 71 | Temp 98.3°F | Resp 16 | Ht 61.73 in | Wt 171.4 lb

## 2021-03-27 DIAGNOSIS — E876 Hypokalemia: Secondary | ICD-10-CM

## 2021-03-27 DIAGNOSIS — R440 Auditory hallucinations: Secondary | ICD-10-CM

## 2021-03-27 DIAGNOSIS — I10 Essential (primary) hypertension: Secondary | ICD-10-CM

## 2021-03-27 DIAGNOSIS — Z09 Encounter for follow-up examination after completed treatment for conditions other than malignant neoplasm: Secondary | ICD-10-CM

## 2021-03-27 DIAGNOSIS — G9341 Metabolic encephalopathy: Secondary | ICD-10-CM

## 2021-03-27 DIAGNOSIS — K219 Gastro-esophageal reflux disease without esophagitis: Secondary | ICD-10-CM

## 2021-03-27 DIAGNOSIS — R7401 Elevation of levels of liver transaminase levels: Secondary | ICD-10-CM

## 2021-03-27 DIAGNOSIS — R441 Visual hallucinations: Secondary | ICD-10-CM

## 2021-03-27 DIAGNOSIS — F4310 Post-traumatic stress disorder, unspecified: Secondary | ICD-10-CM

## 2021-03-27 DIAGNOSIS — J449 Chronic obstructive pulmonary disease, unspecified: Secondary | ICD-10-CM

## 2021-03-27 DIAGNOSIS — F331 Major depressive disorder, recurrent, moderate: Secondary | ICD-10-CM

## 2021-03-27 DIAGNOSIS — F315 Bipolar disorder, current episode depressed, severe, with psychotic features: Secondary | ICD-10-CM

## 2021-03-27 DIAGNOSIS — E860 Dehydration: Secondary | ICD-10-CM

## 2021-03-27 DIAGNOSIS — F411 Generalized anxiety disorder: Secondary | ICD-10-CM

## 2021-03-27 DIAGNOSIS — N179 Acute kidney failure, unspecified: Secondary | ICD-10-CM

## 2021-03-27 NOTE — Addendum Note (Signed)
Addended by: Margorie John on: 03/27/2021 03:55 PM   Modules accepted: Orders

## 2021-03-27 NOTE — Addendum Note (Signed)
Addended by: [name] on: 03/27/2021 03:37 PM   Modules accepted: Orders

## 2021-03-27 NOTE — Progress Notes (Signed)
Pt presents for hospital f/u  

## 2021-03-27 NOTE — Addendum Note (Signed)
Addended by: [name] on: 03/27/2021 03:57 PM   Modules accepted: Level of Service

## 2021-03-27 NOTE — Addendum Note (Signed)
Addended by: Margorie John on: 03/27/2021 03:52 PM   Modules accepted: Orders

## 2021-03-27 NOTE — Addendum Note (Signed)
Addended by: [name] on: 03/27/2021 05:58 PM   Modules accepted: Orders

## 2021-03-27 NOTE — Addendum Note (Signed)
Addended by: Margorie John on: 03/27/2021 04:42 PM   Modules accepted: Orders

## 2021-03-27 NOTE — Addendum Note (Signed)
Addended by: Margorie John on: 03/27/2021 04:39 PM   Modules accepted: Orders

## 2021-03-28 LAB — CBC
Hematocrit: 37.5 % (ref 34.0–46.6)
Hemoglobin: 12.5 g/dL (ref 11.1–15.9)
MCH: 30.3 pg (ref 26.6–33.0)
MCHC: 33.3 g/dL (ref 31.5–35.7)
MCV: 91 fL (ref 79–97)
Platelets: 362 10*3/uL (ref 150–450)
RBC: 4.13 x10E6/uL (ref 3.77–5.28)
RDW: 15.2 % (ref 11.7–15.4)
WBC: 5.1 10*3/uL (ref 3.4–10.8)

## 2021-03-28 LAB — BASIC METABOLIC PANEL
BUN/Creatinine Ratio: 9 — ABNORMAL LOW (ref 12–28)
BUN: 8 mg/dL (ref 8–27)
CO2: 20 mmol/L (ref 20–29)
Calcium: 8.6 mg/dL — ABNORMAL LOW (ref 8.7–10.3)
Chloride: 108 mmol/L — ABNORMAL HIGH (ref 96–106)
Creatinine, Ser: 0.85 mg/dL (ref 0.57–1.00)
Glucose: 80 mg/dL (ref 65–99)
Potassium: 4.9 mmol/L (ref 3.5–5.2)
Sodium: 140 mmol/L (ref 134–144)
eGFR: 78 mL/min/{1.73_m2} (ref >59)

## 2021-04-09 ENCOUNTER — Telehealth (INDEPENDENT_AMBULATORY_CARE_PROVIDER_SITE_OTHER): Payer: Medicaid Other | Admitting: Psychiatry

## 2021-04-09 ENCOUNTER — Other Ambulatory Visit: Payer: Self-pay

## 2021-04-09 ENCOUNTER — Encounter (HOSPITAL_COMMUNITY): Payer: Self-pay | Admitting: Psychiatry

## 2021-04-09 DIAGNOSIS — F1290 Cannabis use, unspecified, uncomplicated: Secondary | ICD-10-CM | POA: Diagnosis not present

## 2021-04-09 DIAGNOSIS — F17200 Nicotine dependence, unspecified, uncomplicated: Secondary | ICD-10-CM

## 2021-04-09 DIAGNOSIS — F411 Generalized anxiety disorder: Secondary | ICD-10-CM | POA: Diagnosis not present

## 2021-04-09 DIAGNOSIS — F250 Schizoaffective disorder, bipolar type: Secondary | ICD-10-CM | POA: Diagnosis not present

## 2021-04-09 MED ORDER — NICOTINE 21 MG/24HR TD PT24: 21.0000 mg | MEDICATED_PATCH | Freq: Every day | TRANSDERMAL | 0 refills | Status: DC

## 2021-04-09 MED ORDER — DULOXETINE HCL 20 MG PO CPEP: 20.0000 mg | ORAL_CAPSULE | Freq: Every day | ORAL | 2 refills | Status: DC

## 2021-04-09 MED ORDER — BENZTROPINE MESYLATE 1 MG PO TABS: ORAL_TABLET | ORAL | 2 refills | Status: DC

## 2021-04-09 MED ORDER — OLANZAPINE 5 MG PO TBDP: 5.0000 mg | ORAL_TABLET | Freq: Two times a day (BID) | ORAL | 2 refills | Status: DC

## 2021-04-09 MED ORDER — CLONAZEPAM 0.5 MG PO TABS: ORAL_TABLET | ORAL | 2 refills | Status: DC

## 2021-04-09 NOTE — Progress Notes (Signed)
BH MD/PA/NP OP Progress Note Virtual Visit via Video Note  I connected with Jenne PaneRhonda L Mesick on 04/09/21 at  3:00 PM EDT by a video enabled telemedicine application and verified that I am speaking with the correct person using two identifiers.  Location: Patient: Home Provider: Clinic   I discussed the limitations of evaluation and management by telemedicine and the availability of in person appointments. The patient expressed understanding and agreed to proceed.  I provided 30 minutes of non-face-to-face time during this encounter.        04/09/2021 5:09 PM Jenne PaneRhonda L Seeberger  MRN:  [id number]  Chief Complaint: "I still hear my neighbors"  HPI: 60 year old female seen today for follow-up psychiatric evaluation.  She has a psychiatric history of schizoaffective disorder, bipolar disorder, anxiety, and depression.  Patient notes recently hospitalized on 7/4-7/5 for hypokalemia and acute kidney injury during the hospitalization noted to have hallucinations who presents back to the ED after a fall.  She was hospitalized again on 03/04/2021 through 03/13/2021.  Per chart review "it is noted that patient is having both auditory and visual hallucinations which have worsened recently per daughter with some associated decreased oral intake and a 20 pound weight loss over the past month.  Infectious work-up done negative.  Comprehensive metabolic profile noted patient to be hypokalemic in acute kidney injury".  While hospitalized her medications were readjusted.  Currently she is on Zyprexa 5 mg twice daily, Cogentin 1 mg twice daily, Klonopin 0.5 mg twice daily, NicoDerm CQ 21 mg patches daily.  She informed Clinical research associatewriter that her medications are somewhat effective in managing her psychiatric conditions.   Today was pleasant, cooperative, engaged in conversation, and maintained eye contact.  She informed Clinical research associatewriter that since her hospitalization she continues to hear her neighbors from [place] talking to her.  She  also notes that they cause her pain.  She informed Clinical research associatewriter that while hospitalized she did not have hallucinations.  She notes that she was not smoking marijuana or tobacco.  She informed Clinical research associatewriter that she did stop using the substances for 2 months however reports that it helps manages her anxiety and now notes that she has restarted.  Provider informed patient that the marijuana could potentially be causing these hallucinations.  She endorsed understanding but notes that she finds it helpful.  Patient notes she continues to be anxious and depressed most days.  Provider conducted a GAD-7 and patient scored a 21, at last visit she scored a 20.  Provider also conducted a PHQ-9 and patient scored a 22, at her last visit she scored a 21.  She notes that since returning home from the hospital her sleep has been poor sleep.  She notes that at night she has pain in her legs which wakes her up through the night.  Today she endorses passive SI however denies wanting to harm herself.  She denies SI/HI or mania.  She does endorse some paranoia regarding her neighbors threatening her.    At this time patient is agreeable to starting Cymbalta 20 mg to help manage anxiety, depression, and pain.  Zyprexa continued for now.  Provider informed patient that Geodon could be restarted in the future if symptoms worsen.  She endorsed understanding and agreed.  Depakote not restarted due to increased Depakote levels while at hospital. ent asked to increase Klonopin.  Provider informed patient that Klonopin would not be increased at this time.  Provider informed patient that she cannot catch her 0.5 mg tablet in half if  she needs an extra dose during the day.  She endorsed understanding and agreed. She will continue her other medications as prescribed.  No other concerns noted at this time.    Visit Diagnosis:    ICD-10-CM   1. Marijuana use  F12.90     2. Schizoaffective disorder, bipolar type (HCC)  F25.0 benztropine (COGENTIN) 1  MG tablet    OLANZapine zydis (ZYPREXA) 5 MG disintegrating tablet    DULoxetine (CYMBALTA) 20 MG capsule    3. GAD (generalized anxiety disorder)  F41.1 clonazePAM (KLONOPIN) 0.5 MG tablet    DULoxetine (CYMBALTA) 20 MG capsule    4. Tobacco use disorder  F17.200 nicotine (NICODERM CQ - DOSED IN MG/24 HOURS) 21 mg/24hr patch      Past Psychiatric History:  schizoaffective disorder, bipolar disorder, anxiety, and depression  Past Medical History:  Past Medical History:  Diagnosis Date   Bronchial asthma    Chicken pox    COPD (chronic obstructive pulmonary disease) (HCC)    Depression    Frequent headaches    GERD (gastroesophageal reflux disease)    UTI (lower urinary tract infection)     Past Surgical History:  Procedure Laterality Date   ESOPHAGEAL DILATION     TONSILLECTOMY      Family Psychiatric History:  Daughter Bipolar and SA, Daughter anxiety, Son anxiety  Family History:  Family History  Problem Relation Age of Onset   Other Mother        MVA-Deceased [pt was age 38]   COPD Father        Deceased   Heart attack Maternal Grandmother    Stroke Maternal Grandmother    Brain cancer Maternal Grandmother    Arthritis/Rheumatoid Maternal Grandmother    Heart attack Maternal Grandfather    Stroke Maternal Grandfather    Heart disease Maternal Aunt    Stroke Maternal Aunt        #1   Heart attack Maternal Aunt        #1   Hypertension Maternal Aunt        #1   Arthritis/Rheumatoid Sister    COPD Sister    Heart disease Maternal Uncle    Heart attack Maternal Uncle        x4   Heart disease Brother        #1   Heart attack Brother        #1   Diabetes Brother    Diabetes Maternal Aunt    Mental retardation Maternal Aunt    Healthy Daughter        x2   Drug abuse Daughter        #1   Drug abuse Daughter        #2   Kidney Stones Daughter        #2   Other Son        Agoraphobia    Social History:  Social History   Socioeconomic History    Marital status: Single    Spouse name: Not on file   Number of children: Not on file   Years of education: Not on file   Highest education level: Not on file  Occupational History   Not on file  Tobacco Use   Smoking status: Every Day   Smokeless tobacco: Never  Substance and Sexual Activity   Alcohol use: Yes   Drug use: Not Currently   Sexual activity: Not Currently  Other Topics Concern   Not on file  Social  History Narrative   Not on file   Social Determinants of Health   Financial Resource Strain: Not on file  Food Insecurity: Not on file  Transportation Needs: Not on file  Physical Activity: Not on file  Stress: Not on file  Social Connections: Not on file    Allergies:  Allergies  Allergen Reactions   Penicillins Other (See Comments)    Childhood allergy- Reaction?? Has patient had a PCN reaction causing immediate rash, facial/tongue/throat swelling, SOB or lightheadedness with hypotension: Unknown Has patient had a PCN reaction causing severe rash involving mucus membranes or skin necrosis: Unknown Has patient had a PCN reaction that required hospitalization: Unknown Has patient had a PCN reaction occurring within the last 10 years: Unknown If all of the above answers are "NO", then may proceed with Cephalosporin use.      Metabolic Disorder Labs: Lab Results  Component Value Date   HGBA1C 6.7 (H) 05/06/2018   MPG 145.59 05/06/2018   No results found for: PROLACTIN Lab Results  Component Value Date   CHOL 203 (H) 05/06/2018   TRIG 73 05/06/2018   HDL 51 05/06/2018   CHOLHDL 4.0 05/06/2018   VLDL 15 05/06/2018   LDLCALC 137 (H) 05/06/2018   Lab Results  Component Value Date   TSH 3.501 02/27/2021   TSH 3.388 10/23/2019    Therapeutic Level Labs: No results found for: LITHIUM Lab Results  Component Value Date   VALPROATE 56 03/08/2021   VALPROATE 132 (H) 03/06/2021   No components found for:  CBMZ  Current Medications: Current Outpatient  Medications  Medication Sig Dispense Refill   DULoxetine (CYMBALTA) 20 MG capsule Take 1 capsule (20 mg total) by mouth daily. 30 capsule 2   albuterol (VENTOLIN HFA) 108 (90 Base) MCG/ACT inhaler Inhale 1 puff into the lungs every 4 (four) hours as needed for wheezing or shortness of breath. Ventolin 1 each 2   benztropine (COGENTIN) 1 MG tablet TAKE 1 TABLET(1 MG) BY MOUTH TWICE DAILY 60 tablet 2   clonazePAM (KLONOPIN) 0.5 MG tablet TAKE 1 TABLET(0.5 MG) BY MOUTH TWICE DAILY 60 tablet 2   fluticasone (FLONASE) 50 MCG/ACT nasal spray Place 2 sprays into both nostrils daily.     metoprolol tartrate (LOPRESSOR) 25 MG tablet Take 0.5 tablets (12.5 mg total) by mouth daily. 30 tablet 2   nicotine (NICODERM CQ - DOSED IN MG/24 HOURS) 21 mg/24hr patch Place 1 patch (21 mg total) onto the skin daily. 28 patch 0   OLANZapine zydis (ZYPREXA) 5 MG disintegrating tablet Take 1 tablet (5 mg total) by mouth 2 (two) times daily. 60 tablet 2   umeclidinium-vilanterol (ANORO ELLIPTA) 62.5-25 MCG/INH AEPB Inhale 1 puff into the lungs daily. 30 each 2   No current facility-administered medications for this visit.     Musculoskeletal: Strength & Muscle Tone:  Unable to assess due to telehealth visit Gait & Station:  Unable to assess due to telehealth  visit Patient leans: N/A  Psychiatric Specialty Exam: Review of Systems  There were no vitals taken for this visit.There is no height or weight on file to calculate BMI.  General Appearance: Well Groomed  Eye Contact:  Good  Speech:  Clear and Coherent and Normal Rate  Volume:  Normal  Mood:  Anxious and Depressed  Affect:  Appropriate and Congruent  Thought Process:  Coherent, Goal Directed and Linear  Orientation:  Full (Time, Place, and Person)  Thought Content: Logical, Hallucinations: Auditory, Ideas of Reference:   Paranoia  Delusions and Paranoid Ideation   Suicidal Thoughts:  No  Homicidal Thoughts:  No  Memory:  Immediate;   Good Recent;    Good Remote;   Good  Judgement:  Good  Insight:  Fair  Psychomotor Activity:  Normal  Concentration:  Concentration: Good and Attention Span: Good  Recall:  Good  Fund of Knowledge: Good  Language: Good  Akathisia:  No  Handed:  Right  AIMS (if indicated): Not done  Assets:  Communication Skills Desire for Improvement Financial Resources/Insurance Housing Leisure Time Social Support  ADL's:  Intact  Cognition: WNL  Sleep:  Poor   Screenings: AIMS    Flowsheet Row [facility]  AIMS Total Score 0 0      AUDIT    Flowsheet Row Admission (Discharged) [facility]  Alcohol Use Disorder Identification Test Final Score (AUDIT) 5 4      GAD-7    Flowsheet Row Video Visit from 04/09/2021 in [facility] Video Visit from 02/05/2021 in [facility] Video Visit from 11/08/2020 in [facility] Video Visit from 10/02/2020 in [facility] Counselor from 06/26/2020 in [facility]  Total GAD-7 Score 21 20 21 21 16       PHQ2-9    Flowsheet Row Video Visit from 04/09/2021 in [facility] Erroneous Encounter from 03/27/2021 in [facility] at [facility] Video Visit from 02/05/2021 in [facility] Telemedicine from 01/12/2021 in Primary Care at [facility] Video Visit from 11/08/2020 in [facility]  PHQ-2 Total Score 5 0 6 0 6  PHQ-9 Total Score 22 0 21 0 24      Flowsheet Row Video Visit from 04/09/2021 in [facility]  ED to Hosp-Admission (Discharged) from 03/04/2021 in [place] [facility] 6 EAST ONCOLOGY ED to Hosp-Admission (Discharged) from 02/26/2021 in [place] LONG 4TH FLOOR PROGRESSIVE CARE AND UROLOGY  C-SSRS RISK CATEGORY Error: Q7 should not be populated when Q6 is No No Risk No Risk        Assessment and Plan: Patient endorses symptoms of anxiety, depression, AH, and paranoia.  At this time Zyprexa not readjusted.  Provider discussed restarting Geodon if symptoms does not progress.  She is agreeable to starting Cymbalta 20 mg daily to help manage anxiety, depression, and pain.  Patient asked to increase Klonopin.  Provider informed patient that Klonopin would not be increased at this time.  Provider informed patient that she cannot catch her 0.5 mg tablet in half if she needs an extra dose during the day.  She endorsed understanding and agreed.  1. Schizoaffective disorder, bipolar type (HCC)  Continue- benztropine (COGENTIN) 1 MG tablet; TAKE 1 TABLET(1 MG) BY MOUTH TWICE DAILY  Dispense: 60 tablet; Refill: 2 Continue- OLANZapine zydis (ZYPREXA) 5 MG disintegrating tablet; Take 1 tablet (5 mg total) by mouth 2 (two) times daily.  Dispense: 60 tablet; Refill: 2 Start- DULoxetine (CYMBALTA) 20 MG capsule; Take 1 capsule (20 mg total) by mouth daily.  Dispense: 30 capsule; Refill: 2  2. GAD (generalized anxiety disorder)  Continue- clonazePAM (KLONOPIN) 0.5 MG tablet; TAKE 1 TABLET(0.5 MG) BY MOUTH TWICE DAILY  Dispense: 60 tablet; Refill: 2 Continue- DULoxetine (CYMBALTA) 20 MG capsule; Take 1 capsule (20 mg total) by mouth daily.  Dispense: 30 capsule; Refill: 2  3. Tobacco use disorder  Continue- nicotine (NICODERM CQ - DOSED IN MG/24 HOURS) 21 mg/24hr patch; Place 1 patch (21 mg total) onto the skin daily.  Dispense: 28 patch; Refill: 0  4. Marijuana use    Follow up in 2 months Follow up with therapy   Shanna Cisco, NP 04/09/2021, 5:09 PM

## 2021-04-16 ENCOUNTER — Other Ambulatory Visit: Payer: Self-pay | Admitting: Family

## 2021-04-16 DIAGNOSIS — J449 Chronic obstructive pulmonary disease, unspecified: Secondary | ICD-10-CM

## 2021-04-17 ENCOUNTER — Other Ambulatory Visit: Payer: Self-pay | Admitting: Family

## 2021-04-17 DIAGNOSIS — I10 Essential (primary) hypertension: Secondary | ICD-10-CM

## 2021-05-02 ENCOUNTER — Ambulatory Visit: Payer: Self-pay | Admitting: Diagnostic Neuroimaging

## 2021-05-07 ENCOUNTER — Ambulatory Visit: Payer: Self-pay | Admitting: Diagnostic Neuroimaging

## 2021-05-07 ENCOUNTER — Other Ambulatory Visit: Payer: Self-pay

## 2021-05-07 ENCOUNTER — Encounter: Payer: Self-pay | Admitting: Diagnostic Neuroimaging

## 2021-05-07 VITALS — BP 122/82 | HR 88 | Ht 62.0 in | Wt 172.0 lb

## 2021-05-07 DIAGNOSIS — G9341 Metabolic encephalopathy: Secondary | ICD-10-CM

## 2021-05-07 NOTE — Progress Notes (Signed)
[facility]  PATIENT: Michaela Wells DOB: Jan 02, 1961  REFERRING CLINICIAN: [name], NP HISTORY FROM: patient  REASON FOR VISIT: new consult    HISTORICAL  CHIEF COMPLAINT:  Chief Complaint  Patient presents with   New Patient (Initial Visit)    Rm 6 alone here to f/u from ER visit in July. Pt reports MRI was completed and out pt neuro consult was recommended based of the imaging.     HISTORY OF PRESENT ILLNESS:   60 year old female here for evaluation of transient confusion.  History of PTSD, bipolar disorder, COPD.  Patient admitted on July 4 for acute kidney injury and hypokalemia.  She returned to the hospital on 03/04/2021 for fatigue, malaise, hallucinations and confusion.  She had been having poor p.o. intake due to paranoia about being poisoned.  Patient was admitted for work-up.  Depakote level was elevated.  Renal and liver function abnormalities were noted.  Patient was managed medically and symptoms gradually improved.  Today patient feels well.  She still has some hallucinations.  Still having some insomnia.  Overall improved from hospital course.     REVIEW OF SYSTEMS: Full 14 system review of systems performed and negative with exception of: As per HPI.  ALLERGIES: Allergies  Allergen Reactions   Penicillins Other (See Comments)    Childhood allergy- Reaction?? Has patient had a PCN reaction causing immediate rash, facial/tongue/throat swelling, SOB or lightheadedness with hypotension: Unknown Has patient had a PCN reaction causing severe rash involving mucus membranes or skin necrosis: Unknown Has patient had a PCN reaction that required hospitalization: Unknown Has patient had a PCN reaction occurring within the last 10 years: Unknown If all of the above answers are "NO", then may proceed with Cephalosporin use.      HOME MEDICATIONS: Outpatient Medications Prior to Visit  Medication Sig Dispense Refill   albuterol  (VENTOLIN HFA) 108 (90 Base) MCG/ACT inhaler INHALE 1 PUFF INTO THE LUNGS EVERY 4 HOURS AS NEEDED FOR WHEEZING OR SHORTNESS OF BREATH 8.5 g 0   ANORO ELLIPTA 62.5-25 MCG/INH AEPB INHALE 1 PUFF INTO THE LUNGS DAILY 60 each 0   benztropine (COGENTIN) 1 MG tablet TAKE 1 TABLET(1 MG) BY MOUTH TWICE DAILY 60 tablet 2   clonazePAM (KLONOPIN) 0.5 MG tablet TAKE 1 TABLET(0.5 MG) BY MOUTH TWICE DAILY 60 tablet 2   DULoxetine (CYMBALTA) 20 MG capsule Take 1 capsule (20 mg total) by mouth daily. 30 capsule 2   fluticasone (FLONASE) 50 MCG/ACT nasal spray Place 2 sprays into both nostrils daily.     metoprolol tartrate (LOPRESSOR) 25 MG tablet Take 0.5 tablets (12.5 mg total) by mouth daily. 30 tablet 2   nicotine (NICODERM CQ - DOSED IN MG/24 HOURS) 21 mg/24hr patch Place 1 patch (21 mg total) onto the skin daily. 28 patch 0   OLANZapine zydis (ZYPREXA) 5 MG disintegrating tablet Take 1 tablet (5 mg total) by mouth 2 (two) times daily. 60 tablet 2   No facility-administered medications prior to visit.    PAST MEDICAL HISTORY: Past Medical History:  Diagnosis Date   Bronchial asthma    Chicken pox    COPD (chronic obstructive pulmonary disease) (HCC)    Depression    Frequent headaches    GERD (gastroesophageal reflux disease)    UTI (lower urinary tract infection)     PAST SURGICAL HISTORY: Past Surgical History:  Procedure Laterality Date   ESOPHAGEAL DILATION     TONSILLECTOMY      FAMILY  HISTORY: Family History  Problem Relation Age of Onset   Other Mother        MVA-Deceased [pt was age 33]   COPD Father        Deceased   Heart attack Maternal Grandmother    Stroke Maternal Grandmother    Brain cancer Maternal Grandmother    Arthritis/Rheumatoid Maternal Grandmother    Heart attack Maternal Grandfather    Stroke Maternal Grandfather    Heart disease Maternal Aunt    Stroke Maternal Aunt        #1   Heart attack Maternal Aunt        #1   Hypertension Maternal Aunt        #1    Arthritis/Rheumatoid Sister    COPD Sister    Heart disease Maternal Uncle    Heart attack Maternal Uncle        x4   Heart disease Brother        #1   Heart attack Brother        #1   Diabetes Brother    Diabetes Maternal Aunt    Mental retardation Maternal Aunt    Healthy Daughter        x2   Drug abuse Daughter        #1   Drug abuse Daughter        #2   Kidney Stones Daughter        #2   Other Son        Agoraphobia    SOCIAL HISTORY: Social History   Socioeconomic History   Marital status: Single    Spouse name: Not on file   Number of children: Not on file   Years of education: Not on file   Highest education level: Not on file  Occupational History   Not on file  Tobacco Use   Smoking status: Every Day   Smokeless tobacco: Never  Substance and Sexual Activity   Alcohol use: Yes   Drug use: Not Currently   Sexual activity: Not Currently  Other Topics Concern   Not on file  Social History Narrative   Right handed    Caffeine- 12 pack of mountain dew    Lives at home with 2 daughters and 3 grandkids   Social Determinants of Health   Financial Resource Strain: Not on file  Food Insecurity: Not on file  Transportation Needs: Not on file  Physical Activity: Not on file  Stress: Not on file  Social Connections: Not on file  Intimate Partner Violence: Not on file     PHYSICAL EXAM  GENERAL EXAM/CONSTITUTIONAL: Vitals:  Vitals:   05/07/21 1253  BP: 122/82  Pulse: 88  SpO2: 97%  Weight: 172 lb (78 kg)  Height: 5\' 2"  (1.575 m)   Body mass index is 31.46 kg/m. Wt Readings from Last 3 Encounters:  05/07/21 172 lb (78 kg)  03/27/21 171 lb 6.4 oz (77.7 kg)  03/12/21 168 lb 6.9 oz (76.4 kg)   Patient is in no distress; well developed, nourished and groomed; neck is supple  CARDIOVASCULAR: Examination of carotid arteries is normal; no carotid bruits Regular rate and rhythm, no murmurs Examination of peripheral vascular system by  observation and palpation is normal  EYES: Ophthalmoscopic exam of optic discs and posterior segments is normal; no papilledema or hemorrhages No results found.  MUSCULOSKELETAL: Gait, strength, tone, movements noted in Neurologic exam below  NEUROLOGIC: MENTAL STATUS:  No flowsheet data found. awake, alert,  oriented to person, place and time recent and remote memory intact normal attention and concentration language fluent, comprehension intact, naming intact fund of knowledge appropriate  CRANIAL NERVE:  2nd - no papilledema on fundoscopic exam 2nd, 3rd, 4th, 6th - pupils equal and reactive to light, visual fields full to confrontation, extraocular muscles intact, no nystagmus 5th - facial sensation symmetric 7th - facial strength symmetric 8th - hearing intact 9th - palate elevates symmetrically, uvula midline 11th - shoulder shrug symmetric 12th - tongue protrusion midline  MOTOR:  normal bulk and tone, full strength in the BUE, BLE  SENSORY:  normal and symmetric to light touch,, temperature, vibration  COORDINATION:  finger-nose-finger, fine finger movements normal  REFLEXES:  deep tendon reflexes present and symmetric  GAIT/STATION:  narrow based gait     DIAGNOSTIC DATA (LABS, IMAGING, TESTING) - I reviewed patient records, labs, notes, testing and imaging myself where available.  Lab Results  Component Value Date   WBC 5.1 03/27/2021   HGB 12.5 03/27/2021   HCT 37.5 03/27/2021   MCV 91 03/27/2021   PLT 362 03/27/2021      Component Value Date/Time   NA 140 03/27/2021 0000   K 4.9 03/27/2021 0000   CL 108 (H) 03/27/2021 0000   CO2 20 03/27/2021 0000   GLUCOSE 80 03/27/2021 0000   GLUCOSE 90 03/11/2021 0718   BUN 8 03/27/2021 0000   CREATININE 0.85 03/27/2021 0000   CALCIUM 8.6 (L) 03/27/2021 0000   PROT 6.0 (L) 03/11/2021 0718   ALBUMIN 2.9 (L) 03/11/2021 0718   AST 16 03/11/2021 0718   ALT 18 03/11/2021 0718   ALKPHOS 53 03/11/2021 0718    BILITOT 0.5 03/11/2021 0718   GFRNONAA 55 (L) 03/11/2021 0718   GFRAA >60 04/04/2020 1630   Lab Results  Component Value Date   CHOL 203 (H) 05/06/2018   HDL 51 05/06/2018   LDLCALC 137 (H) 05/06/2018   TRIG 73 05/06/2018   CHOLHDL 4.0 05/06/2018   Lab Results  Component Value Date   HGBA1C 6.7 (H) 05/06/2018   Lab Results  Component Value Date   VITAMINB12 532 02/27/2021   Lab Results  Component Value Date   TSH 3.501 02/27/2021    03/07/2021 MRI brain with and without [I reviewed images myself and agree with interpretation. -VRP]  1. Motion degraded exam. 2. No acute intracranial abnormality. 3. Mild chronic microvascular ischemic disease for age. 4. Left mastoid effusion, of uncertain significance. Correlation with physical exam suggested.  03/07/2021 EEG - normal    ASSESSMENT AND PLAN  60 y.o. year old female here with transient confusion, hallucinations, malaise and fatigue in July 2022.   Dx:  1. Acute metabolic encephalopathy       PLAN:  Metabolic encephalopathy (improved; related to elevated depakote level, renal and liver abnl, polypharmacy, schizoaffective d/o, bipolar d/o, decreased PO intake, insomnia) - symptoms improved; follow up with PCP and psychiatry  Return for return to PCP.    Suanne Marker, MD 05/07/2021, 1:45 PM Certified in Neurology, Neurophysiology and Neuroimaging  [facility] [address] [place], [place] [address] [phone]

## 2021-05-07 NOTE — Patient Instructions (Signed)
-   symptoms improved; follow up with PCP and psychiatry  - start gradual exercise program (consider PT vs [employer])

## 2021-05-15 ENCOUNTER — Ambulatory Visit: Payer: Medicaid Other | Admitting: Family

## 2021-05-16 ENCOUNTER — Other Ambulatory Visit: Payer: Self-pay | Admitting: Family

## 2021-05-16 DIAGNOSIS — J449 Chronic obstructive pulmonary disease, unspecified: Secondary | ICD-10-CM

## 2021-05-17 ENCOUNTER — Other Ambulatory Visit: Payer: Self-pay | Admitting: Family

## 2021-05-17 DIAGNOSIS — J449 Chronic obstructive pulmonary disease, unspecified: Secondary | ICD-10-CM

## 2021-05-17 NOTE — Progress Notes (Signed)
Erroneous encounter

## 2021-05-21 ENCOUNTER — Encounter: Payer: Medicaid Other | Admitting: Family

## 2021-05-21 DIAGNOSIS — I10 Essential (primary) hypertension: Secondary | ICD-10-CM

## 2021-05-21 NOTE — Progress Notes (Signed)
Virtual Visit via Telephone Note  I connected with Michaela Wells, on 05/25/2021 at 10:12 AM by telephone due to the COVID-19 pandemic and verified that I am speaking with the correct person using two identifiers.  Due to current restrictions/limitations of in-office visits due to the COVID-19 pandemic, this scheduled clinical appointment was converted to a telehealth visit.   Consent: I discussed the limitations, risks, security and privacy concerns of performing an evaluation and management service by telephone and the availability of in person appointments. I also discussed with the patient that there may be a patient responsible charge related to this service. The patient expressed understanding and agreed to proceed.   Location of Patient: Home  Location of Provider: [facility] at [facility]   Persons participating in Telemedicine visit: Michaela Wells Ricky Stabs, NP Margorie John, CMA   History of Present Illness: Michaela Wells. Michaela Wells is a 60 year-old female who presents for hypertension follow-up.  HYPERTENSION FOLLOW-UP: 03/27/2021: - Continue Metoprolol Tartrate as prescribed.   05/25/2021: Currently taking: see medication list Med Adherence: []  Yes    [x]  No, has been without medications but cannot recall how long Medication side effects: []  Yes    [x]  No Adherence with salt restriction (low-salt diet): [x]  Yes  []  No Exercise: Yes []  No [x]  Home Monitoring?: [x]  Yes  Monitoring Frequency: []  Yes    [x]  No Home BP results range: []  Yes    [x]  No, checking blood pressures at home but does not remember exactly what they are Smoking: 1 pack daily but if anxious then smoking more than 1 pack daily  SOB? Has COPD, reports has not seen them/no appointment scheduled Chest Pain?: []  Yes    [x]  No    Past Medical History:  Diagnosis Date   Bronchial asthma    Chicken pox    COPD (chronic obstructive pulmonary disease) (HCC)    Depression    Frequent  headaches    GERD (gastroesophageal reflux disease)    UTI (lower urinary tract infection)    Allergies  Allergen Reactions   Penicillins Other (See Comments)    Childhood allergy- Reaction?? Has patient had a PCN reaction causing immediate rash, facial/tongue/throat swelling, SOB or lightheadedness with hypotension: Unknown Has patient had a PCN reaction causing severe rash involving mucus membranes or skin necrosis: Unknown Has patient had a PCN reaction that required hospitalization: Unknown Has patient had a PCN reaction occurring within the last 10 years: Unknown If all of the above answers are "NO", then may proceed with Cephalosporin use.      Current Outpatient Medications on File Prior to Visit  Medication Sig Dispense Refill   albuterol (VENTOLIN HFA) 108 (90 Base) MCG/ACT inhaler INHALE 1 PUFF INTO THE LUNGS EVERY 4 HOURS AS NEEDED FOR WHEEZING OR SHORTNESS OF BREATH 8.5 g 0   ANORO ELLIPTA 62.5-25 MCG/INH AEPB INHALE 1 PUFF INTO THE LUNGS DAILY 60 each 0   benztropine (COGENTIN) 1 MG tablet TAKE 1 TABLET(1 MG) BY MOUTH TWICE DAILY 60 tablet 2   clonazePAM (KLONOPIN) 0.5 MG tablet TAKE 1 TABLET(0.5 MG) BY MOUTH TWICE DAILY 60 tablet 2   DULoxetine (CYMBALTA) 20 MG capsule Take 1 capsule (20 mg total) by mouth daily. 30 capsule 2   fluticasone (FLONASE) 50 MCG/ACT nasal spray Place 2 sprays into both nostrils daily.     metoprolol tartrate (LOPRESSOR) 25 MG tablet Take 0.5 tablets (12.5 mg total) by mouth daily. 30 tablet 2  nicotine (NICODERM CQ - DOSED IN MG/24 HOURS) 21 mg/24hr patch Place 1 patch (21 mg total) onto the skin daily. 28 patch 0   OLANZapine zydis (ZYPREXA) 5 MG disintegrating tablet Take 1 tablet (5 mg total) by mouth 2 (two) times daily. 60 tablet 2   [DISCONTINUED] carvedilol (COREG) 25 MG tablet Take 1 tablet (25 mg total) by mouth 2 (two) times daily with a meal. (Patient not taking: Reported on 02/26/2021) 60 tablet 2   [DISCONTINUED] losartan (COZAAR) 50  MG tablet Take 1 tablet (50 mg total) by mouth daily. (Patient not taking: Reported on 02/26/2021) 90 tablet 1   [DISCONTINUED] omega-3 acid ethyl esters (LOVAZA) 1 g capsule Take 1 capsule (1 g total) by mouth 2 (two) times daily. (Patient not taking: Reported on 02/26/2021) 60 capsule 5   No current facility-administered medications on file prior to visit.    Observations/Objective: Alert and oriented x 3. Not in acute distress. Physical examination not completed as this is a telemedicine visit.  Assessment and Plan: 1. Essential hypertension: - Continue Metoprolol Tartrate as prescribed.  - Counseled on blood pressure goal of less than 130/80, low-sodium, DASH diet, medication compliance, 150 minutes of moderate intensity exercise per week as tolerated. Discussed medication compliance, adverse effects. - Follow-up with primary provider in 4 weeks or sooner if needed. - metoprolol tartrate (LOPRESSOR) 25 MG tablet; Take 0.5 tablets (12.5 mg total) by mouth daily.  Dispense: 45 tablet; Refill: 0  2. Chronic obstructive pulmonary disease, unspecified COPD type (HCC): - Continue Albuterol inhaler as prescribed.  - Referral to Pulmonology for further evaluation and management.  - Ambulatory referral to Pulmonology - albuterol (VENTOLIN HFA) 108 (90 Base) MCG/ACT inhaler; INHALE 1 PUFF INTO THE LUNGS EVERY 4 HOURS AS NEEDED FOR WHEEZING OR SHORTNESS OF BREATH  Dispense: 8.5 g; Refill: 1   Follow Up Instructions: Follow-up with primary provider in 4 weeks or sooner if needed.    Patient was given clear instructions to go to Emergency Department or return to medical center if symptoms don't improve, worsen, or new problems develop.The patient verbalized understanding.  I discussed the assessment and treatment plan with the patient. The patient was provided an opportunity to ask questions and all were answered. The patient agreed with the plan and demonstrated an understanding of the  instructions.   The patient was advised to call back or seek an in-person evaluation if the symptoms worsen or if the condition fails to improve as anticipated.   I provided 10 minutes total of non-face-to-face time during this encounter.   [name], NP  [facility] [place], [place] [phone] 05/25/2021, 10:12 AM

## 2021-05-25 ENCOUNTER — Other Ambulatory Visit: Payer: Self-pay

## 2021-05-25 ENCOUNTER — Ambulatory Visit (INDEPENDENT_AMBULATORY_CARE_PROVIDER_SITE_OTHER): Payer: Medicaid Other | Admitting: Family

## 2021-05-25 ENCOUNTER — Encounter: Payer: Self-pay | Admitting: Family

## 2021-05-25 DIAGNOSIS — I10 Essential (primary) hypertension: Secondary | ICD-10-CM

## 2021-05-25 DIAGNOSIS — J449 Chronic obstructive pulmonary disease, unspecified: Secondary | ICD-10-CM

## 2021-05-25 MED ORDER — METOPROLOL TARTRATE 25 MG PO TABS: 12.5000 mg | ORAL_TABLET | Freq: Every day | ORAL | 0 refills | Status: DC

## 2021-05-25 MED ORDER — ALBUTEROL SULFATE HFA 108 (90 BASE) MCG/ACT IN AERS: INHALATION_SPRAY | RESPIRATORY_TRACT | 1 refills | Status: DC

## 2021-05-25 NOTE — Progress Notes (Signed)
Pt presents for telemedicine visit for hypertension follow-up, pt need refill on albuterol inhaler

## 2021-06-24 ENCOUNTER — Other Ambulatory Visit: Payer: Self-pay | Admitting: Family

## 2021-06-24 DIAGNOSIS — J449 Chronic obstructive pulmonary disease, unspecified: Secondary | ICD-10-CM

## 2021-06-25 ENCOUNTER — Ambulatory Visit: Payer: Medicaid Other | Admitting: Family

## 2021-06-26 ENCOUNTER — Other Ambulatory Visit: Payer: Self-pay | Admitting: Family

## 2021-06-26 DIAGNOSIS — J449 Chronic obstructive pulmonary disease, unspecified: Secondary | ICD-10-CM

## 2021-06-30 NOTE — Progress Notes (Deleted)
Patient ID: Michaela Wells, female    DOB: [birth year]  MRN: [id number]  CC: Hypertension Follow-Up   Subjective: Michaela Wells is a 60 y.o. female who presents for hypertension follow-up.   Her concerns today include:   HYPERTENSION FOLLOW-UP: 05/25/2021: - Continue Metoprolol Tartrate as prescribed.   07/03/2021:  Patient Active Problem List   Diagnosis Date Noted   Tobacco use disorder 04/09/2021   Marijuana use 04/09/2021   Dehydration 03/04/2021   Transaminitis 03/04/2021   Auditory hallucination 03/04/2021   Visual hallucinations [id number]   Acute metabolic encephalopathy [id number]   AKI (acute kidney injury) ([place]) 02/26/2021   Hypokalemia 02/26/2021   Schizoaffective disorder, bipolar type ([place]) 06/14/2020   Reaction to severe stress, unspecified 10/29/2019   Psychosis ([place]) 10/23/2019   Alcohol dependence, uncomplicated ([place]) [id number]   Nicotine dependence, unspecified, uncomplicated [id number]   PTSD (post-traumatic stress disorder) 05/06/2018   Bipolar I disorder, current or most recent episode depressed, with psychotic features ([place]) 05/05/2018   Depression 11/07/2014   Marihuana abuse 11/07/2014   Anxiety 11/07/2014   COPD (chronic obstructive pulmonary disease) ([place]) 10/09/2013   GAD (generalized anxiety disorder) 10/09/2013   Smoking trying to quit 10/09/2013   GERD (gastroesophageal reflux disease) 10/09/2013     Current Outpatient Medications on File Prior to Visit  Medication Sig Dispense Refill   albuterol (VENTOLIN HFA) 108 (90 Base) MCG/ACT inhaler INHALE 1 PUFF INTO THE LUNGS EVERY 4 HOURS AS NEEDED FOR WHEEZING OR SHORTNESS OF BREATH 8.5 g 1   ANORO ELLIPTA 62.5-25 MCG/ACT AEPB INHALE 1 PUFF INTO THE LUNGS DAILY 60 each 0   benztropine (COGENTIN) 1 MG tablet TAKE 1 TABLET(1 MG) BY MOUTH TWICE DAILY 60 tablet 2   clonazePAM (KLONOPIN) 0.5 MG tablet TAKE 1 TABLET(0.5 MG) BY MOUTH TWICE DAILY 60 tablet 2   DULoxetine (CYMBALTA) 20 MG capsule  Take 1 capsule (20 mg total) by mouth daily. 30 capsule 2   fluticasone (FLONASE) 50 MCG/ACT nasal spray Place 2 sprays into both nostrils daily.     metoprolol tartrate (LOPRESSOR) 25 MG tablet Take 0.5 tablets (12.5 mg total) by mouth daily. 45 tablet 0   nicotine (NICODERM CQ - DOSED IN MG/24 HOURS) 21 mg/24hr patch Place 1 patch (21 mg total) onto the skin daily. 28 patch 0   OLANZapine zydis (ZYPREXA) 5 MG disintegrating tablet Take 1 tablet (5 mg total) by mouth 2 (two) times daily. 60 tablet 2   [DISCONTINUED] carvedilol (COREG) 25 MG tablet Take 1 tablet (25 mg total) by mouth 2 (two) times daily with a meal. (Patient not taking: Reported on 02/26/2021) 60 tablet 2   [DISCONTINUED] losartan (COZAAR) 50 MG tablet Take 1 tablet (50 mg total) by mouth daily. (Patient not taking: Reported on 02/26/2021) 90 tablet 1   [DISCONTINUED] omega-3 acid ethyl esters (LOVAZA) 1 g capsule Take 1 capsule (1 g total) by mouth 2 (two) times daily. (Patient not taking: Reported on 02/26/2021) 60 capsule 5   No current facility-administered medications on file prior to visit.    Allergies  Allergen Reactions   Penicillins Other (See Comments)    Childhood allergy- Reaction?? Has patient had a PCN reaction causing immediate rash, facial/tongue/throat swelling, SOB or lightheadedness with hypotension: Unknown Has patient had a PCN reaction causing severe rash involving mucus membranes or skin necrosis: Unknown Has patient had a PCN reaction that required hospitalization: Unknown Has patient had a PCN reaction occurring within the last 10 years: Unknown If all of the  above answers are "NO", then may proceed with Cephalosporin use.      Social History   Socioeconomic History   Marital status: Single    Spouse name: Not on file   Number of children: Not on file   Years of education: Not on file   Highest education level: Not on file  Occupational History   Not on file  Tobacco Use   Smoking status: Every  Day   Smokeless tobacco: Never  Substance and Sexual Activity   Alcohol use: Yes   Drug use: Not Currently   Sexual activity: Not Currently  Other Topics Concern   Not on file  Social History Narrative   Right handed    Caffeine- 12 pack of mountain dew    Lives at home with 2 daughters and 3 grandkids   Social Determinants of Health   Financial Resource Strain: Not on file  Food Insecurity: Not on file  Transportation Needs: Not on file  Physical Activity: Not on file  Stress: Not on file  Social Connections: Not on file  Intimate Partner Violence: Not on file    Family History  Problem Relation Age of Onset   Other Mother        MVA-Deceased [place] Nephew was age 60]   COPD Father        Deceased   Heart attack Maternal Grandmother    Stroke Maternal Grandmother    Brain cancer Maternal Grandmother    Arthritis/Rheumatoid Maternal Grandmother    Heart attack Maternal Grandfather    Stroke Maternal Grandfather    Heart disease Maternal Aunt    Stroke Maternal Aunt        #1   Heart attack Maternal Aunt        #1   Hypertension Maternal Aunt        #1   Arthritis/Rheumatoid Sister    COPD Sister    Heart disease Maternal Uncle    Heart attack Maternal Uncle        x4   Heart disease Brother        #1   Heart attack Brother        #1   Diabetes Brother    Diabetes Maternal Aunt    Mental retardation Maternal Aunt    Healthy Daughter        x2   Drug abuse Daughter        #1   Drug abuse Daughter        #2   Kidney Stones Daughter        #2   Other Son        Agoraphobia    Past Surgical History:  Procedure Laterality Date   ESOPHAGEAL DILATION     TONSILLECTOMY      ROS: Review of Systems Negative except as stated above  PHYSICAL EXAM: There were no vitals taken for this visit.  Physical Exam  {female adult master:310786} {female adult master:310785}  CMP Latest Ref Rng & Units 03/27/2021 03/11/2021 03/09/2021  Glucose 65 - 99 mg/dL 80 90 053(Z)   BUN 8 - 27 mg/dL 8 13 12   Creatinine 0.57 - 1.00 mg/dL 7.67) 3.41(P)  Sodium 134 - 144 mmol/L 140 140 140  Potassium 3.5 - 5.2 mmol/L 4.9 4.1 3.7  Chloride 96 - 106 mmol/L 108(H) 104 102  CO2 20 - 29 mmol/L 20 28 30   Calcium 8.7 - 10.3 mg/dL 3.79(K) 8.1(L) 8.1(L)  Total Protein 6.5 - 8.1 g/dL - 6.0(L)  6.2(L)  Total Bilirubin 0.3 - 1.2 mg/dL - 0.5 0.5  Alkaline Phos 38 - 126 U/L - 53 57  AST 15 - 41 U/L - 16 21  ALT 0 - 44 U/L - 18 21   Lipid Panel     Component Value Date/Time   CHOL 203 (H) 05/06/2018 0627   TRIG 73 05/06/2018 0627   HDL 51 05/06/2018 0627   CHOLHDL 4.0 05/06/2018 0627   VLDL 15 05/06/2018 0627   LDLCALC 137 (H) 05/06/2018 0627    CBC    Component Value Date/Time   WBC 5.1 03/27/2021 0000   WBC 6.7 03/11/2021 0718   RBC 4.13 03/27/2021 0000   RBC 4.26 03/11/2021 0718   HGB 12.5 03/27/2021 0000   HCT 37.5 03/27/2021 0000   PLT 362 03/27/2021 0000   MCV 91 03/27/2021 0000   MCH 30.3 03/27/2021 0000   MCH 30.0 03/11/2021 0718   MCHC 33.3 03/27/2021 0000   MCHC 32.3 03/11/2021 0718   RDW 15.2 03/27/2021 0000   LYMPHSABS 1.2 03/04/2021 1410   MONOABS 1.0 03/04/2021 1410   EOSABS 0.0 03/04/2021 1410   BASOSABS 0.0 03/04/2021 1410    ASSESSMENT AND PLAN:  There are no diagnoses linked to this encounter.   Patient was given the opportunity to ask questions.  Patient verbalized understanding of the plan and was able to repeat key elements of the plan. Patient was given clear instructions to go to Emergency Department or return to medical center if symptoms don't improve, worsen, or new problems develop.The patient verbalized understanding.   No orders of the defined types were placed in this encounter.    Requested Prescriptions    No prescriptions requested or ordered in this encounter    No follow-ups on file.  Camillia Herter, NP

## 2021-07-03 ENCOUNTER — Ambulatory Visit: Payer: Medicaid Other | Admitting: Family

## 2021-07-03 DIAGNOSIS — I10 Essential (primary) hypertension: Secondary | ICD-10-CM

## 2021-07-08 NOTE — Progress Notes (Signed)
Patient ID: Michaela Wells, female    DOB: 1961/03/21  MRN: [id number]  CC: Hypertension Follow-Up   Subjective: Michaela Wells is a 60 y.o. female who presents for hypertension follow-up.   Her concerns today include:   HYPERTENSION FOLLOW-UP: 05/25/2021: - Continue Metoprolol Tartrate as prescribed.   07/13/2021: Reports home blood pressures are higher than normal. Denies chest pain, shortness of breath, and additional red flag symptoms.   2. RIGHT COLLARBONE PAIN: Ongoing for some time. Reports history of falls but no recent trauma/injury. Endorses pain and difficulty raising above head. Reports hearing a popping sound near right collarbone when rotating right shoulder. Taking Ibuprofen for pain management.  3. DANDRUFF: Requesting refills of Ketoconazole.  Patient Active Problem List   Diagnosis Date Noted   Tobacco use disorder 04/09/2021   Marijuana use 04/09/2021   Dehydration 03/04/2021   Transaminitis 03/04/2021   Auditory hallucination 03/04/2021   Visual hallucinations 03/04/2021   Acute metabolic encephalopathy 03/04/2021   AKI (acute kidney injury) (HCC) 02/26/2021   Hypokalemia 02/26/2021   Schizoaffective disorder, bipolar type (HCC) 06/14/2020   Reaction to severe stress, unspecified 10/29/2019   Psychosis (HCC) 10/23/2019   Alcohol dependence, uncomplicated (HCC) 05/14/2018   Nicotine dependence, unspecified, uncomplicated 05/14/2018   PTSD (post-traumatic stress disorder) 05/06/2018   Bipolar I disorder, current or most recent episode depressed, with psychotic features (HCC) 05/05/2018   Depression 11/07/2014   Marihuana abuse 11/07/2014   Anxiety 11/07/2014   COPD (chronic obstructive pulmonary disease) (HCC) 10/09/2013   GAD (generalized anxiety disorder) 10/09/2013   Smoking trying to quit 10/09/2013   GERD (gastroesophageal reflux disease) 10/09/2013     Current Outpatient Medications on File Prior to Visit  Medication Sig Dispense Refill    albuterol (VENTOLIN HFA) 108 (90 Base) MCG/ACT inhaler INHALE 1 PUFF INTO THE LUNGS EVERY 4 HOURS AS NEEDED FOR WHEEZING OR SHORTNESS OF BREATH 8.5 g 1   ANORO ELLIPTA 62.5-25 MCG/ACT AEPB INHALE 1 PUFF INTO THE LUNGS DAILY 60 each 0   benztropine (COGENTIN) 1 MG tablet TAKE 1 TABLET(1 MG) BY MOUTH TWICE DAILY 60 tablet 2   clonazePAM (KLONOPIN) 0.5 MG tablet TAKE 1 TABLET(0.5 MG) BY MOUTH TWICE DAILY 60 tablet 2   DULoxetine (CYMBALTA) 20 MG capsule Take 1 capsule (20 mg total) by mouth daily. 30 capsule 2   fluticasone (FLONASE) 50 MCG/ACT nasal spray Place 2 sprays into both nostrils daily.     metoprolol tartrate (LOPRESSOR) 25 MG tablet Take 0.5 tablets (12.5 mg total) by mouth daily. 45 tablet 0   nicotine (NICODERM CQ - DOSED IN MG/24 HOURS) 21 mg/24hr patch Place 1 patch (21 mg total) onto the skin daily. 28 patch 0   OLANZapine zydis (ZYPREXA) 5 MG disintegrating tablet Take 1 tablet (5 mg total) by mouth 2 (two) times daily. 60 tablet 2   [DISCONTINUED] carvedilol (COREG) 25 MG tablet Take 1 tablet (25 mg total) by mouth 2 (two) times daily with a meal. (Patient not taking: Reported on 02/26/2021) 60 tablet 2   [DISCONTINUED] omega-3 acid ethyl esters (LOVAZA) 1 g capsule Take 1 capsule (1 g total) by mouth 2 (two) times daily. (Patient not taking: Reported on 02/26/2021) 60 capsule 5   No current facility-administered medications on file prior to visit.    Allergies  Allergen Reactions   Penicillins Other (See Comments)    Childhood allergy- Reaction?? Has patient had a PCN reaction causing immediate rash, facial/tongue/throat swelling, SOB or lightheadedness with hypotension: Unknown Has  patient had a PCN reaction causing severe rash involving mucus membranes or skin necrosis: Unknown Has patient had a PCN reaction that required hospitalization: Unknown Has patient had a PCN reaction occurring within the last 10 years: Unknown If all of the above answers are "NO", then may proceed with  Cephalosporin use.      Social History   Socioeconomic History   Marital status: Single    Spouse name: Not on file   Number of children: Not on file   Years of education: Not on file   Highest education level: Not on file  Occupational History   Not on file  Tobacco Use   Smoking status: Every Day   Smokeless tobacco: Never  Substance and Sexual Activity   Alcohol use: Yes   Drug use: Not Currently   Sexual activity: Not Currently  Other Topics Concern   Not on file  Social History Narrative   Right handed    Caffeine- 12 pack of mountain dew    Lives at home with 2 daughters and 3 grandkids   Social Determinants of Health   Financial Resource Strain: Not on file  Food Insecurity: Not on file  Transportation Needs: Not on file  Physical Activity: Not on file  Stress: Not on file  Social Connections: Not on file  Intimate Partner Violence: Not on file    Family History  Problem Relation Age of Onset   Other Mother        MVA-Deceased [pt was age 43]   COPD Father        Deceased   Heart attack Maternal Grandmother    Stroke Maternal Grandmother    Brain cancer Maternal Grandmother    Arthritis/Rheumatoid Maternal Grandmother    Heart attack Maternal Grandfather    Stroke Maternal Grandfather    Heart disease Maternal Aunt    Stroke Maternal Aunt        #1   Heart attack Maternal Aunt        #1   Hypertension Maternal Aunt        #1   Arthritis/Rheumatoid Sister    COPD Sister    Heart disease Maternal Uncle    Heart attack Maternal Uncle        x4   Heart disease Brother        #1   Heart attack Brother        #1   Diabetes Brother    Diabetes Maternal Aunt    Mental retardation Maternal Aunt    Healthy Daughter        x2   Drug abuse Daughter        #1   Drug abuse Daughter        #2   Kidney Stones Daughter        #2   Other Son        Agoraphobia    Past Surgical History:  Procedure Laterality Date   ESOPHAGEAL DILATION      TONSILLECTOMY      ROS: Review of Systems Negative except as stated above  PHYSICAL EXAM: BP (!) 147/94 (BP Location: Left Arm, Patient Position: Sitting, Cuff Size: Large)   Pulse 73   Temp 98.5 F (36.9 C)   Resp 18   Ht 5' 2.01" (1.575 m)   Wt 168 lb 0.2 oz (76.2 kg)   SpO2 95%   BMI 30.72 kg/m   Physical Exam HENT:     Head: Normocephalic and atraumatic.  Eyes:     Extraocular Movements: Extraocular movements intact.     Conjunctiva/sclera: Conjunctivae normal.     Pupils: Pupils are equal, round, and reactive to light.  Cardiovascular:     Rate and Rhythm: Normal rate and regular rhythm.     Pulses: Normal pulses.     Heart sounds: Normal heart sounds.  Pulmonary:     Effort: Pulmonary effort is normal.     Breath sounds: Normal breath sounds.  Musculoskeletal:     Cervical back: Normal range of motion and neck supple.  Neurological:     General: No focal deficit present.     Mental Status: She is alert and oriented to person, place, and time.  Psychiatric:        Mood and Affect: Mood normal.        Behavior: Behavior normal.   Results for orders placed or performed in visit on 07/13/21  POCT glycosylated hemoglobin (Hb A1C)  Result Value Ref Range   Hemoglobin A1C 5.3 4.0 - 5.6 %   HbA1c POC (<> result, manual entry)     HbA1c, POC (prediabetic range)     HbA1c, POC (controlled diabetic range)      ASSESSMENT AND PLAN: 1. Essential hypertension: - Blood pressure not at goal during today's visit. Patient asymptomatic without chest pressure, chest pain, palpitations, shortness of breath, worst headache of life, and any additional red flag symptoms. - Continue Metoprolol Tartrate as prescribed. - Begin Losartan as prescribed.  - Counseled on blood pressure goal of less than 130/80, low-sodium, DASH diet, medication compliance, 150 minutes of moderate intensity exercise per week as tolerated. Discussed medication compliance, adverse effects. - Follow-up  with primary provider in 4 weeks or sooner if needed.  - losartan (COZAAR) 25 MG tablet; Take 1 tablet (25 mg total) by mouth daily.  Dispense: 30 tablet; Refill: 0 - metoprolol tartrate (LOPRESSOR) 25 MG tablet; Take 0.5 tablets (12.5 mg total) by mouth daily.  Dispense: 45 tablet; Refill: 0  2. Pain of right clavicle: - Chronic. - Diagnostic x-ray right clavicle for further evaluation.  - Referral to Orthopedic Surgery for further evaluation and management.  - DG Clavicle Right; Future - Ambulatory referral to Orthopedic Surgery  3. Dandruff: - Ketoconazole as prescribed.  - Follow-up with primary provider as scheduled. - KETOCONAZOLE, TOPICAL, 1 % SHAM; Apply 1 application topically once a week for 8 doses.  Dispense: 200 mL; Refill: 1  4. Screening for diabetes mellitus: - Hemoglobin A1c today 5.3%. No medication management needed as of present.  - Encouraged patient to recheck in 3 to 6 months or sooner if needed with primary provider. Patient verbalized understanding. - POCT glycosylated hemoglobin (Hb A1C)    Patient was given the opportunity to ask questions.  Patient verbalized understanding of the plan and was able to repeat key elements of the plan. Patient was given clear instructions to go to Emergency Department or return to medical center if symptoms don't improve, worsen, or new problems develop.The patient verbalized understanding.   Orders Placed This Encounter  Procedures   DG Clavicle Right   Ambulatory referral to Orthopedic Surgery   POCT glycosylated hemoglobin (Hb A1C)     Requested Prescriptions   Signed Prescriptions Disp Refills   losartan (COZAAR) 25 MG tablet 30 tablet 0    Sig: Take 1 tablet (25 mg total) by mouth daily.   KETOCONAZOLE, TOPICAL, 1 % SHAM 200 mL 1    Sig: Apply 1 application topically once  a week for 8 doses.    Return in 2 weeks (on 07/27/2021) for Follow-Up or next available hypertension .  Camillia Herter, NP

## 2021-07-10 ENCOUNTER — Telehealth (INDEPENDENT_AMBULATORY_CARE_PROVIDER_SITE_OTHER): Payer: No Payment, Other | Admitting: Psychiatry

## 2021-07-10 ENCOUNTER — Encounter (HOSPITAL_COMMUNITY): Payer: Self-pay | Admitting: Psychiatry

## 2021-07-10 DIAGNOSIS — F411 Generalized anxiety disorder: Secondary | ICD-10-CM | POA: Diagnosis not present

## 2021-07-10 DIAGNOSIS — F250 Schizoaffective disorder, bipolar type: Secondary | ICD-10-CM | POA: Diagnosis not present

## 2021-07-10 DIAGNOSIS — F17200 Nicotine dependence, unspecified, uncomplicated: Secondary | ICD-10-CM | POA: Diagnosis not present

## 2021-07-10 MED ORDER — CLONAZEPAM 0.5 MG PO TABS: ORAL_TABLET | ORAL | 2 refills | Status: DC

## 2021-07-10 MED ORDER — NICOTINE 21 MG/24HR TD PT24: 21.0000 mg | MEDICATED_PATCH | Freq: Every day | TRANSDERMAL | 0 refills | Status: DC

## 2021-07-10 MED ORDER — BENZTROPINE MESYLATE 1 MG PO TABS: ORAL_TABLET | ORAL | 2 refills | Status: DC

## 2021-07-10 MED ORDER — OLANZAPINE 5 MG PO TBDP: 5.0000 mg | ORAL_TABLET | Freq: Two times a day (BID) | ORAL | 2 refills | Status: DC

## 2021-07-10 MED ORDER — DULOXETINE HCL 20 MG PO CPEP: 20.0000 mg | ORAL_CAPSULE | Freq: Every day | ORAL | 2 refills | Status: DC

## 2021-07-10 NOTE — Progress Notes (Signed)
BH MD/PA/NP OP Progress Note Virtual Visit via Telephone Note  I connected with Michaela Wells on 07/10/21 at  3:00 PM EST by telephone and verified that I am speaking with the correct person using two identifiers.  Location: Patient: home Provider: Clinic   I discussed the limitations, risks, security and privacy concerns of performing an evaluation and management service by telephone and the availability of in person appointments. I also discussed with the patient that there may be a patient responsible charge related to this service. The patient expressed understanding and agreed to proceed.   I provided 30 minutes of non-face-to-face time during this encounter.        07/10/2021 12:44 PM Michaela Wells  MRN:  [id number]  Chief Complaint: "Things are the same"  HPI: 60 year old female seen today for follow-up psychiatric evaluation.  She has a psychiatric history of schizoaffective disorder, bipolar disorder, anxiety, and depression. She is currently she is on Zyprexa 5 mg twice daily, Cogentin 1 mg twice daily, Cymbalta 20 mg daily, Klonopin 0.5 mg twice daily, NicoDerm CQ 21 mg patches daily.  Patient was recently seen on 05/07/2021 by neurology for transient confusion.  Per note patient was diagnosed with metabolic encephalopathy that was improving since the discontinuation of Depakote.  Today patient informed writer that her medications are somewhat effective in managing her psychiatric conditions.    Today she was pleasant, cooperative, and engaged in conversation.  She informed Clinical research associate that mentally she feels the same.  She notes that recently she was seen by neurology however notes that she did not fully understand what they were saying.  Provider informed patient that she could call her neurologist back for further information.  Provider did inform patient that per notes she could have been experiencing metabolic encephalopathy and briefly explained what that meant.  She  endorsed understanding however notes that she is frustrated that she does not see improvements of her conditions.  She notes that she continues to have hallucinations about her neighbors in [place].  She did Archivist that since her last visit things have gotten a little better and notes that her neighbors does not hurt her as much or cause as much intense pain.  She informed Clinical research associate that she is aware that her hallucinations are not real but reports that it saddens her that they are not improving provider informed patient that Cymbalta helps manage pain and it could be the medication that is affecting her pain level.  She endorsed understanding and agreed.   Patient notes that she is depressed and anxious most days.  Provider conducted a GAD-7 and patient scored a 21, at her last visit she scored a 21.  Provider also conducted a PHQ-9 and patient scored 21, at her last visit she scored 22.  She endorses adequate sleep and appetite.  Patient notes that she has lost approximately 10 pounds since her last visit through dieting.   At this time medications not adjusted due to medical condition which was noted to be improving.  Medications may be adjusted at next visit if needed. Geodon is a potential option instead of Zyprexa.  Patient was referred to outpatient counseling for therapy.  No other concerns noted at this time    Visit Diagnosis:    ICD-10-CM   1. Schizoaffective disorder, bipolar type (HCC)  F25.0 benztropine (COGENTIN) 1 MG tablet    DULoxetine (CYMBALTA) 20 MG capsule    OLANZapine zydis (ZYPREXA) 5 MG disintegrating tablet    2.  GAD (generalized anxiety disorder)  F41.1 clonazePAM (KLONOPIN) 0.5 MG tablet    DULoxetine (CYMBALTA) 20 MG capsule    3. Tobacco use disorder  F17.200 nicotine (NICODERM CQ - DOSED IN MG/24 HOURS) 21 mg/24hr patch       Past Psychiatric History:  schizoaffective disorder, bipolar disorder, anxiety, and depression  Past Medical History:  Past Medical  History:  Diagnosis Date   Bronchial asthma    Chicken pox    COPD (chronic obstructive pulmonary disease) (HCC)    Depression    Frequent headaches    GERD (gastroesophageal reflux disease)    UTI (lower urinary tract infection)     Past Surgical History:  Procedure Laterality Date   ESOPHAGEAL DILATION     TONSILLECTOMY      Family Psychiatric History:  Daughter Bipolar and SA, Daughter anxiety, Son anxiety  Family History:  Family History  Problem Relation Age of Onset   Other Mother        MVA-Deceased [pt was age 72]   COPD Father        Deceased   Heart attack Maternal Grandmother    Stroke Maternal Grandmother    Brain cancer Maternal Grandmother    Arthritis/Rheumatoid Maternal Grandmother    Heart attack Maternal Grandfather    Stroke Maternal Grandfather    Heart disease Maternal Aunt    Stroke Maternal Aunt        #1   Heart attack Maternal Aunt        #1   Hypertension Maternal Aunt        #1   Arthritis/Rheumatoid Sister    COPD Sister    Heart disease Maternal Uncle    Heart attack Maternal Uncle        x4   Heart disease Brother        #1   Heart attack Brother        #1   Diabetes Brother    Diabetes Maternal Aunt    Mental retardation Maternal Aunt    Healthy Daughter        x2   Drug abuse Daughter        #1   Drug abuse Daughter        #2   Kidney Stones Daughter        #2   Other Son        Agoraphobia    Social History:  Social History   Socioeconomic History   Marital status: Single    Spouse name: Not on file   Number of children: Not on file   Years of education: Not on file   Highest education level: Not on file  Occupational History   Not on file  Tobacco Use   Smoking status: Every Day   Smokeless tobacco: Never  Substance and Sexual Activity   Alcohol use: Yes   Drug use: Not Currently   Sexual activity: Not Currently  Other Topics Concern   Not on file  Social History Narrative   Right handed    Caffeine-  12 pack of mountain dew    Lives at home with 2 daughters and 3 grandkids   Social Determinants of Health   Financial Resource Strain: Not on file  Food Insecurity: Not on file  Transportation Needs: Not on file  Physical Activity: Not on file  Stress: Not on file  Social Connections: Not on file    Allergies:  Allergies  Allergen Reactions   Penicillins Other (See  Comments)    Childhood allergy- Reaction?? Has patient had a PCN reaction causing immediate rash, facial/tongue/throat swelling, SOB or lightheadedness with hypotension: Unknown Has patient had a PCN reaction causing severe rash involving mucus membranes or skin necrosis: Unknown Has patient had a PCN reaction that required hospitalization: Unknown Has patient had a PCN reaction occurring within the last 10 years: Unknown If all of the above answers are "NO", then may proceed with Cephalosporin use.      Metabolic Disorder Labs: Lab Results  Component Value Date   HGBA1C 6.7 (H) 05/06/2018   MPG 145.59 05/06/2018   No results found for: PROLACTIN Lab Results  Component Value Date   CHOL 203 (H) 05/06/2018   TRIG 73 05/06/2018   HDL 51 05/06/2018   CHOLHDL 4.0 05/06/2018   VLDL 15 05/06/2018   LDLCALC 137 (H) 05/06/2018   Lab Results  Component Value Date   TSH 3.501 02/27/2021   TSH 3.388 10/23/2019    Therapeutic Level Labs: No results found for: LITHIUM Lab Results  Component Value Date   VALPROATE 56 03/08/2021   VALPROATE 132 (H) 03/06/2021   No components found for:  CBMZ  Current Medications: Current Outpatient Medications  Medication Sig Dispense Refill   albuterol (VENTOLIN HFA) 108 (90 Base) MCG/ACT inhaler INHALE 1 PUFF INTO THE LUNGS EVERY 4 HOURS AS NEEDED FOR WHEEZING OR SHORTNESS OF BREATH 8.5 g 1   ANORO ELLIPTA 62.5-25 MCG/ACT AEPB INHALE 1 PUFF INTO THE LUNGS DAILY 60 each 0   benztropine (COGENTIN) 1 MG tablet TAKE 1 TABLET(1 MG) BY MOUTH TWICE DAILY 60 tablet 2   clonazePAM  (KLONOPIN) 0.5 MG tablet TAKE 1 TABLET(0.5 MG) BY MOUTH TWICE DAILY 60 tablet 2   DULoxetine (CYMBALTA) 20 MG capsule Take 1 capsule (20 mg total) by mouth daily. 30 capsule 2   fluticasone (FLONASE) 50 MCG/ACT nasal spray Place 2 sprays into both nostrils daily.     metoprolol tartrate (LOPRESSOR) 25 MG tablet Take 0.5 tablets (12.5 mg total) by mouth daily. 45 tablet 0   nicotine (NICODERM CQ - DOSED IN MG/24 HOURS) 21 mg/24hr patch Place 1 patch (21 mg total) onto the skin daily. 28 patch 0   OLANZapine zydis (ZYPREXA) 5 MG disintegrating tablet Take 1 tablet (5 mg total) by mouth 2 (two) times daily. 60 tablet 2   No current facility-administered medications for this visit.     Musculoskeletal: Strength & Muscle Tone:  Unable to assess due to telephone visit Gait & Station:  Unable to assess due to telephone  visit Patient leans: N/A  Psychiatric Specialty Exam: Review of Systems  There were no vitals taken for this visit.There is no height or weight on file to calculate BMI.  General Appearance:  Unable to assess due to telephone visit  Eye Contact:   Unable to assess due to telephone visit  Speech:  Clear and Coherent and Normal Rate  Volume:  Normal  Mood:  Anxious and Depressed  Affect:  Appropriate and Congruent  Thought Process:  Coherent, Goal Directed and Linear  Orientation:  Full (Time, Place, and Person)  Thought Content: Logical, Hallucinations: Auditory, Ideas of Reference:   Paranoia Delusions and Paranoid Ideation   Suicidal Thoughts:  No  Homicidal Thoughts:  No  Memory:  Immediate;   Good Recent;   Good Remote;   Good  Judgement:  Good  Insight:  Fair  Psychomotor Activity:  Normal  Concentration:  Concentration: Good and Attention Span: Good  Recall:  Good  Fund of Knowledge: Good  Language: Good  Akathisia:  No  Handed:  Right  AIMS (if indicated): Not done  Assets:  Communication Skills Desire for Improvement Financial  Resources/Insurance Housing Leisure Time Social Support  ADL's:  Intact  Cognition: WNL  Sleep:  Fair   Screenings: AIMS    Flowsheet Row [facility] (Dis[facility]  AIMS Total Score 0 0      AUDIT    Flowsheet Row Admission (Discharged) [facility]  Alcohol Use Disorder Identification Test Final Score (AUDIT) 5 4      GAD-7    Flowsheet Row Video Visit from 07/10/2021 in [facility] Video Visit from 04/09/2021 in [facility] Video Visit from 02/05/2021 in [facility] Video Visit from 11/08/2020 in [facility] Video Visit from 10/02/2020 in [facility]  Total GAD-7 Score 21 21 20 21 21       PHQ2-9    Flowsheet Row Video Visit from 07/10/2021 in [facility] Office Visit from 05/25/2021 in [facility] at [facility] Video Visit from 04/09/2021 in [facility] Erroneous Encounter from 03/27/2021 in Primary Care at [facility] Video Visit from 02/05/2021 in [facility]  PHQ-2 Total Score 5 0 5 0 6  PHQ-9 Total Score 21 0 22 0 21      Flowsheet Row Video Visit from 04/09/2021 in [facility] ED to Hosp-Admission (Discharged) from 03/04/2021 in [place] [facility] ED to Hosp-Admission (Discharged) from 02/26/2021 in [place] [facility] FLOOR PROGRESSIVE CARE AND UROLOGY  C-SSRS RISK CATEGORY Error: Q7 should not be populated when Q6 is No No Risk No Risk        Assessment and Plan: Patient endorses symptoms of anxiety,  depression, AH, and paranoia.  At this time medications not adjusted due to medical condition which was noted to be improving.  Medications may be adjusted at next visit if needed.  Geodon is a potential option instead of Zyprexa.  Patient was referred to outpatient counseling for therapy.  1. Schizoaffective disorder, bipolar type (HCC)  Continue- benztropine (COGENTIN) 1 MG tablet; TAKE 1 TABLET(1 MG) BY MOUTH TWICE DAILY  Dispense: 60 tablet; Refill: 2 Continue- DULoxetine (CYMBALTA) 20 MG capsule; Take 1 capsule (20 mg total) by mouth daily.  Dispense: 30 capsule; Refill: 2 Continue- OLANZapine zydis (ZYPREXA) 5 MG disintegrating tablet; Take 1 tablet (5 mg total) by mouth 2 (two) times daily.  Dispense: 60 tablet; Refill: 2  2. GAD (generalized anxiety disorder)  Continue- clonazePAM (KLONOPIN) 0.5 MG tablet; TAKE 1 TABLET(0.5 MG) BY MOUTH TWICE DAILY  Dispense: 60 tablet; Refill: 2 Continue- DULoxetine (CYMBALTA) 20 MG capsule; Take 1 capsule (20 mg total) by mouth daily.  Dispense: 30 capsule; Refill: 2  3. Tobacco use disorder  Continue- nicotine (NICODERM CQ - DOSED IN MG/24 HOURS) 21 mg/24hr patch; Place 1 patch (21 mg total) onto the skin daily.  Dispense: 28 patch; Refill: 0  Follow-up in 3 months Follow-up with   Follow up in 2 months Follow up with therapy   [place], NP 07/10/2021, 12:44 PM

## 2021-07-13 ENCOUNTER — Encounter: Payer: Self-pay | Admitting: Family

## 2021-07-13 ENCOUNTER — Ambulatory Visit: Payer: Self-pay | Admitting: Family

## 2021-07-13 ENCOUNTER — Other Ambulatory Visit: Payer: Self-pay

## 2021-07-13 ENCOUNTER — Ambulatory Visit (INDEPENDENT_AMBULATORY_CARE_PROVIDER_SITE_OTHER): Payer: Medicaid Other

## 2021-07-13 VITALS — BP 147/94 | HR 73 | Temp 98.5°F | Resp 18 | Ht 62.01 in | Wt 168.0 lb

## 2021-07-13 DIAGNOSIS — M898X1 Other specified disorders of bone, shoulder: Secondary | ICD-10-CM

## 2021-07-13 DIAGNOSIS — Z131 Encounter for screening for diabetes mellitus: Secondary | ICD-10-CM

## 2021-07-13 DIAGNOSIS — I10 Essential (primary) hypertension: Secondary | ICD-10-CM

## 2021-07-13 DIAGNOSIS — L210 Seborrhea capitis: Secondary | ICD-10-CM

## 2021-07-13 LAB — POCT GLYCOSYLATED HEMOGLOBIN (HGB A1C): Hemoglobin A1C: 5.3 % (ref 4.0–5.6)

## 2021-07-13 IMAGING — DX DG CLAVICLE*R*
2 series · 2 of 2 positions shown · non-contrast
Comparison: None.

CLINICAL DATA: Chronic right clavicle pain.

EXAM:
RIGHT CLAVICLE - 2+ VIEWS

[clavicle ap]
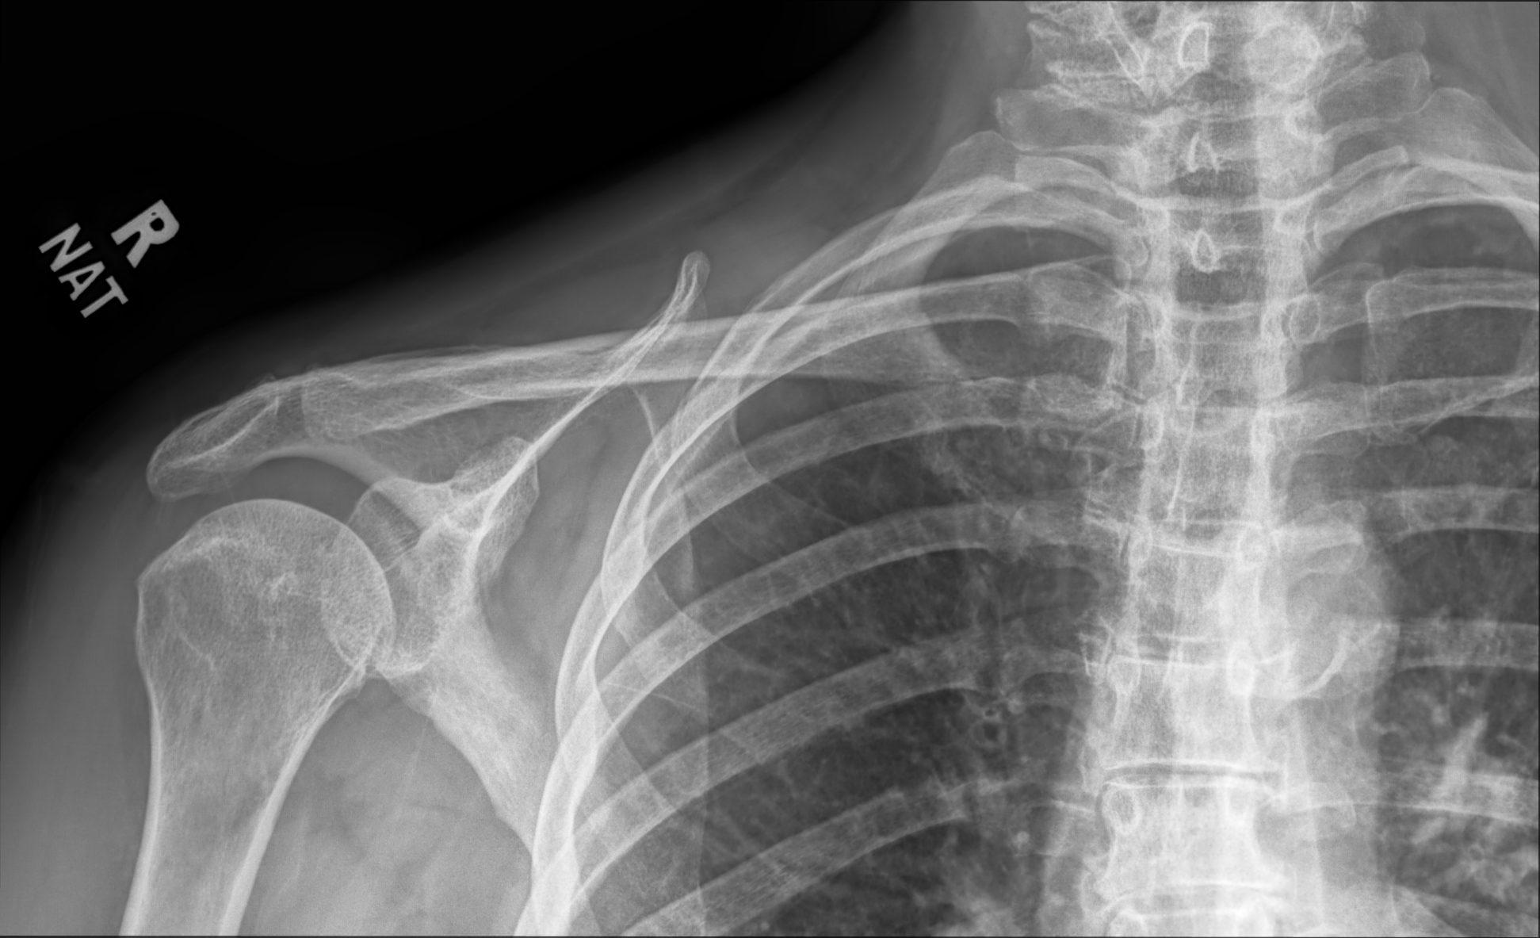

[clavicle tangential]
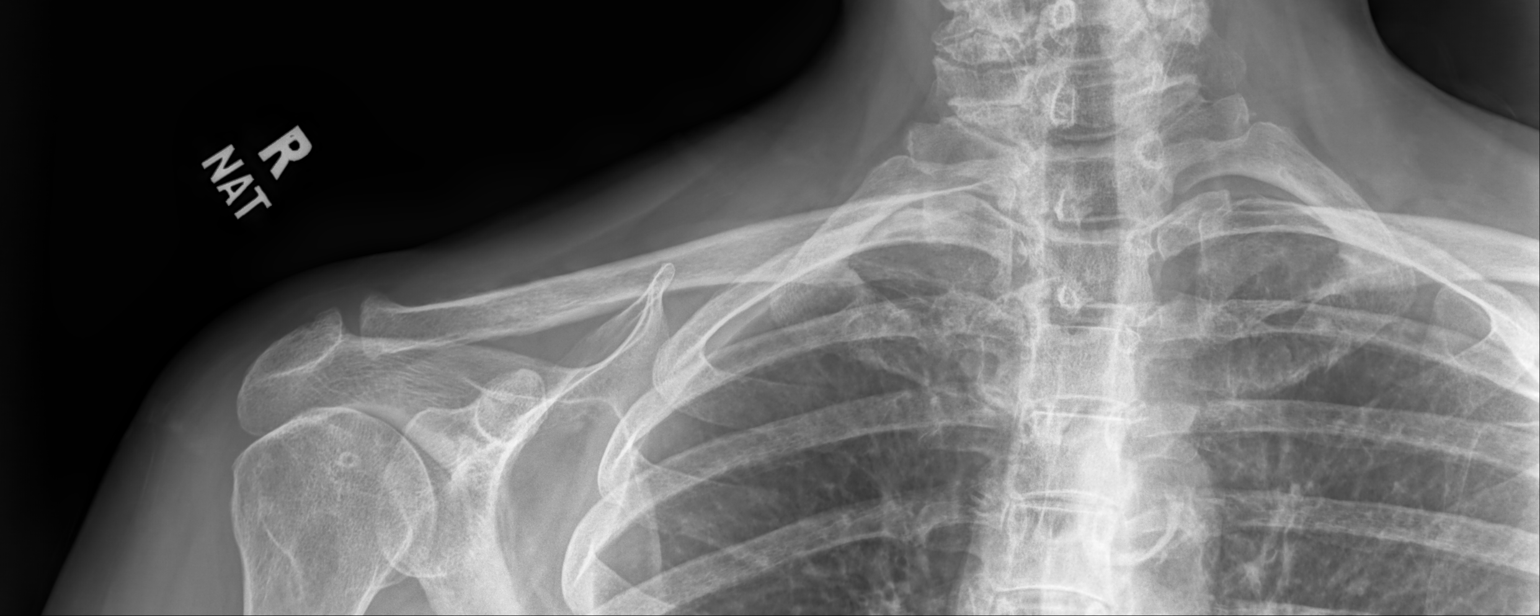

[2 of 2 positions shown; findings below may reference images not displayed]

FINDINGS: There is no evidence of fracture or other focal bone lesions. Soft
tissues are unremarkable.
IMPRESSION: Negative.

## 2021-07-13 MED ORDER — METOPROLOL TARTRATE 25 MG PO TABS: 12.5000 mg | ORAL_TABLET | Freq: Every day | ORAL | 0 refills | Status: DC

## 2021-07-13 MED ORDER — KETOCONAZOLE 1 % EX SHAM: 1.0000 "application " | MEDICATED_SHAMPOO | CUTANEOUS | 1 refills | Status: AC

## 2021-07-13 MED ORDER — LOSARTAN POTASSIUM 25 MG PO TABS: 25.0000 mg | ORAL_TABLET | Freq: Every day | ORAL | 0 refills | Status: DC

## 2021-07-13 NOTE — Progress Notes (Signed)
Pt presents for hypertension follow-up, pt states that her right collar bone is locking up on her wants an xray, will check A1c 6.7% back in 2019 Wants flu vaccine

## 2021-07-13 NOTE — Progress Notes (Signed)
Diabetes screening discussed in office.

## 2021-07-16 NOTE — Progress Notes (Signed)
Please call patient with update.   Right clavicle (collarbone) normal. Continue with plan discussed in office.

## 2021-07-24 ENCOUNTER — Ambulatory Visit: Payer: Medicaid Other | Admitting: Orthopaedic Surgery

## 2021-07-26 ENCOUNTER — Telehealth (INDEPENDENT_AMBULATORY_CARE_PROVIDER_SITE_OTHER): Payer: Self-pay | Admitting: Nurse Practitioner

## 2021-07-26 ENCOUNTER — Encounter: Payer: Self-pay | Admitting: Nurse Practitioner

## 2021-07-26 ENCOUNTER — Other Ambulatory Visit: Payer: Self-pay

## 2021-07-26 DIAGNOSIS — I10 Essential (primary) hypertension: Secondary | ICD-10-CM | POA: Insufficient documentation

## 2021-07-26 MED ORDER — METOPROLOL SUCCINATE ER 25 MG PO TB24: 25.0000 mg | ORAL_TABLET | Freq: Every day | ORAL | 0 refills | Status: DC

## 2021-07-26 NOTE — Patient Instructions (Signed)
Essential hypertension:  - Blood pressure elevated over the past week - Metoprolol 12.5 mg - Will order Toprol XL at 25 mg daily - Continue Losartan as prescribed.  - Counseled on blood pressure goal of less than 130/80, low-sodium, DASH diet, medication compliance, 150 minutes of moderate intensity exercise per week as tolerated. Discussed medication compliance, adverse effects.  Follow up: - Follow-up with primary provider in 4 weeks or sooner if needed.  - Will schedule consult with [facility] pharmacist for medication management

## 2021-07-26 NOTE — Assessment & Plan Note (Signed)
-   Blood pressure elevated over the past week - Metoprolol 12.5 mg - Will order Toprol XL at 25 mg daily - Continue Losartan as prescribed.  - Counseled on blood pressure goal of less than 130/80, low-sodium, DASH diet, medication compliance, 150 minutes of moderate intensity exercise per week as tolerated. Discussed medication compliance, adverse effects.  Follow up: - Follow-up with primary provider in 4 weeks or sooner if needed.  - Will schedule consult with [facility] pharmacist for medication management

## 2021-07-26 NOTE — Progress Notes (Signed)
Virtual Visit via Telephone Note  I connected with Michaela Wells on 07/26/21 at  9:00 AM EST by telephone and verified that I am speaking with the correct person using two identifiers.  Location: Patient: home Provider: office   I discussed the limitations, risks, security and privacy concerns of performing an evaluation and management service by telephone and the availability of in person appointments. I also discussed with the patient that there may be a patient responsible charge related to this service. The patient expressed understanding and agreed to proceed.   History of Present Illness:  Patient presents today for telephone visit for high blood pressure.  Patient was last seen in this office by PCP on 07/13/2021.  At that visit she was started on losartan and told to continue metoprolol at 12.5 mg (half tablet).  Patient states over the past week her blood pressure has been elevated.  She reports that her blood pressure this morning was 168/107.  She states that she has been taking medications as directed. Denies f/c/s, n/v/d, hemoptysis, PND, chest pain or edema.     Observations/Objective:  Self reported Blood Pressure this am 168/107  Vitals with BMI 07/13/2021 05/07/2021 03/27/2021  Height 5' 2.008" 5\' 2"  -  Weight 168 lbs 172 lbs -  BMI 30.72 31.45 -  Systolic 147 122  Diastolic 94 82 90  Pulse 73 88 -  Some encounter information is confidential and restricted. Go to Review Flowsheets activity to see all data.     Assessment and Plan:  Essential hypertension:  - Blood pressure elevated over the past week - Metoprolol 12.5 mg - Will order Toprol XL at 25 mg daily - Continue Losartan as prescribed.  - Counseled on blood pressure goal of less than 130/80, low-sodium, DASH diet, medication compliance, 150 minutes of moderate intensity exercise per week as tolerated. Discussed medication compliance, adverse effects.  Follow up: - Follow-up with primary provider  in 4 weeks or sooner if needed.  - Will schedule consult with [facility] pharmacist for medication management    I discussed the assessment and treatment plan with the patient. The patient was provided an opportunity to ask questions and all were answered. The patient agreed with the plan and demonstrated an understanding of the instructions.   The patient was advised to call back or seek an in-person evaluation if the symptoms worsen or if the condition fails to improve as anticipated.  I provided 23 minutes of non-face-to-face time during this encounter.   HOSP DEL MAESTRO, NP

## 2021-07-31 ENCOUNTER — Ambulatory Visit: Payer: Medicaid Other | Admitting: Orthopaedic Surgery

## 2021-08-06 ENCOUNTER — Ambulatory Visit: Payer: Medicaid Other | Admitting: Pharmacist

## 2021-08-09 ENCOUNTER — Telehealth: Payer: Self-pay | Admitting: Family

## 2021-08-09 NOTE — Telephone Encounter (Signed)
Copied from CRM [id number]. Topic: Appointment Scheduling - Scheduling Inquiry for Clinic >> Aug 07, 2021  3:11 PM Pawlus, Maxine Glenn A wrote: Reason for CRM: Pt called in wanting to reschedule her appt she missed with Lois Huxley, Cornelius Moras, RPH-CPP, please advise.  -CALLED PT TO RESCHEDULE, pt did not answer and VM box is not set up

## 2021-08-11 NOTE — Progress Notes (Signed)
Erroneous encounter

## 2021-08-15 ENCOUNTER — Encounter: Payer: Medicaid Other | Admitting: Family

## 2021-08-15 DIAGNOSIS — I10 Essential (primary) hypertension: Secondary | ICD-10-CM

## 2021-08-22 ENCOUNTER — Other Ambulatory Visit: Payer: Self-pay | Admitting: Family

## 2021-08-22 DIAGNOSIS — I10 Essential (primary) hypertension: Secondary | ICD-10-CM

## 2021-08-27 ENCOUNTER — Other Ambulatory Visit: Payer: Self-pay | Admitting: Family

## 2021-08-27 ENCOUNTER — Other Ambulatory Visit: Payer: Self-pay | Admitting: Family Medicine

## 2021-08-27 ENCOUNTER — Other Ambulatory Visit: Payer: Self-pay | Admitting: Nurse Practitioner

## 2021-08-27 DIAGNOSIS — J449 Chronic obstructive pulmonary disease, unspecified: Secondary | ICD-10-CM

## 2021-08-27 DIAGNOSIS — I10 Essential (primary) hypertension: Secondary | ICD-10-CM

## 2021-08-28 ENCOUNTER — Other Ambulatory Visit: Payer: Self-pay | Admitting: Family Medicine

## 2021-08-28 DIAGNOSIS — J449 Chronic obstructive pulmonary disease, unspecified: Secondary | ICD-10-CM

## 2021-08-28 NOTE — Telephone Encounter (Signed)
Losartan refilled for 30 day courtesy. Last appointment 08/15/2021 no show. Schedule appointment for additional refills.

## 2021-08-31 ENCOUNTER — Ambulatory Visit (INDEPENDENT_AMBULATORY_CARE_PROVIDER_SITE_OTHER): Payer: No Payment, Other | Admitting: Clinical

## 2021-08-31 DIAGNOSIS — F250 Schizoaffective disorder, bipolar type: Secondary | ICD-10-CM | POA: Diagnosis not present

## 2021-08-31 NOTE — Progress Notes (Signed)
° °  THERAPIST PROGRESS NOTE Virtual Visit via Video Note  I connected with Michaela Wells on 08/31/21 at 11:00 AM EST by a video enabled telemedicine application and verified that I am speaking with the correct person using two identifiers.  Location: Patient: home Provider: office   I discussed the limitations of evaluation and management by telemedicine and the availability of in person appointments. The patient expressed understanding and agreed to proceed.   Follow Up Instructions: I discussed the assessment and treatment plan with the patient. The patient was provided an opportunity to ask questions and all were answered. The patient agreed with the plan and demonstrated an understanding of the instructions.   The patient was advised to call back or seek an in-person evaluation if the symptoms worsen or if the condition fails to improve as anticipated.   Session Time: 30 minutes  Participation Level: Active  Behavioral Response: CasualAlertDepressed  Type of Therapy: Individual Therapy  Treatment Goals addressed: Coping  Interventions: CBT and Supportive  Summary:  Michaela Wells is a 61 y.o. female who presents for the scheduled session oriented x5, appropriately dressed, and friendly.  Client reported auditory hallucinations and delusions. Client reported on today she has been feeling depressed. Client reported her medications do not seem to be working except for the [place]. Client reported her psychiatrist was discussing taking her down from it because it is addictive. Client reported feeling discouraged because it is the only medication to alleviates her anxiety. Client reported ongoing difficulty maintaining her feeling of being overwhelmed with the auditory hallucinations. Client reported she knows the voices are not a part of "normal" mental stability but she cannot act as if it is easy to ignore it. Client reported feeling like it is outside of herself such as others  should be able to hear it. Client reported she feels depressed on some days not wanting to get out of bed or doing chores to completion.Client reported it also depresses her because her children make insensitive comments about her being "crazy". Client reported she keeps herself occupied on good days with games on her phone and short spans of TV time. Client reported she has difficulty leaving the house because of how she feels and thinking people judge her.    Suicidal/Homicidal: Nowithout intent/plan  Therapist Response:  Therapist began the appointment asking the client how she has been doing since last seen. Therapist used CBT to utilize active listening and positive emotional support towards her thoughts and feelings. Therapist used CBT to engage with the client to ask open ended questions about her differentiation between reality and unreality. Therapist used CBT to engage and discuss healthy coping skills to alleviate negative feelings pertaining to auditory hallucinations. Therapist assigned the client homework to utilize objects that are pleasing to touch to alleviate. Client was scheduled for next appointment.     Plan: Return again in 4 weeks.  Diagnosis: Schizoaffective disorder, bipolar type    Michaela Wells Y Michaela Somarriba, LCSW 08/31/2021

## 2021-09-04 ENCOUNTER — Other Ambulatory Visit: Payer: Self-pay | Admitting: Nurse Practitioner

## 2021-09-04 MED ORDER — METOPROLOL SUCCINATE ER 25 MG PO TB24: 25.0000 mg | ORAL_TABLET | Freq: Every day | ORAL | 0 refills | Status: DC

## 2021-09-06 ENCOUNTER — Ambulatory Visit: Payer: Medicaid Other | Attending: Family | Admitting: Pharmacist

## 2021-09-06 ENCOUNTER — Encounter: Payer: Self-pay | Admitting: Pharmacist

## 2021-09-06 VITALS — BP 132/93 | HR 84

## 2021-09-06 DIAGNOSIS — I10 Essential (primary) hypertension: Secondary | ICD-10-CM

## 2021-09-06 NOTE — Progress Notes (Signed)
° °  S:    Patient arrives in good spirits.    Presents to the clinic for hypertension evaluation, counseling, and management. Patient was referred and last seen by Angus Seller on 07/26/2021.   Medication adherence reported.  Current BP Medications include:  metoprolol succinate 25 mg daily, losartan 25 mg daily   Dietary habits include: does not limits salt. Drinks ~12-pack of [employer] daily.  Exercise habits include: none. Will do some stretches in the morning. No aerobic exercise.  Family / Social history:  -Fhx: MI, stroke, HTN, RA, heart disease -Tobacco: current everyday smoker  -Alcohol: none reported    O:  Vitals:   09/06/21 1556  BP: (!) 132/93  Pulse: 84   Home BP readings: none  Last 3 Office BP readings: BP Readings from Last 3 Encounters:  09/06/21 (!) 132/93  07/13/21 (!) 147/94  05/07/21 122/82    BMET    Component Value Date/Time   NA 140 03/27/2021 0000   K 4.9 03/27/2021 0000   CL 108 (H) 03/27/2021 0000   CO2 20 03/27/2021 0000   GLUCOSE 80 03/27/2021 0000   GLUCOSE 90 03/11/2021 0718   BUN 8 03/27/2021 0000   CREATININE 0.85 03/27/2021 0000   CALCIUM 8.6 (L) 03/27/2021 0000   GFRNONAA 55 (L) 03/11/2021 0718   GFRAA >60 04/04/2020 1630    Renal function: CrCl cannot be calculated (Patient's most recent lab result is older than the maximum 21 days allowed.).  Clinical ASCVD: No  The ASCVD Risk score (Arnett DK, et al., 2019) failed to calculate for the following reasons:   Cannot find a previous HDL lab   Cannot find a previous total cholesterol lab   A/P: Hypertension diagnosed currently above goal but improving on current medications. BP Goal = < 130/90 mmHg. Medication adherence reported. She started losartan in November. Metoprolol was changed to succinate at 25 mg daily last month. Pt wishes to return in 1 month for reassessment before increasing the losartan today.  -Continued current regimen.  -F/u labs ordered - none as she is not  fasting. Will plan on CMP at next visit.  -Counseled on lifestyle modifications for blood pressure control including reduced dietary sodium, increased exercise, adequate sleep.  Results reviewed and written information provided.   Total time in face-to-face counseling 30 minutes.   F/U Clinic Visit in 1 month.   Butch Penny, PharmD, P[place]y [place], CPP Clinical Pharmacist [facility] [phone]

## 2021-09-08 NOTE — Progress Notes (Signed)
Patient ID: Michaela Wells, female    DOB: August 29, 1960  MRN: [id number]  CC: Hypertension Follow-Up   Subjective: Michaela Wells is a 61 y.o. female who presents for hypertension follow-up.  Her concerns today include:  HYPERTENSION FOLLOW-UP: 07/13/2021: - Continue Metoprolol Tartrate as prescribed. - Begin Losartan as prescribed.   09/11/2021: Doing well on current regimen. No side effects. No issues/concerns. Denies chest pain and shortness of breath.   2. COPD FOLLOW-UP: Requesting refills of Anoro.   Patient Active Problem List   Diagnosis Date Noted   Essential hypertension 07/26/2021   Tobacco use disorder 04/09/2021   Marijuana use 04/09/2021   Dehydration 03/04/2021   Transaminitis 03/04/2021   Auditory hallucination 03/04/2021   Visual hallucinations [id number]   Acute metabolic encephalopathy [id number]   AKI (acute kidney injury) ([place]) 02/26/2021   Hypokalemia 02/26/2021   Schizoaffective disorder, bipolar type ([place]) 06/14/2020   Reaction to severe stress, unspecified 10/29/2019   Psychosis ([place]) 10/23/2019   Alcohol dependence, uncomplicated ([place]) [id number]   Nicotine dependence, unspecified, uncomplicated [id number]   Generalized anxiety disorder 05/14/2018   PTSD (post-traumatic stress disorder) 05/06/2018   Bipolar I disorder, current or most recent episode depressed, with psychotic features ([place]) 05/05/2018   Depression 11/07/2014   Marihuana abuse 11/07/2014   Anxiety 11/07/2014   COPD (chronic obstructive pulmonary disease) ([place]) 10/09/2013   GAD (generalized anxiety disorder) 10/09/2013   Smoking trying to quit 10/09/2013   GERD (gastroesophageal reflux disease) 10/09/2013     Current Outpatient Medications on File Prior to Visit  Medication Sig Dispense Refill   albuterol (VENTOLIN HFA) 108 (90 Base) MCG/ACT inhaler INHALE 1 PUFF INTO THE LUNGS EVERY 4 HOURS AS NEEDED FOR WHEEZING OR SHORTNESS OF BREATH 8.5 g 1   benztropine (COGENTIN) 1 MG  tablet TAKE 1 TABLET(1 MG) BY MOUTH TWICE DAILY 60 tablet 2   clonazePAM (KLONOPIN) 0.5 MG tablet TAKE 1 TABLET(0.5 MG) BY MOUTH TWICE DAILY 60 tablet 2   DULoxetine (CYMBALTA) 20 MG capsule Take 1 capsule (20 mg total) by mouth daily. 30 capsule 2   fluticasone (FLONASE) 50 MCG/ACT nasal spray Place 2 sprays into both nostrils daily.     nicotine (NICODERM CQ - DOSED IN MG/24 HOURS) 21 mg/24hr patch Place 1 patch (21 mg total) onto the skin daily. 28 patch 0   OLANZapine zydis (ZYPREXA) 5 MG disintegrating tablet Take 1 tablet (5 mg total) by mouth 2 (two) times daily. 60 tablet 2   [DISCONTINUED] carvedilol (COREG) 25 MG tablet Take 1 tablet (25 mg total) by mouth 2 (two) times daily with a meal. (Patient not taking: Reported on 02/26/2021) 60 tablet 2   [DISCONTINUED] omega-3 acid ethyl esters (LOVAZA) 1 g capsule Take 1 capsule (1 g total) by mouth 2 (two) times daily. (Patient not taking: Reported on 02/26/2021) 60 capsule 5   No current facility-administered medications on file prior to visit.    Allergies  Allergen Reactions   Penicillins Other (See Comments)    Childhood allergy- Reaction?? Has patient had a PCN reaction causing immediate rash, facial/tongue/throat swelling, SOB or lightheadedness with hypotension: Unknown Has patient had a PCN reaction causing severe rash involving mucus membranes or skin necrosis: Unknown Has patient had a PCN reaction that required hospitalization: Unknown Has patient had a PCN reaction occurring within the last 10 years: Unknown If all of the above answers are "NO", then may proceed with Cephalosporin use.      Social History   Socioeconomic History  Marital status: Single    Spouse name: Not on file   Number of children: Not on file   Years of education: Not on file   Highest education level: Not on file  Occupational History   Not on file  Tobacco Use   Smoking status: Every Day   Smokeless tobacco: Never  Substance and Sexual Activity    Alcohol use: Yes   Drug use: Not Currently   Sexual activity: Not Currently  Other Topics Concern   Not on file  Social History Narrative   Right handed    Caffeine- 12 pack of mountain dew    Lives at home with 2 daughters and 3 grandkids   Social Determinants of Health   Financial Resource Strain: Not on file  Food Insecurity: Not on file  Transportation Needs: Not on file  Physical Activity: Not on file  Stress: Not on file  Social Connections: Not on file  Intimate Partner Violence: Not on file    Family History  Problem Relation Age of Onset   Other Mother        MVA-Deceased [pt was age 60]   COPD Father        Deceased   Heart attack Maternal Grandmother    Stroke Maternal Grandmother    Brain cancer Maternal Grandmother    Arthritis/Rheumatoid Maternal Grandmother    Heart attack Maternal Grandfather    Stroke Maternal Grandfather    Heart disease Maternal Aunt    Stroke Maternal Aunt        #1   Heart attack Maternal Aunt        #1   Hypertension Maternal Aunt        #1   Arthritis/Rheumatoid Sister    COPD Sister    Heart disease Maternal Uncle    Heart attack Maternal Uncle        x4   Heart disease Brother        #1   Heart attack Brother        #1   Diabetes Brother    Diabetes Maternal Aunt    Mental retardation Maternal Aunt    Healthy Daughter        x2   Drug abuse Daughter        #1   Drug abuse Daughter        #2   Kidney Stones Daughter        #2   Other Son        Agoraphobia    Past Surgical History:  Procedure Laterality Date   ESOPHAGEAL DILATION     TONSILLECTOMY      ROS: Review of Systems Negative except as stated above  PHYSICAL EXAM: BP 120/76    Pulse 70    Temp 98 F (36.7 C)    Resp 18    Ht 5' 2.01" (1.575 m)    Wt 165 lb (74.8 kg)    SpO2 93%    BMI 30.17 kg/m   Physical Exam HENT:     Head: Normocephalic and atraumatic.  Eyes:     Extraocular Movements: Extraocular movements intact.      Conjunctiva/sclera: Conjunctivae normal.     Pupils: Pupils are equal, round, and reactive to light.  Cardiovascular:     Rate and Rhythm: Normal rate and regular rhythm.     Pulses: Normal pulses.     Heart sounds: Normal heart sounds.  Pulmonary:     Effort: Pulmonary effort is normal.  Breath sounds: Normal breath sounds.  Musculoskeletal:     Cervical back: Normal range of motion and neck supple.  Neurological:     General: No focal deficit present.     Mental Status: She is alert and oriented to person, place, and time.  Psychiatric:        Mood and Affect: Mood normal.        Behavior: Behavior normal.    ASSESSMENT AND PLAN: 1. Essential hypertension: - Continue Losartan and Metoprolol Succinate as prescribed.  - Counseled on blood pressure goal of less than 130/80, low-sodium, DASH diet, medication compliance, 150 minutes of moderate intensity exercise per week as tolerated. Discussed medication compliance, adverse effects. - Follow-up with primary provider in 3 months or sooner if needed.  - losartan (COZAAR) 25 MG tablet; Take 1 tablet (25 mg total) by mouth daily.  Dispense: 90 tablet; Refill: 0 - metoprolol succinate (TOPROL-XL) 25 MG 24 hr tablet; Take 1 tablet (25 mg total) by mouth daily.  Dispense: 30 tablet; Refill: 0  2. Chronic obstructive pulmonary disease, unspecified COPD type ([place]): - Continue Umeclidinium-Vilanterol as prescribed.  - Follow-up with primary provider as scheduled.  - umeclidinium-vilanterol (ANORO ELLIPTA) 62.5-25 MCG/ACT AEPB; Inhale 1 puff into the lungs daily.  Dispense: 90 each; Refill: 0  3. Pain of right clavicle: - Diagnostic x-ray right clavicle for further evaluation.  - DG Clavicle Right; Future   Patient was given the opportunity to ask questions.  Patient verbalized understanding of the plan and was able to repeat key elements of the plan. Patient was given clear instructions to go to Emergency Department or return to medical  center if symptoms don't improve, worsen, or new problems develop.The patient verbalized understanding.   Orders Placed This Encounter  Procedures   DG Clavicle Right     Requested Prescriptions   Signed Prescriptions Disp Refills   losartan (COZAAR) 25 MG tablet 90 tablet 0    Sig: Take 1 tablet (25 mg total) by mouth daily.   metoprolol succinate (TOPROL-XL) 25 MG 24 hr tablet 30 tablet 0    Sig: Take 1 tablet (25 mg total) by mouth daily.   umeclidinium-vilanterol (ANORO ELLIPTA) 62.5-25 MCG/ACT AEPB 90 each 0    Sig: Inhale 1 puff into the lungs daily.    Return in about 3 months (around 12/10/2021) for Follow-Up or next available hypertension .  Camillia Herter, NP

## 2021-09-11 ENCOUNTER — Other Ambulatory Visit: Payer: Self-pay

## 2021-09-11 ENCOUNTER — Other Ambulatory Visit: Payer: Self-pay | Admitting: Family

## 2021-09-11 ENCOUNTER — Encounter: Payer: Self-pay | Admitting: Family

## 2021-09-11 ENCOUNTER — Ambulatory Visit (INDEPENDENT_AMBULATORY_CARE_PROVIDER_SITE_OTHER): Payer: Medicaid Other | Admitting: Family

## 2021-09-11 ENCOUNTER — Ambulatory Visit: Payer: Medicaid Other

## 2021-09-11 VITALS — BP 120/76 | HR 70 | Temp 98.0°F | Resp 18 | Ht 62.01 in | Wt 165.0 lb

## 2021-09-11 DIAGNOSIS — I10 Essential (primary) hypertension: Secondary | ICD-10-CM | POA: Diagnosis not present

## 2021-09-11 DIAGNOSIS — J449 Chronic obstructive pulmonary disease, unspecified: Secondary | ICD-10-CM | POA: Diagnosis not present

## 2021-09-11 DIAGNOSIS — M898X1 Other specified disorders of bone, shoulder: Secondary | ICD-10-CM

## 2021-09-11 MED ORDER — LOSARTAN POTASSIUM 25 MG PO TABS: 25.0000 mg | ORAL_TABLET | Freq: Every day | ORAL | 0 refills | Status: DC

## 2021-09-11 MED ORDER — ANORO ELLIPTA 62.5-25 MCG/ACT IN AEPB: 1.0000 | INHALATION_SPRAY | Freq: Every day | RESPIRATORY_TRACT | 0 refills | Status: DC

## 2021-09-11 MED ORDER — METOPROLOL SUCCINATE ER 25 MG PO TB24: 25.0000 mg | ORAL_TABLET | Freq: Every day | ORAL | 0 refills | Status: DC

## 2021-09-11 NOTE — Progress Notes (Signed)
Pt presents for hypertension follow-up  ?

## 2021-09-25 ENCOUNTER — Ambulatory Visit: Payer: Medicaid Other | Admitting: Orthopaedic Surgery

## 2021-10-02 ENCOUNTER — Telehealth (INDEPENDENT_AMBULATORY_CARE_PROVIDER_SITE_OTHER): Payer: Medicaid Other | Admitting: Psychiatry

## 2021-10-02 ENCOUNTER — Encounter (HOSPITAL_COMMUNITY): Payer: Self-pay | Admitting: Psychiatry

## 2021-10-02 DIAGNOSIS — F17200 Nicotine dependence, unspecified, uncomplicated: Secondary | ICD-10-CM | POA: Diagnosis not present

## 2021-10-02 DIAGNOSIS — F411 Generalized anxiety disorder: Secondary | ICD-10-CM

## 2021-10-02 DIAGNOSIS — F250 Schizoaffective disorder, bipolar type: Secondary | ICD-10-CM | POA: Diagnosis not present

## 2021-10-02 MED ORDER — BENZTROPINE MESYLATE 1 MG PO TABS: ORAL_TABLET | ORAL | 2 refills | Status: DC

## 2021-10-02 MED ORDER — DULOXETINE HCL 40 MG PO CPEP: 40.0000 mg | ORAL_CAPSULE | Freq: Every day | ORAL | 3 refills | Status: DC

## 2021-10-02 MED ORDER — OLANZAPINE 15 MG PO TBDP: 15.0000 mg | ORAL_TABLET | Freq: Every day | ORAL | 3 refills | Status: DC

## 2021-10-02 MED ORDER — NICOTINE 21 MG/24HR TD PT24: 21.0000 mg | MEDICATED_PATCH | Freq: Every day | TRANSDERMAL | 0 refills | Status: DC
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
## 2021-10-03 ENCOUNTER — Telehealth (HOSPITAL_COMMUNITY): Payer: Self-pay | Admitting: *Deleted

## 2021-10-03 ENCOUNTER — Other Ambulatory Visit (HOSPITAL_COMMUNITY): Payer: Self-pay | Admitting: Psychiatry

## 2021-10-03 MED ORDER — DULOXETINE HCL 40 MG PO CPEP: 40.0000 mg | ORAL_CAPSULE | Freq: Every day | ORAL | 3 refills | Status: DC

## 2021-10-03 NOTE — Telephone Encounter (Signed)
Rx sent P.A. request for DULoxetine HCl 40 MG CAP. Was told by [facility] patient has a managed care thru [facility] # [phone] & that the Phone #  & Address we have on file doesn't match what they have listed as a [place] resident.  Notified Patient & RX that patient is listed as having [place] medicaid. Patient stated she had spoke with [place] Medicaid & when asked for the [facility] Medicaid # she doesn't have it nor know it. Suggested she reaches back to out to [facility].

## 2021-10-09 ENCOUNTER — Ambulatory Visit (INDEPENDENT_AMBULATORY_CARE_PROVIDER_SITE_OTHER): Payer: Medicaid Other | Admitting: Orthopaedic Surgery

## 2021-10-09 ENCOUNTER — Other Ambulatory Visit: Payer: Self-pay

## 2021-10-09 ENCOUNTER — Encounter: Payer: Self-pay | Admitting: Orthopaedic Surgery

## 2021-10-09 DIAGNOSIS — M25511 Pain in right shoulder: Secondary | ICD-10-CM | POA: Diagnosis not present

## 2021-10-09 NOTE — Progress Notes (Signed)
Office Visit Note   Patient: Michaela Wells           Date of Birth: [birth year]           MRN: [id number] Visit Date: 10/09/2021              Requested by: Michaela Herter, NP Michaela Wells,  Michaela Wells [id number] PCP: Michaela Herter, NP   Assessment & Plan: Visit Diagnoses:  1. Sternoclavicular joint pain, right     Plan: In terms of the clavicle pain this is related to previous trauma which is caused [place] joint subluxation anteriorly.  I discussed that nonsurgical treatment options are the mainstay that include physical therapy joint injection Voltaren gel and immobilization during flareups.  I do not recommend surgical treatment if symptoms are manageable.  For the hand numbness and tingling my suspicion is that she has carpal tunnel syndrome.  She declined any intervention for now.  She will try Voltaren gel for the [facility] joint.  We will see her back as needed.  Follow-Up Instructions: No follow-ups on file.   Orders:  No orders of the defined types were placed in this encounter.  No orders of the defined types were placed in this encounter.     Procedures: No procedures performed   Clinical Data: No additional findings.   Subjective: Chief Complaint  Patient presents with   Right Shoulder - Pain    CLAVICLE    HPI  [name] is a 61 year old female here for evaluation of right clavicle pain.  She had a mechanical fall onto her shoulder a few years ago.  She reports popping over the right [place] joint.  Denies any breathing or swallowing problems.  She also reports numbness and tingling in the fingers and hands.  This wakes her up at night.  Review of Systems  Constitutional: Negative.   HENT: Negative.    Eyes: Negative.   Respiratory: Negative.    Cardiovascular: Negative.   Endocrine: Negative.   Musculoskeletal: Negative.   Neurological: Negative.   Hematological: Negative.   Psychiatric/Behavioral: Negative.    All other systems reviewed and  are negative.   Objective: Vital Signs: There were no vitals taken for this visit.  Physical Exam Vitals and nursing note reviewed.  Constitutional:      Appearance: She is well-developed.  HENT:     Head: Normocephalic and atraumatic.  Pulmonary:     Effort: Pulmonary effort is normal.  Abdominal:     Palpations: Abdomen is soft.  Musculoskeletal:     Cervical back: Neck supple.  Skin:    General: Skin is warm.     Capillary Refill: Capillary refill takes less than 2 seconds.  Neurological:     Mental Status: She is alert and oriented to person, place, and time.  Psychiatric:        Behavior: Behavior normal.        Thought Content: Thought content normal.        Judgment: Judgment normal.    Ortho Exam  Examination of the right shoulder girdle shows normal range of motion.  She does have palpable clunking of the right [place] joint anteriorly.  There is some slight swelling to this area.  No signs of infection.  No breathing or swallowing problems.  Examination of bilateral hands show positive carpal tunnel compressive signs.  No muscle atrophy.  Decreased sensation to the fingertips.  Specialty Comments:  No specialty comments available.  Imaging: No  results found.   PMFS History: Patient Active Problem List   Diagnosis Date Noted   Sternoclavicular joint pain, right 10/09/2021   Essential hypertension 07/26/2021   Tobacco use disorder 04/09/2021   Marijuana use 04/09/2021   Dehydration 03/04/2021   Transaminitis 03/04/2021   Auditory hallucination 03/04/2021   Visual hallucinations [id number]   Acute metabolic encephalopathy [id number]   AKI (acute kidney injury) ([place]) 02/26/2021   Hypokalemia 02/26/2021   Schizoaffective disorder, bipolar type ([place]) 06/14/2020   Reaction to severe stress, unspecified 10/29/2019   Psychosis ([place]) 10/23/2019   Alcohol dependence, uncomplicated ([place]) [id number]   Nicotine dependence, unspecified, uncomplicated [id number]    Generalized anxiety disorder 05/14/2018   PTSD (post-traumatic stress disorder) 05/06/2018   Bipolar I disorder, current or most recent episode depressed, with psychotic features ([place]) 05/05/2018   Depression 11/07/2014   Marihuana abuse 11/07/2014   Anxiety 11/07/2014   COPD (chronic obstructive pulmonary disease) ([place]) 10/09/2013   GAD (generalized anxiety disorder) 10/09/2013   Smoking trying to quit 10/09/2013   GERD (gastroesophageal reflux disease) 10/09/2013   Past Medical History:  Diagnosis Date   Bronchial asthma    Chicken pox    COPD (chronic obstructive pulmonary disease) ([place])    Depression    Frequent headaches    GERD (gastroesophageal reflux disease)    UTI (Michaela urinary tract infection)     Family History  Problem Relation Age of Onset   Other Mother        MVA-Deceased [pt was age 25]   COPD Father        Deceased   Heart attack Maternal Grandmother    Stroke Maternal Grandmother    Brain cancer Maternal Grandmother    Arthritis/Rheumatoid Maternal Grandmother    Heart attack Maternal Grandfather    Stroke Maternal Grandfather    Heart disease Maternal Aunt    Stroke Maternal Aunt        #1   Heart attack Maternal Aunt        #1   Hypertension Maternal Aunt        #1   Arthritis/Rheumatoid Sister    COPD Sister    Heart disease Maternal Uncle    Heart attack Maternal Uncle        x4   Heart disease Brother        #1   Heart attack Brother        #1   Diabetes Brother    Diabetes Maternal Aunt    Mental retardation Maternal Aunt    Healthy Daughter        x2   Drug abuse Daughter        #1   Drug abuse Daughter        #2   Kidney Stones Daughter        #2   Other Son        Agoraphobia    Past Surgical History:  Procedure Laterality Date   ESOPHAGEAL DILATION     TONSILLECTOMY     Social History   Occupational History   Not on file  Tobacco Use   Smoking status: Every Day   Smokeless tobacco: Never  Substance and Sexual  Activity   Alcohol use: Yes   Drug use: Not Currently   Sexual activity: Not Currently

## 2021-10-11 ENCOUNTER — Ambulatory Visit: Payer: Medicaid Other | Admitting: Pharmacist

## 2021-10-13 ENCOUNTER — Other Ambulatory Visit: Payer: Self-pay | Admitting: Family

## 2021-10-13 ENCOUNTER — Other Ambulatory Visit (HOSPITAL_COMMUNITY): Payer: Self-pay | Admitting: Psychiatry

## 2021-10-13 DIAGNOSIS — J449 Chronic obstructive pulmonary disease, unspecified: Secondary | ICD-10-CM

## 2021-10-13 DIAGNOSIS — F411 Generalized anxiety disorder: Secondary | ICD-10-CM

## 2021-11-02 ENCOUNTER — Ambulatory Visit (INDEPENDENT_AMBULATORY_CARE_PROVIDER_SITE_OTHER): Payer: Medicaid Other | Admitting: Clinical

## 2021-11-02 DIAGNOSIS — F250 Schizoaffective disorder, bipolar type: Secondary | ICD-10-CM

## 2021-11-04 NOTE — Progress Notes (Unsigned)
°  THERAPIST PROGRESS NOTE   Session Time: 40 minutes  Participation Level: Active  Behavioral Response: CasualAlertDepressed  Type of Therapy: Individual Therapy  Treatment Goals addressed: will attend 80% of medication management appointments.  ProgressTowards Goals: Not Progressing  Interventions: CBT  Summary:  Michaela Wells is a 61 y.o. female who presents for the scheduled session oriented times five, appropriately dressed, and friendly. Client reported hallucinations and delusions. Client reported on today she has not been doing well with managing her depression and anxiety. Client reported she was crying during her last psychiatry appointment. Client reported Klonopin was added to her med regimen which she feels has been helpful. Client reported feeling discouraged because the doc said at some point she will have to be taken off of them. Client reported she has been taking them at night since that is when she struggles the most with hearing "the neighbors". Client reported somehow the neighbors know what she is doing and have the ability to hurt her physically. Client reported her family and others try to tell her the voices and things that she perceives as happening isn't real but as much as she would like to believe them but cannot. Client reported the voices tell her they will cause her pain and she does have intense stomach or chest pain. Client reported it's what keeps her up during the night and causes her to panic. Client reported the pain usually last 5, but 10 minutes max. Client reported she has a hard time distinguishing between what's real and what isn't.  Evidence of progress towards goal:  client reported she has not been able to practice coping skills 0 out of 0 days per week during her moments of panic. Client reported she does try to eliminate shows that may trigger repose such as drama or traumatic tv shows or movies.     Suicidal/Homicidal: Nowithout  intent/plan  Therapist Response:  Therapist began the appointment asking the client how she has been doing since last seen. Therapist used CBT to engage using active listening and positive emotional support towards her thoughts and feelings. Therapist used CBT to have the client describe effectiveness of symptom management with medication compliance.  Therapist used CBT to ask the client about sleeping patterns and other times of the day negatively impacted by her hallucinations and delusions. Therapist used CBT ask the client to identify her progress with frequency of use with coping skills with continued practice in her daily activity.    Therapist assigned the client homework to write down a reminder a use controlled breathing and use music in her earphones to help her through moments of distress. Client was scheduled for next appointment.    Plan: Return again in 5 weeks.  Diagnosis: schizoaffective disorder, bipolar type  Collaboration of Care: Patient refused AEB Therapist discussed the client considering engagement with ACT services. Client declined wanting to have intensive in -home services  at this time.  Patient/Guardian was advised Release of Information must be obtained prior to any record release in order to collaborate their care with an outside provider. Patient/Guardian was advised if they have not already done so to contact the registration department to sign all necessary forms in order for [place] to release information regarding their care.   Consent: Patient/Guardian gives verbal consent for treatment and assignment of benefits for services provided during this visit. Patient/Guardian expressed understanding and agreed to proceed.   Neena Rhymes Mubarak Bevens, LCSW 11/02/2021

## 2021-11-04 NOTE — Plan of Care (Signed)
Client was in agreement to the treatment plan. °

## 2021-11-05 ENCOUNTER — Other Ambulatory Visit: Payer: Self-pay

## 2021-11-05 DIAGNOSIS — I10 Essential (primary) hypertension: Secondary | ICD-10-CM

## 2021-11-05 MED ORDER — METOPROLOL SUCCINATE ER 25 MG PO TB24: 25.0000 mg | ORAL_TABLET | Freq: Every day | ORAL | 0 refills | Status: DC

## 2021-11-19 ENCOUNTER — Other Ambulatory Visit: Payer: Self-pay | Admitting: Family

## 2021-11-19 DIAGNOSIS — J449 Chronic obstructive pulmonary disease, unspecified: Secondary | ICD-10-CM

## 2021-11-21 NOTE — Telephone Encounter (Signed)
Refused Anoro Ellipta because being requested too early.    ?

## 2021-11-30 ENCOUNTER — Ambulatory Visit (INDEPENDENT_AMBULATORY_CARE_PROVIDER_SITE_OTHER): Payer: Medicaid Other | Admitting: Clinical

## 2021-11-30 DIAGNOSIS — F250 Schizoaffective disorder, bipolar type: Secondary | ICD-10-CM | POA: Diagnosis not present

## 2021-12-01 ENCOUNTER — Encounter (HOSPITAL_COMMUNITY): Payer: Self-pay

## 2021-12-01 NOTE — Progress Notes (Signed)
?THERAPIST PROGRESS NOTE ?Virtual Visit via Video Note ? ?I connected with Michaela Wells on 11/30/2021 at 11:00 AM EDT by a video enabled telemedicine application and verified that I am speaking with the correct person using two identifiers. ? ?Location: ?Patient: home ?Provider: office ?  ?I discussed the limitations of evaluation and management by telemedicine and the availability of in person appointments. The patient expressed understanding and agreed to proceed. ? ? ?Follow Up Instructions: ?I discussed the assessment and treatment plan with the patient. The patient was provided an opportunity to ask questions and all were answered. The patient agreed with the plan and demonstrated an understanding of the instructions. ?  ?The patient was advised to call back or seek an in-person evaluation if the symptoms worsen or if the condition fails to improve as anticipated. ? ? ? ?Session Time: 20 minutes ? ?Participation Level: Active ? ?Behavioral Response: CasualAlertDepressed ? ?Type of Therapy: Individual Therapy ? ?Treatment Goals addressed: attend 80% of medication management follow up appointments ? ?ProgressTowards Goals: Not Progressing ? ?Interventions: CBT and Supportive ? ?Summary:  ?Michaela Wells is a 61 y.o. female who presents for the scheduled session oriented times five, appropriately dressed, and friendly. Client reported hallucinations and delusions. ?Client reported she has been feeling depressed and overwhelmed. Client reported she has been having people come out to their house to fix pipes and it has been overwhelming. Client reported she has been taking her klonopin medication at night instead of during the day because she feels more scared at night. Client reported the voices of the neighbors wake her up during the night. Client reported they continue to cause her physical pain. Client reported they say they want her to have a heart attack and then her heart starts hurting. Client reported  she tries to do things around the house that she wants like cleaning but states they make her back hurt when she does so she sits down.  ?Evidence of progress towards goal:  Client reported she did not practice the last sessions homework of controlled breathing and listening and meditation music at least 3 out of 7 days to help with anxiety and perceived physical pain. Client reported she listens to TV but the voice of the neighbors talk louder than the TV. ? ? ? ? ?Suicidal/Homicidal: Nowithout intent/plan ? ?Therapist Response:  ?Therapist began the session asking the client how she has been doing since last seen. ?Therapist used CBT to engage using active listening and positive emotional support. ?Therapist used CBT to discuss severity of the hallucinations and delusions and triggers for it. ?Therapist used CBT to ask about medication compliance. ?Therapist used CBT to engage and discuss mindful mediatation techniques. ?Therapist used CBT ask the client to identify her progress with frequency of use with coping skills with continued practice in her daily activity.    ?Therapist assigned the client homework to try the controlled breathing method. ?Client was scheduled for next appointment. ? ? ? ?Plan: Return again in 5 weeks. ? ?Diagnosis: schizoaffective disorder, bipolar type ? ?Collaboration of Care: Patient refused AEB none requested by the client. ? ?Patient/Guardian was advised Release of Information must be obtained prior to any record release in order to collaborate their care with an outside provider. Patient/Guardian was advised if they have not already done so to contact the registration department to sign all necessary forms in order for [place] to release information regarding their care.  ? ?Consent: Patient/Guardian gives verbal consent for treatment and assignment  of benefits for services provided during this visit. Patient/Guardian expressed understanding and agreed to proceed.  ? ?Michaela Rhymes Cowen Pesqueira,  LCSW ?11/30/2021 ? ?

## 2021-12-01 NOTE — Plan of Care (Signed)
Client was in agreement to the plan. ?

## 2021-12-13 ENCOUNTER — Other Ambulatory Visit: Payer: Self-pay | Admitting: Family

## 2021-12-13 DIAGNOSIS — J449 Chronic obstructive pulmonary disease, unspecified: Secondary | ICD-10-CM

## 2021-12-14 ENCOUNTER — Other Ambulatory Visit: Payer: Self-pay | Admitting: Family

## 2021-12-14 DIAGNOSIS — J449 Chronic obstructive pulmonary disease, unspecified: Secondary | ICD-10-CM

## 2021-12-19 ENCOUNTER — Telehealth (HOSPITAL_COMMUNITY): Payer: Medicaid Other | Admitting: Psychiatry

## 2021-12-27 ENCOUNTER — Other Ambulatory Visit (HOSPITAL_COMMUNITY): Payer: Self-pay | Admitting: Psychiatry

## 2021-12-27 DIAGNOSIS — F250 Schizoaffective disorder, bipolar type: Secondary | ICD-10-CM

## 2021-12-27 DIAGNOSIS — F411 Generalized anxiety disorder: Secondary | ICD-10-CM

## 2022-01-01 ENCOUNTER — Telehealth (HOSPITAL_COMMUNITY): Payer: Self-pay | Admitting: *Deleted

## 2022-01-01 ENCOUNTER — Ambulatory Visit (INDEPENDENT_AMBULATORY_CARE_PROVIDER_SITE_OTHER): Payer: Medicaid Other | Admitting: Clinical

## 2022-01-01 DIAGNOSIS — F250 Schizoaffective disorder, bipolar type: Secondary | ICD-10-CM | POA: Diagnosis not present

## 2022-01-01 NOTE — Progress Notes (Signed)
? ?THERAPIST PROGRESS NOTE ?Virtual Visit via Video Note ? ?I connected with Michaela Wells on 01/01/2022 at  9:00 AM EDT by a video enabled telemedicine application and verified that I am speaking with the correct person using two identifiers. ? ?Location: ?Patient: home ?Provider: office ?  ?I discussed the limitations of evaluation and management by telemedicine and the availability of in person appointments. The patient expressed understanding and agreed to proceed. ? ? ?Follow Up Instructions: ?I discussed the assessment and treatment plan with the patient. The patient was provided an opportunity to ask questions and all were answered. The patient agreed with the plan and demonstrated an understanding of the instructions. ?  ?The patient was advised to call back or seek an in-person evaluation if the symptoms worsen or if the condition fails to improve as anticipated. ? ? ? ?Session Time: 40 minutes ? ?Participation Level: Active ? ?Behavioral Response: CasualAlertDepressed ? ?Type of Therapy: Individual Therapy ? ?Treatment Goals addressed: client will attend 80% of medication management follow up's ? ?ProgressTowards Goals: Not Progressing ? ?Interventions: CBT and Supportive ? ?Summary:  ?Michaela Wells is a 61 y.o. female who presents for the scheduled session oriented times five and friendly. Client reported visual, auditory hallucinations and delusions.  ?Client reported on today she has been feeling anxious and depressed. Client reported she has run out of her psychiatric medications since last Thursday 12/27/2021. Client reported since that time she has been having visual hallucinations of various things.  Client reported she has been seeing a girl and guy with the baby. Client reported seeing the hallucination caused her to feel a sense of "love".  Client reported it does bother her that she is having these visuals.  Client reported she asked her daughter to check behind her to see if she also started.   Client reported her daughter responded in a way of agitation and told her that no one is there and if she continues to report the things that she would have her hospitalized.  Client reported it caused her to have a sense of sadness that she cannot talk to her children about what she is experiencing without them negatively reacting.  Client reported also she saw a visual of the circus in her backyard. Client reported she has been having increased difficulty with feeling confused and forgetful.  Throughout the appointment client stopped midsentence due to stating that she forgot what she was going to say. Client reported she has a hard time recalling things to work on in between therapy appointments because she is quick to forget what was discussed. ?Evidence of progress towards goal:  client reported she uses coping skills of listening to music and watching tv show to improve her mood. Client reported she is compliant with medication management 7 out of 7 days per week. ? ? ? ?Suicidal/Homicidal: Nowithout intent/plan ? ?Therapist Response:  ?Therapist began the appointment asking the client how she has been doing since she was last seen. ?Therapist used CBT to engage using active listening and positive emotional support towards her thoughts and feelings. ?Therapist used CBT to engage and ask the client her change of symptoms and the effect of visual hallucinations have on her emotions. ?Therapist used CBT to ask open ended questions about the connotation of her hallucinations and ensure safety of herself in the home with others. ?Therapist used CBT to ask the client about barriers to medication compliance. ?Therapist used CBT ask the client to identify her progress with frequency  of use with coping skills with continued practice in her daily activity.    ?Therapist assigned the client homework of reinforcing her use of enjoyed activities to help improve her mood.  ?Client was scheduled for next  appointment. ? ? ? ?Plan: Return again in 5 weeks. ? ?Diagnosis: schizoaffective disorder, bipolar type ? ?Collaboration of Care: Medication Management AEB Therapist sent the clients nurse and psychiatrist a message regarding the clients changes in symptoms and inability to obtain her medications from the pharmacy for them to follow up. ? ?Patient/Guardian was advised Release of Information must be obtained prior to any record release in order to collaborate their care with an outside provider. Patient/Guardian was advised if they have not already done so to contact the registration department to sign all necessary forms in order for [place] to release information regarding their care.  ? ?Consent: Patient/Guardian gives verbal consent for treatment and assignment of benefits for services provided during this visit. Patient/Guardian expressed understanding and agreed to proceed.  ? ?[place], LCSW ?01/01/2022 ? ?

## 2022-01-01 NOTE — Telephone Encounter (Signed)
Writer called pharmacy as chart indicates she should have rx for all of her medicine but the Klonopin. Called her pharmacy and she last filled it on 11/28/21. She has an appt with Ce Ce NP in 2 days. Will forward this information for her to decide how to go forward. ?

## 2022-01-02 NOTE — Plan of Care (Signed)
?  Problem: Bipolar Disorder CCP Problem  1  Goal: STG: [place]a WILL ATTEND AT LEAST 80% OF SCHEDULED FOLLOW-UP MEDICATION MANAGEMENT APPOINTMENTS Outcome: Progressing   

## 2022-01-03 ENCOUNTER — Ambulatory Visit (INDEPENDENT_AMBULATORY_CARE_PROVIDER_SITE_OTHER): Payer: Medicaid Other | Admitting: Psychiatry

## 2022-01-03 DIAGNOSIS — F411 Generalized anxiety disorder: Secondary | ICD-10-CM

## 2022-01-03 DIAGNOSIS — F250 Schizoaffective disorder, bipolar type: Secondary | ICD-10-CM

## 2022-01-03 MED ORDER — HYDROXYZINE HCL 50 MG PO TABS: ORAL_TABLET | ORAL | 0 refills | Status: DC

## 2022-01-03 MED ORDER — CLONAZEPAM 0.5 MG PO TABS: 0.5000 mg | ORAL_TABLET | Freq: Every day | ORAL | 0 refills | Status: DC | PRN

## 2022-01-03 MED ORDER — OLANZAPINE 15 MG PO TBDP: 15.0000 mg | ORAL_TABLET | Freq: Every day | ORAL | 0 refills | Status: DC

## 2022-01-03 MED ORDER — DULOXETINE HCL 40 MG PO CPEP: 40.0000 mg | ORAL_CAPSULE | Freq: Every day | ORAL | 0 refills | Status: DC

## 2022-01-03 MED ORDER — BENZTROPINE MESYLATE 1 MG PO TABS: ORAL_TABLET | ORAL | 0 refills | Status: DC

## 2022-01-03 NOTE — Progress Notes (Signed)
BH MD/PA/NP OP Progress Note ? ?01/03/2022 7:58 PM ?Michaela Wells  ?MRN:  [id number] ? ?Virtual Visit via Telephone Note ? ?I connected with Michaela Wells on 01/03/22 at  5:00 PM EDT by telephone and verified that I am speaking with the correct person using two identifiers. ? ?Location: ?Patient: home ?Provider: offsite ?  ?I discussed the limitations, risks, security and privacy concerns of performing an evaluation and management service by telephone and the availability of in person appointments. I also discussed with the patient that there may be a patient responsible charge related to this service. The patient expressed understanding and agreed to proceed. ? ? ?  ?I discussed the assessment and treatment plan with the patient. The patient was provided an opportunity to ask questions and all were answered. The patient agreed with the plan and demonstrated an understanding of the instructions. ?  ?The patient was advised to call back or seek an in-person evaluation if the symptoms worsen or if the condition fails to improve as anticipated. ? ?I provided 30 minutes of non-face-to-face time during this encounter. ? ? ?Mcneil Sober, NP  ? ?Chief Complaint: Medication management ? ?HPI: Michaela Wells is a 61 year old female presented to [facility] behavioral health outpatient for follow-up psychiatric evaluation.  She has a psychiatric history of generalized anxiety disorder, PTSD, depression, schizoaffective disorder bipolar type, marijuana use, and alcohol dependence uncomplicated.  Patient symptoms are managed with Zyprexa 15 mg at bedtime, hydroxyzine 50 mg 3 times daily as needed for anxiety, duloxetine 40 mg daily, Klonopin 0.5 mg twice daily and Cogentin 1 mg twice daily.  Patient reports that she has been out of her duloxetine and Klonopin since April.  She reports insomnia recently.  Patient later stated that she picked up her Klonopin in April and depleted her supply at the beginning of May.Today she  request medication refills.   ? ?Patient is alert and oriented x4 but very forgetful.  She reports that her memory is of concern and she loses focus multiple times, stating that "my memory gets wiped out and I go blank".  Patient is a poor historian, stating that she recently fell and was hospitalized.  Looking back at patient's record, fall was not recent.  Patient reports smoking marijuana daily, stating that she tried to stop in September but started having visual hallucinations.  Medication education conducted and patient acknowledged understanding that long-term use of benzodiazepines can affect memory.  Patient recommended to taper down Klonopin.  Patient made aware that Klonopin will be refilled today but at a lower dose to initiate down taper.  Patient in agreement with plan. ? ? ?Visit Diagnosis:  ?  ICD-10-CM   ?1. Schizoaffective disorder, bipolar type (HCC)  F25.0 benztropine (COGENTIN) 1 MG tablet  ?  OLANZapine zydis (ZYPREXA) 15 MG disintegrating tablet  ?  ?2. GAD (generalized anxiety disorder)  F41.1 clonazePAM (KLONOPIN) 0.5 MG tablet  ?  hydrOXYzine (ATARAX) 50 MG tablet  ?  ? ? ?Past Psychiatric History:  ? ? ?Past Medical History:  ?Past Medical History:  ?Diagnosis Date  ? Bronchial asthma   ? Chicken pox   ? COPD (chronic obstructive pulmonary disease) (HCC)   ? Depression   ? Frequent headaches   ? GERD (gastroesophageal reflux disease)   ? UTI (lower urinary tract infection)   ?  ?Past Surgical History:  ?Procedure Laterality Date  ? ESOPHAGEAL DILATION    ? TONSILLECTOMY    ? ? ?Family Psychiatric History:  ? ?  Family History:  ?Family History  ?Problem Relation Age of Onset  ? Other Mother   ?     MVA-Deceased [pt was age 51]  ? COPD Father   ?     Deceased  ? Heart attack Maternal Grandmother   ? Stroke Maternal Grandmother   ? Brain cancer Maternal Grandmother   ? Arthritis/Rheumatoid Maternal Grandmother   ? Heart attack Maternal Grandfather   ? Stroke Maternal Grandfather   ? Heart  disease Maternal Aunt   ? Stroke Maternal Aunt   ?     #1  ? Heart attack Maternal Aunt   ?     #1  ? Hypertension Maternal Aunt   ?     #1  ? Arthritis/Rheumatoid Sister   ? COPD Sister   ? Heart disease Maternal Uncle   ? Heart attack Maternal Uncle   ?     x4  ? Heart disease Brother   ?     #1  ? Heart attack Brother   ?     #1  ? Diabetes Brother   ? Diabetes Maternal Aunt   ? Mental retardation Maternal Aunt   ? Healthy Daughter   ?     x2  ? Drug abuse Daughter   ?     #1  ? Drug abuse Daughter   ?     #2  ? Kidney Stones Daughter   ?     #2  ? Other Son   ?     Agoraphobia  ? ? ?Social History:  ?Social History  ? ?Socioeconomic History  ? Marital status: Single  ?  Spouse name: Not on file  ? Number of children: Not on file  ? Years of education: Not on file  ? Highest education level: Not on file  ?Occupational History  ? Not on file  ?Tobacco Use  ? Smoking status: Every Day  ? Smokeless tobacco: Never  ?Substance and Sexual Activity  ? Alcohol use: Yes  ? Drug use: Not Currently  ? Sexual activity: Not Currently  ?Other Topics Concern  ? Not on file  ?Social History Narrative  ? Right handed   ? Caffeine- 12 pack of mountain dew   ? Lives at home with 2 daughters and 3 grandkids  ? ?Social Determinants of Health  ? ?Financial Resource Strain: Not on file  ?Food Insecurity: Not on file  ?Transportation Needs: Not on file  ?Physical Activity: Not on file  ?Stress: Not on file  ?Social Connections: Not on file  ? ? ?Allergies:  ?Allergies  ?Allergen Reactions  ? Penicillins Other (See Comments)  ?  Childhood allergy- Reaction?? ?Has patient had a PCN reaction causing immediate rash, facial/tongue/throat swelling, SOB or lightheadedness with hypotension: Unknown ?Has patient had a PCN reaction causing severe rash involving mucus membranes or skin necrosis: Unknown ?Has patient had a PCN reaction that required hospitalization: Unknown ?Has patient had a PCN reaction occurring within the last 10 years:  Unknown ?If all of the above answers are "NO", then may proceed with Cephalosporin use. ?   ? ? ?Metabolic Disorder Labs: ?Lab Results  ?Component Value Date  ? HGBA1C 5.3 07/13/2021  ? MPG 145.59 05/06/2018  ? ?No results found for: PROLACTIN ?Lab Results  ?Component Value Date  ? CHOL 203 (H) 05/06/2018  ? TRIG 73 05/06/2018  ? HDL 51 05/06/2018  ? CHOLHDL 4.0 05/06/2018  ? VLDL 15 05/06/2018  ? LDLCALC 137 (H) 05/06/2018  ? ?  Lab Results  ?Component Value Date  ? TSH 3.501 02/27/2021  ? TSH 3.388 10/23/2019  ? ? ?Therapeutic Level Labs: ?No results found for: LITHIUM ?Lab Results  ?Component Value Date  ? VALPROATE 56 03/08/2021  ? VALPROATE 132 (H) 03/06/2021  ? ?No components found for:  CBMZ ? ?Current Medications: ?Current Outpatient Medications  ?Medication Sig Dispense Refill  ? albuterol (VENTOLIN HFA) 108 (90 Base) MCG/ACT inhaler INHALE 1 PUFF INTO THE LUNGS EVERY 4 HOURS AS NEEDED FOR WHEEZING OR SHORTNESS OF BREATH 8.5 g 1  ? ANORO ELLIPTA 62.5-25 MCG/ACT AEPB INHALE 1 PUFF INTO THE LUNGS DAILY 30 each 2  ? benztropine (COGENTIN) 1 MG tablet TAKE 1 TABLET(1 MG) BY MOUTH TWICE DAILY 60 tablet 0  ? clonazePAM (KLONOPIN) 0.5 MG tablet Take 1 tablet (0.5 mg total) by mouth daily as needed for anxiety. 30 tablet 0  ? DULoxetine HCl 40 MG CPEP Take 40 mg by mouth daily. 30 capsule 0  ? fluticasone (FLONASE) 50 MCG/ACT nasal spray Place 2 sprays into both nostrils daily.    ? hydrOXYzine (ATARAX) 50 MG tablet TAKE 1 TABLET(50 MG) BY MOUTH THREE TIMES DAILY AS NEEDED 90 tablet 0  ? losartan (COZAAR) 25 MG tablet Take 1 tablet (25 mg total) by mouth daily. 90 tablet 0  ? metoprolol succinate (TOPROL-XL) 25 MG 24 hr tablet Take 1 tablet (25 mg total) by mouth daily. 90 tablet 0  ? nicotine (NICODERM CQ - DOSED IN MG/24 HOURS) 21 mg/24hr patch Place 1 patch (21 mg total) onto the skin daily. 28 patch 0  ? OLANZapine zydis (ZYPREXA) 15 MG disintegrating tablet Take 1 tablet (15 mg total) by mouth at bedtime. 30  tablet 0  ? ?No current facility-administered medications for this visit.  ? ? ? ?Musculoskeletal: ?Strength & Muscle Tone: n/a virtual visit ?Gait & Station: n/a ?Patient leans: n/a ? ?Psychiatric Specialty Exam: ?R

## 2022-01-04 ENCOUNTER — Telehealth (HOSPITAL_COMMUNITY): Payer: Self-pay | Admitting: *Deleted

## 2022-01-04 NOTE — Telephone Encounter (Signed)
Repeated fax request from pharmacy re patients duloxetine. Reviewed chart and 40 mg sent in on 5/11 after she was seen by Ce Ce Np. Pharmacy says she has never picked the 40 mg up so last took 20mg . Will notify Ce Ce NP of this but she does not work on fri so it wont be noted till Monday.  ?

## 2022-01-07 ENCOUNTER — Other Ambulatory Visit (HOSPITAL_COMMUNITY): Payer: Self-pay | Admitting: Psychiatry

## 2022-01-07 MED ORDER — DULOXETINE HCL 20 MG PO CPEP: 40.0000 mg | ORAL_CAPSULE | Freq: Every day | ORAL | 0 refills | Status: DC

## 2022-01-07 NOTE — Progress Notes (Signed)
Cymbalta changed to 20 mg x 2 capsules to equal same dose of 40 mg due to patient's insurance not covering  the 40 mg capsule option. ?

## 2022-01-15 ENCOUNTER — Other Ambulatory Visit (HOSPITAL_COMMUNITY): Payer: Self-pay | Admitting: Psychiatry

## 2022-01-15 DIAGNOSIS — F411 Generalized anxiety disorder: Secondary | ICD-10-CM

## 2022-01-15 NOTE — Telephone Encounter (Signed)
At patient's last visit she was informed that Klonopin would be reduced to 0.5 mg daily.  Medications reduced today.

## 2022-01-17 ENCOUNTER — Other Ambulatory Visit: Payer: Self-pay | Admitting: Family

## 2022-01-17 ENCOUNTER — Other Ambulatory Visit (HOSPITAL_COMMUNITY): Payer: Self-pay | Admitting: Psychiatry

## 2022-01-17 ENCOUNTER — Telehealth (HOSPITAL_COMMUNITY): Payer: Self-pay | Admitting: *Deleted

## 2022-01-17 DIAGNOSIS — F250 Schizoaffective disorder, bipolar type: Secondary | ICD-10-CM

## 2022-01-17 DIAGNOSIS — I10 Essential (primary) hypertension: Secondary | ICD-10-CM

## 2022-01-17 MED ORDER — OLANZAPINE 20 MG PO TBDP: 20.0000 mg | ORAL_TABLET | Freq: Every day | ORAL | 3 refills | Status: DC

## 2022-01-17 NOTE — Telephone Encounter (Signed)
Patient called to say the NP she saw last time only gave her 30 pills of her Klonopin and she doesn't think it should be the temporary NP's decision to change her medicines but it should be Brittneys. She states she needs her Klonopin BID. Ce Ce NP note says at her last visit she would taper her down and it is meant to be one pill a day. She is out of her medicine now. Will refer this concern to [facility] NP who is now back from her leave.

## 2022-01-17 NOTE — Telephone Encounter (Signed)
Patient informed Clinical research associate she continues to have hallucinations of neighbors in a different state.  She reports that she continues to be paranoid that they are trying to harm her.  She request an increase in Klonopin.  Provider informed patient that Klonopin would not be increased but will continue to be tapered over time.  Provider suggested increasing Zyprexa to 20 mg which she was agreeable to.  Medication sent to preferred pharmacy.

## 2022-01-31 ENCOUNTER — Telehealth (INDEPENDENT_AMBULATORY_CARE_PROVIDER_SITE_OTHER): Payer: Medicaid Other | Admitting: Psychiatry

## 2022-01-31 ENCOUNTER — Encounter (HOSPITAL_COMMUNITY): Payer: Self-pay | Admitting: Psychiatry

## 2022-01-31 DIAGNOSIS — F411 Generalized anxiety disorder: Secondary | ICD-10-CM | POA: Diagnosis not present

## 2022-01-31 DIAGNOSIS — F250 Schizoaffective disorder, bipolar type: Secondary | ICD-10-CM | POA: Diagnosis not present

## 2022-01-31 MED ORDER — BENZTROPINE MESYLATE 1 MG PO TABS: ORAL_TABLET | ORAL | 3 refills | Status: DC

## 2022-01-31 MED ORDER — HYDROXYZINE HCL 50 MG PO TABS
ORAL_TABLET | ORAL | 3 refills | Status: DC
Start: 2022-01-31 — End: 2022-07-29

## 2022-01-31 MED ORDER — GABAPENTIN 300 MG PO CAPS: 300.0000 mg | ORAL_CAPSULE | Freq: Three times a day (TID) | ORAL | 2 refills | Status: DC

## 2022-01-31 MED ORDER — ZIPRASIDONE HCL 20 MG PO CAPS: 20.0000 mg | ORAL_CAPSULE | Freq: Two times a day (BID) | ORAL | 3 refills | Status: DC

## 2022-01-31 NOTE — Progress Notes (Signed)
[facility] MD/PA/NP OP Progress Note Virtual Visit via Video Note  I connected with Michaela Wells on 01/31/22 at  3:00 PM EDT by a video enabled telemedicine application and verified that I am speaking with the correct person using two identifiers.  Location: Patient: Home Provider: Clinic   I discussed the limitations of evaluation and management by telemedicine and the availability of in person appointments. The patient expressed understanding and agreed to proceed.  I provided 30 minutes of non-face-to-face time during this encounter.          01/31/2022 3:28 PM Michaela Wells  MRN:  [id number]  Chief Complaint: "I feel like I'm going crazy"  HPI: 61 year old female seen today for follow-up psychiatric evaluation.  She has a psychiatric history of schizoaffective disorder, bipolar disorder, marijuana use, anxiety, and depression. She is currently she is on Zyprexa 20 mg nightly, Cogentin 1 mg twice daily, Cymbalta 20 mg daily, Klonopin 0.5 mg daily, NicoDerm CQ 21 mg patches daily. Today patient informed writer that her medications are somewhat effective in managing her psychiatric conditions.    Today she was pleasant, cooperative, and engaged in conversation.  She informed Probation officer that she feels like she's going crazy. She notes that she saw a circus in her yard as well as her deceased dog in a field of wheat. She also notes that she saw a small child and continues to hear her neighbors. Patient notes her sleep has peen poor. She reports that her neighbor/AH interfere with her sleep.  She does endorse sleeping approximately 8 hours but notes it is disturbed as she wakes up at least twice.   Patient informed writer she continues to be irritable, distractible, and have racing thoughts.  He notes that he has been without her duloxetine for a few weeks.  Patient also informed that she continues to have confusion.  Writer informed patient that Klonopin could cause confusion and inform her that  we will be discontinuing today.  She was agreeable to this.   Patient also reports that her anxiety and depression continues to be bothersome.  Provider conducted a GAD-7 and patient scored a 20.  To cope with her stress patient notes that she smokes marijuana.  She informed Probation officer that she reduced her consumption as she has not been able to afford it.  Provider informed patient that marijuana can exacerbate her mental health.  She endorsed understanding and agreed.  Patient reports that Zyprexa is ineffective and is agreeable to trying Geodon.  She was instructed to continue Zyprexa 10 mg for a week and then discontinue.  She will start Geodon 20 mg twice daily to help manage symptoms of psychosis.  Patient will also start gabapentin 300 mg 3 times daily to help manage anxiety, depression, and mood. Potential side effects of medication and risks vs benefits of treatment vs non-treatment were explained and discussed. All questions were answered. At this time duloxetine and Klonopin discontinued.  No other concerns at this time.  Visit Diagnosis:    ICD-10-CM   1. Schizoaffective disorder, bipolar type ([address])  F25.0 ziprasidone (GEODON) 20 MG capsule    benztropine (COGENTIN) 1 MG tablet    2. GAD (generalized anxiety disorder)  F41.1 gabapentin (NEURONTIN) 300 MG capsule    hydrOXYzine (ATARAX) 50 MG tablet       Past Psychiatric History:  schizoaffective disorder, bipolar disorder, anxiety, and depression  Past Medical History:  Past Medical History:  Diagnosis Date   Bronchial asthma    Chicken  pox    COPD (chronic obstructive pulmonary disease) (HCC)    Depression    Frequent headaches    GERD (gastroesophageal reflux disease)    UTI (lower urinary tract infection)     Past Surgical History:  Procedure Laterality Date   ESOPHAGEAL DILATION     TONSILLECTOMY      Family Psychiatric History:  Daughter Bipolar and SA, Daughter anxiety, Son anxiety  Family History:  Family  History  Problem Relation Age of Onset   Other Mother        MVA-Deceased [pt was age 46]   COPD Father        Deceased   Heart attack Maternal Grandmother    Stroke Maternal Grandmother    Brain cancer Maternal Grandmother    Arthritis/Rheumatoid Maternal Grandmother    Heart attack Maternal Grandfather    Stroke Maternal Grandfather    Heart disease Maternal Aunt    Stroke Maternal Aunt        #1   Heart attack Maternal Aunt        #1   Hypertension Maternal Aunt        #1   Arthritis/Rheumatoid Sister    COPD Sister    Heart disease Maternal Uncle    Heart attack Maternal Uncle        x4   Heart disease Brother        #1   Heart attack Brother        #1   Diabetes Brother    Diabetes Maternal Aunt    Mental retardation Maternal Aunt    Healthy Daughter        x2   Drug abuse Daughter        #1   Drug abuse Daughter        #2   Kidney Stones Daughter        #2   Other Son        Agoraphobia    Social History:  Social History   Socioeconomic History   Marital status: Single    Spouse name: Not on file   Number of children: Not on file   Years of education: Not on file   Highest education level: Not on file  Occupational History   Not on file  Tobacco Use   Smoking status: Every Day   Smokeless tobacco: Never  Substance and Sexual Activity   Alcohol use: Yes   Drug use: Not Currently   Sexual activity: Not Currently  Other Topics Concern   Not on file  Social History Narrative   Right handed    Caffeine- 12 pack of mountain dew    Lives at home with 2 daughters and 3 grandkids   Social Determinants of Health   Financial Resource Strain: Not on file  Food Insecurity: Not on file  Transportation Needs: Not on file  Physical Activity: Not on file  Stress: Not on file  Social Connections: Not on file    Allergies:  Allergies  Allergen Reactions   Penicillins Other (See Comments)    Childhood allergy- Reaction?? Has patient had a PCN  reaction causing immediate rash, facial/tongue/throat swelling, SOB or lightheadedness with hypotension: Unknown Has patient had a PCN reaction causing severe rash involving mucus membranes or skin necrosis: Unknown Has patient had a PCN reaction that required hospitalization: Unknown Has patient had a PCN reaction occurring within the last 10 years: Unknown If all of the above answers are "NO", then may proceed with  Cephalosporin use.      Metabolic Disorder Labs: Lab Results  Component Value Date   HGBA1C 5.3 07/13/2021   MPG 145.59 05/06/2018   No results found for: "PROLACTIN" Lab Results  Component Value Date   CHOL 203 (H) 05/06/2018   TRIG 73 05/06/2018   HDL 51 05/06/2018   CHOLHDL 4.0 05/06/2018   VLDL 15 05/06/2018   LDLCALC 137 (H) 05/06/2018   Lab Results  Component Value Date   TSH 3.501 02/27/2021   TSH 3.388 10/23/2019    Therapeutic Level Labs: No results found for: "LITHIUM" Lab Results  Component Value Date   VALPROATE 56 03/08/2021   VALPROATE 132 (H) 03/06/2021   No results found for: "CBMZ"  Current Medications: Current Outpatient Medications  Medication Sig Dispense Refill   gabapentin (NEURONTIN) 300 MG capsule Take 1 capsule (300 mg total) by mouth 3 (three) times daily. 90 capsule 2   ziprasidone (GEODON) 20 MG capsule Take 1 capsule (20 mg total) by mouth 2 (two) times daily with a meal. 60 capsule 3   albuterol (VENTOLIN HFA) 108 (90 Base) MCG/ACT inhaler INHALE 1 PUFF INTO THE LUNGS EVERY 4 HOURS AS NEEDED FOR WHEEZING OR SHORTNESS OF BREATH 8.5 g 1   ANORO ELLIPTA 62.5-25 MCG/ACT AEPB INHALE 1 PUFF INTO THE LUNGS DAILY 30 each 2   benztropine (COGENTIN) 1 MG tablet TAKE 1 TABLET(1 MG) BY MOUTH TWICE DAILY 60 tablet 3   fluticasone (FLONASE) 50 MCG/ACT nasal spray Place 2 sprays into both nostrils daily.     hydrOXYzine (ATARAX) 50 MG tablet TAKE 1 TABLET(50 MG) BY MOUTH THREE TIMES DAILY AS NEEDED 90 tablet 3   losartan (COZAAR) 25 MG  tablet TAKE 1 TABLET(25 MG) BY MOUTH DAILY 90 tablet 0   metoprolol succinate (TOPROL-XL) 25 MG 24 hr tablet TAKE 1 TABLET(25 MG) BY MOUTH DAILY 90 tablet 0   nicotine (NICODERM CQ - DOSED IN MG/24 HOURS) 21 mg/24hr patch Place 1 patch (21 mg total) onto the skin daily. 28 patch 0   No current facility-administered medications for this visit.     Musculoskeletal: Strength & Muscle Tone: within normal limits Gait & Station: normal Patient leans: N/A  Psychiatric Specialty Exam: Review of Systems  There were no vitals taken for this visit.There is no height or weight on file to calculate BMI.  General Appearance: Well Groomed  Eye Contact:  Good  Speech:  Clear and Coherent and Normal Rate  Volume:  Normal  Mood:  Anxious and Depressed  Affect:  Appropriate and Congruent  Thought Process:  Coherent, Goal Directed and Linear  Orientation:  Full (Time, Place, and Person)  Thought Content: Logical, Hallucinations: Auditory, Ideas of Reference:   Paranoia Delusions and Paranoid Ideation   Suicidal Thoughts:  No  Homicidal Thoughts:  No  Memory:  Immediate;   Good Recent;   Good Remote;   Good  Judgement:  Good  Insight:  Fair  Psychomotor Activity:  Normal  Concentration:  Concentration: Good and Attention Span: Good  Recall:  Good  Fund of Knowledge: Good  Language: Good  Akathisia:  No  Handed:  Right  AIMS (if indicated): Not done  Assets:  Communication Skills Desire for Improvement Financial Resources/Insurance Housing Leisure Time Social Support  ADL's:  Intact  Cognition: WNL  Sleep:  Fair   Screenings: [facility] Admission (Discharged) from OP Visit from 10/19/2019 in [place] [facility]  AIMS Total Score 0 0      AUDIT    Flowsheet Row Admission (Discharged) from OP Visit from 10/19/2019 in [place]  [facility] Admission (Discharged) from 05/05/2018 in [place] [facility]  Alcohol Use Disorder Identification Test Final Score (AUDIT) 5 4      GAD-7    Flowsheet Row Video Visit from [facility]/31/2022 in [facility] Video Visit from 10/02/2021 in [facility] Video Visit from 07/10/2021 in [facility] Video Visit from 04/09/2021 in [facility] Video Visit from 02/05/2021 in [facility]  Total GAD-7 Score 20 21 21 21 20       PHQ2-9    Flowsheet Row Video Visit from 01/31/2022 in [facility] Video Visit from 10/02/2021 in [facility] Office Visit from 09/11/2021 in [facility] at [place] Visit from 07/13/2021 in [facility] at [facility] Video Visit from 07/10/2021 in [facility]  PHQ-2 Total Score 5 6 0 0 5  PHQ-9 Total Score 19 23 -- -- 21      Flowsheet Row Video Visit from 04/09/2021 in [facility] ED to Hosp-Admission (Discharged) from 03/04/2021 in [place] ED to Hosp-Admission (Discharged) from 02/26/2021 in [place]  C-SSRS RISK CATEGORY Error: Q7 should not be populated when Q6 is No No Risk No Risk        Assessment and Plan: Patient endorses symptoms of anxiety, depression, AH, and paranoia.  Patient reports that Zyprexa is ineffective and is agreeable to trying Geodon.  Shee was instructed to continue Zyprexa 10 mg for a week and then discontinue.  She will start Geodon 20 mg twice daily to help manage symptoms of psychosis.  Patient will also start gabapentin 300 mg 3 times daily to help manage anxiety, depression, and mood.  At this time duloxetine and Klonopin discontinued.  1. Schizoaffective disorder, bipolar type ([place])  Start-  ziprasidone (GEODON) 20 MG capsule; Take 1 capsule (20 mg total) by mouth 2 (two) times daily with a meal.  Dispense: 60 capsule; Refill: 3 Continue- benztropine (COGENTIN) 1 MG tablet; TAKE 1 TABLET(1 MG) BY MOUTH TWICE DAILY  Dispense: 60 tablet; Refill: 3  2. GAD (generalized anxiety disorder)  Start- gabapentin (NEURONTIN) 300 MG capsule; Take 1 capsule (300 mg total) by mouth 3 (three) times daily.  Dispense: 90 capsule; Refill: 2 Continue- hydrOXYzine (ATARAX) 50 MG tablet; TAKE 1 TABLET(50 MG) BY MOUTH THREE TIMES DAILY AS NEEDED  Dispense: 90 tablet; Refill: 3   Follow-up in 3 months Follow-up with   Follow up in 3 months Follow up with therapy   Salley Slaughter, NP 01/31/2022, 3:28 PM

## 2022-02-15 ENCOUNTER — Other Ambulatory Visit (HOSPITAL_COMMUNITY): Payer: Self-pay | Admitting: Psychiatry

## 2022-02-15 ENCOUNTER — Other Ambulatory Visit: Payer: Self-pay | Admitting: Family

## 2022-02-15 DIAGNOSIS — F411 Generalized anxiety disorder: Secondary | ICD-10-CM

## 2022-02-15 DIAGNOSIS — I10 Essential (primary) hypertension: Secondary | ICD-10-CM

## 2022-03-14 ENCOUNTER — Ambulatory Visit (INDEPENDENT_AMBULATORY_CARE_PROVIDER_SITE_OTHER): Payer: Medicaid Other | Admitting: Clinical

## 2022-03-14 DIAGNOSIS — F250 Schizoaffective disorder, bipolar type: Secondary | ICD-10-CM

## 2022-03-16 NOTE — Plan of Care (Signed)
  Problem: Bipolar Disorder CCP Problem  1  Goal: STG: Michaela Wells WILL ATTEND AT LEAST 80% OF SCHEDULED FOLLOW-UP MEDICATION MANAGEMENT APPOINTMENTS Outcome: Progressing

## 2022-03-16 NOTE — Progress Notes (Signed)
THERAPIST PROGRESS NOTE Virtual Visit via Video Note  I connected with Michaela Wells on 03/14/2022 at  1:00 PM EDT by a video enabled telemedicine application and verified that I am speaking with the correct person using two identifiers.  Location: Patient: home Provider: office   I discussed the limitations of evaluation and management by telemedicine and the availability of in person appointments. The patient expressed understanding and agreed to proceed.   Follow Up Instructions: I discussed the assessment and treatment plan with the patient. The patient was provided an opportunity to ask questions and all were answered. The patient agreed with the plan and demonstrated an understanding of the instructions.   The patient was advised to call back or seek an in-person evaluation if the symptoms worsen or if the condition fails to improve as anticipated.   Session Time: 30 minutes  Participation Level: Active  Behavioral Response: CasualAlertEuthymic  Type of Therapy: Individual Therapy  Treatment Goals addressed: client will attend at least 80% of scheduled follow up medication management appointments  ProgressTowards Goals: Progressing  Interventions: CBT  Summary:  Michaela Wells is a 60 y.o. female who presents for the scheduled session oriented times five, appropriately dressed, and friendly. Client reported hallucinations and delusions. Client reported on today she is doing slightly better than she was at the last appointment. Client reported in June this year she went out of town with her family but she forgot her medications. Client reported she also stopped smoking marijuana. Client reported she told her psychiatrist that it's been a month since shes taken her psych medications. Client reported she noticed the odd hallucinations she was having such as seeing a circus and a family have subsided. Client reported she does want to switch back to seroqel and klonopin  because it helped with her anxiety and panic in the past. Client reported she still hears "the neighbors" but she feels more "normal" since she stopped her medication.  Evidence of progress towards goal:  client reported 1 positive coping skill which is listening to white noise at night.    Suicidal/Homicidal: Nowithout intent/plan  Therapist Response:  Therapist began the appointment asking the client how she has been doing since she was last seen. Therapist used CBT to engage using active listening and positive emotional support. Therapist used CBT to engage and ask the client about medication compliance and side effects. Therapist used CBT to engage and ask the client about frequency of psychotic symptoms and severity. Therapist used CBT to engage and ask the client questions that are reality based compared to the hallucinations she experiences. Therapist used CBT ask the client to identify her progress with frequency of use with coping skills with continued practice in her daily activity.    Therapist assigned the client homework to listen to her relaxation music to help her sleep during the night.    Plan: Return again in 5 weeks.  Diagnosis: schizoaffective disorder, bipolar type  Collaboration of Care: Patient refused AEB none.  Patient/Guardian was advised Release of Information must be obtained prior to any record release in order to collaborate their care with an outside provider. Patient/Guardian was advised if they have not already done so to contact the registration department to sign all necessary forms in order for [place] to release information regarding their care.   Consent: Patient/Guardian gives verbal consent for treatment and assignment of benefits for services provided during this visit. Patient/Guardian expressed understanding and agreed to proceed.   Neena Rhymes  Roshanda Balazs, LCSW 03/14/2022

## 2022-04-04 ENCOUNTER — Ambulatory Visit (INDEPENDENT_AMBULATORY_CARE_PROVIDER_SITE_OTHER): Payer: Medicaid Other | Admitting: Clinical

## 2022-04-04 DIAGNOSIS — F250 Schizoaffective disorder, bipolar type: Secondary | ICD-10-CM | POA: Diagnosis not present

## 2022-04-04 NOTE — Progress Notes (Signed)
THERAPIST PROGRESS NOTE Virtual Visit via Video Note  I connected with Michaela Wells on 04/04/2022 at  1:00 PM EDT by a video enabled telemedicine application and verified that I am speaking with the correct person using two identifiers.  Location: Patient: home Provider: office   I discussed the limitations of evaluation and management by telemedicine and the availability of in person appointments. The patient expressed understanding and agreed to proceed.   Follow Up Instructions: I discussed the assessment and treatment plan with the patient. The patient was provided an opportunity to ask questions and all were answered. The patient agreed with the plan and demonstrated an understanding of the instructions.   The patient was advised to call back or seek an in-person evaluation if the symptoms worsen or if the condition fails to improve as anticipated.   Session Time: 30 minutes  Participation Level: Active  Behavioral Response: CasualAlertDepressed  Type of Therapy: Individual Therapy  Treatment Goals addressed: client will attend 80% of scheduled follow up medication management appointments   ProgressTowards Goals: Not Progressing  Interventions: CBT and Supportive  Summary:  Michaela Wells is a 61 y.o. female who presents for the scheduled appointment oriented times five, appropriately dressed, and friendly. Client reported hallucinations and delusions. Client reported she has not been able to sleep. Client reported she has continued to stay off of her psychiatric medications. Client reported she stopped taking them because her kids said she was being mean. Client reported she has noticed her visual hallucinations have improved but still experiences auditory hallucinations.  Client reported auditory hallucinations are still from her previous neighbors when she lived in [place]. Client reported about 2 Saturdays ago she had the police at the neighbor's house in [place].   Client reported the police allegedly asked them to get rid of the devices that they were using to listen to her.  Client reported later that same day she had a voice telling her that they did not get rid of all the equipment they were using to listen to her and she became depressed because she thought she would be free.  Client reported laying in bed most of the day.  Client reported she does not feel like herself when she takes her psychiatric medications but feels that she needs something to help with her anxiety and so she can sleep through the night.  Client reported her family looks after her but she feels depressed feeling like a burden that they have to take care of her because of her mental health. Evidence of progress towards goal: Client reported 1 positive behavioral activation technique of getting out of the house at least once every other week with a family member.      Suicidal/Homicidal: Nowithout intent/plan  Therapist Response:  Therapist began the appointment asking the client how she has been doing since last seen. Therapist used CBT to engage using active listening and positive emotional support. Therapist used CBT to ask the client about medication management compliance. Therapist used CBT to engage and ask the client about severity of hallucinations and delusions and her ability to tell what is reality. Therapist used CBT to discuss behavioral activation activities to help with depression. Therapist used CBT ask the client to identify her progress with frequency of use with coping skills with continued practice in her daily activity.    Therapist assigned the client homework to engage in positive activity at least 1x per day.    Plan: Return again in 4  weeks.  Diagnosis: schizoaffective disorder, bipolar type  Collaboration of Care: Patient refused AEB none requested by the client.  Patient/Guardian was advised Release of Information must be obtained prior to any record  release in order to collaborate their care with an outside provider. Patient/Guardian was advised if they have not already done so to contact the registration department to sign all necessary forms in order for [place] to release information regarding their care.   Consent: Patient/Guardian gives verbal consent for treatment and assignment of benefits for services provided during this visit. Patient/Guardian expressed understanding and agreed to proceed.   Neena Rhymes Audrina Marten, LCSW 04/04/2022

## 2022-04-06 NOTE — Plan of Care (Signed)
  Problem: Bipolar Disorder CCP Problem  1  Goal: STG: Michaela Wells WILL ATTEND AT LEAST 80% OF SCHEDULED FOLLOW-UP MEDICATION MANAGEMENT APPOINTMENTS Outcome: Progressing

## 2022-04-09 ENCOUNTER — Telehealth (HOSPITAL_COMMUNITY): Payer: Self-pay | Admitting: *Deleted

## 2022-04-09 ENCOUNTER — Other Ambulatory Visit (HOSPITAL_COMMUNITY): Payer: Self-pay | Admitting: Psychiatry

## 2022-04-09 DIAGNOSIS — F5105 Insomnia due to other mental disorder: Secondary | ICD-10-CM

## 2022-04-09 DIAGNOSIS — F411 Generalized anxiety disorder: Secondary | ICD-10-CM

## 2022-04-09 MED ORDER — DOXEPIN HCL 50 MG PO CAPS: 50.0000 mg | ORAL_CAPSULE | Freq: Every day | ORAL | 3 refills | Status: DC

## 2022-04-09 NOTE — Telephone Encounter (Signed)
VM asking me to call her as soon as possible. I called her and she is vague but states she has slept for the past month and done nothing because she doesn't feel like doing anything. She states she is not sure she is taking Geodon, which last progress note indicates she was started on because she didn't like the Zyprexa. She would like to speak with the provider which is Dr Doyne Keel. Will forward this request to her.

## 2022-04-09 NOTE — Telephone Encounter (Signed)
Patient informed Clinical research associate that she discontinued all of her psychiatric medication and now is having issues sleeping.  She notes that her confusion increases while on antipsychotics and reports that she does not want to restart one.  Patient informed Clinical research associate that she went for 9-day without sleeping.  She continues to endorse hallucinations and paranoia surrounding her neighbors.  Patient was agreeable to starting an antidepressant to help manage sleep, anxiety, and depression.  Doxepin 50 mg nightly started (patient has trialed trazodone, mirtazapine, gabapentin, Zyprexa, Geodon, Trilafon without success).  Potential side effects of medication and risks vs benefits of treatment vs non-treatment were explained and discussed. All questions were answered. 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  She was seen by neurology in 2022.  Provider spoke to patients daughter Clement Sayres [phone] who informed writer that her mother is going through a depressive state.  She also notes that her paranoia fluctuates.  Provider recommended that patient family encouraged her to follow-up with her PCP, neurologist, and writer for continued evaluation.  Zoe endorsed understanding and agreed.  No other concerns at this time.

## 2022-04-25 ENCOUNTER — Other Ambulatory Visit: Payer: Self-pay | Admitting: Family

## 2022-04-25 DIAGNOSIS — I10 Essential (primary) hypertension: Secondary | ICD-10-CM

## 2022-04-25 NOTE — Telephone Encounter (Signed)
Courtesy refill. Schedule appointment.  

## 2022-05-02 ENCOUNTER — Other Ambulatory Visit (HOSPITAL_COMMUNITY): Payer: Self-pay | Admitting: Psychiatry

## 2022-05-02 DIAGNOSIS — F250 Schizoaffective disorder, bipolar type: Secondary | ICD-10-CM

## 2022-05-06 ENCOUNTER — Telehealth (INDEPENDENT_AMBULATORY_CARE_PROVIDER_SITE_OTHER): Payer: Medicaid Other | Admitting: Psychiatry

## 2022-05-06 ENCOUNTER — Encounter (HOSPITAL_COMMUNITY): Payer: Self-pay | Admitting: Psychiatry

## 2022-05-06 DIAGNOSIS — F5105 Insomnia due to other mental disorder: Secondary | ICD-10-CM

## 2022-05-06 DIAGNOSIS — F250 Schizoaffective disorder, bipolar type: Secondary | ICD-10-CM | POA: Diagnosis not present

## 2022-05-06 DIAGNOSIS — F99 Mental disorder, not otherwise specified: Secondary | ICD-10-CM

## 2022-05-06 DIAGNOSIS — F411 Generalized anxiety disorder: Secondary | ICD-10-CM | POA: Diagnosis not present

## 2022-05-06 MED ORDER — OLANZAPINE 10 MG PO TABS: 10.0000 mg | ORAL_TABLET | Freq: Every day | ORAL | 3 refills | Status: DC

## 2022-05-06 MED ORDER — DOXEPIN HCL 50 MG PO CAPS: 50.0000 mg | ORAL_CAPSULE | Freq: Every day | ORAL | 3 refills | Status: DC

## 2022-05-06 MED ORDER — BENZTROPINE MESYLATE 1 MG PO TABS
ORAL_TABLET | ORAL | 3 refills | Status: DC
Start: 2022-05-06 — End: 2022-06-25

## 2022-05-06 NOTE — Progress Notes (Signed)
BH MD/PA/NP OP Progress Note Virtual Visit via Telephone Note  I connected with Michaela Wells on 05/06/22 at 11:30 AM EDT by telephone and verified that I am speaking with the correct person using two identifiers.  Location: Patient: home Provider: Clinic   I discussed the limitations, risks, security and privacy concerns of performing an evaluation and management service by telephone and the availability of in person appointments. I also discussed with the patient that there may be a patient responsible charge related to this service. The patient expressed understanding and agreed to proceed.   I provided 30 minutes of non-face-to-face time during this encounter.           05/06/2022 10:25 AM Michaela Wells  MRN:  [id number]  Chief Complaint: "For a week I haven't heard the voices"  HPI: 61 year old female seen today for follow-up psychiatric evaluation.  She has a psychiatric history of schizoaffective disorder, bipolar disorder, marijuana use, anxiety, and depression. She is currently she is on Doxepin 50 mg nightly. Today patient informed writer that her medications are somewhat effective in managing her psychiatric conditions.    Today she was pleasant, cooperative, and engaged in conversation.  She informed Clinical research associate that for the last week she has not heard voices. She notes that prior to this her voices scared her and showed her negative images of babies being raped.  Patient reports that she feels paranoid that her voices will come back as they are causing her bodily pains.  She quantifies her pain as an 8 out of 10.  She notes that she takes [facility] powder occasionally to help manage her pain.  Patient informed Clinical research associate that her daughter encouraged her to restart her antipsychotic to help manage her psychosis and paranoia.  Since her last visit she informed writer that her anxiety and depression has improved.  Provider conducted a GAD-7 and patient scored a 14, at her last visit she  scored a 20.  Provider also conducted PHQ-9 and patient scored a 17, at her last visit she scored 19.  Today she denies SI/HI/VAH.  She reports that her sleep fluctuates.  She endorses having adequate appetite.  Patient agreeable to restarting Zyprexa 10 mg to help manage symptoms of psychosis.  Cogentin 1 mg twice daily also restarted. She will continue doxepin as prescribed.  No other concerns at this time.    Visit Diagnosis:    ICD-10-CM   1. 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 Schizoaffective disorder, bipolar type (HCC)  F25.0 benztropine (COGENTIN) 1 MG tablet    OLANZapine (ZYPREXA) 10 MG tablet       Past Psychiatric History:  schizoaffective disorder, bipolar disorder, anxiety, and depression  Past Medical History:  Past Medical History:  Diagnosis Date   Bronchial asthma    Chicken pox    COPD (chronic obstructive pulmonary disease) (HCC)    Depression    Frequent headaches    GERD (gastroesophageal reflux disease)    UTI (lower urinary tract infection)     Past Surgical History:  Procedure Laterality Date   ESOPHAGEAL DILATION     TONSILLECTOMY      Family Psychiatric History:  Daughter Bipolar and SA, Daughter anxiety, Son anxiety  Family History:  Family History  Problem Relation Age of Onset   Other Mother  MVA-Deceased [pt was age 38]   COPD Father        Deceased   Heart attack Maternal Grandmother    Stroke Maternal Grandmother    Brain cancer Maternal Grandmother    Arthritis/Rheumatoid Maternal Grandmother    Heart attack Maternal Grandfather    Stroke Maternal Grandfather    Heart disease Maternal Aunt    Stroke Maternal Aunt        #1   Heart attack Maternal Aunt        #1   Hypertension Maternal Aunt        #1   Arthritis/Rheumatoid Sister    COPD Sister    Heart disease Maternal Uncle    Heart  attack Maternal Uncle        x4   Heart disease Brother        #1   Heart attack Brother        #1   Diabetes Brother    Diabetes Maternal Aunt    Mental retardation Maternal Aunt    Healthy Daughter        x2   Drug abuse Daughter        #1   Drug abuse Daughter        #2   Kidney Stones Daughter        #2   Other Son        Agoraphobia    Social History:  Social History   Socioeconomic History   Marital status: Single    Spouse name: Not on file   Number of children: Not on file   Years of education: Not on file   Highest education level: Not on file  Occupational History   Not on file  Tobacco Use   Smoking status: Every Day   Smokeless tobacco: Never  Substance and Sexual Activity   Alcohol use: Yes   Drug use: Not Currently   Sexual activity: Not Currently  Other Topics Concern   Not on file  Social History Narrative   Right handed    Caffeine- 12 pack of mountain dew    Lives at home with 2 daughters and 3 grandkids   Social Determinants of Health   Financial Resource Strain: Not on file  Food Insecurity: Not on file  Transportation Needs: Not on file  Physical Activity: Not on file  Stress: Not on file  Social Connections: Not on file    Allergies:  Allergies  Allergen Reactions   Penicillins Other (See Comments)    Childhood allergy- Reaction?? Has patient had a PCN reaction causing immediate rash, facial/tongue/throat swelling, SOB or lightheadedness with hypotension: Unknown Has patient had a PCN reaction causing severe rash involving mucus membranes or skin necrosis: Unknown Has patient had a PCN reaction that required hospitalization: Unknown Has patient had a PCN reaction occurring within the last 10 years: Unknown If all of the above answers are "NO", then may proceed with Cephalosporin use.      Metabolic Disorder Labs: Lab Results  Component Value Date   HGBA1C 5.3 07/13/2021   MPG 145.59 05/06/2018   No results found for:  "PROLACTIN" Lab Results  Component Value Date   CHOL 203 (H) 05/06/2018   TRIG 73 05/06/2018   HDL 51 05/06/2018   CHOLHDL 4.0 05/06/2018   VLDL 15 05/06/2018   LDLCALC 137 (H) 05/06/2018   Lab Results  Component Value Date   TSH 3.501 02/27/2021   TSH 3.388 10/23/2019    Therapeutic Level  Labs: No results found for: "LITHIUM" Lab Results  Component Value Date   VALPROATE 56 03/08/2021   VALPROATE 132 (H) 03/06/2021   No results found for: "CBMZ"  Current Medications: Current Outpatient Medications  Medication Sig Dispense Refill   OLANZapine (ZYPREXA) 10 MG tablet Take 1 tablet (10 mg total) by mouth at bedtime. 30 tablet 3   albuterol (VENTOLIN HFA) 108 (90 Base) MCG/ACT inhaler INHALE 1 PUFF INTO THE LUNGS EVERY 4 HOURS AS NEEDED FOR WHEEZING OR SHORTNESS OF BREATH 8.5 g 1   benztropine (COGENTIN) 1 MG tablet Take 1 mg twice daily 60 tablet 3   doxepin (SINEQUAN) 50 MG capsule Take 1 capsule (50 mg total) by mouth at bedtime. 30 capsule 3   fluticasone (FLONASE) 50 MCG/ACT nasal spray Place 2 sprays into both nostrils daily.     gabapentin (NEURONTIN) 300 MG capsule Take 1 capsule (300 mg total) by mouth 3 (three) times daily. 90 capsule 2   hydrOXYzine (ATARAX) 50 MG tablet TAKE 1 TABLET(50 MG) BY MOUTH THREE TIMES DAILY AS NEEDED 90 tablet 3   losartan (COZAAR) 25 MG tablet TAKE 1 TABLET(25 MG) BY MOUTH DAILY 30 tablet 0   metoprolol succinate (TOPROL-XL) 25 MG 24 hr tablet TAKE 1 TABLET(25 MG) BY MOUTH DAILY 90 tablet 0   nicotine (NICODERM CQ - DOSED IN MG/24 HOURS) 21 mg/24hr patch Place 1 patch (21 mg total) onto the skin daily. 28 patch 0   No current facility-administered medications for this visit.     Musculoskeletal: Strength & Muscle Tone: within normal limits Gait & Station: normal Patient leans: N/A  Psychiatric Specialty Exam: Review of Systems  There were no vitals taken for this visit.There is no height or weight on file to calculate BMI.   General Appearance: Well Groomed  Eye Contact:  Good  Speech:  Clear and Coherent and Normal Rate  Volume:  Normal  Mood:  Anxious and Depressed  Affect:  Appropriate and Congruent  Thought Process:  Coherent, Goal Directed and Linear  Orientation:  Full (Time, Place, and Person)  Thought Content: Logical, Ideas of Reference:   Paranoia Delusions, and Paranoid Ideation   Suicidal Thoughts:  No  Homicidal Thoughts:  No  Memory:  Immediate;   Good Recent;   Good Remote;   Good  Judgement:  Good  Insight:  Fair  Psychomotor Activity:   Unable to assess due to telephone visit  Concentration:  Concentration: Good and Attention Span: Good  Recall:  Good  Fund of Knowledge: Good  Language: Good  Akathisia:   Unable to assess due to telephone visit  Handed:  Right  AIMS (if indicated): Not done  Assets:  Communication Skills Desire for Improvement Financial Resources/Insurance Housing Leisure Time Social Support  ADL's:  Intact  Cognition: WNL  Sleep:  Fair   Screenings: AIMS    Flowsheet Row [facility]) [facility]  AIMS Total Score 0 0      AUDIT    Flowsheet Row Ad[facility] (Discharged) [facility]  Alcohol Use Disorder Identification Test Final Score (AUDIT) 5 4      GAD-7    Flowsheet Row Video Visit from 05/06/2022 in [facility] Video Visit from 01/31/2022 in [facility] Video Visit from 10/02/2021 in [facility] Video Visit from 07/10/2021 in [facility] Video Visit from 04/09/2021 in [facility]  Total GAD-7 Score  14 20 21 21 21       PHQ2-9    Flowsheet Row Video Visit from 05/06/2022 in [facility] Video Visit from 01/31/2022 in [facility] Video Visit from 10/02/2021 in [facility] Office Visit from 09/11/2021 in Primary Care at [facility] Office Visit from 07/13/2021 in [facility] Care at [facility] Total Score 4 5 6  0 0  PHQ-9 Total Score 17 19 23  -- --      Flowsheet Row Video Visit from 04/09/2021 in [facility] ED to Hosp-Admission (Discharged) from 03/04/2021 in [facility] ED to Hosp-Admission (Discharged) from 02/26/2021 in [facility] FLO[facility] PROGRESSIVE CARE AND UROLOGY  C-SSRS RISK CATEGORY Error: Q7 should not be populated when Q6 is No No Risk No Risk        Assessment and Plan: Patient reports that her anxiety and depression has improved since her last visit.  She notes that she has not hallucinated in a week but is feeling paranoid and believes that her voices will come back as they have been causing her pain.  Today patient agreeable to restarting Zyprexa 10 mg to help manage symptoms of paranoia.  Cogentin 1 mg twice daily also restarted.  She will continue doxepin as prescribed.    1. GAD (generalized anxiety disorder)  Continue- doxepin (SINEQUAN) 50 MG capsule; Take 1 capsule (50 mg total) by mouth at bedtime.  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  Dispense: 30 capsule; Refill: 3  3. Schizoaffective disorder, bipolar type (HCC)  Restart- benztropine (COGENTIN) 1 MG tablet; Take 1 mg twice daily  Dispense: 60 tablet; Refill: 3 Restart- OLANZapine (ZYPREXA) 10 MG tablet; Take 1 tablet (10 mg total) by mouth at bedtime.  Dispense: 30 tablet; Refill: 3  Follow-up in 3 months Follow-up with   Follow up in 3  months Follow up with therapy   [place], NP 05/06/2022, 10:25 AM

## 2022-05-17 ENCOUNTER — Other Ambulatory Visit: Payer: Self-pay | Admitting: Family

## 2022-05-17 DIAGNOSIS — I10 Essential (primary) hypertension: Secondary | ICD-10-CM

## 2022-05-17 DIAGNOSIS — J449 Chronic obstructive pulmonary disease, unspecified: Secondary | ICD-10-CM

## 2022-06-13 ENCOUNTER — Other Ambulatory Visit: Payer: Self-pay | Admitting: Family

## 2022-06-13 DIAGNOSIS — J449 Chronic obstructive pulmonary disease, unspecified: Secondary | ICD-10-CM

## 2022-06-13 NOTE — Telephone Encounter (Signed)
Requested Prescriptions  Pending Prescriptions Disp Refills  . albuterol (VENTOLIN HFA) 108 (90 Base) MCG/ACT inhaler [Pharmacy Med Name: ALBUTEROL HFA INH (200 PUFFS) 8.5GM] 8.5 g 1    Sig: INHALE 1 PUFF INTO THE LUNGS EVERY 4 HOURS AS NEEDED FOR WHEEZING OR SHORTNESS OF BREATH     Pulmonology:  [facility] 2 Passed - 06/13/2022  4:43 PM      Passed - Last BP in normal range    BP Readings from Last 1 Encounters:  09/11/21 120/76         Passed - Last Heart Rate in normal range    Pulse Readings from Last 1 Encounters:  09/11/21 70         Passed - Valid encounter within last 12 months    Recent Outpatient Visits          9 months ago Essential hypertension   [facility] at [facility], Amy J, NP   9 months ago Essential hypertension   [place], RPH-CPP   10 months ago Essential hypertension   Primary Care at [facility], Kriste Basque, NP   11 months ago Essential hypertension   Primary Care at [facility], [place], NP   1 year ago Essential hypertension   Primary Care at [facility], Flonnie Hailstone, NP

## 2022-06-18 ENCOUNTER — Other Ambulatory Visit (HOSPITAL_COMMUNITY): Payer: Self-pay | Admitting: Psychiatry

## 2022-06-18 DIAGNOSIS — F411 Generalized anxiety disorder: Secondary | ICD-10-CM

## 2022-06-24 ENCOUNTER — Telehealth (HOSPITAL_COMMUNITY): Payer: Self-pay | Admitting: *Deleted

## 2022-06-24 NOTE — Telephone Encounter (Signed)
Dr Ronne Binning patient called Requesting a call back stated she's now unsure what med's she's on? Patient stated she thought she had  3 refills on her med's then Rx informed she only has 2 .  && now she would like a call

## 2022-06-25 ENCOUNTER — Other Ambulatory Visit (HOSPITAL_COMMUNITY): Payer: Self-pay | Admitting: Psychiatry

## 2022-06-25 DIAGNOSIS — F411 Generalized anxiety disorder: Secondary | ICD-10-CM

## 2022-06-25 DIAGNOSIS — F5105 Insomnia due to other mental disorder: Secondary | ICD-10-CM

## 2022-06-25 DIAGNOSIS — F250 Schizoaffective disorder, bipolar type: Secondary | ICD-10-CM

## 2022-06-25 MED ORDER — DOXEPIN HCL 50 MG PO CAPS: 50.0000 mg | ORAL_CAPSULE | Freq: Every day | ORAL | 3 refills | Status: DC

## 2022-06-25 MED ORDER — GABAPENTIN 300 MG PO CAPS: 300.0000 mg | ORAL_CAPSULE | Freq: Three times a day (TID) | ORAL | 3 refills | Status: DC

## 2022-06-25 MED ORDER — BENZTROPINE MESYLATE 1 MG PO TABS: ORAL_TABLET | ORAL | 3 refills | Status: DC

## 2022-06-25 MED ORDER — OLANZAPINE 10 MG PO TABS: 10.0000 mg | ORAL_TABLET | Freq: Every day | ORAL | 3 refills | Status: DC

## 2022-06-25 NOTE — Telephone Encounter (Signed)
Patient notes that she continues to feel paranoid. She reports that she recently restarted gabapentin because it helps manage her paranoia and anxiety. All medications refilled and sent to preferred pharmacy.

## 2022-07-29 ENCOUNTER — Encounter (HOSPITAL_COMMUNITY): Payer: Self-pay | Admitting: Psychiatry

## 2022-07-29 ENCOUNTER — Telehealth (INDEPENDENT_AMBULATORY_CARE_PROVIDER_SITE_OTHER): Payer: Medicaid Other | Admitting: Psychiatry

## 2022-07-29 DIAGNOSIS — F5105 Insomnia due to other mental disorder: Secondary | ICD-10-CM | POA: Diagnosis not present

## 2022-07-29 DIAGNOSIS — F250 Schizoaffective disorder, bipolar type: Secondary | ICD-10-CM | POA: Diagnosis not present

## 2022-07-29 DIAGNOSIS — F17210 Nicotine dependence, cigarettes, uncomplicated: Secondary | ICD-10-CM

## 2022-07-29 DIAGNOSIS — F17200 Nicotine dependence, unspecified, uncomplicated: Secondary | ICD-10-CM

## 2022-07-29 DIAGNOSIS — F411 Generalized anxiety disorder: Secondary | ICD-10-CM

## 2022-07-29 DIAGNOSIS — F99 Mental disorder, not otherwise specified: Secondary | ICD-10-CM

## 2022-07-29 MED ORDER — GABAPENTIN 400 MG PO CAPS: 400.0000 mg | ORAL_CAPSULE | Freq: Three times a day (TID) | ORAL | 3 refills | Status: DC

## 2022-07-29 MED ORDER — OLANZAPINE 15 MG PO TABS: 15.0000 mg | ORAL_TABLET | Freq: Every day | ORAL | 3 refills | Status: DC

## 2022-07-29 MED ORDER — NICOTINE 21 MG/24HR TD PT24: 21.0000 mg | MEDICATED_PATCH | Freq: Every day | TRANSDERMAL | 0 refills | Status: DC

## 2022-07-29 MED ORDER — BENZTROPINE MESYLATE 1 MG PO TABS: ORAL_TABLET | ORAL | 3 refills | Status: DC

## 2022-07-29 MED ORDER — DOXEPIN HCL 75 MG PO CAPS: 75.0000 mg | ORAL_CAPSULE | Freq: Every day | ORAL | 3 refills | Status: DC

## 2022-07-29 MED ORDER — HYDROXYZINE HCL 50 MG PO TABS: ORAL_TABLET | ORAL | 3 refills | Status: DC
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
## 2022-08-25 ENCOUNTER — Other Ambulatory Visit: Payer: Self-pay | Admitting: Family

## 2022-08-25 DIAGNOSIS — I10 Essential (primary) hypertension: Secondary | ICD-10-CM

## 2022-09-22 ENCOUNTER — Other Ambulatory Visit: Payer: Self-pay | Admitting: Family

## 2022-09-22 DIAGNOSIS — I10 Essential (primary) hypertension: Secondary | ICD-10-CM

## 2022-09-23 NOTE — Telephone Encounter (Signed)
Requested medication (s) are due for refill today:Yes  Requested medication (s) are on the active medication list: Yes  Last refill:  08/27/22  Future visit scheduled: No  Notes to clinic:  Unable to refill per protocol due to failed labs, no updated results.      Requested Prescriptions  Pending Prescriptions Disp Refills   losartan (COZAAR) 25 MG tablet [Pharmacy Med Name: LOSARTAN 25MG  TABLETS] 30 tablet 0    Sig: TAKE 1 TABLET(25 MG) BY MOUTH DAILY     Cardiovascular:  Angiotensin Receptor Blockers Failed - 09/22/2022 12:26 PM      Failed - Cr in normal range and within 180 days    Creatinine, Ser  Date Value Ref Range Status  03/27/2021 0.85 0.57 - 1.00 mg/dL Final   Creatinine, Urine  Date Value Ref Range Status  03/05/2021 101.49 mg/dL Final    Comment:    Performed at [facility], [place] [address] [place], [place] [address]         Failed - K in normal range and within 180 days    Potassium  Date Value Ref Range Status  03/27/2021 4.9 3.5 - 5.2 mmol/L Final         Failed - Valid encounter within last 6 months    Recent Outpatient Visits           1 year ago Essential hypertension   [facility], [place], NP   1 year ago Essential hypertension   [place], Jarome Matin, RPH-CPP   1 year ago Essential hypertension   [facility], Kriste Basque, NP   1 year ago Essential hypertension   [facility], [place], NP   1 year ago Essential hypertension   [facility], [place], NP              Passed - Patient is not pregnant      Passed - Last BP in normal range    BP Readings from Last 1 Encounters:  09/11/21 120/76

## 2022-10-14 ENCOUNTER — Encounter (HOSPITAL_COMMUNITY): Payer: Self-pay | Admitting: Psychiatry

## 2022-10-14 ENCOUNTER — Telehealth (INDEPENDENT_AMBULATORY_CARE_PROVIDER_SITE_OTHER): Payer: Medicaid Other | Admitting: Psychiatry

## 2022-10-14 DIAGNOSIS — F411 Generalized anxiety disorder: Secondary | ICD-10-CM

## 2022-10-14 DIAGNOSIS — F250 Schizoaffective disorder, bipolar type: Secondary | ICD-10-CM | POA: Diagnosis not present

## 2022-10-14 DIAGNOSIS — F99 Mental disorder, not otherwise specified: Secondary | ICD-10-CM

## 2022-10-14 DIAGNOSIS — F17200 Nicotine dependence, unspecified, uncomplicated: Secondary | ICD-10-CM

## 2022-10-14 DIAGNOSIS — F17210 Nicotine dependence, cigarettes, uncomplicated: Secondary | ICD-10-CM | POA: Diagnosis not present

## 2022-10-14 DIAGNOSIS — F5105 Insomnia due to other mental disorder: Secondary | ICD-10-CM | POA: Diagnosis not present

## 2022-10-14 MED ORDER — OLANZAPINE 15 MG PO TABS: 15.0000 mg | ORAL_TABLET | Freq: Every day | ORAL | 3 refills | Status: DC

## 2022-10-14 MED ORDER — GABAPENTIN 400 MG PO CAPS: 400.0000 mg | ORAL_CAPSULE | Freq: Three times a day (TID) | ORAL | 3 refills | Status: DC

## 2022-10-14 MED ORDER — BENZTROPINE MESYLATE 1 MG PO TABS: ORAL_TABLET | ORAL | 3 refills | Status: DC

## 2022-10-14 MED ORDER — DOXEPIN HCL 75 MG PO CAPS: 75.0000 mg | ORAL_CAPSULE | Freq: Every day | ORAL | 3 refills | Status: DC

## 2022-10-14 MED ORDER — HYDROXYZINE HCL 50 MG PO TABS: ORAL_TABLET | ORAL | 3 refills | Status: DC

## 2022-10-14 MED ORDER — NICOTINE 21 MG/24HR TD PT24: 21.0000 mg | MEDICATED_PATCH | Freq: Every day | TRANSDERMAL | 0 refills | Status: DC

## 2022-10-14 NOTE — Progress Notes (Signed)
BH MD/PA/NP OP Progress Note  Virtual Visit via Video Note  I connected with Michaela Wells on 10/14/22 at 11:30 AM EST by a video enabled telemedicine application and verified that I am speaking with the correct person using two identifiers.  Location: Patient: Home Provider: Clinic   I discussed the limitations of evaluation and management by telemedicine and the availability of in person appointments. The patient expressed understanding and agreed to proceed.  I provided 30 minutes of non-face-to-face time during this encounter.             10/14/2022 11:09 AM Michaela Wells  MRN:  [id number]  Chief Complaint: "I am doing much better"   HPI: 62 year old female seen today for follow-up psychiatric evaluation.  She has a psychiatric history of schizoaffective disorder, bipolar disorder, marijuana use, anxiety, and depression. She is currently she is on Doxepin  mg nightly, hydroxyzine 17m three times daily, gabapentin 400 mg three times daily, cogentin 1 mg twice daily, NicoDerm CQ 21 mg patches, and Zyprexa 15 mg nightly. Today patient informed writer that her medications are effective in managing her psychiatric conditions.    Today she was well-groomed, pleasant, cooperative, but engaged in conversation.  She informed wProbation officerthat she is doing better.  She reports that she feels less depressed since increasing Zyprexa and doxepin.  Patient notes that she now believes that her neighbors are not real and attempts to avoid them when she hears them talking to her.  Patient notes that she believes that her neighbors continue to cause her pain but notes that this has reduced since her last visit.  Patient quantifies her pain as 3 out of 10 today on her feet.  At her last visit she quantified as 10 out of 10.  Patient notes that her sleep has improved since her last visit.  She also informed writer that her appetite is increased but denies weight gain.  Today she denies SI/HI or mania.   Patient continues to be somewhat paranoid about her neighbors but this too has improved since her last visit.    Since her last visit patient anxiety depression has improved.  Today provider conducted a GAD-7 to be scored a 16, last visit she scored 21.  Provider also conducted PHQ-9 the patient scored a 17, at her last visit she scored a 24.  Patient reports at times she is restless.  She notes that she does this to cope with her anxiety.  She reports that she is able to start moving if needed.  She denies abnormal muscle movements.  Provider ordered CBC, thyroid panel, liver function test, and hemoglobin A1c labs patient's last visit however she did not get her labs drawn. Provider recommended patient having these labs drawn.  She endorsed understanding and agreed at this time patient notes that she is doing well and request that her medications not be adjusted.  No other concerns noted at this time.   Visit Diagnosis:    ICD-10-CM   1. Schizoaffective disorder, bipolar type ([place]  F25.0 OLANZapine (ZYPREXA) 15 MG tablet    benztropine (COGENTIN) 1 MG tablet    2. GAD (generalized anxiety disorder)  F41.1 gabapentin (NEURONTIN) 400 MG capsule    hydrOXYzine (ATARAX) 50 MG tablet    doxepin (SINEQUAN) 75 MG capsule    3. Tobacco use disorder  F17.200 nicotine (NICODERM CQ - DOSED IN MG/24 HOURS) 21 mg/24hr patch    4. Insomnia due to other mental disorder  F51.05 doxepin (SINEQUAN)  20 MG capsule   F99        Past Psychiatric History:  schizoaffective disorder, bipolar disorder, anxiety, and depression  Past Medical History:  Past Medical History:  Diagnosis Date   Bronchial asthma    Chicken pox    COPD (chronic obstructive pulmonary disease) (HCC)    Depression    Frequent headaches    GERD (gastroesophageal reflux disease)    UTI (lower urinary tract infection)     Past Surgical History:  Procedure Laterality Date   ESOPHAGEAL DILATION     TONSILLECTOMY      Family  Psychiatric History:  Daughter Bipolar and SA, Daughter anxiety, Son anxiety  Family History:  Family History  Problem Relation Age of Onset   Other Mother        MVA-Deceased [pt was age 27]   COPD Father        Deceased   Heart attack Maternal Grandmother    Stroke Maternal Grandmother    Brain cancer Maternal Grandmother    Arthritis/Rheumatoid Maternal Grandmother    Heart attack Maternal Grandfather    Stroke Maternal Grandfather    Heart disease Maternal Aunt    Stroke Maternal Aunt        #1   Heart attack Maternal Aunt        #1   Hypertension Maternal Aunt        #1   Arthritis/Rheumatoid Sister    COPD Sister    Heart disease Maternal Uncle    Heart attack Maternal Uncle        x4   Heart disease Brother        #1   Heart attack Brother        #1   Diabetes Brother    Diabetes Maternal Aunt    Mental retardation Maternal Aunt    Healthy Daughter        x2   Drug abuse Daughter        #1   Drug abuse Daughter        #2   Kidney Stones Daughter        #2   Other Son        Agoraphobia    Social History:  Social History   Socioeconomic History   Marital status: Single    Spouse name: Not on file   Number of children: Not on file   Years of education: Not on file   Highest education level: Not on file  Occupational History   Not on file  Tobacco Use   Smoking status: Every Day   Smokeless tobacco: Never  Substance and Sexual Activity   Alcohol use: Yes   Drug use: Not Currently   Sexual activity: Not Currently  Other Topics Concern   Not on file  Social History Narrative   Right handed    Caffeine- 12 pack of mountain dew    Lives at home with 2 daughters and 3 grandkids   Social Determinants of Health   Financial Resource Strain: Not on file  Food Insecurity: Not on file  Transportation Needs: Not on file  Physical Activity: Not on file  Stress: Not on file  Social Connections: Not on file    Allergies:  Allergies  Allergen  Reactions   Penicillins Other (See Comments)    Childhood allergy- Reaction?? Has patient had a PCN reaction causing immediate rash, facial/tongue/throat swelling, SOB or lightheadedness with hypotension: Unknown Has patient had a PCN reaction causing severe  rash involving mucus membranes or skin necrosis: Unknown Has patient had a PCN reaction that required hospitalization: Unknown Has patient had a PCN reaction occurring within the last 10 years: Unknown If all of the above answers are "NO", then may proceed with Cephalosporin use.      Metabolic Disorder Labs: Lab Results  Component Value Date   HGBA1C 5.3 07/13/2021   MPG 145.59 05/06/2018   No results found for: "PROLACTIN" Lab Results  Component Value Date   CHOL 203 (H) 05/06/2018   TRIG 73 05/06/2018   HDL 51 05/06/2018   CHOLHDL 4.0 05/06/2018   VLDL 15 05/06/2018   LDLCALC 137 (H) 05/06/2018   Lab Results  Component Value Date   TSH 3.501 02/27/2021   TSH 3.388 10/23/2019    Therapeutic Level Labs: No results found for: "LITHIUM" Lab Results  Component Value Date   VALPROATE 56 03/08/2021   VALPROATE 132 (H) 03/06/2021   No results found for: "CBMZ"  Current Medications: Current Outpatient Medications  Medication Sig Dispense Refill   albuterol (VENTOLIN HFA) 108 (90 Base) MCG/ACT inhaler INHALE 1 PUFF INTO THE LUNGS EVERY 4 HOURS AS NEEDED FOR WHEEZING OR SHORTNESS OF BREATH 8.5 g 1   benztropine (COGENTIN) 1 MG tablet Take 1 mg twice daily 60 tablet 3   doxepin (SINEQUAN) 75 MG capsule Take 1 capsule (75 mg total) by mouth at bedtime. 30 capsule 3   fluticasone (FLONASE) 50 MCG/ACT nasal spray Place 2 sprays into both nostrils daily.     gabapentin (NEURONTIN) 400 MG capsule Take 1 capsule (400 mg total) by mouth 3 (three) times daily. 90 capsule 3   hydrOXYzine (ATARAX) 50 MG tablet TAKE 1 TABLET(50 MG) BY MOUTH THREE TIMES DAILY AS NEEDED 90 tablet 3   losartan (COZAAR) 25 MG tablet TAKE 1 TABLET(25  MG) BY MOUTH DAILY 30 tablet 0   metoprolol succinate (TOPROL-XL) 25 MG 24 hr tablet TAKE 1 TABLET(25 MG) BY MOUTH DAILY 90 tablet 0   nicotine (NICODERM CQ - DOSED IN MG/24 HOURS) 21 mg/24hr patch Place 1 patch (21 mg total) onto the skin daily. 28 patch 0   OLANZapine (ZYPREXA) 15 MG tablet Take 1 tablet (15 mg total) by mouth at bedtime. 30 tablet 3   No current facility-administered medications for this visit.     Musculoskeletal: Strength & Muscle Tone: within normal limits and telehealth visit Gait & Station: normal, telehealth visit Patient leans: N/A  Psychiatric Specialty Exam: Review of Systems  There were no vitals taken for this visit.There is no height or weight on file to calculate BMI.  General Appearance: Well Groomed  Eye Contact:  Good  Speech:  Clear and Coherent and Normal Rate  Volume:  Normal  Mood:  Anxious and Depressed  Affect:  Appropriate and Congruent  Thought Process:  Coherent, Goal Directed and Linear  Orientation:  Full (Time, Place, and Person)  Thought Content: Hallucinations: Auditory, Ideas of Reference:   Paranoia Delusions, and Paranoid Ideation   Suicidal Thoughts:  No  Homicidal Thoughts:  No  Memory:  Immediate;   Good Recent;   Good Remote;   Good  Judgement:  Good  Insight:  Fair  Psychomotor Activity:  Restlessness  Concentration:  Concentration: Good and Attention Span: Good  Recall:  Good  Fund of Knowledge: Good  Language: Good  Akathisia:  No  Handed:  Right  AIMS (if indicated): Not done  Assets:  Communication Skills Desire for Improvement Financial Resources/Insurance Housing Leisure Time  Social Support  ADL's:  Intact  Cognition: WNL  Sleep:  Good   Screenings: AIMS    Flowsheet Row Admission (Discharged) from OP Visit from 10/19/2019 in [facility] Admission (Discharged) from 05/05/2018 in [facility]  AIMS Total Score 0 0      AUDIT     Flowsheet Row Admission (Discharged) from OP Visit from 10/19/2019 in [facility] Admission (Discharged) from 05/05/2018 in [facility]  Alcohol Use Disorder Identification Test Final Score (AUDIT) 5 4      GAD-7    Flowsheet Row Video Visit from 10/14/2022 in [facility] Video Visit from 07/29/2022 in [facility] Video Visit from 05/06/2022 in [facility] Video Visit from 01/31/2022 in [facility] Video Visit from 10/02/2021 in [facility]  Total GAD-7 Score 16 21 14 20 21      $ PHQ2-9    Flowsheet Row Video Visit from 10/14/2022 in [facility] Video Visit from 07/29/2022 in [facility] Video Visit from 05/06/2022 in [facility] Video Visit from 01/31/2022 in [facility] Video Visit from 10/02/2021 in [place]  PHQ-2 Total Score 3 6 4 5 6  $ PHQ-9 Total Score 17 24 17 19 23      $ Flowsheet Row Video Visit from 04/09/2021 in [facility] ED to Hosp-Admission (Discharged) from 03/04/2021 in [place] ED to Hosp-Admission (Discharged) from 02/26/2021 in [place]  C-SSRS RISK CATEGORY Error: Q7 should not be populated when Q6 is No No Risk No Risk        Assessment and Plan: Patient notes that her anxiety, depression, psychosis, and sleep has improved since her last visit.Provider ordered CBC, thyroid panel, liver function test, and hemoglobin A1c labs patient's last visit however she did not get her labs drawn. Provider recommended patient having these labs drawn.  She endorsed understanding and agreed at this time patient notes that she is doing well and request that  her medications not be adjusted.  1. Schizoaffective disorder, bipolar type ([place])  Continue- benztropine (COGENTIN) 1 MG tablet; Take 1 mg twice daily  Dispense: 60 tablet; Refill: 3 Continue- OLANZapine (ZYPREXA) 15 MG tablet; Take 1 tablet (15 mg total) by mouth at bedtime.  Dispense: 30 tablet; Refill: 3 - Hepatic function panel - HgB A1c - CBC w/Diff/Platelet - Thyroid Panel With TSH  2. GAD (generalized anxiety disorder)  Continue- doxepin (SINEQUAN) 75 MG capsule; Take 1 capsule (75 mg total) by mouth at bedtime.  Dispense: 30 capsule; Refill: 3 Continue- gabapentin (NEURONTIN) 400 MG capsule; Take 1 capsule (400 mg total) by mouth 3 (three) times daily.  Dispense: 90 capsule; Refill: 3 Continue- hydrOXYzine (ATARAX) 50 MG tablet; TAKE 1 TABLET(50 MG) BY MOUTH THREE TIMES DAILY AS NEEDED  Dispense: 90 tablet; Refill: 3  3. Insomnia due to other mental disorder  Continue- doxepin (SINEQUAN) 75 MG capsule; Take 1 capsule (75 mg total) by mouth at bedtime.  Dispense: 30 capsule; Refill: 3  4. Tobacco use disorder  Continue- nicotine (NICODERM CQ - DOSED IN MG/24 HOURS) 21 mg/24hr patch; Place 1 patch (21 mg total) onto the skin daily.  Dispense: 28 patch; Refill: 0     Follow-up in  3 months Follow-up with   Follow up in 3 months Follow up with therapy   Salley Slaughter, NP 10/14/2022, 11:09 AM

## 2022-11-20 ENCOUNTER — Other Ambulatory Visit: Payer: Self-pay | Admitting: Family

## 2022-11-20 DIAGNOSIS — L210 Seborrhea capitis: Secondary | ICD-10-CM

## 2023-01-01 ENCOUNTER — Telehealth (HOSPITAL_COMMUNITY): Payer: Medicaid Other | Admitting: Psychiatry

## 2023-01-01 ENCOUNTER — Encounter (HOSPITAL_COMMUNITY): Payer: Self-pay

## 2023-01-02 ENCOUNTER — Telehealth (INDEPENDENT_AMBULATORY_CARE_PROVIDER_SITE_OTHER): Payer: Medicaid Other | Admitting: Psychiatry

## 2023-01-02 ENCOUNTER — Encounter (HOSPITAL_COMMUNITY): Payer: Self-pay | Admitting: Psychiatry

## 2023-01-02 DIAGNOSIS — F17200 Nicotine dependence, unspecified, uncomplicated: Secondary | ICD-10-CM

## 2023-01-02 DIAGNOSIS — Z720 Tobacco use: Secondary | ICD-10-CM

## 2023-01-02 DIAGNOSIS — F411 Generalized anxiety disorder: Secondary | ICD-10-CM | POA: Diagnosis not present

## 2023-01-02 DIAGNOSIS — F250 Schizoaffective disorder, bipolar type: Secondary | ICD-10-CM

## 2023-01-02 DIAGNOSIS — F5105 Insomnia due to other mental disorder: Secondary | ICD-10-CM

## 2023-01-02 MED ORDER — GABAPENTIN 400 MG PO CAPS: 400.0000 mg | ORAL_CAPSULE | Freq: Three times a day (TID) | ORAL | 3 refills | Status: DC

## 2023-01-02 MED ORDER — OLANZAPINE 20 MG PO TABS: 20.0000 mg | ORAL_TABLET | Freq: Every day | ORAL | 3 refills | Status: DC

## 2023-01-02 MED ORDER — HYDROXYZINE HCL 50 MG PO TABS: ORAL_TABLET | ORAL | 3 refills | Status: DC

## 2023-01-02 MED ORDER — NICOTINE 21 MG/24HR TD PT24: 21.0000 mg | MEDICATED_PATCH | Freq: Every day | TRANSDERMAL | 0 refills | Status: DC

## 2023-01-02 MED ORDER — DOXEPIN HCL 100 MG PO CAPS: 100.0000 mg | ORAL_CAPSULE | Freq: Every day | ORAL | 3 refills | Status: DC

## 2023-01-02 MED ORDER — BENZTROPINE MESYLATE 1 MG PO TABS: ORAL_TABLET | ORAL | 3 refills | Status: DC

## 2023-01-02 NOTE — Progress Notes (Signed)
BH MD/PA/NP OP Progress Note  Virtual Visit via Video Note  I connected with Michaela Wells on 01/02/23 at  1:00 PM EDT by a video enabled telemedicine application and verified that I am speaking with the correct person using two identifiers.  Location: Patient: Home Provider: Clinic   I discussed the limitations of evaluation and management by telemedicine and the availability of in person appointments. The patient expressed understanding and agreed to proceed.  I provided 30 minutes of non-face-to-face time during this encounter.             01/02/2023 4:20 PM Michaela Wells  MRN:  [id number]  Chief Complaint: "Since its getting warmer the voices are more prominent"   HPI: 62 year old female seen today for follow-up psychiatric evaluation.  She has a psychiatric history of schizoaffective disorder, bipolar disorder, marijuana use, anxiety, and depression. She is currently she is on Doxepin 75 mg nightly, hydroxyzine 50mg  three times daily, gabapentin 400 mg three times daily, cogentin 1 mg twice daily, NicoDerm CQ 21 mg patches, and Zyprexa 15 mg nightly. Today patient informed writer that her medications are somewhat effective in managing her psychiatric conditions.    Today she was well-groomed, pleasant, cooperative, but engaged in conversation.  She informed Clinical research associate that since its getting warmer the voices are more prominent. She also notes that they are hurting her. She reports that she is isolating and getting more depressed. She reports that she is also more anxious and notes that her neighbors (voices) mess with her heart and back. She reports that she also make her feet and muscles cramp.  To cope patient notes that she smokes marijuana daily. Provider informed patient hat marijuana can worsen her psychosis. She endorsed understanding but nots that it help her calm down.Today provider conducted GAD-7 and patient scored.  21, at her last visit she scored a 16.  Provider also  conducted PHQ-9 and patient scored a 22, at her last visit she scored a 17.  She endorses adequate sleep and appetite.  Today she denies SI/HI/  Provider ordered CBC, thyroid panel, liver function test, CMP, Lipid panel, and hemoglobin A1c labs patient's last visit however she did not get her labs drawn. Provider recommended patient coming into her next appointment to be assessed and informed her that labs could be taken at that time.  She endorsed understanding and agreed.  Today Zyprexa increased from 15 mg to 20 mg to help manage symptoms of psychosis.  Doxepin increased from 75 mg to 100 mg to help manage anxiety and depression.  She will continue on the medications as prescribed.  No other concerns at this time.  Visit Diagnosis:    ICD-10-CM   1. Schizoaffective disorder, bipolar type (HCC)  F25.0 OLANZapine (ZYPREXA) 20 MG tablet    benztropine (COGENTIN) 1 MG tablet    CBC w/Diff/Platelet    Hepatic function panel    Thyroid Panel With TSH    HgB A1c    Prolactin    Lipid Profile    Comprehensive Metabolic Panel (CMET)    2. GAD (generalized anxiety disorder)  F41.1 hydrOXYzine (ATARAX) 50 MG tablet    gabapentin (NEURONTIN) 400 MG capsule    doxepin (SINEQUAN) 100 MG capsule    3. Insomnia due to other mental disorder  F51.05 doxepin (SINEQUAN) 100 MG capsule   F99     4. Tobacco use disorder  F17.200 nicotine (NICODERM CQ - DOSED IN MG/24 HOURS) 21 mg/24hr patch  Past Psychiatric History:  schizoaffective disorder, bipolar disorder, anxiety, and depression  Past Medical History:  Past Medical History:  Diagnosis Date   Bronchial asthma    Chicken pox    COPD (chronic obstructive pulmonary disease) (HCC)    Depression    Frequent headaches    GERD (gastroesophageal reflux disease)    UTI (lower urinary tract infection)     Past Surgical History:  Procedure Laterality Date   ESOPHAGEAL DILATION     TONSILLECTOMY      Family Psychiatric History:  Daughter  Bipolar and SA, Daughter anxiety, Son anxiety  Family History:  Family History  Problem Relation Age of Onset   Other Mother        MVA-Deceased [pt was age 21]   COPD Father        Deceased   Heart attack Maternal Grandmother    Stroke Maternal Grandmother    Brain cancer Maternal Grandmother    Arthritis/Rheumatoid Maternal Grandmother    Heart attack Maternal Grandfather    Stroke Maternal Grandfather    Heart disease Maternal Aunt    Stroke Maternal Aunt        #1   Heart attack Maternal Aunt        #1   Hypertension Maternal Aunt        #1   Arthritis/Rheumatoid Sister    COPD Sister    Heart disease Maternal Uncle    Heart attack Maternal Uncle        x4   Heart disease Brother        #1   Heart attack Brother        #1   Diabetes Brother    Diabetes Maternal Aunt    Mental retardation Maternal Aunt    Healthy Daughter        x2   Drug abuse Daughter        #1   Drug abuse Daughter        #2   Kidney Stones Daughter        #2   Other Son        Agoraphobia    Social History:  Social History   Socioeconomic History   Marital status: Single    Spouse name: Not on file   Number of children: Not on file   Years of education: Not on file   Highest education level: Not on file  Occupational History   Not on file  Tobacco Use   Smoking status: Every Day   Smokeless tobacco: Never  Substance and Sexual Activity   Alcohol use: Yes   Drug use: Not Currently   Sexual activity: Not Currently  Other Topics Concern   Not on file  Social History Narrative   Right handed    Caffeine- 12 pack of mountain dew    Lives at home with 2 daughters and 3 grandkids   Social Determinants of Health   Financial Resource Strain: Not on file  Food Insecurity: Not on file  Transportation Needs: Not on file  Physical Activity: Not on file  Stress: Not on file  Social Connections: Not on file    Allergies:  Allergies  Allergen Reactions   Penicillins Other (See  Comments)    Childhood allergy- Reaction?? Has patient had a PCN reaction causing immediate rash, facial/tongue/throat swelling, SOB or lightheadedness with hypotension: Unknown Has patient had a PCN reaction causing severe rash involving mucus membranes or skin necrosis: Unknown Has patient had a PCN  reaction that required hospitalization: Unknown Has patient had a PCN reaction occurring within the last 10 years: Unknown If all of the above answers are "NO", then may proceed with Cephalosporin use.      Metabolic Disorder Labs: Lab Results  Component Value Date   HGBA1C 5.3 07/13/2021   MPG 145.59 05/06/2018   No results found for: "PROLACTIN" Lab Results  Component Value Date   CHOL 203 (H) 05/06/2018   TRIG 73 05/06/2018   HDL 51 05/06/2018   CHOLHDL 4.0 05/06/2018   VLDL 15 05/06/2018   LDLCALC 137 (H) 05/06/2018   Lab Results  Component Value Date   TSH 3.501 02/27/2021   TSH 3.388 10/23/2019    Therapeutic Level Labs: No results found for: "LITHIUM" Lab Results  Component Value Date   VALPROATE 56 03/08/2021   VALPROATE 132 (H) 03/06/2021   No results found for: "CBMZ"  Current Medications: Current Outpatient Medications  Medication Sig Dispense Refill   albuterol (VENTOLIN HFA) 108 (90 Base) MCG/ACT inhaler INHALE 1 PUFF INTO THE LUNGS EVERY 4 HOURS AS NEEDED FOR WHEEZING OR SHORTNESS OF BREATH 8.5 g 1   benztropine (COGENTIN) 1 MG tablet Take 1 mg twice daily 60 tablet 3   doxepin (SINEQUAN) 100 MG capsule Take 1 capsule (100 mg total) by mouth at bedtime. 30 capsule 3   fluticasone (FLONASE) 50 MCG/ACT nasal spray Place 2 sprays into both nostrils daily.     gabapentin (NEURONTIN) 400 MG capsule Take 1 capsule (400 mg total) by mouth 3 (three) times daily. 90 capsule 3   hydrOXYzine (ATARAX) 50 MG tablet TAKE 1 TABLET(50 MG) BY MOUTH THREE TIMES DAILY AS NEEDED 90 tablet 3   losartan (COZAAR) 25 MG tablet TAKE 1 TABLET(25 MG) BY MOUTH DAILY 30 tablet 0    metoprolol succinate (TOPROL-XL) 25 MG 24 hr tablet TAKE 1 TABLET(25 MG) BY MOUTH DAILY 90 tablet 0   nicotine (NICODERM CQ - DOSED IN MG/24 HOURS) 21 mg/24hr patch Place 1 patch (21 mg total) onto the skin daily. 28 patch 0   OLANZapine (ZYPREXA) 20 MG tablet Take 1 tablet (20 mg total) by mouth at bedtime. 30 tablet 3   No current facility-administered medications for this visit.     Musculoskeletal: Strength & Muscle Tone: within normal limits and telehealth visit Gait & Station: normal, telehealth visit Patient leans: N/A  Psychiatric Specialty Exam: Review of Systems  There were no vitals taken for this visit.There is no height or weight on file to calculate BMI.  General Appearance: Well Groomed  Eye Contact:  Good  Speech:  Clear and Coherent and Normal Rate  Volume:  Normal  Mood:  Anxious and Depressed  Affect:  Appropriate and Congruent  Thought Process:  Coherent, Goal Directed and Linear  Orientation:  Full (Time, Place, and Person)  Thought Content: Hallucinations: Auditory, Ideas of Reference:   Paranoia Delusions, and Paranoid Ideation   Suicidal Thoughts:  No  Homicidal Thoughts:  No  Memory:  Immediate;   Good Recent;   Good Remote;   Good  Judgement:  Good  Insight:  Fair  Psychomotor Activity:  Restlessness  Concentration:  Concentration: Good and Attention Span: Good  Recall:  Good  Fund of Knowledge: Good  Language: Good  Akathisia:  No  Handed:  Right  AIMS (if indicated): Not done  Assets:  Communication Skills Desire for Improvement Financial Resources/Insurance Housing Leisure Time Social Support  ADL's:  Intact  Cognition: WNL  Sleep:  Good  Screenings: AIMS    Flowsheet Row [facility] (Discharged) [facility]  AIMS Total Score 0 0      AUDIT    Flowsheet Row Admission  (Discharged) [facility]  Alcohol Use Disorder Identification Test Final Score (AUDIT) 5 4      GAD-7    Flowsheet Row Video Visit from 01/02/2023 in [facility] Video Visit from 10/14/2022 in [facility] Video Visit from 07/29/2022 in [facility] Video Visit from 05/06/2022 in [facility] Video Visit from 01/31/2022 in [facility]  Total GAD-7 Score 21 16 21 14 20       PHQ2-9    Flowsheet Row Video Visit from 01/02/2023 in [facility] Video Visit from 10/14/2022 in [facility] Video Visit from 07/29/2022 in [facility] Video Visit from 05/06/2022 in [facility] Video Visit from 01/31/2022 in [facility]  PHQ-2 Total Score 5 3 6 4 5   PHQ-9 Total Score 22 17 24 17 19       Flowsheet Row Video Visit from 04/09/2021 in [facility] ED to Hosp-Admission (Discharged) from 03/04/2021 in [place] [facility] ED to Hosp-Admission (Discharged) from 02/26/2021 in [place] [facility] FLOOR PROGRESSIVE CARE AND UROLOGY  C-SSRS RISK CATEGORY Error: Q7 should not be populated when Q6 is No No Risk No Risk        Assessment and Plan: Patient notes that her anxiety, depression, psychosis, and sleep has declined since her last visit. Provider ordered CBC, thyroid panel, liver function test, CMP, Lipid panel, and hemoglobin A1c labs patient's last visit however she did not get her labs drawn. Provider recommended patient coming into her next appointment to be assessed and informed her that labs could be taken at that time.  She endorsed understanding and  agreed.  Today Zyprexa increased from 15 mg to 20 mg to help manage symptoms of psychosis.  Doxepin increased from 75 mg to 100 mg to help manage anxiety and depression.  She will continue on the medications as prescribed.  1. Schizoaffective disorder, bipolar type (HCC)  Increased- OLANZapine (ZYPREXA) 20 MG tablet; Take 1 tablet (20 mg total) by mouth at bedtime.  Dispense: 30 tablet; Refill: 3 Continue- benztropine (COGENTIN) 1 MG tablet; Take 1 mg twice daily  Dispense: 60 tablet; Refill: 3 - CBC w/Diff/Platelet - Hepatic function panel - Thyroid Panel With TSH - HgB A1c - Prolactin - Lipid Profile - Comprehensive Metabolic Panel (CMET)  2. GAD (generalized anxiety disorder)  Continue- hydrOXYzine (ATARAX) 50 MG tablet; TAKE 1 TABLET(50 MG) BY MOUTH THREE TIMES DAILY AS NEEDED  Dispense: 90 tablet; Refill: 3 Continue- gabapentin (NEURONTIN) 400 MG capsule; Take 1 capsule (400 mg total) by mouth 3 (three) times daily.  Dispense: 90 capsule; Refill: 3 Increased- doxepin (SINEQUAN) 100 MG capsule; Take 1 capsule (100 mg total) by mouth at bedtime.  Dispense: 30 capsule; Refill: 3  3. Insomnia due to other mental disorder  Increased- doxepin (SINEQUAN) 100 MG capsule; Take 1 capsule (100 mg total) by mouth at bedtime.  Dispense: 30 capsule; Refill: 3  4. Tobacco use  disorder  Continue- nicotine (NICODERM CQ - DOSED IN MG/24 HOURS) 21 mg/24hr patch; Place 1 patch (21 mg total) onto the skin daily.  Dispense: 28 patch; Refill: 0      Follow-up in 2.5 months Follow-up with      Shanna Cisco, NP 01/02/2023, 4:20 PM

## 2023-03-10 ENCOUNTER — Encounter (HOSPITAL_COMMUNITY): Payer: Medicaid Other | Admitting: Psychiatry

## 2023-03-24 ENCOUNTER — Ambulatory Visit (INDEPENDENT_AMBULATORY_CARE_PROVIDER_SITE_OTHER): Payer: MEDICAID | Admitting: Psychiatry

## 2023-03-24 ENCOUNTER — Encounter (HOSPITAL_COMMUNITY): Payer: Self-pay | Admitting: Psychiatry

## 2023-03-24 ENCOUNTER — Other Ambulatory Visit: Payer: Self-pay

## 2023-03-24 ENCOUNTER — Encounter (HOSPITAL_COMMUNITY): Payer: Self-pay

## 2023-03-24 ENCOUNTER — Emergency Department (HOSPITAL_COMMUNITY)
Admission: EM | Admit: 2023-03-24 | Discharge: 2023-03-24 | Discharge status: Against Medical Advice | Payer: Medicaid Other | Attending: Emergency Medicine | Admitting: Emergency Medicine

## 2023-03-24 VITALS — BP 162/134 | HR 82 | Temp 98.5°F | Ht 62.0 in | Wt 178.6 lb

## 2023-03-24 DIAGNOSIS — F250 Schizoaffective disorder, bipolar type: Secondary | ICD-10-CM

## 2023-03-24 DIAGNOSIS — I10 Essential (primary) hypertension: Secondary | ICD-10-CM | POA: Diagnosis present

## 2023-03-24 DIAGNOSIS — Z5321 Procedure and treatment not carried out due to patient leaving prior to being seen by health care provider: Secondary | ICD-10-CM | POA: Insufficient documentation

## 2023-03-24 DIAGNOSIS — F17200 Nicotine dependence, unspecified, uncomplicated: Secondary | ICD-10-CM | POA: Diagnosis not present

## 2023-03-24 DIAGNOSIS — F5105 Insomnia due to other mental disorder: Secondary | ICD-10-CM | POA: Diagnosis not present

## 2023-03-24 DIAGNOSIS — G9341 Metabolic encephalopathy: Secondary | ICD-10-CM

## 2023-03-24 DIAGNOSIS — F99 Mental disorder, not otherwise specified: Secondary | ICD-10-CM

## 2023-03-24 DIAGNOSIS — F411 Generalized anxiety disorder: Secondary | ICD-10-CM | POA: Diagnosis not present

## 2023-03-24 HISTORY — DX: Essential (primary) hypertension: I10

## 2023-03-24 LAB — CBC WITH DIFFERENTIAL/PLATELET
Abs Immature Granulocytes: 0.02 10*3/uL (ref 0.00–0.07)
Basophils Absolute: 0 10*3/uL (ref 0.0–0.1)
Basophils Relative: 1 %
Eosinophils Absolute: 0 10*3/uL (ref 0.0–0.5)
Eosinophils Relative: 0 %
HCT: 42.2 % (ref 36.0–46.0)
Hemoglobin: 13.8 g/dL (ref 12.0–15.0)
Immature Granulocytes: 0 %
Lymphocytes Relative: 30 %
Lymphs Abs: 2.1 10*3/uL (ref 0.7–4.0)
MCH: 29.7 pg (ref 26.0–34.0)
MCHC: 32.7 g/dL (ref 30.0–36.0)
MCV: 90.8 fL (ref 80.0–100.0)
Monocytes Absolute: 0.5 10*3/uL (ref 0.1–1.0)
Monocytes Relative: 7 %
Neutro Abs: 4.3 10*3/uL (ref 1.7–7.7)
Neutrophils Relative %: 62 %
Platelets: 329 10*3/uL (ref 150–400)
RBC: 4.65 MIL/uL (ref 3.87–5.11)
RDW: 13 % (ref 11.5–15.5)
WBC: 6.9 10*3/uL (ref 4.0–10.5)
nRBC: 0 % (ref 0.0–0.2)

## 2023-03-24 LAB — URINALYSIS, ROUTINE W REFLEX MICROSCOPIC
Bilirubin Urine: NEGATIVE
Glucose, UA: NEGATIVE mg/dL
Hgb urine dipstick: NEGATIVE
Ketones, ur: NEGATIVE mg/dL
Leukocytes,Ua: NEGATIVE
Nitrite: NEGATIVE
Protein, ur: NEGATIVE mg/dL
Specific Gravity, Urine: 1.008 (ref 1.005–1.030)
pH: 7 (ref 5.0–8.0)

## 2023-03-24 LAB — BASIC METABOLIC PANEL
Anion gap: 12 (ref 5–15)
BUN: 10 mg/dL (ref 8–23)
CO2: 25 mmol/L (ref 22–32)
Calcium: 9.2 mg/dL (ref 8.9–10.3)
Chloride: 102 mmol/L (ref 98–111)
Creatinine, Ser: 0.98 mg/dL (ref 0.44–1.00)
GFR, Estimated: >60 mL/min (ref >60)
Glucose, Bld: 85 mg/dL (ref 70–99)
Potassium: 3.5 mmol/L (ref 3.5–5.1)
Sodium: 139 mmol/L (ref 135–145)

## 2023-03-24 LAB — TROPONIN I (HIGH SENSITIVITY): Troponin I (High Sensitivity): 4 ng/L (ref <18)

## 2023-03-24 MED ORDER — HYDROXYZINE HCL 50 MG PO TABS
ORAL_TABLET | ORAL | 3 refills | Status: DC
Start: 2023-03-24 — End: 2023-06-10

## 2023-03-24 MED ORDER — NICOTINE POLACRILEX 4 MG MT GUM
4.0000 mg | CHEWING_GUM | OROMUCOSAL | 0 refills | Status: DC | PRN
Start: 2023-03-24 — End: 2023-03-24

## 2023-03-24 MED ORDER — NICOTINE POLACRILEX 4 MG MT GUM
4.0000 mg | CHEWING_GUM | OROMUCOSAL | 3 refills | Status: DC | PRN
Start: 2023-03-24 — End: 2023-05-05

## 2023-03-24 MED ORDER — OLANZAPINE 20 MG PO TABS
20.0000 mg | ORAL_TABLET | Freq: Every day | ORAL | 3 refills | Status: DC
Start: 2023-03-24 — End: 2023-06-10

## 2023-03-24 MED ORDER — NICOTINE 21 MG/24HR TD PT24
21.0000 mg | MEDICATED_PATCH | Freq: Every day | TRANSDERMAL | 0 refills | Status: DC
Start: 2023-03-24 — End: 2023-03-24

## 2023-03-24 MED ORDER — NICOTINE 21 MG/24HR TD PT24: 21.0000 mg | MEDICATED_PATCH | Freq: Every day | TRANSDERMAL | 3 refills | Status: DC

## 2023-03-24 MED ORDER — GABAPENTIN 400 MG PO CAPS: 400.0000 mg | ORAL_CAPSULE | Freq: Three times a day (TID) | ORAL | 3 refills | Status: DC

## 2023-03-24 MED ORDER — BENZTROPINE MESYLATE 1 MG PO TABS: ORAL_TABLET | ORAL | 3 refills | Status: DC

## 2023-03-24 MED ORDER — DOXEPIN HCL 100 MG PO CAPS: 100.0000 mg | ORAL_CAPSULE | Freq: Every day | ORAL | 3 refills | Status: DC

## 2023-03-24 NOTE — ED Provider Triage Note (Signed)
Emergency Medicine Provider Triage Evaluation Note  Michaela Wells , a 62 y.o. female  was evaluated in triage.  Pt complains of hypertension.  Patient states in the past she has been on hypertensive medication however has not been over the past year as she has not seen a primary care provider.  Patient was at an outpatient physician's office today in which her blood pressure was elevated and referred to ED.  Patient is unsure what her blood pressure was.  Patient denies any chest pain, shortness of breath, change in sensation/motor skills, headaches, neck pain.  Patient does note that she has burning when she urinates but denies any fevers or abdominal pain.  Review of Systems  Positive: See HPI Negative: See HPI  Physical Exam  BP (!) 159/118 (BP Location: Right Arm)   Pulse 85   Temp 98.7 F (37.1 C) (Oral)   Resp 16   SpO2 98%  Gen:   Awake, no distress   Resp:  Normal effort  MSK:   Moves extremities without difficulty  Other:    Medical Decision Making  Medically screening exam initiated at 4:37 PM.  Appropriate orders placed.  [name] was informed that the remainder of the evaluation will be completed by another provider, this initial triage assessment does not replace that evaluation, and the importance of remaining in the ED until their evaluation is complete.  Workup initiated, patient stable at this time   Remi Deter 03/24/23 1638

## 2023-03-24 NOTE — ED Triage Notes (Signed)
Pt arrives with c/o hypertension after a doctors visit. Pt sent from her psych MD due to hypertension. Pt does have a hx of HTN, but has not taken her BP med sin a year due to her not seeing her PCP. Pt denies headaches and vision changes.

## 2023-03-24 NOTE — Progress Notes (Signed)
BH MD/PA/NP OP Progress Note     03/24/2023 4:18 PM Michaela Wells  MRN:  [id number]  Chief Complaint: "At night they hurt me but I feel like my medications are working"   HPI: 62 year old female seen today for follow-up psychiatric evaluation.  She has a psychiatric history of schizoaffective disorder, bipolar disorder, marijuana use, anxiety, and depression. She is currently she is on Doxepin 100 mg nightly, hydroxyzine 50mg  three times daily, gabapentin 400 mg three times daily, cogentin 1 mg twice daily, NicoDerm CQ 21 mg patches, and Zyprexa 20 mg nightly. Today patient informed writer that her medications are effective in managing her psychiatric conditions.    Today she was well-groomed, pleasant, cooperative, engaged in conversation, and maintained eye contact.  She informed Clinical research associate that her medications are effective but notes that she continues to have auditory and tactile hallucinations.  She notes that her hallucinations worsen at night where they began to hurt her.  Patient gave provider a 6 page document detailing how she has been feeling.  She informed Clinical research associate that she found this document online and it summarizes her mental health.  She informed Clinical research associate that she feels that she is dealing with electronic harassment and has people stalking her.  She also notes that she feels that the government is against her.  She reports that these individuals do not want the public to know that they are harming her but direct energy towards her that is detrimental.  She describes having stabbing or electronic jolts throughout her body.  She describes this as being painful and torturous.  She reports that it causes her to feel weak and incapacitated.  She also informed writer that this stocking interferes with her sleep.  She reports sleeping 2 hours on and off with 5 hours of sleep.  Today she denies SI/HI.  Patient continues to struggle with paranoia, racing thoughts, and distractibility.    Provider  asked patient if she continues to use marijuana or other illegal substances she informed writer that she uses marijuana daily.  She denies alcohol use.  She does note that she smokes a pack of cigarette daily.  Provider informed patient that marijuana can exacerbate her mental health.  She endorsed understanding however reports that it is helpful to her mental health.    Patient's blood pressure was notably elevated today at 162/134.  Provider took her blood pressure again and it was elevated at 160/120.  Provider recommended that patient go to the ED.  She was agreeable to this.  Provider spoke to patient's daughter Michaela Wells who drove her to her appointment and instructed her to take her mother to the ED.  She endorsed understanding and agreement. Patient informed Clinical research associate that she has not been taking her blood pressure medications in over a year.  Provider discussed the risk of cardiac disease or stroke.  She notes that she would also follow-up with her primary care doctor.  Today provider conducted an AIMs assessment and patient scored a 0.  In the past patient was diagnosed with metabolic encephalopathy. Provider informed patient that symptoms of psychosis could be related to this.  Patient informed that she has not been seen by neurology since having this diagnosis.  Patient referred to [facility] neurology for further evaluation.  Today patient agreeable to starting Nicorette 4 mg gum.  She will continue her other medication as prescribed.  Provider also encouraged patient to have labs drawn provider ordered CBC, thyroid panel, liver function test, CMP, Lipid panel, and  hemoglobin A1c labs patient's last visit and requested that she follow-up with [facility].  She endorsed understanding and agreed.  No other concerns noted at this time.   Visit Diagnosis:    ICD-10-CM   1. Schizoaffective disorder, bipolar type (HCC)  F25.0 benztropine (COGENTIN) 1 MG tablet    OLANZapine (ZYPREXA) 20 MG tablet    2.  GAD (generalized anxiety disorder)  F41.1 doxepin (SINEQUAN) 100 MG capsule    hydrOXYzine (ATARAX) 50 MG tablet    gabapentin (NEURONTIN) 400 MG capsule    3. Insomnia due to other mental disorder  F51.05 doxepin (SINEQUAN) 100 MG capsule   F99     4. Tobacco use disorder  F17.200 nicotine polacrilex (NICORETTE) 4 MG gum    nicotine (NICODERM CQ - DOSED IN MG/24 HOURS) 21 mg/24hr patch    DISCONTINUED: nicotine polacrilex (NICORETTE) 4 MG gum    DISCONTINUED: nicotine (NICODERM CQ - DOSED IN MG/24 HOURS) 21 mg/24hr patch    5. Acute metabolic encephalopathy  G93.41 Ambulatory referral to Neurology        Past Psychiatric History:  schizoaffective disorder, bipolar disorder, anxiety, and depression  Past Medical History:  Past Medical History:  Diagnosis Date   Bronchial asthma    Chicken pox    COPD (chronic obstructive pulmonary disease) (HCC)    Depression    Frequent headaches    GERD (gastroesophageal reflux disease)    UTI (lower urinary tract infection)     Past Surgical History:  Procedure Laterality Date   ESOPHAGEAL DILATION     TONSILLECTOMY      Family Psychiatric History:  Daughter Bipolar and SA, Daughter anxiety, Son anxiety  Family History:  Family History  Problem Relation Age of Onset   Other Mother        MVA-Deceased [pt was age 14]   COPD Father        Deceased   Heart attack Maternal Grandmother    Stroke Maternal Grandmother    Brain cancer Maternal Grandmother    Arthritis/Rheumatoid Maternal Grandmother    Heart attack Maternal Grandfather    Stroke Maternal Grandfather    Heart disease Maternal Aunt    Stroke Maternal Aunt        #1   Heart attack Maternal Aunt        #1   Hypertension Maternal Aunt        #1   Arthritis/Rheumatoid Sister    COPD Sister    Heart disease Maternal Uncle    Heart attack Maternal Uncle        x4   Heart disease Brother        #1   Heart attack Brother        #1   Diabetes Brother    Diabetes  Maternal Aunt    Mental retardation Maternal Aunt    Healthy Daughter        x2   Drug abuse Daughter        #1   Drug abuse Daughter        #2   Kidney Stones Daughter        #2   Other Son        Agoraphobia    Social History:  Social History   Socioeconomic History   Marital status: Single    Spouse name: Not on file   Number of children: Not on file   Years of education: Not on file   Highest education level: Not on file  Occupational History   Not on file  Tobacco Use   Smoking status: Every Day   Smokeless tobacco: Never  Substance and Sexual Activity   Alcohol use: Yes   Drug use: Not Currently   Sexual activity: Not Currently  Other Topics Concern   Not on file  Social History Narrative   Right handed    Caffeine- 12 pack of mountain dew    Lives at home with 2 daughters and 3 grandkids   Social Determinants of Health   Financial Resource Strain: Not on file  Food Insecurity: Not on file  Transportation Needs: Not on file  Physical Activity: Not on file  Stress: Not on file  Social Connections: Not on file    Allergies:  Allergies  Allergen Reactions   Penicillins Other (See Comments)    Childhood allergy- Reaction?? Has patient had a PCN reaction causing immediate rash, facial/tongue/throat swelling, SOB or lightheadedness with hypotension: Unknown Has patient had a PCN reaction causing severe rash involving mucus membranes or skin necrosis: Unknown Has patient had a PCN reaction that required hospitalization: Unknown Has patient had a PCN reaction occurring within the last 10 years: Unknown If all of the above answers are "NO", then may proceed with Cephalosporin use.      Metabolic Disorder Labs: Lab Results  Component Value Date   HGBA1C 5.3 07/13/2021   MPG 145.59 05/06/2018   No results found for: "PROLACTIN" Lab Results  Component Value Date   CHOL 203 (H) 05/06/2018   TRIG 73 05/06/2018   HDL 51 05/06/2018   CHOLHDL 4.0  05/06/2018   VLDL 15 05/06/2018   LDLCALC 137 (H) 05/06/2018   Lab Results  Component Value Date   TSH 3.501 02/27/2021   TSH 3.388 10/23/2019    Therapeutic Level Labs: No results found for: "LITHIUM" Lab Results  Component Value Date   VALPROATE 56 03/08/2021   VALPROATE 132 (H) 03/06/2021   No results found for: "CBMZ"  Current Medications: Current Outpatient Medications  Medication Sig Dispense Refill   albuterol (VENTOLIN HFA) 108 (90 Base) MCG/ACT inhaler INHALE 1 PUFF INTO THE LUNGS EVERY 4 HOURS AS NEEDED FOR WHEEZING OR SHORTNESS OF BREATH 8.5 g 1   benztropine (COGENTIN) 1 MG tablet Take 1 mg twice daily 60 tablet 3   doxepin (SINEQUAN) 100 MG capsule Take 1 capsule (100 mg total) by mouth at bedtime. 30 capsule 3   fluticasone (FLONASE) 50 MCG/ACT nasal spray Place 2 sprays into both nostrils daily.     gabapentin (NEURONTIN) 400 MG capsule Take 1 capsule (400 mg total) by mouth 3 (three) times daily. 90 capsule 3   hydrOXYzine (ATARAX) 50 MG tablet TAKE 1 TABLET(50 MG) BY MOUTH THREE TIMES DAILY AS NEEDED 90 tablet 3   losartan (COZAAR) 25 MG tablet TAKE 1 TABLET(25 MG) BY MOUTH DAILY 30 tablet 0   metoprolol succinate (TOPROL-XL) 25 MG 24 hr tablet TAKE 1 TABLET(25 MG) BY MOUTH DAILY 90 tablet 0   nicotine (NICODERM CQ - DOSED IN MG/24 HOURS) 21 mg/24hr patch Place 1 patch (21 mg total) onto the skin daily. 28 patch 3   nicotine polacrilex (NICORETTE) 4 MG gum Take 1 each (4 mg total) by mouth as needed for smoking cessation. 100 tablet 3   OLANZapine (ZYPREXA) 20 MG tablet Take 1 tablet (20 mg total) by mouth at bedtime. 30 tablet 3   No current facility-administered medications for this visit.     Musculoskeletal: Strength & Muscle Tone: within  normal limits and telehealth visit Gait & Station: normal, telehealth visit Patient leans: N/A  Psychiatric Specialty Exam: Review of Systems  Blood pressure (!) 162/134, pulse 82, temperature 98.5 F (36.9 C),  height 5\' 2"  (1.575 m), weight 178 lb 9.6 oz (81 kg), SpO2 99%.Body mass index is 32.67 kg/m.  General Appearance: Well Groomed  Eye Contact:  Good  Speech:  Clear and Coherent and Normal Rate  Volume:  Normal  Mood:  Anxious and Depressed  Affect:  Appropriate and Congruent  Thought Process:  Coherent, Goal Directed and Linear  Orientation:  Full (Time, Place, and Person)  Thought Content: Hallucinations: Auditory Tactile, Ideas of Reference:   Paranoia Delusions, and Paranoid Ideation   Suicidal Thoughts:  No  Homicidal Thoughts:  No  Memory:  Immediate;   Good Recent;   Good Remote;   Good  Judgement:  Good  Insight:  Fair  Psychomotor Activity:  Normal  Concentration:  Concentration: Good and Attention Span: Good  Recall:  Good  Fund of Knowledge: Good  Language: Good  Akathisia:  No  Handed:  Right  AIMS (if indicated): done, 0  Assets:  Communication Skills Desire for Improvement Financial Resources/Insurance Housing Leisure Time Social Support  ADL's:  Intact  Cognition: WNL  Sleep:  Fair   Screenings: AIMS    Flowsheet Row Clinical Support from 03/24/2023 in [facility] Admission (Discharged) [facility]) [facility]  AIMS Total Score 0 0 0      AUDIT    Flowsheet Row Admission (Discharged) from [facility]  Alcohol Use Disorder Identification Test Final Score (AUDIT) 5 4      GAD-7    Flowsheet Row Clinical Support from 03/24/2023 in [facility] Video Visit from 01/02/2023 in [facility] Video Visit from 10/14/2022 in [facility] Video Visit from 07/29/2022 in  [facility] Video Visit from 05/06/2022 in [facility]  Total GAD-7 Score 19 21 16 21 14       PHQ2-9    Flowsheet Row Clinical Support from 03/24/2023 in [facility] Video Visit from 01/02/2023 in [facility] Video Visit from 10/14/2022 in [facility] Video Visit from 07/29/2022 in [facility] Video Visit from 05/06/2022 in [facility]  PHQ-2 Total Score 3 5 3 6 4   PHQ-9 Total Score 17 22 17 24 17       Flowsheet Row Video Visit from 04/09/2021 in [facility] ED to Hosp-Admission (Discharged) from 03/04/2021 in [place] [facility] ED to Hosp-Admission (Discharged) from 02/26/2021 in [place] [facility]LO[facility] PROGRESSIVE CARE AND UROLOGY  C-SSRS RISK CATEGORY Error: Q7 should not be populated when Q6 is No No Risk No Risk        Assessment and Plan: Patient notes that her anxiety and depression has improved since her last visit however notes that her psychosis and sleep continues to decline. In the past patient was diagnosed with metabolic encephalopathy. Provider informed patient that symptoms of psychosis could be related to this.  Patient informed that she has not been seen by neurology since having this diagnosis.  Patient referred to [facility] neurology for further evaluation.  Today patient agreeable to starting Nicorette 4 mg gum.  She will continue her other medication as prescribed.  Provider also encouraged patient to have labs drawn provider ordered CBC, thyroid panel, liver function test, CMP, Lipid panel, and hemoglobin A1c labs patient's last visit and requested that she follow-up with [facility].  She endorsed understanding and agreed.  Patient blood pressure elevated today and was instructed to follow-up with PCP as well as ED.  Patient daughter reports that  she will drive her mother to the ED after her appointment. 1. Schizoaffective disorder, bipolar type (HCC)  Continue- benztropine (COGENTIN) 1 MG tablet; Take 1 mg twice daily  Dispense: 60 tablet; Refill: 3 Continue- OLANZapine (ZYPREXA) 20 MG tablet; Take 1 tablet (20 mg total) by mouth at bedtime.  Dispense: 30 tablet; Refill: 3  2. GAD (generalized anxiety disorder)  - doxepin (SINEQUAN) 100 MG capsule; Take 1 capsule (100 mg total) by mouth at bedtime.  Dispense: 30 capsule; Refill: 3 Continue- hydrOXYzine (ATARAX) 50 MG tablet; TAKE 1 TABLET(50 MG) BY MOUTH THREE TIMES DAILY AS NEEDED  Dispense: 90 tablet; Refill: 3 Continue- gabapentin (NEURONTIN) 400 MG capsule; Take 1 capsule (400 mg total) by mouth 3 (three) times daily.  Dispense: 90 capsule; Refill: 3  3. Insomnia due to other mental disorder  Continue- doxepin (SINEQUAN) 100 MG capsule; Take 1 capsule (100 mg total) by mouth at bedtime.  Dispense: 30 capsule; Refill: 3  4. Tobacco use disorder  Continue- nicotine (NICODERM CQ - DOSED IN MG/24 HOURS) 21 mg/24hr patch; Place 1 patch (21 mg total) onto the skin daily.  Dispense: 28 patch; Refill: 3 - nicotine polacrilex (NICORETTE) 4 MG gum; Take 1 each (4 mg total) by mouth as needed for smoking cessation.  Dispense: 100 tablet; Refill: 3  5. Acute metabolic encephalopathy  - Ambulatory referral to Neurology      Follow-up in 2.5 months Follow-up with      Shanna Cisco, NP 03/24/2023, 4:18 PM

## 2023-03-24 NOTE — ED Notes (Signed)
Patient states leaving the ED with plans to follow up at an urgent care

## 2023-04-01 ENCOUNTER — Ambulatory Visit: Payer: Medicaid Other | Admitting: Family

## 2023-04-16 ENCOUNTER — Telehealth: Payer: Self-pay | Admitting: Family

## 2023-04-16 ENCOUNTER — Encounter: Payer: Medicaid Other | Admitting: Family

## 2023-04-16 NOTE — Telephone Encounter (Signed)
Called patient to reschedule missed appointment.

## 2023-04-16 NOTE — Progress Notes (Signed)
Erroneous encounter-disregard

## 2023-05-05 ENCOUNTER — Ambulatory Visit (INDEPENDENT_AMBULATORY_CARE_PROVIDER_SITE_OTHER): Payer: MEDICAID | Admitting: Family

## 2023-05-05 ENCOUNTER — Encounter: Payer: Self-pay | Admitting: Family

## 2023-05-05 VITALS — BP 129/87 | HR 61 | Temp 98.6°F | Ht 62.0 in | Wt 174.8 lb

## 2023-05-05 DIAGNOSIS — Z1321 Encounter for screening for nutritional disorder: Secondary | ICD-10-CM | POA: Diagnosis not present

## 2023-05-05 DIAGNOSIS — J449 Chronic obstructive pulmonary disease, unspecified: Secondary | ICD-10-CM

## 2023-05-05 DIAGNOSIS — I10 Essential (primary) hypertension: Secondary | ICD-10-CM | POA: Diagnosis not present

## 2023-05-05 DIAGNOSIS — F17200 Nicotine dependence, unspecified, uncomplicated: Secondary | ICD-10-CM

## 2023-05-05 MED ORDER — UMECLIDINIUM-VILANTEROL 62.5-25 MCG/ACT IN AEPB
1.0000 | INHALATION_SPRAY | Freq: Every day | RESPIRATORY_TRACT | 1 refills | Status: DC
Start: 2023-05-05 — End: 2023-07-28

## 2023-05-05 MED ORDER — LOSARTAN POTASSIUM 25 MG PO TABS
25.0000 mg | ORAL_TABLET | Freq: Every day | ORAL | 0 refills | Status: DC
Start: 2023-05-05 — End: 2023-08-04

## 2023-05-05 MED ORDER — NICOTINE POLACRILEX 4 MG MT GUM
4.0000 mg | CHEWING_GUM | OROMUCOSAL | 3 refills | Status: DC | PRN
Start: 2023-05-05 — End: 2023-06-10

## 2023-05-05 MED ORDER — ALBUTEROL SULFATE HFA 108 (90 BASE) MCG/ACT IN AERS
INHALATION_SPRAY | RESPIRATORY_TRACT | 1 refills | Status: DC
Start: 2023-05-05 — End: 2023-08-28

## 2023-05-05 MED ORDER — METOPROLOL SUCCINATE ER 25 MG PO TB24
25.0000 mg | ORAL_TABLET | Freq: Every day | ORAL | 0 refills | Status: DC
Start: 2023-05-05 — End: 2023-10-02

## 2023-05-05 NOTE — Progress Notes (Unsigned)
Patient ID: Michaela Wells, female    DOB: June 15, 1961  MRN: [id number]  CC: Chronic Conditions Follow-Up  Subjective: Michaela Wells is a 62 y.o. female who presents for chronic conditions follow-up.   Her concerns today include: ***  Patient Active Problem List   Diagnosis Date Noted   Sternoclavicular joint pain, right 10/09/2021   Essential hypertension 07/26/2021   Tobacco use disorder 04/09/2021   Marijuana use 04/09/2021   Dehydration 03/04/2021   Transaminitis 03/04/2021   Auditory hallucination 03/04/2021   Visual hallucinations 03/04/2021   Acute metabolic encephalopathy 03/04/2021   AKI (acute kidney injury) (HCC) 02/26/2021   Hypokalemia 02/26/2021   Schizoaffective disorder, bipolar type (HCC) 06/14/2020   Reaction to severe stress, unspecified 10/29/2019   Psychosis (HCC) 10/23/2019   Alcohol dependence, uncomplicated (HCC) 05/14/2018   Nicotine dependence, unspecified, uncomplicated 05/14/2018   Generalized anxiety disorder 05/14/2018   PTSD (post-traumatic stress disorder) 05/06/2018   Bipolar I disorder, current or most recent episode depressed, with psychotic features (HCC) 05/05/2018   Depression 11/07/2014   Marihuana abuse 11/07/2014   Anxiety 11/07/2014   COPD (chronic obstructive pulmonary disease) (HCC) 10/09/2013   GAD (generalized anxiety disorder) 10/09/2013   Smoking trying to quit 10/09/2013   GERD (gastroesophageal reflux disease) 10/09/2013     Current Outpatient Medications on File Prior to Visit  Medication Sig Dispense Refill   doxepin (SINEQUAN) 100 MG capsule Take 1 capsule (100 mg total) by mouth at bedtime. 30 capsule 3   fluticasone (FLONASE) 50 MCG/ACT nasal spray Place 2 sprays into both nostrils daily.     gabapentin (NEURONTIN) 400 MG capsule Take 1 capsule (400 mg total) by mouth 3 (three) times daily. 90 capsule 3   hydrOXYzine (ATARAX) 50 MG tablet TAKE 1 TABLET(50 MG) BY MOUTH THREE TIMES DAILY AS NEEDED 90 tablet 3    nicotine (NICODERM CQ - DOSED IN MG/24 HOURS) 21 mg/24hr patch Place 1 patch (21 mg total) onto the skin daily. 28 patch 3   OLANZapine (ZYPREXA) 20 MG tablet Take 1 tablet (20 mg total) by mouth at bedtime. 30 tablet 3   benztropine (COGENTIN) 1 MG tablet Take 1 mg twice daily (Patient not taking: Reported on 05/05/2023) 60 tablet 3   [DISCONTINUED] carvedilol (COREG) 25 MG tablet Take 1 tablet (25 mg total) by mouth 2 (two) times daily with a meal. (Patient not taking: Reported on 02/26/2021) 60 tablet 2   [DISCONTINUED] omega-3 acid ethyl esters (LOVAZA) 1 g capsule Take 1 capsule (1 g total) by mouth 2 (two) times daily. (Patient not taking: Reported on 02/26/2021) 60 capsule 5   No current facility-administered medications on file prior to visit.    Allergies  Allergen Reactions   Penicillins Other (See Comments)    Childhood allergy- Reaction?? Has patient had a PCN reaction causing immediate rash, facial/tongue/throat swelling, SOB or lightheadedness with hypotension: Unknown Has patient had a PCN reaction causing severe rash involving mucus membranes or skin necrosis: Unknown Has patient had a PCN reaction that required hospitalization: Unknown Has patient had a PCN reaction occurring within the last 10 years: Unknown If all of the above answers are "NO", then may proceed with Cephalosporin use.      Social History   Socioeconomic History   Marital status: Single    Spouse name: Not on file   Number of children: Not on file   Years of education: Not on file   Highest education level: Not on file  Occupational History  Not on file  Tobacco Use   Smoking status: Every Day   Smokeless tobacco: Never  Substance and Sexual Activity   Alcohol use: Yes   Drug use: Not Currently   Sexual activity: Not Currently  Other Topics Concern   Not on file  Social History Narrative   Right handed    Caffeine- 12 pack of mountain dew    Lives at home with 2 daughters and 3 grandkids    Social Determinants of Health   Financial Resource Strain: Not on file  Food Insecurity: Not on file  Transportation Needs: Not on file  Physical Activity: Not on file  Stress: Not on file  Social Connections: Not on file  Intimate Partner Violence: Not on file    Family History  Problem Relation Age of Onset   Other Mother        MVA-Deceased [place] Nephew was age 68]   COPD Father        Deceased   Heart attack Maternal Grandmother    Stroke Maternal Grandmother    Brain cancer Maternal Grandmother    Arthritis/Rheumatoid Maternal Grandmother    Heart attack Maternal Grandfather    Stroke Maternal Grandfather    Heart disease Maternal Aunt    Stroke Maternal Aunt        #1   Heart attack Maternal Aunt        #1   Hypertension Maternal Aunt        #1   Arthritis/Rheumatoid Sister    COPD Sister    Heart disease Maternal Uncle    Heart attack Maternal Uncle        x4   Heart disease Brother        #1   Heart attack Brother        #1   Diabetes Brother    Diabetes Maternal Aunt    Mental retardation Maternal Aunt    Healthy Daughter        x2   Drug abuse Daughter        #1   Drug abuse Daughter        #2   Kidney Stones Daughter        #2   Other Son        Agoraphobia    Past Surgical History:  Procedure Laterality Date   ESOPHAGEAL DILATION     TONSILLECTOMY      ROS: Review of Systems Negative except as stated above  PHYSICAL EXAM: BP 129/87   Pulse 61   Temp 98.6 F (37 C) (Oral)   Ht 5\' 2"  (1.575 m)   Wt 174 lb 12.8 oz (79.3 kg)   SpO2 92%   BMI 31.97 kg/m   Physical Exam  {female adult master:310786} {female adult master:310785}     Latest Ref Rng & Units 03/24/2023    4:42 PM 03/27/2021   12:00 AM 03/11/2021    7:18 AM  CMP  Glucose 70 - 99 mg/dL 85  80  90   BUN 8 - 23 mg/dL 10  8  13    Creatinine 0.44 - 1.00 mg/dL 6.04  5.40  9.81   Sodium 135 - 145 mmol/L 139  140  140   Potassium 3.5 - 5.1 mmol/L 3.5  4.9  4.1   Chloride 98 -  111 mmol/L 102  108  104   CO2 22 - 32 mmol/L 25  20  28    Calcium 8.9 - 10.3 mg/dL 9.2  8.6  8.1   Total Protein 6.5 - 8.1 g/dL   6.0   Total Bilirubin 0.3 - 1.2 mg/dL   0.5   Alkaline Phos 38 - 126 U/L   53   AST 15 - 41 U/L   16   ALT 0 - 44 U/L   18    Lipid Panel     Component Value Date/Time   CHOL 203 (H) 05/06/2018 0627   TRIG 73 05/06/2018 0627   HDL 51 05/06/2018 0627   CHOLHDL 4.0 05/06/2018 0627   VLDL 15 05/06/2018 0627   LDLCALC 137 (H) 05/06/2018 0627    CBC    Component Value Date/Time   WBC 6.9 03/24/2023 1642   RBC 4.65 03/24/2023 1642   HGB 13.8 03/24/2023 1642   HGB 12.5 03/27/2021 0000   HCT 42.2 03/24/2023 1642   HCT 37.5 03/27/2021 0000   PLT 329 03/24/2023 1642   PLT 362 03/27/2021 0000   MCV 90.8 03/24/2023 1642   MCV 91 03/27/2021 0000   MCH 29.7 03/24/2023 1642   MCHC 32.7 03/24/2023 1642   RDW 13.0 03/24/2023 1642   RDW 15.2 03/27/2021 0000   LYMPHSABS 2.1 03/24/2023 1642   MONOABS 0.5 03/24/2023 1642   EOSABS 0.0 03/24/2023 1642   BASOSABS 0.0 03/24/2023 1642    ASSESSMENT AND PLAN:  There are no diagnoses linked to this encounter.   Patient was given the opportunity to ask questions.  Patient verbalized understanding of the plan and was able to repeat key elements of the plan. Patient was given clear instructions to go to Emergency Department or return to medical center if symptoms don't improve, worsen, or new problems develop.The patient verbalized understanding.   Orders Placed This Encounter  Procedures   Basic Metabolic Panel   Vitamin D, 25-hydroxy     Requested Prescriptions   Signed Prescriptions Disp Refills   albuterol (VENTOLIN HFA) 108 (90 Base) MCG/ACT inhaler 8.5 g 1    Sig: INHALE 1 PUFF INTO THE LUNGS EVERY 4 HOURS AS NEEDED FOR WHEEZING OR SHORTNESS OF BREATH   nicotine polacrilex (NICORETTE) 4 MG gum 100 tablet 3    Sig: Take 1 each (4 mg total) by mouth as needed for smoking cessation.   losartan  (COZAAR) 25 MG tablet 90 tablet 0    Sig: Take 1 tablet (25 mg total) by mouth daily for 90 doses.   metoprolol succinate (TOPROL-XL) 25 MG 24 hr tablet 90 tablet 0    Sig: Take 1 tablet (25 mg total) by mouth daily.   umeclidinium-vilanterol (ANORO ELLIPTA) 62.5-25 MCG/ACT AEPB 60 each 1    Sig: Inhale 1 puff into the lungs daily.    Return in about 3 months (around 08/04/2023) for Follow-Up or next available chronic conditions.  [name], NP

## 2023-05-05 NOTE — Progress Notes (Unsigned)
Pt states she needs Anoro prescription.    States she keeps having leg cramps in morning and night time, states she tried bananas and water and it doesn't help.

## 2023-05-06 ENCOUNTER — Other Ambulatory Visit: Payer: Self-pay | Admitting: Family

## 2023-05-06 DIAGNOSIS — E559 Vitamin D deficiency, unspecified: Secondary | ICD-10-CM | POA: Insufficient documentation

## 2023-05-06 LAB — BASIC METABOLIC PANEL
BUN/Creatinine Ratio: 12 (ref 12–28)
BUN: 11 mg/dL (ref 8–27)
CO2: 21 mmol/L (ref 20–29)
Calcium: 8.5 mg/dL — ABNORMAL LOW (ref 8.7–10.3)
Chloride: 108 mmol/L — ABNORMAL HIGH (ref 96–106)
Creatinine, Ser: 0.94 mg/dL (ref 0.57–1.00)
Glucose: 78 mg/dL (ref 70–99)
Potassium: 4 mmol/L (ref 3.5–5.2)
Sodium: 143 mmol/L (ref 134–144)
eGFR: 69 mL/min/{1.73_m2} (ref >59)

## 2023-05-06 LAB — VITAMIN D 25 HYDROXY (VIT D DEFICIENCY, FRACTURES): Vit D, 25-Hydroxy: 11.3 ng/mL — ABNORMAL LOW (ref 30.0–100.0)

## 2023-05-06 MED ORDER — VITAMIN D (ERGOCALCIFEROL) 1.25 MG (50000 UNIT) PO CAPS
50000.0000 [IU] | ORAL_CAPSULE | ORAL | 0 refills | Status: AC
Start: 2023-05-06 — End: 2023-07-23

## 2023-05-07 ENCOUNTER — Other Ambulatory Visit: Payer: Self-pay | Admitting: Family

## 2023-05-07 DIAGNOSIS — L210 Seborrhea capitis: Secondary | ICD-10-CM

## 2023-05-08 ENCOUNTER — Telehealth: Payer: Self-pay | Admitting: Family

## 2023-05-08 NOTE — Telephone Encounter (Signed)
Medication Refill - Medication: KETOCONAZOLE, TOPICAL, 1 % SHAM POO  Has the patient contacted their pharmacy? Yes.   Pt states she saw the dr the other day and was supposed to get this shampoo called in Preferred Pharmacy (with phone number or street name): [id number] - [place], [place] - [address]  Has the patient been seen for an appointment in the last year OR does the patient have an upcoming appointment? Yes.    Agent: Please be advised that RX refills may take up to 3 business days. We ask that you follow-up with your pharmacy.

## 2023-05-29 ENCOUNTER — Other Ambulatory Visit: Payer: Self-pay | Admitting: Family

## 2023-05-29 DIAGNOSIS — L210 Seborrhea capitis: Secondary | ICD-10-CM

## 2023-05-29 NOTE — Telephone Encounter (Signed)
Requested medications are due for refill today.  unsure  Requested medications are on the active medications list.  no  Last refill. 07/13/2021  Future visit scheduled.   no  Notes to clinic.  Refill not delegated. Med not on med list.    Requested Prescriptions  Pending Prescriptions Disp Refills   ketoconazole (NIZORAL) 2 % shampoo [Pharmacy Med Name: KETOCONAZOLE 2% SHAMPOO 120ML] 240 mL     Sig: APPLY 1 APPLICATION TO THE SCALP ONCE A WEEK     Not Delegated - Over the Counter: OTC 2 Failed - 05/29/2023  2:20 PM      Failed - This refill cannot be delegated      Passed - Valid encounter within last 12 months    Recent Outpatient Visits           3 weeks ago Primary hypertension   [facility], [place], NP   1 year ago Essential hypertension   [facility], [place], NP   1 year ago Essential hypertension   [facility], Cornelius Moras, RPH-CPP   1 year ago Essential hypertension   [facility], Gildardo Pounds, NP   1 year ago Essential hypertension   [facility], Salomon Fick, NP

## 2023-06-05 ENCOUNTER — Encounter (HOSPITAL_COMMUNITY): Payer: MEDICAID | Admitting: Psychiatry

## 2023-06-05 ENCOUNTER — Telehealth (HOSPITAL_COMMUNITY): Payer: Self-pay | Admitting: Psychiatry

## 2023-06-05 NOTE — Telephone Encounter (Signed)
Patient notes that she has been taking Geodon 20 mg twice daily. She is unaware where she got it from and notes that it makes her tired. Patient notes that she continues to have [facility]. Provider reccommended discontinuing it. She notes that she ran out 4-5 days ago. Patient also notes that she has not been able to afford marijuana and has not been drinking alcohol.  Patient also informed Clinical research associate that recently she has seizure-like activities.  She notes that she did not follow-up with her PCP.  Provider encouraged her to do this and notes that if it happens again to go to the emergency department or urgent care.  She endorsed understanding.  She notes that she has enough of her medications to last until her next visit.  Not a concern at this time.

## 2023-06-10 ENCOUNTER — Telehealth (INDEPENDENT_AMBULATORY_CARE_PROVIDER_SITE_OTHER): Payer: MEDICAID | Admitting: Psychiatry

## 2023-06-10 ENCOUNTER — Encounter (HOSPITAL_COMMUNITY): Payer: Self-pay | Admitting: Psychiatry

## 2023-06-10 DIAGNOSIS — F99 Mental disorder, not otherwise specified: Secondary | ICD-10-CM

## 2023-06-10 DIAGNOSIS — F17200 Nicotine dependence, unspecified, uncomplicated: Secondary | ICD-10-CM

## 2023-06-10 DIAGNOSIS — F17210 Nicotine dependence, cigarettes, uncomplicated: Secondary | ICD-10-CM | POA: Diagnosis not present

## 2023-06-10 DIAGNOSIS — F411 Generalized anxiety disorder: Secondary | ICD-10-CM | POA: Diagnosis not present

## 2023-06-10 DIAGNOSIS — F5105 Insomnia due to other mental disorder: Secondary | ICD-10-CM

## 2023-06-10 DIAGNOSIS — F250 Schizoaffective disorder, bipolar type: Secondary | ICD-10-CM

## 2023-06-10 MED ORDER — GABAPENTIN 400 MG PO CAPS
400.0000 mg | ORAL_CAPSULE | Freq: Three times a day (TID) | ORAL | 3 refills | Status: DC
Start: 2023-06-10 — End: 2023-08-11

## 2023-06-10 MED ORDER — DOXEPIN HCL 150 MG PO CAPS
150.0000 mg | ORAL_CAPSULE | Freq: Every day | ORAL | 3 refills | Status: DC
Start: 2023-06-10 — End: 2023-08-11

## 2023-06-10 MED ORDER — OLANZAPINE 20 MG PO TABS
20.0000 mg | ORAL_TABLET | Freq: Every day | ORAL | 3 refills | Status: DC
Start: 2023-06-10 — End: 2023-08-11

## 2023-06-10 MED ORDER — NICOTINE 21 MG/24HR TD PT24
21.0000 mg | MEDICATED_PATCH | Freq: Every day | TRANSDERMAL | 3 refills | Status: DC
Start: 2023-06-10 — End: 2023-08-11

## 2023-06-10 MED ORDER — BENZTROPINE MESYLATE 1 MG PO TABS
ORAL_TABLET | ORAL | 3 refills | Status: DC
Start: 2023-06-10 — End: 2023-08-11

## 2023-06-10 MED ORDER — HYDROXYZINE HCL 50 MG PO TABS
ORAL_TABLET | ORAL | 3 refills | Status: DC
Start: 2023-06-10 — End: 2023-08-11

## 2023-06-10 NOTE — Progress Notes (Signed)
BH MD/PA/NP OP Progress Note  Virtual Visit via Video Note  I connected with Michaela Wells on 06/10/23 at 12:30 PM EDT by a video enabled telemedicine application and verified that I am speaking with the correct person using two identifiers.  Location: Patient: Home Provider: Clinic   I discussed the limitations of evaluation and management by telemedicine and the availability of in person appointments. The patient expressed understanding and agreed to proceed.  I provided 30 minutes of non-face-to-face time during this encounter.     06/10/2023 3:23 PM Michaela Wells  MRN:  [id number]  Chief Complaint: "I thought I was having a seizure"   HPI: 62 year old female seen today for follow-up psychiatric evaluation.  She has a psychiatric history of schizoaffective disorder, bipolar disorder, marijuana use, anxiety, and depression. She is currently she is on Doxepin 100 mg nightly, hydroxyzine 50mg  three times daily, gabapentin 400 mg three times daily, cogentin 1 mg twice daily, NicoDerm CQ 21 mg patches, and Zyprexa 20 mg nightly. Today patient informed writer that her medications are somewhat effective in managing her psychiatric conditions.    Today she was well-groomed, pleasant, cooperative, engaged in conversation, and maintained eye contact.  She informed Clinical research associate that recently she thought she experienced a seizure.  Patient informed writer that she was having intense shaking.  She denies losing consciousness.  Provider asked patient if she has scheduled appoint with her PCP.  She notes that she has an upcoming appointment in December.  Patient informed Clinical research associate that recently she was taking Geodon with her Zyprexa.  Provider informed patient to discontinue this regiment on 06/05/2023.  Patient notes that since discontinuing Geodon she continues to experience symptoms of psychosis.  She informed Clinical research associate that her voices/neighbors speaking negatively of her.  She also notes that recently they  have been requesting that she asked for something to eat prior to eating it.  Patient informed writer that her neighbors cause her intense throat and bodily pain.  She notes that over-the-counter medications and gabapentin is not effective in managing this.    Since her last visit she informed writer that her depression has worsened and notes that she continues to be anxious.  Provider conducted a GAD-7 and patient scored a 16, at her last visit she scored a 21.  Provider also conducted a PHQ-9 of he scored 19, at her last visit she scored a 22.  She endorses fluctuations in sleep and decreased appetite.  Patient continues to be paranoid about her neighbors in [place] attempting to harm her.  Her psychosis has not improved likely due to her metabolic encephalopathy.    To cope with above stressors patient notes that she smokes a pack of cigarettes daily.  She informed Clinical research associate that when her voices/neighbors are less problematic she can smoke a pack every 2 days.  She does Archivist that her Nicorette patch is somewhat effective but notes that her insurance cannot cover her Nicorette gum.    Today doxepin 100 mg increased to 150 mg to help manage anxiety, depression, and sleep.  She will continue her other medications as prescribed.  No other concerns noted at this time.    Visit Diagnosis:    ICD-10-CM   1. GAD (generalized anxiety disorder)  F41.1 doxepin (SINEQUAN) 150 MG capsule    gabapentin (NEURONTIN) 400 MG capsule    hydrOXYzine (ATARAX) 50 MG tablet    2. Insomnia due to other mental disorder  F51.05 doxepin (SINEQUAN) 150 MG capsule  F99     3. Tobacco use disorder  F17.200 nicotine (NICODERM CQ - DOSED IN MG/24 HOURS) 21 mg/24hr patch    4. Schizoaffective disorder, bipolar type (HCC)  F25.0 OLANZapine (ZYPREXA) 20 MG tablet    benztropine (COGENTIN) 1 MG tablet         Past Psychiatric History:  schizoaffective disorder, bipolar disorder, anxiety, and depression  Past  Medical History:  Past Medical History:  Diagnosis Date   Bronchial asthma    Chicken pox    COPD (chronic obstructive pulmonary disease) (HCC)    Depression    Frequent headaches    GERD (gastroesophageal reflux disease)    Hypertension    UTI (lower urinary tract infection)     Past Surgical History:  Procedure Laterality Date   ESOPHAGEAL DILATION     TONSILLECTOMY      Family Psychiatric History:  Daughter Bipolar and SA, Daughter anxiety, Son anxiety  Family History:  Family History  Problem Relation Age of Onset   Other Mother        MVA-Deceased [pt was age 77]   COPD Father        Deceased   Heart attack Maternal Grandmother    Stroke Maternal Grandmother    Brain cancer Maternal Grandmother    Arthritis/Rheumatoid Maternal Grandmother    Heart attack Maternal Grandfather    Stroke Maternal Grandfather    Heart disease Maternal Aunt    Stroke Maternal Aunt        #1   Heart attack Maternal Aunt        #1   Hypertension Maternal Aunt        #1   Arthritis/Rheumatoid Sister    COPD Sister    Heart disease Maternal Uncle    Heart attack Maternal Uncle        x4   Heart disease Brother        #1   Heart attack Brother        #1   Diabetes Brother    Diabetes Maternal Aunt    Mental retardation Maternal Aunt    Healthy Daughter        x2   Drug abuse Daughter        #1   Drug abuse Daughter        #2   Kidney Stones Daughter        #2   Other Son        Agoraphobia    Social History:  Social History   Socioeconomic History   Marital status: Single    Spouse name: Not on file   Number of children: Not on file   Years of education: Not on file   Highest education level: Not on file  Occupational History   Not on file  Tobacco Use   Smoking status: Every Day   Smokeless tobacco: Never  Substance and Sexual Activity   Alcohol use: Yes   Drug use: Not Currently   Sexual activity: Not Currently  Other Topics Concern   Not on file  Social  History Narrative   Right handed    Caffeine- 12 pack of mountain dew    Lives at home with 2 daughters and 3 grandkids   Social Determinants of Health   Financial Resource Strain: Not on file  Food Insecurity: Not on file  Transportation Needs: Not on file  Physical Activity: Not on file  Stress: Not on file  Social Connections: Not on file  Allergies:  Allergies  Allergen Reactions   Penicillins Other (See Comments)    Childhood allergy- Reaction?? Has patient had a PCN reaction causing immediate rash, facial/tongue/throat swelling, SOB or lightheadedness with hypotension: Unknown Has patient had a PCN reaction causing severe rash involving mucus membranes or skin necrosis: Unknown Has patient had a PCN reaction that required hospitalization: Unknown Has patient had a PCN reaction occurring within the last 10 years: Unknown If all of the above answers are "NO", then may proceed with Cephalosporin use.      Metabolic Disorder Labs: Lab Results  Component Value Date   HGBA1C 5.3 07/13/2021   MPG 145.59 05/06/2018   No results found for: "PROLACTIN" Lab Results  Component Value Date   CHOL 203 (H) 05/06/2018   TRIG 73 05/06/2018   HDL 51 05/06/2018   CHOLHDL 4.0 05/06/2018   VLDL 15 05/06/2018   LDLCALC 137 (H) 05/06/2018   Lab Results  Component Value Date   TSH 3.501 02/27/2021   TSH 3.388 10/23/2019    Therapeutic Level Labs: No results found for: "LITHIUM" Lab Results  Component Value Date   VALPROATE 56 03/08/2021   VALPROATE 132 (H) 03/06/2021   No results found for: "CBMZ"  Current Medications: Current Outpatient Medications  Medication Sig Dispense Refill   albuterol (VENTOLIN HFA) 108 (90 Base) MCG/ACT inhaler INHALE 1 PUFF INTO THE LUNGS EVERY 4 HOURS AS NEEDED FOR WHEEZING OR SHORTNESS OF BREATH 8.5 g 1   benztropine (COGENTIN) 1 MG tablet Take 1 mg twice daily 60 tablet 3   doxepin (SINEQUAN) 150 MG capsule Take 1 capsule (150 mg total) by  mouth at bedtime. 30 capsule 3   fluticasone (FLONASE) 50 MCG/ACT nasal spray Place 2 sprays into both nostrils daily.     gabapentin (NEURONTIN) 400 MG capsule Take 1 capsule (400 mg total) by mouth 3 (three) times daily. 90 capsule 3   hydrOXYzine (ATARAX) 50 MG tablet TAKE 1 TABLET(50 MG) BY MOUTH THREE TIMES DAILY AS NEEDED 90 tablet 3   losartan (COZAAR) 25 MG tablet Take 1 tablet (25 mg total) by mouth daily for 90 doses. 90 tablet 0   metoprolol succinate (TOPROL-XL) 25 MG 24 hr tablet Take 1 tablet (25 mg total) by mouth daily. 90 tablet 0   nicotine (NICODERM CQ - DOSED IN MG/24 HOURS) 21 mg/24hr patch Place 1 patch (21 mg total) onto the skin daily. 28 patch 3   OLANZapine (ZYPREXA) 20 MG tablet Take 1 tablet (20 mg total) by mouth at bedtime. 30 tablet 3   umeclidinium-vilanterol (ANORO ELLIPTA) 62.5-25 MCG/ACT AEPB Inhale 1 puff into the lungs daily. 60 each 1   Vitamin D, Ergocalciferol, (DRISDOL) 1.25 MG (50000 UNIT) CAPS capsule Take 1 capsule (50,000 Units total) by mouth every 7 (seven) days for 12 doses. 12 capsule 0   No current facility-administered medications for this visit.     Musculoskeletal: Strength & Muscle Tone: within normal limits and telehealth visit Gait & Station: normal, telehealth visit Patient leans: N/A  Psychiatric Specialty Exam: Review of Systems  There were no vitals taken for this visit.There is no height or weight on file to calculate BMI.  General Appearance: Well Groomed  Eye Contact:  Good  Speech:  Clear and Coherent and Normal Rate  Volume:  Normal  Mood:  Anxious and Depressed  Affect:  Appropriate and Congruent  Thought Process:  Coherent, Goal Directed and Linear  Orientation:  Full (Time, Place, and Person)  Thought Content: Hallucinations:  Auditory Tactile, Ideas of Reference:   Paranoia Delusions, and Paranoid Ideation   Suicidal Thoughts:  No  Homicidal Thoughts:  No  Memory:  Immediate;   Good Recent;   Good Remote;   Good   Judgement:  Good  Insight:  Fair  Psychomotor Activity:  Normal  Concentration:  Concentration: Good and Attention Span: Good  Recall:  Good  Fund of Knowledge: Good  Language: Good  Akathisia:  No  Handed:  Right  AIMS (if indicated): not done  Assets:  Communication Skills Desire for Improvement Financial Resources/Insurance Housing Leisure Time Social Support  ADL's:  Intact  Cognition: WNL  Sleep:  Good   Screenings: AIMS    Flowsheet Row Clinical Support from 03/24/2023 in [facility] Admission (Discharged) [facility]) [facility]  AIMS Total Score 0 0 0      AUDIT    Flowsheet Row Admission (Discharged) [facility]  Alcohol Use Disorder Identification Test Final Score (AUDIT) 5 4      GAD-7    Flowsheet Row Video Visit from 06/10/2023 in [facility] Clinical Support from 03/24/2023 in [facility] Video Visit from 01/02/2023 in [facility] Video Visit from 10/14/2022 in [facility] Video Visit from 07/29/2022 in [facility]  Total GAD-7 Score 16 19 21 16 21       PHQ2-9    Flowsheet Row Video Visit from 06/10/2023 in [facility] Clinical Support from 03/24/2023 in [facility] Video Visit from 01/02/2023 in [facility] Video Visit from 10/14/2022 in [facility] Video Visit from 07/29/2022 in [facility]  PHQ-2 Total Score 6 3 5 3 6   PHQ-9 Total Score 19 17 22 17 24        Flowsheet Row ED from 03/24/2023 in [facility] at [facility] Video Visit from 04/09/2021 in [facility] ED to Hosp-Admission (Discharged) from 03/04/2021 in [facility]  C-SSRS RISK CATEGORY No Risk Error: Q7 should not be populated when Q6 is No No Risk        Assessment and Plan: Patient notes that she continues to be anxious and depressed. She also continues to experience AH and paranoia. Today doxepin 100 mg increased to 150 mg to help manage anxiety, depression, and sleep.  She will continue her other medications as prescribed   1. GAD (generalized anxiety disorder)  Increased- doxepin (SINEQUAN) 150 MG capsule; Take 1 capsule (150 mg total) by mouth at bedtime.  Dispense: 30 capsule; Refill: 3 Continue- gabapentin (NEURONTIN) 400 MG capsule; Take 1 capsule (400 mg total) by mouth 3 (three) times daily.  Dispense: 90 capsule; Refill: 3 Continue- hydrOXYzine (ATARAX) 50 MG tablet; TAKE 1 TABLET(50 MG) BY MOUTH THREE TIMES DAILY AS NEEDED  Dispense: 90 tablet; Refill: 3  2. Insomnia due to other mental disorder  Increased- doxepin (SINEQUAN) 150 MG capsule; Take 1 capsule (150 mg total) by mouth at bedtime.  Dispense: 30 capsule; Refill: 3  3. Tobacco use disorder  Continue- nicotine (NICODERM CQ - DOSED IN MG/24 HOURS) 21 mg/24hr patch; Place 1  patch (21 mg total) onto the skin daily.  Dispense: 28 patch; Refill: 3  4. Schizoaffective disorder, bipolar type (HCC)  Continue- OLANZapine (ZYPREXA) 20 MG tablet; Take 1 tablet (20 mg total) by mouth at bedtime.  Dispense: 30 tablet; Refill: 3 Continue- benztropine (COGENTIN) 1 MG tablet; Take 1 mg twice daily  Dispense: 60 tablet; Refill: 3      Follow-up in 2.5 months Follow-up with      Shanna Cisco, NP 06/10/2023, 3:23 PM

## 2023-07-19 ENCOUNTER — Other Ambulatory Visit: Payer: Self-pay | Admitting: Family

## 2023-07-19 DIAGNOSIS — L210 Seborrhea capitis: Secondary | ICD-10-CM

## 2023-07-21 ENCOUNTER — Telehealth: Payer: Self-pay | Admitting: Family

## 2023-07-21 ENCOUNTER — Other Ambulatory Visit: Payer: Self-pay | Admitting: Family

## 2023-07-21 DIAGNOSIS — L210 Seborrhea capitis: Secondary | ICD-10-CM

## 2023-07-21 MED ORDER — KETOCONAZOLE 2 % EX SHAM
MEDICATED_SHAMPOO | CUTANEOUS | 0 refills | Status: DC
Start: 2023-07-21 — End: 2023-08-04

## 2023-07-21 NOTE — Telephone Encounter (Signed)
Complete

## 2023-07-21 NOTE — Telephone Encounter (Signed)
Pt is calling in because she says Michaela Wells was supposed to send in some shampoo for pt for dandruff and it was never called in. Pt says the shampoo starts with a "K" and is a long word but she doesn't know the name of it. Pt says [facility] sent a request to get it refilled and it was denied and pt wants to know why. Pt would like it sent to [facility] in [place]

## 2023-07-24 ENCOUNTER — Other Ambulatory Visit: Payer: Self-pay | Admitting: Family

## 2023-07-24 DIAGNOSIS — J449 Chronic obstructive pulmonary disease, unspecified: Secondary | ICD-10-CM

## 2023-07-27 ENCOUNTER — Other Ambulatory Visit: Payer: Self-pay | Admitting: Family

## 2023-07-27 DIAGNOSIS — E559 Vitamin D deficiency, unspecified: Secondary | ICD-10-CM

## 2023-07-28 ENCOUNTER — Other Ambulatory Visit: Payer: Self-pay

## 2023-07-28 DIAGNOSIS — J449 Chronic obstructive pulmonary disease, unspecified: Secondary | ICD-10-CM

## 2023-07-28 MED ORDER — UMECLIDINIUM-VILANTEROL 62.5-25 MCG/ACT IN AEPB
1.0000 | INHALATION_SPRAY | Freq: Every day | RESPIRATORY_TRACT | 1 refills | Status: DC
Start: 2023-07-28 — End: 2023-09-25

## 2023-07-30 ENCOUNTER — Other Ambulatory Visit: Payer: MEDICAID

## 2023-08-02 ENCOUNTER — Other Ambulatory Visit: Payer: Self-pay | Admitting: Family

## 2023-08-02 DIAGNOSIS — L210 Seborrhea capitis: Secondary | ICD-10-CM

## 2023-08-02 DIAGNOSIS — I10 Essential (primary) hypertension: Secondary | ICD-10-CM

## 2023-08-04 ENCOUNTER — Other Ambulatory Visit: Payer: Self-pay

## 2023-08-04 DIAGNOSIS — I10 Essential (primary) hypertension: Secondary | ICD-10-CM

## 2023-08-04 DIAGNOSIS — L210 Seborrhea capitis: Secondary | ICD-10-CM

## 2023-08-04 MED ORDER — KETOCONAZOLE 2 % EX SHAM
MEDICATED_SHAMPOO | CUTANEOUS | 0 refills | Status: AC
Start: 2023-08-04 — End: ?

## 2023-08-04 MED ORDER — LOSARTAN POTASSIUM 25 MG PO TABS
25.0000 mg | ORAL_TABLET | Freq: Every day | ORAL | 0 refills | Status: DC
Start: 2023-08-04 — End: 2023-11-20

## 2023-08-11 ENCOUNTER — Encounter (HOSPITAL_COMMUNITY): Payer: Self-pay | Admitting: Psychiatry

## 2023-08-11 ENCOUNTER — Telehealth (HOSPITAL_COMMUNITY): Payer: MEDICAID | Admitting: Psychiatry

## 2023-08-11 DIAGNOSIS — F411 Generalized anxiety disorder: Secondary | ICD-10-CM

## 2023-08-11 DIAGNOSIS — F5105 Insomnia due to other mental disorder: Secondary | ICD-10-CM

## 2023-08-11 DIAGNOSIS — F17200 Nicotine dependence, unspecified, uncomplicated: Secondary | ICD-10-CM

## 2023-08-11 DIAGNOSIS — F250 Schizoaffective disorder, bipolar type: Secondary | ICD-10-CM

## 2023-08-11 MED ORDER — NICOTINE 21 MG/24HR TD PT24
21.0000 mg | MEDICATED_PATCH | Freq: Every day | TRANSDERMAL | 3 refills | Status: AC
Start: 2023-08-11 — End: ?

## 2023-08-11 MED ORDER — DOXEPIN HCL 150 MG PO CAPS
150.0000 mg | ORAL_CAPSULE | Freq: Every day | ORAL | 3 refills | Status: DC
Start: 2023-08-11 — End: 2023-12-31

## 2023-08-11 MED ORDER — GABAPENTIN 400 MG PO CAPS
400.0000 mg | ORAL_CAPSULE | Freq: Three times a day (TID) | ORAL | 3 refills | Status: AC
Start: 2023-08-11 — End: ?

## 2023-08-11 MED ORDER — OLANZAPINE 20 MG PO TABS: 20.0000 mg | ORAL_TABLET | Freq: Every day | ORAL | 3 refills | Status: AC

## 2023-08-11 MED ORDER — HALOPERIDOL 1 MG PO TABS: 1.0000 mg | ORAL_TABLET | Freq: Every evening | ORAL | 3 refills | Status: DC

## 2023-08-11 MED ORDER — BENZTROPINE MESYLATE 1 MG PO TABS
ORAL_TABLET | ORAL | 3 refills | Status: AC
Start: 2023-08-11 — End: ?

## 2023-08-11 NOTE — Progress Notes (Signed)
BH MD/PA/NP OP Progress Note  Virtual Visit via Video Note  I connected with Michaela Wells on 08/11/23 at  2:00 PM EST by a video enabled telemedicine application and verified that I am speaking with the correct person using two identifiers.  Location: Patient: Home Provider: Clinic   I discussed the limitations of evaluation and management by telemedicine and the availability of in person appointments. The patient expressed understanding and agreed to proceed.  I provided 30 minutes of non-face-to-face time during this encounter.     08/11/2023 2:44 PM Michaela Wells  MRN:  [id number]  Chief Complaint: "The neighbors are getting restless and starting to hurt me again"   HPI: 62 year old female seen today for follow-up psychiatric evaluation.  She has a psychiatric history of schizoaffective disorder, bipolar disorder, marijuana use, anxiety, and depression. She is currently she is on Doxepin 150 mg nightly, hydroxyzine 50mg  three times daily, gabapentin 400 mg three times daily, cogentin 1 mg twice daily, NicoDerm CQ 21 mg patches, and Zyprexa 20 mg nightly. Today patient informed writer that she discontinued hydroxyzine as she found it ineffective.  She reports that her other  medications are somewhat effective in managing her psychiatric conditions.    Today she was well-groomed, pleasant, cooperative, engaged in conversation, and maintained eye contact.  Patient presents with delusional thinking and psychosis.  She informed Clinical research associate that her neighbors are getting restless and starting to hurt her again.  She notes that they tamper with her food which makes it difficult to swallow.  She also notes that they are interfering with her stomach and kidneys.  Provider asked patient if she continues to utilize marijuana.  She informed Clinical research associate that she takes marijuana daily.  She denies alcohol use.  Provider informed patient that this can exacerbate her mental health and encouraged her to  discontinue it.  She notes that she will try after the Christmas holiday.  Patient quantifies her bodily pain today as 3 out of 10.  She informed Clinical research associate that when her neighbors hurt her her pain gets up to 10/10.    Patient reports that the above exacerbates her anxiety and depression.  Provider conducted a GAD-7 and patient scored a 20, at her last visit she scored a 16.  Provider also conducted PHQ-9 and patient scored a 22, at her last visit she scored a 19.  She endorsed increased appetite and notes that her weight has increased as well.  Today she endorses passive SI but denies wanting to harm herself.  She denies SI/HI.  Patient does note that she is irritable, distracted, and have racing thoughts.  Since increasing doxepin she notes that her sleep has improved.  Patient has a history of metabolic encephalopathy which may be contributing to her delusional thinking and psychosis.  Patient was on Geodon in the past but discontinued it.  She is uncertain why she discontinued it.  Provider recommended starting a low-dose of Haldol as adjunct with her current medication.  She was agreeable to starting Haldol 1 mg nightly to help manage symptoms of psychosis and mood.  Provider attempted to call patient's daughter with her permission to notify them of the medication changes however got the voicemail.  Provider did leave a voicemail requesting that they call to have questions or concerns.  At this time she does not wish to continue hydroxyzine.  Patient will continue other medications as prescribed.  Potential side effects of medication and risks vs benefits of treatment vs non-treatment were explained  and discussed. All questions were answered.  No other concerns at this time.   Visit Diagnosis:    ICD-10-CM   1. Schizoaffective disorder, bipolar type (HCC)  F25.0 haloperidol (HALDOL) 1 MG tablet    benztropine (COGENTIN) 1 MG tablet    OLANZapine (ZYPREXA) 20 MG tablet    2. GAD (generalized anxiety  disorder)  F41.1 doxepin (SINEQUAN) 150 MG capsule    gabapentin (NEURONTIN) 400 MG capsule    3. Insomnia due to other mental disorder  F51.05 doxepin (SINEQUAN) 150 MG capsule   F99     4. Tobacco use disorder  F17.200 nicotine (NICODERM CQ - DOSED IN MG/24 HOURS) 21 mg/24hr patch          Past Psychiatric History:  schizoaffective disorder, bipolar disorder, anxiety, and depression  Past Medical History:  Past Medical History:  Diagnosis Date   Bronchial asthma    Chicken pox    COPD (chronic obstructive pulmonary disease) (HCC)    Depression    Frequent headaches    GERD (gastroesophageal reflux disease)    Hypertension    UTI (lower urinary tract infection)     Past Surgical History:  Procedure Laterality Date   ESOPHAGEAL DILATION     TONSILLECTOMY      Family Psychiatric History:  Daughter Bipolar and SA, Daughter anxiety, Son anxiety  Family History:  Family History  Problem Relation Age of Onset   Other Mother        MVA-Deceased [pt was age 80]   COPD Father        Deceased   Heart attack Maternal Grandmother    Stroke Maternal Grandmother    Brain cancer Maternal Grandmother    Arthritis/Rheumatoid Maternal Grandmother    Heart attack Maternal Grandfather    Stroke Maternal Grandfather    Heart disease Maternal Aunt    Stroke Maternal Aunt        #1   Heart attack Maternal Aunt        #1   Hypertension Maternal Aunt        #1   Arthritis/Rheumatoid Sister    COPD Sister    Heart disease Maternal Uncle    Heart attack Maternal Uncle        x4   Heart disease Brother        #1   Heart attack Brother        #1   Diabetes Brother    Diabetes Maternal Aunt    Mental retardation Maternal Aunt    Healthy Daughter        x2   Drug abuse Daughter        #1   Drug abuse Daughter        #2   Kidney Stones Daughter        #2   Other Son        Agoraphobia    Social History:  Social History   Socioeconomic History   Marital status:  Single    Spouse name: Not on file   Number of children: Not on file   Years of education: Not on file   Highest education level: Not on file  Occupational History   Not on file  Tobacco Use   Smoking status: Every Day   Smokeless tobacco: Never  Substance and Sexual Activity   Alcohol use: Yes   Drug use: Not Currently   Sexual activity: Not Currently  Other Topics Concern   Not on file  Social  History Narrative   Right handed    Caffeine- 12 pack of mountain dew    Lives at home with 2 daughters and 3 grandkids   Social Drivers of Corporate investment banker Strain: Not on file  Food Insecurity: Not on file  Transportation Needs: Not on file  Physical Activity: Not on file  Stress: Not on file  Social Connections: Not on file    Allergies:  Allergies  Allergen Reactions   Penicillins Other (See Comments)    Childhood allergy- Reaction?? Has patient had a PCN reaction causing immediate rash, facial/tongue/throat swelling, SOB or lightheadedness with hypotension: Unknown Has patient had a PCN reaction causing severe rash involving mucus membranes or skin necrosis: Unknown Has patient had a PCN reaction that required hospitalization: Unknown Has patient had a PCN reaction occurring within the last 10 years: Unknown If all of the above answers are "NO", then may proceed with Cephalosporin use.      Metabolic Disorder Labs: Lab Results  Component Value Date   HGBA1C 5.3 07/13/2021   MPG 145.59 05/06/2018   No results found for: "PROLACTIN" Lab Results  Component Value Date   CHOL 203 (H) 05/06/2018   TRIG 73 05/06/2018   HDL 51 05/06/2018   CHOLHDL 4.0 05/06/2018   VLDL 15 05/06/2018   LDLCALC 137 (H) 05/06/2018   Lab Results  Component Value Date   TSH 3.501 02/27/2021   TSH 3.388 10/23/2019    Therapeutic Level Labs: No results found for: "LITHIUM" Lab Results  Component Value Date   VALPROATE 56 03/08/2021   VALPROATE 132 (H) 03/06/2021   No  results found for: "CBMZ"  Current Medications: Current Outpatient Medications  Medication Sig Dispense Refill   haloperidol (HALDOL) 1 MG tablet Take 1 tablet (1 mg total) by mouth at bedtime. 30 tablet 3   albuterol (VENTOLIN HFA) 108 (90 Base) MCG/ACT inhaler INHALE 1 PUFF INTO THE LUNGS EVERY 4 HOURS AS NEEDED FOR WHEEZING OR SHORTNESS OF BREATH 8.5 g 1   ANORO ELLIPTA 62.5-25 MCG/ACT AEPB INHALE 1 PUFF INTO THE LUNGS DAILY 60 each 1   benztropine (COGENTIN) 1 MG tablet Take 1 mg twice daily 60 tablet 3   doxepin (SINEQUAN) 150 MG capsule Take 1 capsule (150 mg total) by mouth at bedtime. 30 capsule 3   fluticasone (FLONASE) 50 MCG/ACT nasal spray Place 2 sprays into both nostrils daily.     gabapentin (NEURONTIN) 400 MG capsule Take 1 capsule (400 mg total) by mouth 3 (three) times daily. 90 capsule 3   ketoconazole (NIZORAL) 2 % shampoo APPLY TOPICALLY TO THE SCALP 1 TIME A WEEK 120 mL 0   ketoconazole (NIZORAL) 2 % shampoo Apply 1 application to the scalp once a week. 120 mL 0   losartan (COZAAR) 25 MG tablet TAKE 1 TABLET(25 MG) BY MOUTH DAILY FOR 90 DOSES 90 tablet 0   losartan (COZAAR) 25 MG tablet Take 1 tablet (25 mg total) by mouth daily for 90 doses. 90 tablet 0   metoprolol succinate (TOPROL-XL) 25 MG 24 hr tablet Take 1 tablet (25 mg total) by mouth daily. 90 tablet 0   nicotine (NICODERM CQ - DOSED IN MG/24 HOURS) 21 mg/24hr patch Place 1 patch (21 mg total) onto the skin daily. 28 patch 3   OLANZapine (ZYPREXA) 20 MG tablet Take 1 tablet (20 mg total) by mouth at bedtime. 30 tablet 3   umeclidinium-vilanterol (ANORO ELLIPTA) 62.5-25 MCG/ACT AEPB Inhale 1 puff into the lungs daily. 60  each 1   No current facility-administered medications for this visit.     Musculoskeletal: Strength & Muscle Tone: within normal limits and telehealth visit Gait & Station: normal, telehealth visit Patient leans: N/A  Psychiatric Specialty Exam: Review of Systems  There were no vitals  taken for this visit.There is no height or weight on file to calculate BMI.  General Appearance: Well Groomed  Eye Contact:  Good  Speech:  Clear and Coherent and Normal Rate  Volume:  Normal  Mood:  Anxious and Depressed  Affect:  Appropriate and Congruent  Thought Process:  Coherent, Goal Directed and Linear  Orientation:  Full (Time, Place, and Person)  Thought Content: Hallucinations: Auditory Tactile, Ideas of Reference:   Paranoia Delusions, and Paranoid Ideation   Suicidal Thoughts:  No  Homicidal Thoughts:  No  Memory:  Immediate;   Good Recent;   Good Remote;   Good  Judgement:  Good  Insight:  Fair  Psychomotor Activity:  Normal  Concentration:  Concentration: Good and Attention Span: Good  Recall:  Good  Fund of Knowledge: Good  Language: Good  Akathisia:  No  Handed:  Right  AIMS (if indicated): not done  Assets:  Communication Skills Desire for Improvement Financial Resources/Insurance Housing Leisure Time Social Support  ADL's:  Intact  Cognition: WNL  Sleep:  Good   Screenings: AIMS    Flowsheet Row Clinical Support from 03/24/2023 in [facility] Admission (Discharged) [facility]) [facility]  AIMS Total Score 0 0 0      AUDIT    Flowsheet Row Admission (Discharged) from [facility] ([facility]  Alcohol Use Disorder Identification Test Final Score (AUDIT) 5 4      GAD-7    Flowsheet Row Video Visit from 08/11/2023 in [facility] Video Visit from 06/10/2023 in [facility] Clinical Support from 03/24/2023 in [facility] Video Visit from 01/02/2023 in [facility] Video Visit from 10/14/2022 in [facility]  Total GAD-7 Score 20 16 19 21 16       PHQ2-9    Flowsheet Row Video Visit from 08/11/2023 in [facility] Video Visit from 06/10/2023 in [facility] Clinical Support from 03/24/2023 in [facility] Video Visit from 01/02/2023 in [facility] Video Visit from 10/14/2022 in [facility]  PHQ-2 Total Score 5 6 3 5 3   PHQ-9 Total Score 22 19 17 22 17       Flowsheet Row Video Visit from 08/11/2023 in [facility] ED from 03/24/2023 in [facility] Department at [facility] Video Visit from 04/09/2021 in [facility]  C-SSRS RISK CATEGORY Error: Q7 should not be populated when Q6 is No No Risk Error: Q7 should not be populated when Q6 is No        Assessment and Plan: Patient notes that she continues to be anxious and depressed. She also continues to experience AH, tactile hallucinations, and paranoia. Patient has a history of metabolic encephalopathy which may be contributing to her delusional thinking and psychosis.  Patient was on Geodon in the past but discontinued  it.  She is uncertain why she discontinued it.  Provider recommended starting a low-dose of Haldol as adjunct with her current medication.  She was agreeable to starting Haldol 1 mg nightly to help manage symptoms of psychosis and mood.  Provider attempted to call patient's daughter with her permission to notify them of the medication changes however got the voicemail.  Provider did leave a voicemail requesting that they call to have questions or concerns.  At this time she does not wish to continue hydroxyzine.  Patient will continue other medications as prescribe  1. Schizoaffective disorder, bipolar type (HCC)  Start- haloperidol  (HALDOL) 1 MG tablet; Take 1 tablet (1 mg total) by mouth at bedtime.  Dispense: 30 tablet; Refill: 3 Continue- benztropine (COGENTIN) 1 MG tablet; Take 1 mg twice daily  Dispense: 60 tablet; Refill: 3 Continue- OLANZapine (ZYPREXA) 20 MG tablet; Take 1 tablet (20 mg total) by mouth at bedtime.  Dispense: 30 tablet; Refill: 3  2. GAD (generalized anxiety disorder)  Continue- doxepin (SINEQUAN) 150 MG capsule; Take 1 capsule (150 mg total) by mouth at bedtime.  Dispense: 30 capsule; Refill: 3 Continue- gabapentin (NEURONTIN) 400 MG capsule; Take 1 capsule (400 mg total) by mouth 3 (three) times daily.  Dispense: 90 capsule; Refill: 3  3. Insomnia due to other mental disorder  Continue- doxepin (SINEQUAN) 150 MG capsule; Take 1 capsule (150 mg total) by mouth at bedtime.  Dispense: 30 capsule; Refill: 3  4. Tobacco use disorder  Continue- nicotine (NICODERM CQ - DOSED IN MG/24 HOURS) 21 mg/24hr patch; Place 1 patch (21 mg total) onto the skin daily.  Dispense: 28 patch; Refill: 3     Follow-up in 2.5 months Follow-up with      Shanna Cisco, NP 08/11/2023, 2:44 PM

## 2023-08-19 ENCOUNTER — Other Ambulatory Visit (HOSPITAL_COMMUNITY): Payer: Self-pay | Admitting: Psychiatry

## 2023-08-19 DIAGNOSIS — F250 Schizoaffective disorder, bipolar type: Secondary | ICD-10-CM

## 2023-08-28 ENCOUNTER — Other Ambulatory Visit: Payer: Self-pay | Admitting: Family

## 2023-08-28 DIAGNOSIS — J449 Chronic obstructive pulmonary disease, unspecified: Secondary | ICD-10-CM

## 2023-09-25 ENCOUNTER — Other Ambulatory Visit: Payer: Self-pay | Admitting: Family

## 2023-09-25 ENCOUNTER — Other Ambulatory Visit: Payer: Self-pay

## 2023-09-25 DIAGNOSIS — J449 Chronic obstructive pulmonary disease, unspecified: Secondary | ICD-10-CM

## 2023-09-25 MED ORDER — ANORO ELLIPTA 62.5-25 MCG/ACT IN AEPB
1.0000 | INHALATION_SPRAY | Freq: Every day | RESPIRATORY_TRACT | 1 refills | Status: AC
Start: 2023-09-25 — End: ?

## 2023-10-02 ENCOUNTER — Other Ambulatory Visit: Payer: Self-pay

## 2023-10-02 DIAGNOSIS — I10 Essential (primary) hypertension: Secondary | ICD-10-CM

## 2023-10-02 MED ORDER — METOPROLOL SUCCINATE ER 25 MG PO TB24
25.0000 mg | ORAL_TABLET | Freq: Every day | ORAL | 0 refills | Status: AC
Start: 2023-10-02 — End: 2023-12-31

## 2023-10-16 ENCOUNTER — Telehealth (HOSPITAL_COMMUNITY): Payer: MEDICAID | Admitting: Psychiatry

## 2023-10-16 ENCOUNTER — Encounter (HOSPITAL_COMMUNITY): Payer: Self-pay

## 2023-11-20 ENCOUNTER — Other Ambulatory Visit: Payer: Self-pay | Admitting: Family

## 2023-11-20 ENCOUNTER — Other Ambulatory Visit: Payer: Self-pay

## 2023-11-20 DIAGNOSIS — I10 Essential (primary) hypertension: Secondary | ICD-10-CM

## 2023-11-20 MED ORDER — LOSARTAN POTASSIUM 25 MG PO TABS
25.0000 mg | ORAL_TABLET | Freq: Every day | ORAL | 0 refills | Status: AC
Start: 2023-11-20 — End: ?

## 2023-11-28 ENCOUNTER — Other Ambulatory Visit: Payer: Self-pay | Admitting: Family

## 2023-11-28 DIAGNOSIS — I10 Essential (primary) hypertension: Secondary | ICD-10-CM

## 2023-11-28 NOTE — Telephone Encounter (Signed)
 Requested Prescriptions  Refused Prescriptions Disp Refills   metoprolol succinate (TOPROL-XL) 25 MG 24 hr tablet [Pharmacy Med Name: METOPROLOL ER SUCCINATE 25MG  TABS] 90 tablet 0    Sig: TAKE 1 TABLET(25 MG) BY MOUTH DAILY     Cardiovascular:  Beta Blockers Failed - 11/28/2023  5:40 PM      Failed - Valid encounter within last 6 months    Recent Outpatient Visits           6 months ago Primary hypertension   [facility], [place], [place]   2 years ago Essential hypertension   [place] [facility], [place], NP   2 years ago Essential hypertension   [facility] [place] Drucilla Chalet, RPH-CPP   2 years ago Essential hypertension    [facility] at [facility], Gildardo Pounds, NP   2 years ago Essential hypertension    [facility] at [facility], [place] J, NP              Passed - Last BP in normal range    BP Readings from Last 1 Encounters:  05/05/23 129/87         Passed - Last Heart Rate in normal range    Pulse Readings from Last 1 Encounters:  05/05/23 61

## 2023-12-29 ENCOUNTER — Other Ambulatory Visit: Payer: Self-pay | Admitting: Family

## 2023-12-29 DIAGNOSIS — J449 Chronic obstructive pulmonary disease, unspecified: Secondary | ICD-10-CM

## 2023-12-29 DIAGNOSIS — F5105 Insomnia due to other mental disorder: Secondary | ICD-10-CM

## 2023-12-29 DIAGNOSIS — F250 Schizoaffective disorder, bipolar type: Secondary | ICD-10-CM

## 2023-12-29 DIAGNOSIS — F411 Generalized anxiety disorder: Secondary | ICD-10-CM

## 2023-12-29 DIAGNOSIS — I10 Essential (primary) hypertension: Secondary | ICD-10-CM

## 2023-12-29 NOTE — Telephone Encounter (Signed)
 Copied from CRM [id number]. Topic: Clinical - Medication Refill >> Dec 29, 2023 10:18 AM Artemio Larry wrote: Most Recent Primary Care Visit:  Provider: Lavona Pounds J  Department: [facility]  Visit Type: OFFICE VISIT  Date: 05/05/2023  Medication: doxepin  OLANZapine   metoprolol  succinate (TOPROL -XL) 25 MG 24 hr tablet losartan   ANORO ELLIPTA   Has the patient contacted their pharmacy? Yes (Agent: If no, request that the patient contact the pharmacy for the refill. If patient does not wish to contact the pharmacy document the reason why and proceed with request.) (Agent: If yes, when and what did the pharmacy advise?)  Is this the correct pharmacy for this prescription? Yes If no, delete pharmacy and type the correct one.  This is the patient's preferred pharmacy:   [id number] - [place], [place] - [address] AT [address] [place] [address] Phone: [phone] Fax: [phone]   Has the prescription been filled recently? Yes  Is the patient out of the medication? Yes, out of two medications   Has the patient been seen for an appointment in the last year OR does the patient have an upcoming appointment? Yes  Can we respond through MyChart? No  Agent: Please be advised that Rx refills may take up to 3 business days. We ask that you follow-up with your pharmacy.

## 2023-12-30 ENCOUNTER — Other Ambulatory Visit (HOSPITAL_COMMUNITY): Payer: Self-pay | Admitting: Psychiatry

## 2023-12-30 DIAGNOSIS — F250 Schizoaffective disorder, bipolar type: Secondary | ICD-10-CM

## 2023-12-30 DIAGNOSIS — F411 Generalized anxiety disorder: Secondary | ICD-10-CM

## 2023-12-30 DIAGNOSIS — F5105 Insomnia due to other mental disorder: Secondary | ICD-10-CM

## 2023-12-31 MED ORDER — ANORO ELLIPTA 62.5-25 MCG/ACT IN AEPB
1.0000 | INHALATION_SPRAY | Freq: Every day | RESPIRATORY_TRACT | 1 refills | Status: AC
Start: 2023-12-31 — End: ?

## 2023-12-31 NOTE — Telephone Encounter (Signed)
 Requested medication (s) are due for refill today - yes  Requested medication (s) are on the active medication list -yes  Future visit scheduled -no  Last refill: Doxepin - 08/11/23 #30 3RF- fails visit protocol                  Losartan -11/20/23 #90- too soon                  Olanzapine - 08/11/23 #30 3RF- non delegated Rx, fails visit protocol                  Metoprolol - 10/02/23 #90- fails visit protocol  Notes to clinic: Patient states she does not have transportation to office- can she do virtual or get transportation assistance?  Requested Prescriptions  Pending Prescriptions Disp Refills   doxepin  (SINEQUAN ) 150 MG capsule 30 capsule 3    Sig: Take 1 capsule (150 mg total) by mouth at bedtime.     Psychiatry:  Antidepressants - Heterocyclics (TCAs) Failed - 12/31/2023  8:32 AM      Failed - Valid encounter within last 6 months    Recent Outpatient Visits           8 months ago Primary hypertension   [place] [facility], [place]   2 years ago Essential hypertension   [place] [facility] at [facility], [place], [place]   2 years ago Essential hypertension   [facility] [facility] [place] [facility] Dept Of [place]. [facility] Valente Gaskin, RPH-CPP   2 years ago Essential hypertension   [place] [facility] at [facility], Baldomero Bone, NP   2 years ago Essential hypertension   [place] [facility] at [facility], [place], [place]              Passed - Completed PHQ-2 or PHQ-9 in the last 360 days     Psychiatry:  Antidepressants - Heterocyclics (TCAs) - doxepin  Passed - 12/31/2023  8:32 AM      Passed - Valid encounter within last 12 months    Recent Outpatient Visits           8 months ago Primary hypertension   [place] [facility], [place], [place]   2 years ago Essential hypertension   [place] [facility] at [facility], [place], [place]   2 years ago  Essential hypertension   [place] [place] [facility] [place] - [facility] Dept Of [place]. [facility] Valente Gaskin, RPH-CPP   2 years ago Essential hypertension   [place] [facility] at [facility], Baldomero Bone, NP   2 years ago Essential hypertension    Primary Care at [facility], [place] J, NP               losartan  (COZAAR ) 25 MG tablet 90 tablet 0     Cardiovascular:  Angiotensin Receptor Blockers Failed - 12/31/2023  8:32 AM      Failed - Cr in normal range and within 180 days    Creatinine, Ser  Date Value Ref Range Status  05/05/2023 0.94 0.57 - 1.00 mg/dL Final   Creatinine, Urine  Date Value Ref Range Status  03/05/2021 101.49 mg/dL Final    Comment:    Performed at [facility], [address], [place], [place] [address]  Failed - K in normal range and within 180 days    Potassium  Date Value Ref Range Status  05/05/2023 4.0 3.5 - 5.2 mmol/L Final         Failed - Valid encounter within last 6 months    Recent Outpatient Visits           8 months ago Primary hypertension   [place] [facility], [place], [place]   2 years ago Essential hypertension   [place] [facility] at [facility], [place], [place]   2 years ago Essential hypertension   [place] [facility] [place] - [facility] Dept Of [place]. [facility] Valente Gaskin, RPH-CPP   2 years ago Essential hypertension   [place] [facility] at [facility], Baldomero Bone, NP   2 years ago Essential hypertension   [place] [facility] at [facility], [place], [place]              Passed - Patient is not pregnant      Passed - Last BP in normal range    BP Readings from Last 1 Encounters:  05/05/23 129/87          losartan  (COZAAR ) 25 MG tablet 90 tablet 0    Sig: Take 1 tablet (25 mg total) by mouth daily.     Cardiovascular:  Angiotensin Receptor Blockers Failed -  12/31/2023  8:32 AM      Failed - Cr in normal range and within 180 days    Creatinine, Ser  Date Value Ref Range Status  05/05/2023 0.94 0.57 - 1.00 mg/dL Final   Creatinine, Urine  Date Value Ref Range Status  03/05/2021 101.49 mg/dL Final    Comment:    Performed at [facility], [address], [place], [place] [address]         Failed - K in normal range and within 180 days    Potassium  Date Value Ref Range Status  05/05/2023 4.0 3.5 - 5.2 mmol/L Final         Failed - Valid encounter within last 6 months    Recent Outpatient Visits           8 months ago Primary hypertension   [place] [facility], [place]   2 years ago Essential hypertension   [place] [facility], [place], [place]   2 years ago Essential hypertension   [place] [facility] [place] - [facility] Dept Of [place]. [facility] Valente Gaskin, RPH-CPP   2 years ago Essential hypertension    [facility] at [facility], Baldomero Bone, NP   2 years ago Essential hypertension    [facility] at [facility], [place], [place]              Passed - Patient is not pregnant      Passed - Last BP in normal range    BP Readings from Last 1 Encounters:  05/05/23 129/87          OLANZapine  (ZYPREXA ) 20 MG tablet 30 tablet 3    Sig: Take 1 tablet (20 mg total) by mouth at bedtime.     Not Delegated - Psychiatry:  Antipsychotics - Second Generation (Atypical) - olanzapine  Failed - 12/31/2023  8:32 AM      Failed -  This refill cannot be delegated      Failed - TSH in normal range and within 360 days    TSH  Date Value Ref Range Status  02/27/2021 3.501 0.350 - 4.500 uIU/mL Final    Comment:    Performed by a 3rd Generation assay with a functional sensitivity of <=0.01 uIU/mL. Performed at [facility], [address] [place], [place] [address]          Failed -  Valid encounter within last 6 months    Recent Outpatient Visits           8 months ago Primary hypertension   [place] [facility], [place], [place]   2 years ago Essential hypertension   [place] [facility] at [facility], [place], [place]   2 years ago Essential hypertension   [place] [facility] [place] - [facility] Dept Of [place]. [facility] Valente Gaskin, RPH-CPP   2 years ago Essential hypertension   [place] [facility] at [facility], Baldomero Bone, NP   2 years ago Essential hypertension   [place] [facility] at [facility], [place] J, NP              Failed - Lipid Panel in normal range within the last 12 months    Cholesterol  Date Value Ref Range Status  05/06/2018 203 (H) 0 - 200 mg/dL Final   LDL Cholesterol  Date Value Ref Range Status  05/06/2018 137 (H) 0 - 99 mg/dL Final    Comment:           Total Cholesterol/HDL:CHD Risk Coronary Heart Disease Risk Table                     Men   Women  1/2 Average Risk   3.4   3.3  Average Risk       5.0   4.4  2 X Average Risk   9.6   7.1  3 X Average Risk  23.4   11.0        Use the calculated Patient Ratio above and the CHD Risk Table to determine the patient's CHD Risk.        ATP III CLASSIFICATION (LDL):  <100     mg/dL   Optimal  604-540  mg/dL   Near or Above                    Optimal  130-159  mg/dL   Borderline  981-191  mg/dL   High  >478     mg/dL   Very High Performed at [facility], [address] [place], [place] [address]    HDL  Date Value Ref Range Status  05/06/2018 51 >40 mg/dL Final   Triglycerides  Date Value Ref Range Status  05/06/2018 73 <150 mg/dL Final         Failed - CMP within normal limits and completed in the last 12 months    Albumin  Date Value Ref Range Status  03/11/2021 2.9 (L) 3.5 - 5.0 g/dL Final   Alkaline Phosphatase  Date Value Ref Range Status  03/11/2021 53  38 - 126 U/L Final   ALT  Date Value Ref Range Status  03/11/2021 18 0 - 44 U/L Final   AST  Date Value Ref Range Status  03/11/2021 16 15 - 41 U/L Final  BUN  Date Value Ref Range Status  05/05/2023 11 8 - 27 mg/dL Final   Calcium   Date Value Ref Range Status  05/05/2023 8.5 (L) 8.7 - 10.3 mg/dL Final   CO2  Date Value Ref Range Status  05/05/2023 21 20 - 29 mmol/L Final   Creatinine, Ser  Date Value Ref Range Status  05/05/2023 0.94 0.57 - 1.00 mg/dL Final   Creatinine, Urine  Date Value Ref Range Status  03/05/2021 101.49 mg/dL Final    Comment:    Performed at [facility], [address], [place], [place] [address]   Glucose  Date Value Ref Range Status  05/05/2023 78 70 - 99 mg/dL Final   Glucose, Bld  Date Value Ref Range Status  03/24/2023 85 70 - 99 mg/dL Final    Comment:    Glucose reference range applies only to samples taken after fasting for at least 8 hours.   Glucose-Capillary  Date Value Ref Range Status  03/13/2021 103 (H) 70 - 99 mg/dL Final    Comment:    Glucose reference range applies only to samples taken after fasting for at least 8 hours.   Potassium  Date Value Ref Range Status  05/05/2023 4.0 3.5 - 5.2 mmol/L Final   Sodium  Date Value Ref Range Status  05/05/2023 143 134 - 144 mmol/L Final   Total Bilirubin  Date Value Ref Range Status  03/11/2021 0.5 0.3 - 1.2 mg/dL Final   Protein, ur  Date Value Ref Range Status  03/24/2023 NEGATIVE NEGATIVE mg/dL Final   Total Protein  Date Value Ref Range Status  03/11/2021 6.0 (L) 6.5 - 8.1 g/dL Final   GFR calc Af Amer  Date Value Ref Range Status  04/04/2020 >60 >60 mL/min Final   eGFR  Date Value Ref Range Status  05/05/2023 69 >59 mL/min/1.73 Final   GFR, Estimated  Date Value Ref Range Status  03/24/2023 >60 >60 mL/min Final    Comment:    (NOTE) Calculated using the CKD-EPI Creatinine Equation (2021)          Passed - Completed PHQ-2 or  PHQ-9 in the last 360 days      Passed - Last BP in normal range    BP Readings from Last 1 Encounters:  05/05/23 129/87         Passed - Last Heart Rate in normal range    Pulse Readings from Last 1 Encounters:  05/05/23 61         Passed - CBC within normal limits and completed in the last 12 months    WBC  Date Value Ref Range Status  03/24/2023 6.9 4.0 - 10.5 K/uL Final   RBC  Date Value Ref Range Status  03/24/2023 4.65 3.87 - 5.11 MIL/uL Final   Hemoglobin  Date Value Ref Range Status  03/24/2023 13.8 12.0 - 15.0 g/dL Final  62/13/0865 78.4 11.1 - 15.9 g/dL Final   HCT  Date Value Ref Range Status  03/24/2023 42.2 36.0 - 46.0 % Final   Hematocrit  Date Value Ref Range Status  03/27/2021 37.5 34.0 - 46.6 % Final   MCHC  Date Value Ref Range Status  03/24/2023 32.7 30.0 - 36.0 g/dL Final   [facility]  Date Value Ref Range Status  03/24/2023 29.7 26.0 - 34.0 pg Final   MCV  Date Value Ref Range Status  03/24/2023 90.8 80.0 - 100.0 fL Final  03/27/2021 91 79 - 97 fL Final  No results found for: "PLTCOUNTKUC", "LABPLAT", "POCPLA" RDW  Date Value Ref Range Status  03/24/2023 13.0 11.5 - 15.5 % Final  03/27/2021 15.2 11.7 - 15.4 % Final          metoprolol  succinate (TOPROL -XL) 25 MG 24 hr tablet 90 tablet 0    Sig: Take 1 tablet (25 mg total) by mouth daily.     Cardiovascular:  Beta Blockers Failed - 12/31/2023  8:32 AM      Failed - Valid encounter within last 6 months    Recent Outpatient Visits           8 months ago Primary hypertension   [place] [facility], [place]   2 years ago Essential hypertension   [place] [facility] at [facility], [place], [place]   2 years ago Essential hypertension   [place] [facility] [place] - [facility] Dept Of [place]. [facility] Valente Gaskin, RPH-CPP   2 years ago Essential hypertension   [place] [facility], Baldomero Bone, NP    2 years ago Essential hypertension   [place] [facility], [place] J, NP              Passed - Last BP in normal range    BP Readings from Last 1 Encounters:  05/05/23 129/87         Passed - Last Heart Rate in normal range    Pulse Readings from Last 1 Encounters:  05/05/23 61         Signed Prescriptions Disp Refills   umeclidinium-vilanterol (ANORO ELLIPTA ) 62.5-25 MCG/ACT AEPB 60 each 1    Sig: Inhale 1 puff into the lungs daily.     Pulmonology:  Combination Products Passed - 12/31/2023  8:32 AM      Passed - Valid encounter within last 12 months    Recent Outpatient Visits           8 months ago Primary hypertension   [place] [facility], [place], [place]   2 years ago Essential hypertension   [place] [facility], [place], [place]   2 years ago Essential hypertension   [place] [facility] [place] - [facility] Dept Of [place]. [facility] Valente Gaskin, RPH-CPP   2 years ago Essential hypertension   [place] [facility] at [facility], Baldomero Bone, NP   2 years ago Essential hypertension   [place] [facility] at [facility], [place], [place]                 Requested Prescriptions  Pending Prescriptions Disp Refills   doxepin  (SINEQUAN ) 150 MG capsule 30 capsule 3    Sig: Take 1 capsule (150 mg total) by mouth at bedtime.     Psychiatry:  Antidepressants - Heterocyclics (TCAs) Failed - 12/31/2023  8:32 AM      Failed - Valid encounter within last 6 months    Recent Outpatient Visits           8 months ago Primary hypertension   [place] [facility], [place], [place]   2 years ago Essential hypertension   [place] [facility] at [facility], [place], [place]   2 years ago Essential hypertension   [place] [facility] [place] -  A Dept Of [place]. [facility] Valente Gaskin, RPH-CPP   2  years ago Essential hypertension   [place] [facility] at [facility], Baldomero Bone, NP   2 years ago Essential hypertension   [place] [facility] at [facility], [place], [place]              Passed - Completed PHQ-2 or PHQ-9 in the last 360 days     Psychiatry:  Antidepressants - Heterocyclics (TCAs) - doxepin  Passed - 12/31/2023  8:32 AM      Passed - Valid encounter within last 12 months    Recent Outpatient Visits           8 months ago Primary hypertension   [place] [facility], [place], [place]   2 years ago Essential hypertension   [place] [facility] at [facility], [place], [place]   2 years ago Essential hypertension   [place] [facility] [place] - [facility] Dept Of [place]. [facility] Valente Gaskin, RPH-CPP   2 years ago Essential hypertension   [place] [facility], Baldomero Bone, NP   2 years ago Essential hypertension   [place] [facility] at [facility], [place] J, NP               losartan  (COZAAR ) 25 MG tablet 90 tablet 0     Cardiovascular:  Angiotensin Receptor Blockers Failed - 12/31/2023  8:32 AM      Failed - Cr in normal range and within 180 days    Creatinine, Ser  Date Value Ref Range Status  05/05/2023 0.94 0.57 - 1.00 mg/dL Final   Creatinine, Urine  Date Value Ref Range Status  03/05/2021 101.49 mg/dL Final    Comment:    Performed at [facility], [address], [place], [place] [address]         Failed - K in normal range and within 180 days    Potassium  Date Value Ref Range Status  05/05/2023 4.0 3.5 - 5.2 mmol/L Final         Failed - Valid encounter within last 6 months    Recent Outpatient Visits           8 months ago Primary hypertension   [place] [facility], [place]   2 years ago Essential hypertension   [place] [facility] at [facility],  [place], [place]   2 years ago Essential hypertension   [place] [facility] [place] - [facility] Dept Of [place]. [facility] Valente Gaskin, RPH-CPP   2 years ago Essential hypertension   [place] [facility] at [facility], Baldomero Bone, NP   2 years ago Essential hypertension    [facility] at [facility], [place], [place]              Passed - Patient is not pregnant      Passed - Last BP in normal range    BP Readings from Last 1 Encounters:  05/05/23 129/87          losartan  (COZAAR ) 25 MG tablet 90 tablet 0    Sig: Take 1 tablet (25 mg total) by mouth daily.     Cardiovascular:  Angiotensin Receptor Blockers Failed - 12/31/2023  8:32 AM  Failed - Cr in normal range and within 180 days    Creatinine, Ser  Date Value Ref Range Status  05/05/2023 0.94 0.57 - 1.00 mg/dL Final   Creatinine, Urine  Date Value Ref Range Status  03/05/2021 101.49 mg/dL Final    Comment:    Performed at [facility], [address], [place], [place] [address]         Failed - K in normal range and within 180 days    Potassium  Date Value Ref Range Status  05/05/2023 4.0 3.5 - 5.2 mmol/L Final         Failed - Valid encounter within last 6 months    Recent Outpatient Visits           8 months ago Primary hypertension   [place] [facility], [place], [place]   2 years ago Essential hypertension   [place] [facility] at [facility], [place], [place]   2 years ago Essential hypertension   [place] [facility] [place] - [facility] Dept Of . [facility] Valente Gaskin, RPH-CPP   2 years ago Essential hypertension   [place] [facility] at [facility], Baldomero Bone, NP   2 years ago Essential hypertension   [place] [facility] at [facility], [place], [place]              Passed - Patient is not pregnant      Passed - Last BP in normal range    BP  Readings from Last 1 Encounters:  05/05/23 129/87          OLANZapine  (ZYPREXA ) 20 MG tablet 30 tablet 3    Sig: Take 1 tablet (20 mg total) by mouth at bedtime.     Not Delegated - Psychiatry:  Antipsychotics - Second Generation (Atypical) - olanzapine  Failed - 12/31/2023  8:32 AM      Failed - This refill cannot be delegated      Failed - TSH in normal range and within 360 days    TSH  Date Value Ref Range Status  02/27/2021 3.501 0.350 - 4.500 uIU/mL Final    Comment:    Performed by a 3rd Generation assay with a functional sensitivity of <=0.01 uIU/mL. Performed at [facility], [address] [place], [place] [address]          Failed - Valid encounter within last 6 months    Recent Outpatient Visits           8 months ago Primary hypertension   [place] [facility], [place]   2 years ago Essential hypertension   [place] [facility] at [facility], [place], [place]   2 years ago Essential hypertension   [place] [facility] [place] - [facility] Dept Of . [facility] Valente Gaskin, RPH-CPP   2 years ago Essential hypertension   [place] [facility], Baldomero Bone, NP   2 years ago Essential hypertension   [place] [facility] at [facility], [place] J, NP              Failed - Lipid Panel in normal range within the last 12 months    Cholesterol  Date Value Ref Range Status  05/06/2018 203 (H) 0 - 200 mg/dL Final   LDL Cholesterol  Date Value Ref Range Status  05/06/2018 137 (H) 0 - 99 mg/dL Final    Comment:           Total Cholesterol/HDL:CHD Risk Coronary Heart Disease Risk Table                     Men   Women  1/2 Average Risk   3.4   3.3  Average Risk       5.0   4.4  2 X Average Risk   9.6   7.1  3 X Average Risk  23.4   11.0        Use the calculated Patient Ratio above and the CHD Risk Table to determine the patient's CHD  Risk.        ATP III CLASSIFICATION (LDL):  <100     mg/dL   Optimal  161-096  mg/dL   Near or Above                    Optimal  130-159  mg/dL   Borderline  045-409  mg/dL   High  >811     mg/dL   Very High Performed at [facility], [address] [place], [place] [address]    HDL  Date Value Ref Range Status  05/06/2018 51 >40 mg/dL Final   Triglycerides  Date Value Ref Range Status  05/06/2018 73 <150 mg/dL Final         Failed - CMP within normal limits and completed in the last 12 months    Albumin  Date Value Ref Range Status  03/11/2021 2.9 (L) 3.5 - 5.0 g/dL Final   Alkaline Phosphatase  Date Value Ref Range Status  03/11/2021 53 38 - 126 U/L Final   ALT  Date Value Ref Range Status  03/11/2021 18 0 - 44 U/L Final   AST  Date Value Ref Range Status  03/11/2021 16 15 - 41 U/L Final   BUN  Date Value Ref Range Status  05/05/2023 11 8 - 27 mg/dL Final   Calcium   Date Value Ref Range Status  05/05/2023 8.5 (L) 8.7 - 10.3 mg/dL Final   CO2  Date Value Ref Range Status  05/05/2023 21 20 - 29 mmol/L Final   Creatinine, Ser  Date Value Ref Range Status  05/05/2023 0.94 0.57 - 1.00 mg/dL Final   Creatinine, Urine  Date Value Ref Range Status  03/05/2021 101.49 mg/dL Final    Comment:    Performed at [facility], [address] [place], [place] [address]   Glucose  Date Value Ref Range Status  05/05/2023 78 70 - 99 mg/dL Final   Glucose, Bld  Date Value Ref Range Status  03/24/2023 85 70 - 99 mg/dL Final    Comment:    Glucose reference range applies only to samples taken after fasting for at least 8 hours.   Glucose-Capillary  Date Value Ref Range Status  03/13/2021 103 (H) 70 - 99 mg/dL Final    Comment:    Glucose reference range applies only to samples taken after fasting for at least 8 hours.   Potassium  Date Value Ref Range Status  05/05/2023 4.0 3.5 - 5.2 mmol/L Final   Sodium  Date Value  Ref Range Status  05/05/2023 143 134 - 144 mmol/L Final   Total Bilirubin  Date Value Ref Range Status  03/11/2021 0.5 0.3 - 1.2 mg/dL Final   Protein, ur  Date Value Ref Range Status  03/24/2023 NEGATIVE NEGATIVE mg/dL Final   Total Protein  Date Value Ref Range Status  03/11/2021 6.0 (L) 6.5 - 8.1 g/dL Final   GFR calc Af Amer  Date Value Ref Range Status  04/04/2020 >60 >60 mL/min Final   eGFR  Date Value Ref Range Status  05/05/2023 69 >59 mL/min/1.73 Final   GFR, Estimated  Date Value Ref Range Status  03/24/2023 >60 >60 mL/min Final    Comment:    (NOTE) Calculated using the CKD-EPI Creatinine Equation (2021)          Passed - Completed PHQ-2 or PHQ-9 in the last 360 days      Passed - Last BP in normal range    BP Readings from Last 1 Encounters:  05/05/23 129/87         Passed - Last Heart Rate in normal range    Pulse Readings from Last 1 Encounters:  05/05/23 61         Passed - CBC within normal limits and completed in the last 12 months    WBC  Date Value Ref Range Status  03/24/2023 6.9 4.0 - 10.5 K/uL Final   RBC  Date Value Ref Range Status  03/24/2023 4.65 3.87 - 5.11 MIL/uL Final   Hemoglobin  Date Value Ref Range Status  03/24/2023 13.8 12.0 - 15.0 g/dL Final  16/05/9603 54.0 11.1 - 15.9 g/dL Final   HCT  Date Value Ref Range Status  03/24/2023 42.2 36.0 - 46.0 % Final   Hematocrit  Date Value Ref Range Status  03/27/2021 37.5 34.0 - 46.6 % Final   MCHC  Date Value Ref Range Status  03/24/2023 32.7 30.0 - 36.0 g/dL Final   [facility]  Date Value Ref Range Status  03/24/2023 29.7 26.0 - 34.0 pg Final   MCV  Date Value Ref Range Status  03/24/2023 90.8 80.0 - 100.0 fL Final  03/27/2021 91 79 - 97 fL Final   No results found for: "PLTCOUNTKUC", "LABPLAT", "POCPLA" RDW  Date Value Ref Range Status  03/24/2023 13.0 11.5 - 15.5 % Final  03/27/2021 15.2 11.7 - 15.4 % Final          metoprolol  succinate (TOPROL -XL) 25 MG 24  hr tablet 90 tablet 0    Sig: Take 1 tablet (25 mg total) by mouth daily.     Cardiovascular:  Beta Blockers Failed - 12/31/2023  8:32 AM      Failed - Valid encounter within last 6 months    Recent Outpatient Visits           8 months ago Primary hypertension   [place] [facility], [place]   2 years ago Essential hypertension   [place] [facility] [facility], [place], NP   2 years ago Essential hypertension   [facility] Of [place]. [facility] Valente Gaskin, RPH-CPP   2 years ago Essential hypertension   [place] [facility], Baldomero Bone, NP   2 years ago Essential hypertension   [place] [facility], [place] J, NP              Passed - Last BP in normal range    BP Readings from Last 1 Encounters:  05/05/23 129/87         Passed - Last Heart Rate  in normal range    Pulse Readings from Last 1 Encounters:  05/05/23 61         Signed Prescriptions Disp Refills   umeclidinium-vilanterol (ANORO ELLIPTA ) 62.5-25 MCG/ACT AEPB 60 each 1    Sig: Inhale 1 puff into the lungs daily.     Pulmonology:  Combination Products Passed - 12/31/2023  8:32 AM      Passed - Valid encounter within last 12 months    Recent Outpatient Visits           8 months ago Primary hypertension   [place] [facility], [place], NP   2 years ago Essential hypertension   [place] [facility], [place], NP   2 years ago Essential hypertension   [facility] [place] [facility] Of [place]. [facility] Valente Gaskin, RPH-CPP   2 years ago Essential hypertension   [place] [facility], Baldomero Bone, NP   2 years ago Essential hypertension   [place] [facility], Annalee Barren, NP

## 2023-12-31 NOTE — Telephone Encounter (Signed)
 OV 05/05/23 Requested Prescriptions  Pending Prescriptions Disp Refills   umeclidinium-vilanterol (ANORO ELLIPTA ) 62.5-25 MCG/ACT AEPB 60 each 1    Sig: Inhale 1 puff into the lungs daily.     Pulmonology:  Combination Products Passed - 12/31/2023  8:32 AM      Passed - Valid encounter within last 12 months    Recent Outpatient Visits           8 months ago Primary hypertension   [place] [facility], [place], NP   2 years ago Essential hypertension   [place] [facility], [place], NP   2 years ago Essential hypertension   [place] [facility] [place] [facility] [place]. [facility] Valente Gaskin, RPH-CPP   2 years ago Essential hypertension   [place] [facility], Baldomero Bone, NP   2 years ago Essential hypertension   [place] [facility], [place] J, NP               doxepin  (SINEQUAN ) 150 MG capsule 30 capsule 3    Sig: Take 1 capsule (150 mg total) by mouth at bedtime.     Psychiatry:  Antidepressants - Heterocyclics (TCAs) Failed - 12/31/2023  8:32 AM      Failed - Valid encounter within last 6 months    Recent Outpatient Visits           8 months ago Primary hypertension   [place] [facility], [place], NP   2 years ago Essential hypertension   [place] [facility], [place], NP   2 years ago Essential hypertension   [place] [facility] [place] [facility] [place]. [facility] Valente Gaskin, RPH-CPP   2 years ago Essential hypertension   [place] [facility], Baldomero Bone, NP   2 years ago Essential hypertension   [place] [facility], [place], NP              Passed - Completed PHQ-2 or PHQ-9 in the last 360 days     Psychiatry:  Antidepressants - Heterocyclics (TCAs) - doxepin  Passed - 12/31/2023   8:32 AM      Passed - Valid encounter within last 12 months    Recent Outpatient Visits           8 months ago Primary hypertension   [place] [facility], [place], NP   2 years ago Essential hypertension   [place] [facility], [place], NP   2 years ago Essential hypertension   [place] [facility] [place] [facility] [place]. [facility] Valente Gaskin, RPH-CPP   2 years ago Essential hypertension   [place] [facility], Baldomero Bone, NP   2 years ago Essential hypertension   [place] [facility], [place] J, NP               losartan  (COZAAR ) 25 MG tablet 90 tablet 0     Cardiovascular:  Angiotensin Receptor Blockers Failed - 12/31/2023  8:32 AM      Failed - Cr in normal range and within 180 days  Creatinine, Ser  Date Value Ref Range Status  05/05/2023 0.94 0.57 - 1.00 mg/dL Final   Creatinine, Urine  Date Value Ref Range Status  03/05/2021 101.49 mg/dL Final    Comment:    Performed at [facility], [address] [place], [place] [address]         Failed - K in normal range and within 180 days    Potassium  Date Value Ref Range Status  05/05/2023 4.0 3.5 - 5.2 mmol/L Final         Failed - Valid encounter within last 6 months    Recent Outpatient Visits           8 months ago Primary hypertension   [place] [facility], [place], NP   2 years ago Essential hypertension   [place] [facility], [place], NP   2 years ago Essential hypertension   [place] [facility] [place] [facility] Of [place]. [facility] Valente Gaskin, RPH-CPP   2 years ago Essential hypertension   [place] [facility], Baldomero Bone, NP   2 years ago Essential hypertension   [place] [facility], [place],  NP              Passed - Patient is not pregnant      Passed - Last BP in normal range    BP Readings from Last 1 Encounters:  05/05/23 129/87          losartan  (COZAAR ) 25 MG tablet 90 tablet 0    Sig: Take 1 tablet (25 mg total) by mouth daily.     Cardiovascular:  Angiotensin Receptor Blockers Failed - 12/31/2023  8:32 AM      Failed - Cr in normal range and within 180 days    Creatinine, Ser  Date Value Ref Range Status  05/05/2023 0.94 0.57 - 1.00 mg/dL Final   Creatinine, Urine  Date Value Ref Range Status  03/05/2021 101.49 mg/dL Final    Comment:    Performed at [facility], [address], [place], [place] [address]         Failed - K in normal range and within 180 days    Potassium  Date Value Ref Range Status  05/05/2023 4.0 3.5 - 5.2 mmol/L Final         Failed - Valid encounter within last 6 months    Recent Outpatient Visits           8 months ago Primary hypertension   [place] [facility], [place], NP   2 years ago Essential hypertension   [place] [facility], [place], NP   2 years ago Essential hypertension   [place] [facility] [place] [facility] [place]. [facility] Valente Gaskin, RPH-CPP   2 years ago Essential hypertension   [place] [facility], Baldomero Bone, NP   2 years ago Essential hypertension   [place] [facility], [place], NP              Passed - Patient is not pregnant      Passed - Last BP in normal range    BP Readings from Last 1 Encounters:  05/05/23 129/87  OLANZapine  (ZYPREXA ) 20 MG tablet 30 tablet 3    Sig: Take 1 tablet (20 mg total) by mouth at bedtime.     Not Delegated - Psychiatry:  Antipsychotics - Second Generation (Atypical) - olanzapine  Failed - 12/31/2023  8:32 AM      Failed - This refill cannot be delegated      Failed - TSH in normal  range and within 360 days    TSH  Date Value Ref Range Status  02/27/2021 3.501 0.350 - 4.500 uIU/mL Final    Comment:    Performed by a 3rd Generation assay with a functional sensitivity of <=0.01 uIU/mL. Performed at [facility], [address] [place], [place] [address]          Failed - Valid encounter within last 6 months    Recent Outpatient Visits           8 months ago Primary hypertension   [place] [facility], [place], NP   2 years ago Essential hypertension   [place] [facility], [place], NP   2 years ago Essential hypertension   [place] [facility] [place] [facility] [place]. [facility] Valente Gaskin, RPH-CPP   2 years ago Essential hypertension   [place] [facility], Baldomero Bone, NP   2 years ago Essential hypertension   [place] [facility], [place] J, NP              Failed - Lipid Panel in normal range within the last 12 months    Cholesterol  Date Value Ref Range Status  05/06/2018 203 (H) 0 - 200 mg/dL Final   LDL Cholesterol  Date Value Ref Range Status  05/06/2018 137 (H) 0 - 99 mg/dL Final    Comment:           Total Cholesterol/HDL:CHD Risk Coronary Heart Disease Risk Table                     Men   Women  1/2 Average Risk   3.4   3.3  Average Risk       5.0   4.4  2 X Average Risk   9.6   7.1  3 X Average Risk  23.4   11.0        Use the calculated Patient Ratio above and the CHD Risk Table to determine the patient's CHD Risk.        ATP III CLASSIFICATION (LDL):  <100     mg/dL   Optimal  621-308  mg/dL   Near or Above                    Optimal  130-159  mg/dL   Borderline  657-846  mg/dL   High  >962     mg/dL   Very High Performed at [facility], [address] [place], [place] [address]    HDL  Date Value Ref Range Status  05/06/2018 51 >40 mg/dL  Final   Triglycerides  Date Value Ref Range Status  05/06/2018 73 <150 mg/dL Final         Failed - CMP within normal limits and completed in the last 12 months    Albumin  Date Value Ref Range Status  03/11/2021 2.9 (L) 3.5 - 5.0 g/dL Final  Alkaline Phosphatase  Date Value Ref Range Status  03/11/2021 53 38 - 126 U/L Final   ALT  Date Value Ref Range Status  03/11/2021 18 0 - 44 U/L Final   AST  Date Value Ref Range Status  03/11/2021 16 15 - 41 U/L Final   BUN  Date Value Ref Range Status  05/05/2023 11 8 - 27 mg/dL Final   Calcium   Date Value Ref Range Status  05/05/2023 8.5 (L) 8.7 - 10.3 mg/dL Final   CO2  Date Value Ref Range Status  05/05/2023 21 20 - 29 mmol/L Final   Creatinine, Ser  Date Value Ref Range Status  05/05/2023 0.94 0.57 - 1.00 mg/dL Final   Creatinine, Urine  Date Value Ref Range Status  03/05/2021 101.49 mg/dL Final    Comment:    Performed at [facility], [address] [place], [place] [address]   Glucose  Date Value Ref Range Status  05/05/2023 78 70 - 99 mg/dL Final   Glucose, Bld  Date Value Ref Range Status  03/24/2023 85 70 - 99 mg/dL Final    Comment:    Glucose reference range applies only to samples taken after fasting for at least 8 hours.   Glucose-Capillary  Date Value Ref Range Status  03/13/2021 103 (H) 70 - 99 mg/dL Final    Comment:    Glucose reference range applies only to samples taken after fasting for at least 8 hours.   Potassium  Date Value Ref Range Status  05/05/2023 4.0 3.5 - 5.2 mmol/L Final   Sodium  Date Value Ref Range Status  05/05/2023 143 134 - 144 mmol/L Final   Total Bilirubin  Date Value Ref Range Status  03/11/2021 0.5 0.3 - 1.2 mg/dL Final   Protein, ur  Date Value Ref Range Status  03/24/2023 NEGATIVE NEGATIVE mg/dL Final   Total Protein  Date Value Ref Range Status  03/11/2021 6.0 (L) 6.5 - 8.1 g/dL Final   GFR calc Af Amer  Date Value Ref Range  Status  04/04/2020 >60 >60 mL/min Final   eGFR  Date Value Ref Range Status  05/05/2023 69 >59 mL/min/1.73 Final   GFR, Estimated  Date Value Ref Range Status  03/24/2023 >60 >60 mL/min Final    Comment:    (NOTE) Calculated using the CKD-EPI Creatinine Equation (2021)          Passed - Completed PHQ-2 or PHQ-9 in the last 360 days      Passed - Last BP in normal range    BP Readings from Last 1 Encounters:  05/05/23 129/87         Passed - Last Heart Rate in normal range    Pulse Readings from Last 1 Encounters:  05/05/23 61         Passed - CBC within normal limits and completed in the last 12 months    WBC  Date Value Ref Range Status  03/24/2023 6.9 4.0 - 10.5 K/uL Final   RBC  Date Value Ref Range Status  03/24/2023 4.65 3.87 - 5.11 MIL/uL Final   Hemoglobin  Date Value Ref Range Status  03/24/2023 13.8 12.0 - 15.0 g/dL Final  19/14/7829 56.2 11.1 - 15.9 g/dL Final   HCT  Date Value Ref Range Status  03/24/2023 42.2 36.0 - 46.0 % Final   Hematocrit  Date Value Ref Range Status  03/27/2021 37.5 34.0 - 46.6 % Final   MCHC  Date Value Ref Range Status  03/24/2023 32.7 30.0 - 36.0 g/dL Final   [facility]  Date Value Ref Range Status  03/24/2023 29.7 26.0 - 34.0 pg Final   MCV  Date Value Ref Range Status  03/24/2023 90.8 80.0 - 100.0 fL Final  03/27/2021 91 79 - 97 fL Final   No results found for: "PLTCOUNTKUC", "LABPLAT", "POCPLA" RDW  Date Value Ref Range Status  03/24/2023 13.0 11.5 - 15.5 % Final  03/27/2021 15.2 11.7 - 15.4 % Final          metoprolol  succinate (TOPROL -XL) 25 MG 24 hr tablet 90 tablet 0    Sig: Take 1 tablet (25 mg total) by mouth daily.     Cardiovascular:  Beta Blockers Failed - 12/31/2023  8:32 AM      Failed - Valid encounter within last 6 months    Recent Outpatient Visits           8 months ago Primary hypertension   [place] [facility], [place], NP   2 years ago Essential  hypertension   [place] [facility], [place], NP   2 years ago Essential hypertension   [place] [facility] [place] [facility] Dept Of [place]. [facility] Valente Gaskin, RPH-CPP   2 years ago Essential hypertension   [place] [facility], Baldomero Bone, NP   2 years ago Essential hypertension   [place] [facility], [place], NP              Passed - Last BP in normal range    BP Readings from Last 1 Encounters:  05/05/23 129/87         Passed - Last Heart Rate in normal range    Pulse Readings from Last 1 Encounters:  05/05/23 61

## 2024-04-30 ENCOUNTER — Other Ambulatory Visit (HOSPITAL_COMMUNITY): Payer: Self-pay | Admitting: Psychiatry

## 2024-04-30 DIAGNOSIS — F250 Schizoaffective disorder, bipolar type: Secondary | ICD-10-CM

## 2024-05-10 ENCOUNTER — Other Ambulatory Visit (HOSPITAL_COMMUNITY): Payer: Self-pay | Admitting: Psychiatry

## 2024-05-10 ENCOUNTER — Other Ambulatory Visit: Payer: Self-pay | Admitting: Family

## 2024-05-10 DIAGNOSIS — F250 Schizoaffective disorder, bipolar type: Secondary | ICD-10-CM

## 2024-05-10 NOTE — Telephone Encounter (Unsigned)
 Copied from CRM [id number]. Topic: Clinical - Medication Refill >> May 10, 2024  4:09 PM Viola F wrote: Medication: haloperidol  (HALDOL ) 1 MG tablet [[id number]] OLANZapine  (ZYPREXA ) 20 MG tablet [[id number]]  Has the patient contacted their pharmacy? Yes (Agent: If no, request that the patient contact the pharmacy for the refill. If patient does not wish to contact the pharmacy document the reason why and proceed with request.) (Agent: If yes, when and what did the pharmacy advise?)  This is the patient's preferred pharmacy:  [id number] - [place], [place] - [address] AT [facility] [address] [place] [address] Phone: [phone] Fax: [phone]  Is this the correct pharmacy for this prescription? Yes If no, delete pharmacy and type the correct one.   Has the prescription been filled recently? Yes  Is the patient out of the medication? Yes, SINCE WEDNESDAY   Has the patient been seen for an appointment in the last year OR does the patient have an upcoming appointment? Yes  Can we respond through MyChart? Yes  Agent: Please be advised that Rx refills may take up to 3 business days. We ask that you follow-up with your pharmacy.

## 2024-05-12 ENCOUNTER — Telehealth (HOSPITAL_COMMUNITY): Payer: Self-pay | Admitting: *Deleted

## 2024-05-12 NOTE — Telephone Encounter (Signed)
 Haloperidol  last prescribed (12/31/2023 9:30 AM EDT) and Olanzapine  last prescribed (08/11/2023  2:30 PM EST) by Harl Zane BRAVO, NP at Psychiatry. Request refills from the same. During the interim report to the Emergency Department/Urgent Care/call 911 for immediate medical evaluation.

## 2024-05-12 NOTE — Telephone Encounter (Signed)
 Pt called Cher Mulligan office requesting refills. Pt last visit was on 08/11/23 and she has no future appointment scheduled.

## 2024-05-12 NOTE — Telephone Encounter (Signed)
 Requested medication (s) are due for refill today: yes  Requested medication (s) are on the active medication list: yes  Last refill:  12/31/23  Future visit scheduled: {Yes, 08/25/24  Notes to clinic:  Unable to refill per protocol, cannot delegate.      Requested Prescriptions  Pending Prescriptions Disp Refills   haloperidol  (HALDOL ) 1 MG tablet 30 tablet 3     Not Delegated - Psychiatry: Antipsychotics - First Generation (Typical) - haloperidol  Failed - 05/12/2024  9:56 AM      Failed - This refill cannot be delegated      Failed - Valid encounter within last 6 months    Recent Outpatient Visits           1 year ago Primary hypertension   [place] [facility], [place], [place]   2 years ago Essential hypertension   [place] [facility], [place], [place]   2 years ago Essential hypertension   [facility] [place] T[facility] Garnette LITTIE, RPH-CPP   2 years ago Essential hypertension   [place] [facility], [place] RAMAN, NP   2 years ago Essential hypertension   [place] [facility], [place] J, NP              Failed - Lipid Panel in normal range within the last 12 months    Cholesterol  Date Value Ref Range Status  05/06/2018 203 (H) 0 - 200 mg/dL Final   LDL Cholesterol  Date Value Ref Range Status  05/06/2018 137 (H) 0 - 99 mg/dL Final    Comment:           Total Cholesterol/HDL:CHD Risk Coronary Heart Disease Risk Table                     Men   Women  1/2 Average Risk   3.4   3.3  Average Risk       5.0   4.4  2 X Average Risk   9.6   7.1  3 X Average Risk  23.4   11.0        Use the calculated Patient Ratio above and the CHD Risk Table to determine the patient's CHD Risk.        ATP III CLASSIFICATION (LDL):  <100     mg/dL   Optimal  899-870  mg/dL   Near or Above                    Optimal   130-159  mg/dL   Borderline  839-810  mg/dL   High  >809     mg/dL   Very High Performed at [facility], [address], [place], [place] [address]    HDL  Date Value Ref Range Status  05/06/2018 51 >40 mg/dL Final   Triglycerides  Date Value Ref Range Status  05/06/2018 73 <150 mg/dL Final         Failed - CBC within normal limits and completed in the last 12 months    WBC  Date Value Ref Range Status  03/24/2023 6.9 4.0 - 10.5 K/uL Final   RBC  Date Value Ref Range Status  03/24/2023 4.65 3.87 - 5.11 MIL/uL Final   Hemoglobin  Date Value Ref Range Status  03/24/2023 13.8 12.0 -  15.0 g/dL Final  91/97/7977 87.4 11.1 - 15.9 g/dL Final   HCT  Date Value Ref Range Status  03/24/2023 42.2 36.0 - 46.0 % Final   Hematocrit  Date Value Ref Range Status  03/27/2021 37.5 34.0 - 46.6 % Final   MCHC  Date Value Ref Range Status  03/24/2023 32.7 30.0 - 36.0 g/dL Final   [facility]  Date Value Ref Range Status  03/24/2023 29.7 26.0 - 34.0 pg Final   MCV  Date Value Ref Range Status  03/24/2023 90.8 80.0 - 100.0 fL Final  03/27/2021 91 79 - 97 fL Final   No results found for: PLTCOUNTKUC, LABPLAT, POCPLA RDW  Date Value Ref Range Status  03/24/2023 13.0 11.5 - 15.5 % Final  03/27/2021 15.2 11.7 - 15.4 % Final         Failed - CMP within normal limits and completed in the last 12 months    Albumin  Date Value Ref Range Status  03/11/2021 2.9 (L) 3.5 - 5.0 g/dL Final   Alkaline Phosphatase  Date Value Ref Range Status  03/11/2021 53 38 - 126 U/L Final   ALT  Date Value Ref Range Status  03/11/2021 18 0 - 44 U/L Final   AST  Date Value Ref Range Status  03/11/2021 16 15 - 41 U/L Final   BUN  Date Value Ref Range Status  05/05/2023 11 8 - 27 mg/dL Final   Calcium   Date Value Ref Range Status  05/05/2023 8.5 (L) 8.7 - 10.3 mg/dL Final   CO2  Date Value Ref Range Status  05/05/2023 21 20 - 29 mmol/L Final   Creatinine, Ser  Date  Value Ref Range Status  05/05/2023 0.94 0.57 - 1.00 mg/dL Final   Creatinine, Urine  Date Value Ref Range Status  03/05/2021 101.49 mg/dL Final    Comment:    Performed at [facility], [address], [place], [place] [address]   Glucose  Date Value Ref Range Status  05/05/2023 78 70 - 99 mg/dL Final   Glucose, Bld  Date Value Ref Range Status  03/24/2023 85 70 - 99 mg/dL Final    Comment:    Glucose reference range applies only to samples taken after fasting for at least 8 hours.   Glucose-Capillary  Date Value Ref Range Status  03/13/2021 103 (H) 70 - 99 mg/dL Final    Comment:    Glucose reference range applies only to samples taken after fasting for at least 8 hours.   Potassium  Date Value Ref Range Status  05/05/2023 4.0 3.5 - 5.2 mmol/L Final   Sodium  Date Value Ref Range Status  05/05/2023 143 134 - 144 mmol/L Final   Total Bilirubin  Date Value Ref Range Status  03/11/2021 0.5 0.3 - 1.2 mg/dL Final   Protein, ur  Date Value Ref Range Status  03/24/2023 NEGATIVE NEGATIVE mg/dL Final   Total Protein  Date Value Ref Range Status  03/11/2021 6.0 (L) 6.5 - 8.1 g/dL Final   GFR calc Af Amer  Date Value Ref Range Status  04/04/2020 >60 >60 mL/min Final   eGFR  Date Value Ref Range Status  05/05/2023 69 >59 mL/min/1.73 Final   GFR, Estimated  Date Value Ref Range Status  03/24/2023 >60 >60 mL/min Final    Comment:    (NOTE) Calculated using the CKD-EPI Creatinine Equation (2021)          Passed - Completed PHQ-2 or PHQ-9 in the last  360 days      Passed - Last BP in normal range    BP Readings from Last 1 Encounters:  05/05/23 129/87         Passed - Last Heart Rate in normal range    Pulse Readings from Last 1 Encounters:  05/05/23 61          OLANZapine  (ZYPREXA ) 20 MG tablet 30 tablet 3    Sig: Take 1 tablet (20 mg total) by mouth at bedtime.     Not Delegated - Psychiatry:  Antipsychotics - Second Generation  (Atypical) - olanzapine  Failed - 05/12/2024  9:56 AM      Failed - This refill cannot be delegated      Failed - TSH in normal range and within 360 days    TSH  Date Value Ref Range Status  02/27/2021 3.501 0.350 - 4.500 uIU/mL Final    Comment:    Performed by a 3rd Generation assay with a functional sensitivity of <=0.01 uIU/mL. Performed at [facility], [address] [place], [place] [address]          Failed - Valid encounter within last 6 months    Recent Outpatient Visits           1 year ago Primary hypertension   [facility], [place]   2 years ago Essential hypertension   [place] [facility], [place]   2 years ago Essential hypertension   [facility] [place]. [facility] Tonia Garnette LITTIE, RPH-CPP   2 years ago Essential hypertension   [place] [facility], [place] RAMAN, NP   2 years ago Essential hypertension   [place] [facility], [place] J, NP              Failed - Lipid Panel in normal range within the last 12 months    Cholesterol  Date Value Ref Range Status  05/06/2018 203 (H) 0 - 200 mg/dL Final   LDL Cholesterol  Date Value Ref Range Status  05/06/2018 137 (H) 0 - 99 mg/dL Final    Comment:           Total Cholesterol/HDL:CHD Risk Coronary Heart Disease Risk Table                     Men   Women  1/2 Average Risk   3.4   3.3  Average Risk       5.0   4.4  2 X Average Risk   9.6   7.1  3 X Average Risk  23.4   11.0        Use the calculated Patient Ratio above and the CHD Risk Table to determine the patient's CHD Risk.        ATP III CLASSIFICATION (LDL):  <100     mg/dL   Optimal  899-870  mg/dL   Near or Above                    Optimal  130-159  mg/dL   Borderline  839-810  mg/dL   High  >809     mg/dL   Very High Performed at [facility], [address] [place], [place] [address]    HDL  Date Value Ref Range  Status  05/06/2018 51 >40 mg/dL Final   Triglycerides  Date Value Ref Range Status  05/06/2018 73 <150 mg/dL Final         Failed - CBC within normal limits and completed in the last 12 months    WBC  Date Value Ref Range Status  03/24/2023 6.9 4.0 - 10.5 K/uL Final   RBC  Date Value Ref Range Status  03/24/2023 4.65 3.87 - 5.11 MIL/uL Final   Hemoglobin  Date Value Ref Range Status  03/24/2023 13.8 12.0 - 15.0 g/dL Final  91/97/7977 87.4 11.1 - 15.9 g/dL Final   HCT  Date Value Ref Range Status  03/24/2023 42.2 36.0 - 46.0 % Final   Hematocrit  Date Value Ref Range Status  03/27/2021 37.5 34.0 - 46.6 % Final   MCHC  Date Value Ref Range Status  03/24/2023 32.7 30.0 - 36.0 g/dL Final   [facility]  Date Value Ref Range Status  03/24/2023 29.7 26.0 - 34.0 pg Final   MCV  Date Value Ref Range Status  03/24/2023 90.8 80.0 - 100.0 fL Final  03/27/2021 91 79 - 97 fL Final   No results found for: PLTCOUNTKUC, LABPLAT, POCPLA RDW  Date Value Ref Range Status  03/24/2023 13.0 11.5 - 15.5 % Final  03/27/2021 15.2 11.7 - 15.4 % Final         Failed - CMP within normal limits and completed in the last 12 months    Albumin  Date Value Ref Range Status  03/11/2021 2.9 (L) 3.5 - 5.0 g/dL Final   Alkaline Phosphatase  Date Value Ref Range Status  03/11/2021 53 38 - 126 U/L Final   ALT  Date Value Ref Range Status  03/11/2021 18 0 - 44 U/L Final   AST  Date Value Ref Range Status  03/11/2021 16 15 - 41 U/L Final   BUN  Date Value Ref Range Status  05/05/2023 11 8 - 27 mg/dL Final   Calcium   Date Value Ref Range Status  05/05/2023 8.5 (L) 8.7 - 10.3 mg/dL Final   CO2  Date Value Ref Range Status  05/05/2023 21 20 - 29 mmol/L Final   Creatinine, Ser  Date Value Ref Range Status  05/05/2023 0.94 0.57 - 1.00 mg/dL Final   Creatinine, Urine  Date Value Ref  Range Status  03/05/2021 101.49 mg/dL Final    Comment:    Performed at [facility], [address], [place], [place] [address]   Glucose  Date Value Ref Range Status  05/05/2023 78 70 - 99 mg/dL Final   Glucose, Bld  Date Value Ref Range Status  03/24/2023 85 70 - 99 mg/dL Final    Comment:    Glucose reference range applies only to samples taken after fasting for at least 8 hours.   Glucose-Capillary  Date Value Ref Range Status  03/13/2021 103 (H) 70 - 99 mg/dL Final    Comment:    Glucose reference range applies only to samples taken after fasting for at least 8 hours.   Potassium  Date Value Ref Range Status  05/05/2023 4.0 3.5 - 5.2 mmol/L Final   Sodium  Date Value Ref Range Status  05/05/2023 143 134 - 144 mmol/L Final   Total Bilirubin  Date Value Ref Range Status  03/11/2021 0.5 0.3 - 1.2 mg/dL Final   Protein, ur  Date Value Ref Range Status  03/24/2023 NEGATIVE NEGATIVE mg/dL Final   Total Protein  Date Value Ref Range  Status  03/11/2021 6.0 (L) 6.5 - 8.1 g/dL Final   GFR calc Af Amer  Date Value Ref Range Status  04/04/2020 >60 >60 mL/min Final   eGFR  Date Value Ref Range Status  05/05/2023 69 >59 mL/min/1.73 Final   GFR, Estimated  Date Value Ref Range Status  03/24/2023 >60 >60 mL/min Final    Comment:    (NOTE) Calculated using the CKD-EPI Creatinine Equation (2021)          Passed - Completed PHQ-2 or PHQ-9 in the last 360 days      Passed - Last BP in normal range    BP Readings from Last 1 Encounters:  05/05/23 129/87         Passed - Last Heart Rate in normal range    Pulse Readings from Last 1 Encounters:  05/05/23 61

## 2024-05-12 NOTE — Telephone Encounter (Signed)
 Patient called [facility]ack and was advise to call Brittney Parson,NP for her special medication  refill of  Haloperidol  last prescribed (12/31/2023 9:30 AM EDT) and Olanzapine  . Patient given Brittney number

## 2024-05-13 NOTE — Telephone Encounter (Signed)
 Patient will need an appointment prior to medication refills.

## 2024-05-26 ENCOUNTER — Other Ambulatory Visit (HOSPITAL_COMMUNITY): Payer: Self-pay | Admitting: Psychiatry

## 2024-05-26 DIAGNOSIS — F250 Schizoaffective disorder, bipolar type: Secondary | ICD-10-CM

## 2024-05-27 ENCOUNTER — Telehealth: Payer: Self-pay

## 2024-05-27 ENCOUNTER — Other Ambulatory Visit: Payer: Self-pay | Admitting: Family

## 2024-05-27 ENCOUNTER — Ambulatory Visit: Payer: Self-pay

## 2024-05-27 ENCOUNTER — Telehealth: Payer: Self-pay | Admitting: Emergency Medicine

## 2024-05-27 ENCOUNTER — Ambulatory Visit: Payer: Self-pay | Admitting: Family

## 2024-05-27 DIAGNOSIS — F411 Generalized anxiety disorder: Secondary | ICD-10-CM

## 2024-05-27 DIAGNOSIS — F5105 Insomnia due to other mental disorder: Secondary | ICD-10-CM

## 2024-05-27 DIAGNOSIS — F250 Schizoaffective disorder, bipolar type: Secondary | ICD-10-CM

## 2024-05-27 NOTE — Telephone Encounter (Signed)
 Call to patient and patient's daughter. Call was to advised the patient needs to contact prescribing  MD for refill for requested medication. Unable to reach VM left

## 2024-05-27 NOTE — Telephone Encounter (Signed)
 FYI Only or Action Required?: Action required by provider: medication refill request.  Patient was last seen in primary care on 05/05/2023 by Lorren Greig PARAS, NP.  Called Nurse Triage reporting Advice Only.  Triage Disposition: Call PCP When Office is Open  Patient/caregiver understands and will follow disposition?: Yes                  Reason for Disposition  [1] Caller requesting NON-URGENT health information AND [2] PCP's office is the best resource  Answer Assessment - Initial Assessment Questions This RN spoke with pt and pt's daughter, Karna. The pt is not going to be able to get in to see a psychiatrist until next month per pt daughter. At this time, pt does not have an appt with psych.   Pt has been without the following medications and needs them per pt daughter. Pt daughter is requesting a refill of the following medications by PCP until she can get in with a psychiatrist.   Pt daughter call back number is [phone]   OLANZapine  (ZYPREXA ) 20 MG tablet [[id number]]   doxepin  (SINEQUAN ) 150 MG capsule [[id number]]   haloperidol  (HALDOL ) 1 MG tablet [[id number]]   Pharmacy- [facility] in [place] [phone]  Protocols used: Information Only Call - No Triage-A-AH

## 2024-05-27 NOTE — Telephone Encounter (Signed)
 Patient daughter called in however she was not on the release of information form so I could not speak with her

## 2024-05-27 NOTE — Telephone Encounter (Signed)
 This was incorrectly routed to [facility]    Copied from CRM #[id number]. Topic: Clinical - Prescription Issue >> May 26, 2024  3:41 PM Santiya F wrote: Reason for CRM: Patient's daughter Karna is calling in because patient is out of her medication and she is schizophrenic and hasn't had her medication in two weeks. She says that patient has episodes when she's off her medication and she flips out and hears voices to the point they have to have her committed. She says she was dropped from her psychiatrist for missing appointments and Karna says she can get her reestablished but she needs enough medication until she can do so. She wants to know if Dr. Lang can refill enough medication to get her through until she can find a psychiatrist.  OLANZapine  (ZYPREXA ) 20 MG tablet [[id number]]  doxepin  (SINEQUAN ) 150 MG capsule [[id number]]  haloperidol  (HALDOL ) 1 MG tablet [[id number]]  Pharmacy- [facility] in [place] [phone]

## 2024-05-27 NOTE — Telephone Encounter (Signed)
 Copied from CRM #[id number]. Topic: Clinical - Prescription Issue >> May 26, 2024  3:41 PM Santiya F wrote: Reason for CRM: Patient's daughter Karna is calling in because patient is out of her medication and she is schizophrenic and hasn't had her medication in two weeks. She says that patient has episodes when she's off her medication and she flips out and hears voices to the point they have to have her committed. She says she was dropped from her psychiatrist for missing appointments and Karna says she can get her reestablished but she needs enough medication until she can do so. She wants to know if Dr. Lang can refill enough medication to get her through until she can find a psychiatrist.  OLANZapine  (ZYPREXA ) 20 MG tablet [[id number]]  doxepin  (SINEQUAN ) 150 MG capsule [[id number]]  haloperidol  (HALDOL ) 1 MG tablet [[id number]]  Pharmacy- [facility] in [place] [phone]

## 2024-05-27 NOTE — Telephone Encounter (Signed)
 FYI Only or Action Required?: Action required by provider: medication refill request and needs enough medication until she sees her psychiatrist..  Patient was last seen in primary care on 05/05/2023 by Lorren Greig PARAS, NP.  Called Nurse Triage reporting No chief complaint on file..  Symptoms began yesterday.  Interventions attempted: Nothing.  Symptoms are: gradually worsening.  Triage Disposition: No disposition on file.  Patient/caregiver understands and will follow disposition?:      Copied from CRM [id number]. Topic: Clinical - Prescription Issue >> May 26, 2024  3:41 PM Santiya F wrote: Reason for CRM: Patient's daughter Karna is calling in because patient is out of her medication and she is schizophrenic and hasn't had her medication in two weeks. She says that patient has episodes when she's off her medication and she flips out and hears voices to the point they have to have her committed. She says she was dropped from her psychiatrist for missing appointments and Karna says she can get her reestablished but she needs enough medication until she can do so. She wants to know if Dr. Lang can refill enough medication to get her through until she can find a psychiatrist.  OLANZapine  (ZYPREXA ) 20 MG tablet [[id number]]  doxepin  (SINEQUAN ) 150 MG capsule [[id number]]  haloperidol  (HALDOL ) 1 MG tablet [[id number]]  Pharmacy- [facility] in [place] [phone] Reason for Disposition  [1] Prescription prescribed recently is not at pharmacy AND [2] triager has access to patient's EMR AND [3] prescription is recorded in the EMR  Answer Assessment - Initial Assessment Questions 1. DRUG NAME: What medicine do you need to have refilled?     Zyprexa , doxepin , haloperidol  2. REFILLS REMAINING: How many refills are remaining? Notes: The label on the medicine or pill bottle will show how many refills are remaining. If there are no refills remaining, then a renewal may be needed.     0  and her psychiatrist dropped her and she needs to find a new one.  Protocols used: Medication Refill and Renewal Call-A-AH

## 2024-05-28 ENCOUNTER — Telehealth: Payer: Self-pay | Admitting: Family

## 2024-05-28 ENCOUNTER — Other Ambulatory Visit: Payer: Self-pay | Admitting: Family

## 2024-05-28 ENCOUNTER — Other Ambulatory Visit: Payer: Self-pay | Admitting: Family Medicine

## 2024-05-28 DIAGNOSIS — F411 Generalized anxiety disorder: Secondary | ICD-10-CM

## 2024-05-28 DIAGNOSIS — F5105 Insomnia due to other mental disorder: Secondary | ICD-10-CM

## 2024-05-28 DIAGNOSIS — F250 Schizoaffective disorder, bipolar type: Secondary | ICD-10-CM

## 2024-05-28 MED ORDER — HALOPERIDOL 1 MG PO TABS: ORAL_TABLET | ORAL | 0 refills | Status: AC

## 2024-05-28 MED ORDER — DOXEPIN HCL 150 MG PO CAPS: 150.0000 mg | ORAL_CAPSULE | Freq: Every day | ORAL | 0 refills | Status: AC

## 2024-05-28 NOTE — Telephone Encounter (Unsigned)
 Copied from CRM #[id number]. Topic: Referral - Request for Referral >> May 28, 2024 11:32 AM Zebedee SAUNDERS wrote: Did the patient discuss referral with their provider in the last year? Yes (If No - schedule appointment) (If Yes - send message)  Appointment offered? Yes  Type of order/referral and detailed reason for visit: Psychiatrist that [facility]  Preference of office, provider, location: Pt's area for [facility]  If referral order, have you been seen by this specialty before? Yes (If Yes, this issue or another issue? When? Where?  Can we respond through MyChart? Yes

## 2024-05-28 NOTE — Telephone Encounter (Signed)
 Copied from CRM [id number]. Topic: Clinical - Medication Refill >> May 28, 2024 11:16 AM Zebedee SAUNDERS wrote: Medication: OLANZapine  (ZYPREXA ) 20 MG tablet , doxepin  (SINEQUAN ) 150 MG capsule , haloperidol  (HALDOL ) 1 MG tablet   Has the patient contacted their pharmacy? Yes (Agent: If no, request that the patient contact the pharmacy for the refill. If patient does not wish to contact the pharmacy document the reason why and proceed with request.) (Agent: If yes, when and what did the pharmacy advise?)Pharmacy need script from PCP.   This is the patient's preferred pharmacy:  [id number] - [place], [place] - [address] AT [facility] [facility] OF [address] & [address] [place] [address] Phone: [phone] Fax: [phone]  Is this the correct pharmacy for this prescription? Yes If no, delete pharmacy and type the correct one.   Has the prescription been filled recently? Yes  Is the patient out of the medication? Yes  Has the patient been seen for an appointment in the last year OR does the patient have an upcoming appointment? Yes  Can we respond through MyChart? Yes  Agent: Please be advised that Rx refills may take up to 3 business days. We ask that you follow-up with your pharmacy.

## 2024-05-28 NOTE — Telephone Encounter (Signed)
 Called patient to let her aware medication has been sent in.[email or web address]

## 2024-05-28 NOTE — Telephone Encounter (Signed)
 Copied from CRM [id number]. Topic: Clinical - Medication Refill >> May 28, 2024 11:16 AM Zebedee SAUNDERS wrote: Medication: OLANZapine  (ZYPREXA ) 20 MG tablet , doxepin  (SINEQUAN ) 150 MG capsule , haloperidol  (HALDOL ) 1 MG tablet   Has the patient contacted their pharmacy? Yes (Agent: If no, request that the patient contact the pharmacy for the refill. If patient does not wish to contact the pharmacy document the reason why and proceed with request.) (Agent: If yes, when and what did the pharmacy advise?)Pharmacy need script from PCP.   This is the patient's preferred pharmacy:  [id number] - [place], [place] - [address] AT [address] & [address] [place] [address] Phone: [phone] Fax: [phone]  Is this the correct pharmacy for this prescription? Yes If no, delete pharmacy and type the correct one.   Has the prescription been filled recently? Yes  Is the patient out of the medication? Yes  Has the patient been seen for an appointment in the last year OR does the patient have an upcoming appointment? Yes  Can we respond through MyChart? Yes  Agent: Please be advised that Rx refills may take up to 3 business days. We ask that you follow-up with your pharmacy. >> May 28, 2024 11:30 AM Zebedee SAUNDERS wrote:   Pt's daughter Claudene Shoe [phone] returing Delores Calton FALCON, RN call to let you know that pt's does not have doctor's appt for today. Daughter is still looking for psychiatrist for pt. But Amy Lorren has been filling pt's medication. Please call Ms. Claudene [phone].

## 2024-05-31 NOTE — Telephone Encounter (Signed)
 Requested medication (s) are due for refill today: olanzaprine  Requested medication (s) are on the active medication list: yes  Last refill:   Olanzapine : 08/11/23 #30 3 RF       Doxepin : 05/28/24 #15 Haldol : 05/28/24 #15  Future visit scheduled: yes  Notes to clinic:  all meds not delegated to NT to RF/overdue lab work    Requested Prescriptions  Pending Prescriptions Disp Refills   OLANZapine  (ZYPREXA ) 20 MG tablet 30 tablet 3    Sig: Take 1 tablet (20 mg total) by mouth at bedtime.     Not Delegated - Psychiatry:  Antipsychotics - Second Generation (Atypical) - olanzapine  Failed - 05/31/2024  7:45 AM      Failed - This refill cannot be delegated      Failed - TSH in normal range and within 360 days    TSH  Date Value Ref Range Status  02/27/2021 3.501 0.350 - 4.500 uIU/mL Final    Comment:    Performed by a 3rd Generation assay with a functional sensitivity of <=0.01 uIU/mL. Performed at [facility], [address], [place], [place] [address]          Failed - Valid encounter within last 6 months    Recent Outpatient Visits           1 year ago Primary hypertension   [place] [facility], [place], [place]   2 years ago Essential hypertension   [place] [facility], [place], [place]   2 years ago Essential hypertension   [facility] [place] [place] Tonia Garnette LITTIE, RPH-CPP   2 years ago Essential hypertension   [place] [facility], [place] RAMAN, NP   2 years ago Essential hypertension   [place] [facility], [place] J, NP              Failed - Lipid Panel in normal range within the last 12 months    Cholesterol  Date Value Ref Range Status  05/06/2018 203 (H) 0 - 200 mg/dL Final   LDL Cholesterol  Date Value Ref Range Status  05/06/2018 137 (H) 0 - 99 mg/dL Final    Comment:            Total Cholesterol/HDL:CHD Risk Coronary Heart Disease Risk Table                     Men   Women  1/2 Average Risk   3.4   3.3  Average Risk       5.0   4.4  2 X Average Risk   9.6   7.1  3 X Average Risk  23.4   11.0        Use the calculated Patient Ratio above and the CHD Risk Table to determine the patient's CHD Risk.        ATP III CLASSIFICATION (LDL):  <100     mg/dL   Optimal  899-870  mg/dL   Near or Above                    Optimal  130-159  mg/dL   Borderline  839-810  mg/dL   High  >809     mg/dL   Very High Performed at [facility], [address] [place], [place]  [address]    HDL  Date Value Ref Range Status  05/06/2018 51 >40 mg/dL Final   Triglycerides  Date Value Ref Range Status  05/06/2018 73 <150 mg/dL Final         Failed - CBC within normal limits and completed in the last 12 months    WBC  Date Value Ref Range Status  03/24/2023 6.9 4.0 - 10.5 K/uL Final   RBC  Date Value Ref Range Status  03/24/2023 4.65 3.87 - 5.11 MIL/uL Final   Hemoglobin  Date Value Ref Range Status  03/24/2023 13.8 12.0 - 15.0 g/dL Final  91/97/7977 87.4 11.1 - 15.9 g/dL Final   HCT  Date Value Ref Range Status  03/24/2023 42.2 36.0 - 46.0 % Final   Hematocrit  Date Value Ref Range Status  03/27/2021 37.5 34.0 - 46.6 % Final   MCHC  Date Value Ref Range Status  03/24/2023 32.7 30.0 - 36.0 g/dL Final   [facility]  Date Value Ref Range Status  03/24/2023 29.7 26.0 - 34.0 pg Final   MCV  Date Value Ref Range Status  03/24/2023 90.8 80.0 - 100.0 fL Final  03/27/2021 91 79 - 97 fL Final   No results found for: PLTCOUNTKUC, LABPLAT, POCPLA RDW  Date Value Ref Range Status  03/24/2023 13.0 11.5 - 15.5 % Final  03/27/2021 15.2 11.7 - 15.4 % Final         Failed - CMP within normal limits and completed in the last 12 months    Albumin  Date Value Ref Range Status  03/11/2021 2.9 (L) 3.5 - 5.0 g/dL Final   Alkaline Phosphatase   Date Value Ref Range Status  03/11/2021 53 38 - 126 U/L Final   ALT  Date Value Ref Range Status  03/11/2021 18 0 - 44 U/L Final   AST  Date Value Ref Range Status  03/11/2021 16 15 - 41 U/L Final   BUN  Date Value Ref Range Status  05/05/2023 11 8 - 27 mg/dL Final   Calcium   Date Value Ref Range Status  05/05/2023 8.5 (L) 8.7 - 10.3 mg/dL Final   CO2  Date Value Ref Range Status  05/05/2023 21 20 - 29 mmol/L Final   Creatinine, Ser  Date Value Ref Range Status  05/05/2023 0.94 0.57 - 1.00 mg/dL Final   Creatinine, Urine  Date Value Ref Range Status  03/05/2021 101.49 mg/dL Final    Comment:    Performed at [facility], [address] [place], [place] [address]   Glucose  Date Value Ref Range Status  05/05/2023 78 70 - 99 mg/dL Final   Glucose, Bld  Date Value Ref Range Status  03/24/2023 85 70 - 99 mg/dL Final    Comment:    Glucose reference range applies only to samples taken after fasting for at least 8 hours.   Glucose-Capillary  Date Value Ref Range Status  03/13/2021 103 (H) 70 - 99 mg/dL Final    Comment:    Glucose reference range applies only to samples taken after fasting for at least 8 hours.   Potassium  Date Value Ref Range Status  05/05/2023 4.0 3.5 - 5.2 mmol/L Final   Sodium  Date Value Ref Range Status  05/05/2023 143 134 - 144 mmol/L Final   Total Bilirubin  Date Value Ref Range Status  03/11/2021 0.5 0.3 - 1.2 mg/dL Final   Protein, ur  Date Value Ref Range Status  03/24/2023 NEGATIVE NEGATIVE mg/dL  Final   Total Protein  Date Value Ref Range Status  03/11/2021 6.0 (L) 6.5 - 8.1 g/dL Final   GFR calc Af Amer  Date Value Ref Range Status  04/04/2020 >60 >60 mL/min Final   eGFR  Date Value Ref Range Status  05/05/2023 69 >59 mL/min/1.73 Final   GFR, Estimated  Date Value Ref Range Status  03/24/2023 >60 >60 mL/min Final    Comment:    (NOTE) Calculated using the CKD-EPI Creatinine Equation  (2021)          Passed - Completed PHQ-2 or PHQ-9 in the last 360 days      Passed - Last BP in normal range    BP Readings from Last 1 Encounters:  05/05/23 129/87         Passed - Last Heart Rate in normal range    Pulse Readings from Last 1 Encounters:  05/05/23 61          doxepin  (SINEQUAN ) 150 MG capsule 15 capsule 0    Sig: Take 1 capsule (150 mg total) by mouth at bedtime.     Psychiatry:  Antidepressants - Heterocyclics (TCAs) Failed - 05/31/2024  7:45 AM      Failed - Valid encounter within last 6 months    Recent Outpatient Visits           1 year ago Primary hypertension   [place] [facility], [place], [place]   2 years ago Essential hypertension   [place] [facility], [place], [place]   2 years ago Essential hypertension   [facility] [place] Fle[place] Tonia Garnette LITTIE, RPH-CPP   2 years ago Essential hypertension   [place] [facility] at [facility], [place] RAMAN, NP   2 years ago Essential hypertension   [place] [facility] at [facility], [place], [place]              Passed - Completed PHQ-2 or PHQ-9 in the last 360 days     Psychiatry:  Antidepressants - Heterocyclics (TCAs) - doxepin  Failed - 05/31/2024  7:45 AM      Failed - Valid encounter within last 12 months    Recent Outpatient Visits           1 year ago Primary hypertension   [place] [facility] at [facility], [place], [place]   2 years ago Essential hypertension   [place] [facility], [place], [place]   2 years ago Essential hypertension   [facility] [place]. [facility] Fle[place] Tonia Garnette LITTIE, RPH-CPP   2 years ago Essential hypertension   [place] [facility], [place] RAMAN, NP   2 years ago Essential hypertension    [facility], [place] J, NP               haloperidol  (HALDOL ) 1 MG tablet 15 tablet 0    Sig: TAKE 1 TABLET(1 MG) BY MOUTH AT BEDTIME     Not Delegated - Psychiatry: Antipsychotics - First Generation (Typical) - haloperidol  Failed - 05/31/2024  7:45 AM      Failed - This refill cannot be delegated      Failed - Valid encounter within last 6 months  Recent Outpatient Visits           1 year ago Primary hypertension   [place] [facility] at [facility], [place], [place]   2 years ago Essential hypertension   [place] [facility] at [facility], [place], [place]   2 years ago Essential hypertension   [facility] [place]. [facility] [place] Tonia Garnette LITTIE, RPH-CPP   2 years ago Essential hypertension   [place] [facility] at [facility], [place] RAM[place], NP   2 years ago Essential hypertension   [place] [facility], [place] J, NP              Failed - Lipid Panel in normal range within the last 12 months    Cholesterol  Date Value Ref Range Status  05/06/2018 203 (H) 0 - 200 mg/dL Final   LDL Cholesterol  Date Value Ref Range Status  05/06/2018 137 (H) 0 - 99 mg/dL Final    Comment:           Total Cholesterol/HDL:CHD Risk Coronary Heart Disease Risk Table                     Men   Women  1/2 Average Risk   3.4   3.3  Average Risk       5.0   4.4  2 X Average Risk   9.6   7.1  3 X Average Risk  23.4   11.0        Use the calculated Patient Ratio above and the CHD Risk Table to determine the patient's CHD Risk.        ATP III CLASSIFICATION (LDL):  <100     mg/dL   Optimal  899-870  mg/dL   Near or Above                    Optimal  130-159  mg/dL   Borderline  839-810  mg/dL   High  >809     mg/dL   Very High Performed at [facility], [address] [place], [place] [address]    HDL  Date Value Ref Range Status  05/06/2018 51 >40  mg/dL Final   Triglycerides  Date Value Ref Range Status  05/06/2018 73 <150 mg/dL Final         Failed - CBC within normal limits and completed in the last 12 months    WBC  Date Value Ref Range Status  03/24/2023 6.9 4.0 - 10.5 K/uL Final   RBC  Date Value Ref Range Status  03/24/2023 4.65 3.87 - 5.11 MIL/uL Final   Hemoglobin  Date Value Ref Range Status  03/24/2023 13.8 12.0 - 15.0 g/dL Final  91/97/7977 87.4 11.1 - 15.9 g/dL Final   HCT  Date Value Ref Range Status  03/24/2023 42.2 36.0 - 46.0 % Final   Hematocrit  Date Value Ref Range Status  03/27/2021 37.5 34.0 - 46.6 % Final   MCHC  Date Value Ref Range Status  03/24/2023 32.7 30.0 - 36.0 g/dL Final   [facility]  Date Value Ref Range Status  03/24/2023 29.7 26.0 - 34.0 pg Final   MCV  Date Value Ref Range Status  03/24/2023 90.8 80.0 - 100.0 fL Final  03/27/2021 91 79 - 97 fL Final   No results found for: PLTCOUNTKUC, LABPLAT, POCPLA RDW  Date Value Ref Range Status  03/24/2023 13.0 11.5 - 15.5 % Final  03/27/2021 15.2 11.7 - 15.4 % Final         Failed - CMP within normal limits and completed in the last 12 months    Albumin  Date Value Ref Range Status  03/11/2021 2.9 (L) 3.5 - 5.0 g/dL Final   Alkaline Phosphatase  Date Value Ref Range Status  03/11/2021 53 38 - 126 U/L Final   ALT  Date Value Ref Range Status  03/11/2021 18 0 - 44 U/L Final   AST  Date Value Ref Range Status  03/11/2021 16 15 - 41 U/L Final   BUN  Date Value Ref Range Status  05/05/2023 11 8 - 27 mg/dL Final   Calcium   Date Value Ref Range Status  05/05/2023 8.5 (L) 8.7 - 10.3 mg/dL Final   CO2  Date Value Ref Range Status  05/05/2023 21 20 - 29 mmol/L Final   Creatinine, Ser  Date Value Ref Range Status  05/05/2023 0.94 0.57 - 1.00 mg/dL Final   Creatinine, Urine  Date Value Ref Range Status  03/05/2021 101.49 mg/dL Final    Comment:    Performed at [facility], [address] [place], [place] [address]   Glucose  Date Value Ref Range Status  05/05/2023 78 70 - 99 mg/dL Final   Glucose, Bld  Date Value Ref Range Status  03/24/2023 85 70 - 99 mg/dL Final    Comment:    Glucose reference range applies only to samples taken after fasting for at least 8 hours.   Glucose-Capillary  Date Value Ref Range Status  03/13/2021 103 (H) 70 - 99 mg/dL Final    Comment:    Glucose reference range applies only to samples taken after fasting for at least 8 hours.   Potassium  Date Value Ref Range Status  05/05/2023 4.0 3.5 - 5.2 mmol/L Final   Sodium  Date Value Ref Range Status  05/05/2023 143 134 - 144 mmol/L Final   Total Bilirubin  Date Value Ref Range Status  03/11/2021 0.5 0.3 - 1.2 mg/dL Final   Protein, ur  Date Value Ref Range Status  03/24/2023 NEGATIVE NEGATIVE mg/dL Final   Total Protein  Date Value Ref Range Status  03/11/2021 6.0 (L) 6.5 - 8.1 g/dL Final   GFR calc Af Amer  Date Value Ref Range Status  04/04/2020 >60 >60 mL/min Final   eGFR  Date Value Ref Range Status  05/05/2023 69 >59 mL/min/1.73 Final   GFR, Estimated  Date Value Ref Range Status  03/24/2023 >60 >60 mL/min Final    Comment:    (NOTE) Calculated using the CKD-EPI Creatinine Equation (2021)          Passed - Completed PHQ-2 or PHQ-9 in the last 360 days      Passed - Last BP in normal range    BP Readings from Last 1 Encounters:  05/05/23 129/87         Passed - Last Heart Rate in normal range    Pulse Readings from Last 1 Encounters:  05/05/23 61

## 2024-08-04 ENCOUNTER — Encounter: Payer: Self-pay | Admitting: Family

## 2024-08-25 ENCOUNTER — Encounter: Payer: MEDICAID | Admitting: Family
